# Patient Record
Sex: Female | Born: 1965 | Race: White | Hispanic: No | Marital: Married | Smoking: Former smoker
Health system: Southern US, Community
[De-identification: ages and names within clinical notes are randomized; demographics above are authoritative.]

## PROBLEM LIST (undated history)

## (undated) DIAGNOSIS — C50919 Malignant neoplasm of unspecified site of unspecified female breast: Secondary | ICD-10-CM

## (undated) DIAGNOSIS — F419 Anxiety disorder, unspecified: Secondary | ICD-10-CM

## (undated) DIAGNOSIS — I639 Cerebral infarction, unspecified: Secondary | ICD-10-CM

## (undated) DIAGNOSIS — I10 Essential (primary) hypertension: Secondary | ICD-10-CM

## (undated) DIAGNOSIS — Z923 Personal history of irradiation: Secondary | ICD-10-CM

## (undated) DIAGNOSIS — K5790 Diverticulosis of intestine, part unspecified, without perforation or abscess without bleeding: Secondary | ICD-10-CM

## (undated) DIAGNOSIS — N200 Calculus of kidney: Secondary | ICD-10-CM

## (undated) DIAGNOSIS — K5090 Crohn's disease, unspecified, without complications: Secondary | ICD-10-CM

## (undated) DIAGNOSIS — C801 Malignant (primary) neoplasm, unspecified: Secondary | ICD-10-CM

## (undated) DIAGNOSIS — F32A Depression, unspecified: Secondary | ICD-10-CM

## (undated) DIAGNOSIS — R112 Nausea with vomiting, unspecified: Secondary | ICD-10-CM

## (undated) DIAGNOSIS — T7840XA Allergy, unspecified, initial encounter: Secondary | ICD-10-CM

## (undated) DIAGNOSIS — Z9889 Other specified postprocedural states: Secondary | ICD-10-CM

## (undated) DIAGNOSIS — J4520 Mild intermittent asthma, uncomplicated: Secondary | ICD-10-CM

## (undated) DIAGNOSIS — K529 Noninfective gastroenteritis and colitis, unspecified: Secondary | ICD-10-CM

## (undated) DIAGNOSIS — E785 Hyperlipidemia, unspecified: Secondary | ICD-10-CM

## (undated) DIAGNOSIS — F329 Major depressive disorder, single episode, unspecified: Secondary | ICD-10-CM

## (undated) HISTORY — DX: Crohn's disease, unspecified, without complications: K50.90

## (undated) HISTORY — PX: RENAL ARTERY STENT: SHX2321

## (undated) HISTORY — DX: Calculus of kidney: N20.0

## (undated) HISTORY — PX: LITHOTRIPSY: SUR834

## (undated) HISTORY — DX: Hyperlipidemia, unspecified: E78.5

## (undated) HISTORY — DX: Noninfective gastroenteritis and colitis, unspecified: K52.9

## (undated) HISTORY — DX: Essential (primary) hypertension: I10

## (undated) HISTORY — DX: Diverticulosis of intestine, part unspecified, without perforation or abscess without bleeding: K57.90

## (undated) HISTORY — DX: Depression, unspecified: F32.A

## (undated) HISTORY — DX: Malignant neoplasm of unspecified site of unspecified female breast: C50.919

## (undated) HISTORY — PX: LEEP: SHX91

## (undated) HISTORY — PX: CYSTOSCOPY W/ URETEROSCOPY W/ LITHOTRIPSY: SUR380

## (undated) HISTORY — DX: Cerebral infarction, unspecified: I63.9

## (undated) HISTORY — PX: COMBINED HYSTEROSCOPY DIAGNOSTIC / D&C: SUR297

## (undated) HISTORY — DX: Allergy, unspecified, initial encounter: T78.40XA

## (undated) HISTORY — PX: WISDOM TOOTH EXTRACTION: SHX21

## (undated) HISTORY — DX: Major depressive disorder, single episode, unspecified: F32.9

## (undated) HISTORY — DX: Mild intermittent asthma, uncomplicated: J45.20

## (undated) HISTORY — DX: Anxiety disorder, unspecified: F41.9

---

## 1998-09-14 ENCOUNTER — Other Ambulatory Visit: Admission: RE | Admit: 1998-09-14 | Discharge: 1998-09-14 | Payer: Self-pay | Admitting: Obstetrics and Gynecology

## 2001-05-22 ENCOUNTER — Other Ambulatory Visit: Admission: RE | Admit: 2001-05-22 | Discharge: 2001-05-22 | Payer: Self-pay | Admitting: Obstetrics & Gynecology

## 2001-05-24 ENCOUNTER — Encounter (INDEPENDENT_AMBULATORY_CARE_PROVIDER_SITE_OTHER): Payer: Self-pay

## 2001-05-24 ENCOUNTER — Ambulatory Visit (HOSPITAL_COMMUNITY): Admission: RE | Admit: 2001-05-24 | Discharge: 2001-05-24 | Payer: Self-pay | Admitting: Obstetrics & Gynecology

## 2001-10-30 ENCOUNTER — Other Ambulatory Visit: Admission: RE | Admit: 2001-10-30 | Discharge: 2001-10-30 | Payer: Self-pay | Admitting: Obstetrics & Gynecology

## 2002-02-21 ENCOUNTER — Inpatient Hospital Stay (HOSPITAL_COMMUNITY): Admission: AD | Admit: 2002-02-21 | Discharge: 2002-02-21 | Payer: Self-pay | Admitting: Obstetrics & Gynecology

## 2002-03-21 ENCOUNTER — Inpatient Hospital Stay (HOSPITAL_COMMUNITY): Admission: AD | Admit: 2002-03-21 | Discharge: 2002-03-21 | Payer: Self-pay | Admitting: Obstetrics & Gynecology

## 2002-05-01 ENCOUNTER — Inpatient Hospital Stay (HOSPITAL_COMMUNITY): Admission: AD | Admit: 2002-05-01 | Discharge: 2002-05-04 | Payer: Self-pay | Admitting: Obstetrics & Gynecology

## 2002-06-05 ENCOUNTER — Other Ambulatory Visit: Admission: RE | Admit: 2002-06-05 | Discharge: 2002-06-05 | Payer: Self-pay | Admitting: Obstetrics and Gynecology

## 2003-07-01 ENCOUNTER — Other Ambulatory Visit: Admission: RE | Admit: 2003-07-01 | Discharge: 2003-07-01 | Payer: Self-pay | Admitting: Obstetrics & Gynecology

## 2004-02-11 ENCOUNTER — Encounter: Admission: RE | Admit: 2004-02-11 | Discharge: 2004-05-11 | Payer: Self-pay | Admitting: Obstetrics & Gynecology

## 2004-08-02 ENCOUNTER — Inpatient Hospital Stay (HOSPITAL_COMMUNITY): Admission: AD | Admit: 2004-08-02 | Discharge: 2004-08-04 | Payer: Self-pay | Admitting: Obstetrics & Gynecology

## 2004-09-12 ENCOUNTER — Other Ambulatory Visit: Admission: RE | Admit: 2004-09-12 | Discharge: 2004-09-12 | Payer: Self-pay | Admitting: Obstetrics & Gynecology

## 2005-03-06 ENCOUNTER — Ambulatory Visit: Payer: Self-pay | Admitting: Family Medicine

## 2005-11-20 ENCOUNTER — Other Ambulatory Visit: Admission: RE | Admit: 2005-11-20 | Discharge: 2005-11-20 | Payer: Self-pay | Admitting: Obstetrics & Gynecology

## 2005-12-26 ENCOUNTER — Ambulatory Visit: Payer: Self-pay | Admitting: Family Medicine

## 2006-01-02 ENCOUNTER — Ambulatory Visit: Payer: Self-pay | Admitting: Family Medicine

## 2006-01-22 ENCOUNTER — Ambulatory Visit: Payer: Self-pay | Admitting: Internal Medicine

## 2006-03-26 ENCOUNTER — Ambulatory Visit: Payer: Self-pay | Admitting: Family Medicine

## 2006-04-02 ENCOUNTER — Ambulatory Visit: Payer: Self-pay | Admitting: Family Medicine

## 2006-04-16 ENCOUNTER — Ambulatory Visit: Payer: Self-pay | Admitting: Family Medicine

## 2006-05-07 ENCOUNTER — Ambulatory Visit: Payer: Self-pay | Admitting: Family Medicine

## 2006-05-11 ENCOUNTER — Ambulatory Visit: Payer: Self-pay | Admitting: Family Medicine

## 2006-08-03 ENCOUNTER — Ambulatory Visit: Payer: Self-pay | Admitting: Internal Medicine

## 2006-08-03 ENCOUNTER — Observation Stay (HOSPITAL_COMMUNITY): Admission: AD | Admit: 2006-08-03 | Discharge: 2006-08-04 | Payer: Self-pay | Admitting: Internal Medicine

## 2006-08-06 ENCOUNTER — Ambulatory Visit: Payer: Self-pay | Admitting: Family Medicine

## 2006-08-13 ENCOUNTER — Ambulatory Visit: Payer: Self-pay | Admitting: Family Medicine

## 2006-08-20 ENCOUNTER — Ambulatory Visit: Payer: Self-pay | Admitting: Family Medicine

## 2006-09-03 ENCOUNTER — Ambulatory Visit: Payer: Self-pay | Admitting: Family Medicine

## 2006-09-17 ENCOUNTER — Ambulatory Visit: Payer: Self-pay | Admitting: Family Medicine

## 2006-10-01 ENCOUNTER — Ambulatory Visit: Payer: Self-pay | Admitting: Family Medicine

## 2006-12-31 ENCOUNTER — Ambulatory Visit: Payer: Self-pay | Admitting: Family Medicine

## 2006-12-31 LAB — CONVERTED CEMR LAB
ALT: 14 units/L (ref 0–40)
AST: 20 units/L (ref 0–37)
Albumin: 3.5 g/dL (ref 3.5–5.2)
Alkaline Phosphatase: 70 units/L (ref 39–117)
BUN: 13 mg/dL (ref 6–23)
CO2: 24 meq/L (ref 19–32)
Calcium: 8.6 mg/dL (ref 8.4–10.5)
Chloride: 103 meq/L (ref 96–112)
Creatinine, Ser: 0.8 mg/dL (ref 0.4–1.2)
GFR calc non Af Amer: 84 mL/min
Glomerular Filtration Rate, Af Am: 102 mL/min/{1.73_m2}
Glucose, Bld: 94 mg/dL (ref 70–99)
Hgb A1c MFr Bld: 5.4 % (ref 4.6–6.0)
Potassium: 3.8 meq/L (ref 3.5–5.1)
Sodium: 134 meq/L — ABNORMAL LOW (ref 135–145)
Total Bilirubin: 0.7 mg/dL (ref 0.3–1.2)
Total Protein: 6.9 g/dL (ref 6.0–8.3)

## 2007-02-12 ENCOUNTER — Ambulatory Visit: Payer: Self-pay | Admitting: Family Medicine

## 2007-02-26 ENCOUNTER — Ambulatory Visit: Payer: Self-pay | Admitting: Family Medicine

## 2007-04-12 ENCOUNTER — Ambulatory Visit: Payer: Self-pay | Admitting: Family Medicine

## 2007-05-15 DIAGNOSIS — I10 Essential (primary) hypertension: Secondary | ICD-10-CM | POA: Insufficient documentation

## 2007-06-17 ENCOUNTER — Telehealth (INDEPENDENT_AMBULATORY_CARE_PROVIDER_SITE_OTHER): Payer: Self-pay | Admitting: *Deleted

## 2007-08-15 ENCOUNTER — Ambulatory Visit: Payer: Self-pay | Admitting: Family Medicine

## 2007-08-15 ENCOUNTER — Telehealth: Payer: Self-pay | Admitting: Family Medicine

## 2007-08-15 DIAGNOSIS — R002 Palpitations: Secondary | ICD-10-CM | POA: Insufficient documentation

## 2007-08-15 DIAGNOSIS — R5383 Other fatigue: Secondary | ICD-10-CM

## 2007-08-15 DIAGNOSIS — R5381 Other malaise: Secondary | ICD-10-CM | POA: Insufficient documentation

## 2007-08-15 DIAGNOSIS — J209 Acute bronchitis, unspecified: Secondary | ICD-10-CM | POA: Insufficient documentation

## 2007-08-15 DIAGNOSIS — F411 Generalized anxiety disorder: Secondary | ICD-10-CM | POA: Insufficient documentation

## 2007-08-16 ENCOUNTER — Telehealth (INDEPENDENT_AMBULATORY_CARE_PROVIDER_SITE_OTHER): Payer: Self-pay | Admitting: *Deleted

## 2007-09-27 ENCOUNTER — Telehealth (INDEPENDENT_AMBULATORY_CARE_PROVIDER_SITE_OTHER): Payer: Self-pay | Admitting: *Deleted

## 2007-10-08 ENCOUNTER — Ambulatory Visit: Payer: Self-pay | Admitting: Family Medicine

## 2007-10-08 ENCOUNTER — Encounter: Payer: Self-pay | Admitting: Internal Medicine

## 2007-10-11 LAB — CONVERTED CEMR LAB
ALT: 17 units/L (ref 0–35)
AST: 17 units/L (ref 0–37)
Albumin: 3.4 g/dL — ABNORMAL LOW (ref 3.5–5.2)
Alkaline Phosphatase: 59 units/L (ref 39–117)
Bilirubin, Direct: 0.1 mg/dL (ref 0.0–0.3)
Total Bilirubin: 0.5 mg/dL (ref 0.3–1.2)
Total Protein: 6.8 g/dL (ref 6.0–8.3)

## 2007-10-16 ENCOUNTER — Telehealth (INDEPENDENT_AMBULATORY_CARE_PROVIDER_SITE_OTHER): Payer: Self-pay | Admitting: *Deleted

## 2007-11-05 ENCOUNTER — Ambulatory Visit: Payer: Self-pay | Admitting: Family Medicine

## 2007-11-05 DIAGNOSIS — E785 Hyperlipidemia, unspecified: Secondary | ICD-10-CM | POA: Insufficient documentation

## 2007-11-19 ENCOUNTER — Ambulatory Visit: Payer: Self-pay | Admitting: Family Medicine

## 2007-12-13 ENCOUNTER — Telehealth (INDEPENDENT_AMBULATORY_CARE_PROVIDER_SITE_OTHER): Payer: Self-pay | Admitting: *Deleted

## 2008-02-06 ENCOUNTER — Ambulatory Visit: Payer: Self-pay | Admitting: Family Medicine

## 2008-02-11 ENCOUNTER — Encounter: Payer: Self-pay | Admitting: Family Medicine

## 2008-02-18 ENCOUNTER — Telehealth (INDEPENDENT_AMBULATORY_CARE_PROVIDER_SITE_OTHER): Payer: Self-pay | Admitting: *Deleted

## 2008-02-18 ENCOUNTER — Ambulatory Visit: Payer: Self-pay | Admitting: Family Medicine

## 2008-02-18 DIAGNOSIS — S335XXA Sprain of ligaments of lumbar spine, initial encounter: Secondary | ICD-10-CM | POA: Insufficient documentation

## 2008-02-19 ENCOUNTER — Encounter: Payer: Self-pay | Admitting: Family Medicine

## 2008-02-27 ENCOUNTER — Ambulatory Visit: Payer: Self-pay | Admitting: Family Medicine

## 2008-03-03 ENCOUNTER — Telehealth (INDEPENDENT_AMBULATORY_CARE_PROVIDER_SITE_OTHER): Payer: Self-pay | Admitting: *Deleted

## 2008-03-12 ENCOUNTER — Telehealth (INDEPENDENT_AMBULATORY_CARE_PROVIDER_SITE_OTHER): Payer: Self-pay | Admitting: *Deleted

## 2008-03-22 ENCOUNTER — Encounter: Admission: RE | Admit: 2008-03-22 | Discharge: 2008-03-22 | Payer: Self-pay | Admitting: Family Medicine

## 2008-03-23 ENCOUNTER — Telehealth (INDEPENDENT_AMBULATORY_CARE_PROVIDER_SITE_OTHER): Payer: Self-pay | Admitting: *Deleted

## 2008-04-07 ENCOUNTER — Encounter (INDEPENDENT_AMBULATORY_CARE_PROVIDER_SITE_OTHER): Payer: Self-pay | Admitting: *Deleted

## 2008-04-16 ENCOUNTER — Telehealth (INDEPENDENT_AMBULATORY_CARE_PROVIDER_SITE_OTHER): Payer: Self-pay | Admitting: *Deleted

## 2008-04-20 ENCOUNTER — Ambulatory Visit: Payer: Self-pay | Admitting: Family Medicine

## 2008-04-22 LAB — CONVERTED CEMR LAB: TSH: 4.16 microintl units/mL (ref 0.35–5.50)

## 2008-04-23 ENCOUNTER — Encounter (INDEPENDENT_AMBULATORY_CARE_PROVIDER_SITE_OTHER): Payer: Self-pay | Admitting: *Deleted

## 2008-05-27 ENCOUNTER — Telehealth (INDEPENDENT_AMBULATORY_CARE_PROVIDER_SITE_OTHER): Payer: Self-pay | Admitting: *Deleted

## 2008-06-15 ENCOUNTER — Encounter: Admission: RE | Admit: 2008-06-15 | Discharge: 2008-06-15 | Payer: Self-pay | Admitting: Psychiatry

## 2008-07-06 ENCOUNTER — Telehealth (INDEPENDENT_AMBULATORY_CARE_PROVIDER_SITE_OTHER): Payer: Self-pay | Admitting: *Deleted

## 2008-07-29 ENCOUNTER — Ambulatory Visit: Payer: Self-pay | Admitting: Family Medicine

## 2008-07-29 DIAGNOSIS — Z87442 Personal history of urinary calculi: Secondary | ICD-10-CM | POA: Insufficient documentation

## 2008-08-14 ENCOUNTER — Telehealth (INDEPENDENT_AMBULATORY_CARE_PROVIDER_SITE_OTHER): Payer: Self-pay | Admitting: *Deleted

## 2008-08-19 ENCOUNTER — Ambulatory Visit: Payer: Self-pay | Admitting: Family Medicine

## 2008-08-26 LAB — CONVERTED CEMR LAB
ALT: 26 units/L (ref 0–35)
AST: 28 units/L (ref 0–37)
Albumin: 3.4 g/dL — ABNORMAL LOW (ref 3.5–5.2)
Alkaline Phosphatase: 64 units/L (ref 39–117)
BUN: 17 mg/dL (ref 6–23)
Bilirubin, Direct: 0.1 mg/dL (ref 0.0–0.3)
CO2: 27 meq/L (ref 19–32)
Calcium: 8.8 mg/dL (ref 8.4–10.5)
Chloride: 110 meq/L (ref 96–112)
Cholesterol: 149 mg/dL (ref 0–200)
Creatinine, Ser: 0.7 mg/dL (ref 0.4–1.2)
GFR calc Af Amer: 119 mL/min
GFR calc non Af Amer: 98 mL/min
Glucose, Bld: 94 mg/dL (ref 70–99)
HDL: 48.7 mg/dL (ref >39.0)
LDL Cholesterol: 64 mg/dL (ref 0–99)
Potassium: 4.2 meq/L (ref 3.5–5.1)
Sodium: 140 meq/L (ref 135–145)
Total Bilirubin: 0.6 mg/dL (ref 0.3–1.2)
Total CHOL/HDL Ratio: 3.1
Total Protein: 6.8 g/dL (ref 6.0–8.3)
Triglycerides: 183 mg/dL — ABNORMAL HIGH (ref 0–149)
VLDL: 37 mg/dL (ref 0–40)
Vitamin B-12: >1500 pg/mL — ABNORMAL HIGH (ref 211–911)

## 2008-08-27 ENCOUNTER — Encounter (INDEPENDENT_AMBULATORY_CARE_PROVIDER_SITE_OTHER): Payer: Self-pay | Admitting: *Deleted

## 2009-02-10 ENCOUNTER — Encounter: Admission: RE | Admit: 2009-02-10 | Discharge: 2009-02-10 | Payer: Self-pay | Admitting: Obstetrics & Gynecology

## 2009-03-09 ENCOUNTER — Ambulatory Visit: Payer: Self-pay | Admitting: Family Medicine

## 2009-03-09 DIAGNOSIS — E538 Deficiency of other specified B group vitamins: Secondary | ICD-10-CM | POA: Insufficient documentation

## 2009-03-09 DIAGNOSIS — J019 Acute sinusitis, unspecified: Secondary | ICD-10-CM | POA: Insufficient documentation

## 2009-03-10 ENCOUNTER — Ambulatory Visit: Payer: Self-pay | Admitting: Family Medicine

## 2009-03-31 LAB — CONVERTED CEMR LAB
ALT: 18 units/L (ref 0–35)
AST: 21 units/L (ref 0–37)
Albumin: 3.5 g/dL (ref 3.5–5.2)
Alkaline Phosphatase: 70 units/L (ref 39–117)
BUN: 15 mg/dL (ref 6–23)
Basophils Absolute: 0.1 10*3/uL (ref 0.0–0.1)
Basophils Relative: 0.7 % (ref 0.0–3.0)
Bilirubin, Direct: 0 mg/dL (ref 0.0–0.3)
CO2: 27 meq/L (ref 19–32)
Calcium: 9 mg/dL (ref 8.4–10.5)
Chloride: 106 meq/L (ref 96–112)
Cholesterol: 126 mg/dL (ref 0–200)
Creatinine, Ser: 0.8 mg/dL (ref 0.4–1.2)
Eosinophils Absolute: 0.3 10*3/uL (ref 0.0–0.7)
Eosinophils Relative: 3.2 % (ref 0.0–5.0)
Folate: 12.6 ng/mL
GFR calc non Af Amer: 83.42 mL/min (ref >60)
Glucose, Bld: 100 mg/dL — ABNORMAL HIGH (ref 70–99)
HCT: 39.9 % (ref 36.0–46.0)
HDL: 46.9 mg/dL (ref >39.00)
Hemoglobin: 13.6 g/dL (ref 12.0–15.0)
LDL Cholesterol: 43 mg/dL (ref 0–99)
Lymphocytes Relative: 17.5 % (ref 12.0–46.0)
Lymphs Abs: 1.6 10*3/uL (ref 0.7–4.0)
MCHC: 34 g/dL (ref 30.0–36.0)
MCV: 84.3 fL (ref 78.0–100.0)
Monocytes Absolute: 0.6 10*3/uL (ref 0.1–1.0)
Monocytes Relative: 6.6 % (ref 3.0–12.0)
Neutro Abs: 6.3 10*3/uL (ref 1.4–7.7)
Neutrophils Relative %: 72 % (ref 43.0–77.0)
Platelets: 234 10*3/uL (ref 150.0–400.0)
Potassium: 4.2 meq/L (ref 3.5–5.1)
RBC: 4.73 M/uL (ref 3.87–5.11)
RDW: 12.5 % (ref 11.5–14.6)
Sodium: 140 meq/L (ref 135–145)
Total Bilirubin: 0.6 mg/dL (ref 0.3–1.2)
Total CHOL/HDL Ratio: 3
Total Protein: 6.9 g/dL (ref 6.0–8.3)
Triglycerides: 181 mg/dL — ABNORMAL HIGH (ref 0.0–149.0)
VLDL: 36.2 mg/dL (ref 0.0–40.0)
Vitamin B-12: 967 pg/mL — ABNORMAL HIGH (ref 211–911)
WBC: 8.9 10*3/uL (ref 4.5–10.5)

## 2009-04-01 ENCOUNTER — Encounter (INDEPENDENT_AMBULATORY_CARE_PROVIDER_SITE_OTHER): Payer: Self-pay | Admitting: *Deleted

## 2009-09-29 ENCOUNTER — Encounter (INDEPENDENT_AMBULATORY_CARE_PROVIDER_SITE_OTHER): Payer: Self-pay | Admitting: *Deleted

## 2009-10-19 ENCOUNTER — Ambulatory Visit: Payer: Self-pay | Admitting: Family Medicine

## 2009-10-21 ENCOUNTER — Telehealth (INDEPENDENT_AMBULATORY_CARE_PROVIDER_SITE_OTHER): Payer: Self-pay | Admitting: *Deleted

## 2009-10-25 ENCOUNTER — Ambulatory Visit: Payer: Self-pay | Admitting: Family Medicine

## 2009-10-25 LAB — CONVERTED CEMR LAB
ALT: 18 units/L (ref 0–35)
AST: 23 units/L (ref 0–37)
Albumin: 3.4 g/dL — ABNORMAL LOW (ref 3.5–5.2)
Alkaline Phosphatase: 59 units/L (ref 39–117)
Bilirubin, Direct: 0 mg/dL (ref 0.0–0.3)
Cholesterol: 137 mg/dL (ref 0–200)
HDL: 48.7 mg/dL (ref >39.00)
LDL Cholesterol: 65 mg/dL (ref 0–99)
Total Bilirubin: 0.6 mg/dL (ref 0.3–1.2)
Total CHOL/HDL Ratio: 3
Total Protein: 6.9 g/dL (ref 6.0–8.3)
Triglycerides: 117 mg/dL (ref 0.0–149.0)
VLDL: 23.4 mg/dL (ref 0.0–40.0)

## 2009-12-08 LAB — HM PAP SMEAR

## 2009-12-13 ENCOUNTER — Ambulatory Visit (HOSPITAL_BASED_OUTPATIENT_CLINIC_OR_DEPARTMENT_OTHER): Admission: RE | Admit: 2009-12-13 | Discharge: 2009-12-13 | Payer: Self-pay | Admitting: Family Medicine

## 2009-12-13 ENCOUNTER — Ambulatory Visit: Payer: Self-pay | Admitting: Interventional Radiology

## 2009-12-13 ENCOUNTER — Ambulatory Visit: Payer: Self-pay | Admitting: Family Medicine

## 2009-12-13 DIAGNOSIS — M546 Pain in thoracic spine: Secondary | ICD-10-CM | POA: Insufficient documentation

## 2009-12-13 LAB — CONVERTED CEMR LAB
Bilirubin Urine: NEGATIVE
Glucose, Urine, Semiquant: NEGATIVE
Ketones, urine, test strip: NEGATIVE
Nitrite: NEGATIVE
Specific Gravity, Urine: 1.01
Urobilinogen, UA: 0.2
WBC Urine, dipstick: NEGATIVE
pH: 7

## 2009-12-14 ENCOUNTER — Telehealth (INDEPENDENT_AMBULATORY_CARE_PROVIDER_SITE_OTHER): Payer: Self-pay | Admitting: *Deleted

## 2010-04-01 ENCOUNTER — Ambulatory Visit: Payer: Self-pay | Admitting: Family Medicine

## 2010-04-01 DIAGNOSIS — M549 Dorsalgia, unspecified: Secondary | ICD-10-CM | POA: Insufficient documentation

## 2010-04-28 ENCOUNTER — Telehealth (INDEPENDENT_AMBULATORY_CARE_PROVIDER_SITE_OTHER): Payer: Self-pay | Admitting: *Deleted

## 2010-05-03 ENCOUNTER — Ambulatory Visit: Payer: Self-pay | Admitting: Family Medicine

## 2010-05-05 ENCOUNTER — Ambulatory Visit: Payer: Self-pay | Admitting: Family Medicine

## 2010-05-05 LAB — CONVERTED CEMR LAB
ALT: 17 units/L (ref 0–35)
AST: 22 units/L (ref 0–37)
Albumin: 3.5 g/dL (ref 3.5–5.2)
Alkaline Phosphatase: 67 units/L (ref 39–117)
BUN: 17 mg/dL (ref 6–23)
Bilirubin, Direct: 0.1 mg/dL (ref 0.0–0.3)
CO2: 27 meq/L (ref 19–32)
Calcium: 9.2 mg/dL (ref 8.4–10.5)
Chloride: 104 meq/L (ref 96–112)
Cholesterol: 228 mg/dL — ABNORMAL HIGH (ref 0–200)
Creatinine, Ser: 0.7 mg/dL (ref 0.4–1.2)
Direct LDL: 156 mg/dL
GFR calc non Af Amer: 98.42 mL/min (ref >60)
Glucose, Bld: 102 mg/dL — ABNORMAL HIGH (ref 70–99)
HDL: 56.4 mg/dL (ref >39.00)
Potassium: 4.6 meq/L (ref 3.5–5.1)
Sodium: 142 meq/L (ref 135–145)
Total Bilirubin: 0.6 mg/dL (ref 0.3–1.2)
Total CHOL/HDL Ratio: 4
Total Protein: 6.9 g/dL (ref 6.0–8.3)
Triglycerides: 144 mg/dL (ref 0.0–149.0)
VLDL: 28.8 mg/dL (ref 0.0–40.0)

## 2010-08-08 ENCOUNTER — Telehealth (INDEPENDENT_AMBULATORY_CARE_PROVIDER_SITE_OTHER): Payer: Self-pay | Admitting: *Deleted

## 2010-08-10 LAB — HM MAMMOGRAPHY

## 2010-08-23 ENCOUNTER — Ambulatory Visit: Payer: Self-pay | Admitting: Family Medicine

## 2010-08-24 LAB — CONVERTED CEMR LAB
ALT: 13 units/L (ref 0–35)
AST: 18 units/L (ref 0–37)
Albumin: 3.5 g/dL (ref 3.5–5.2)
Alkaline Phosphatase: 69 units/L (ref 39–117)
BUN: 21 mg/dL (ref 6–23)
Bilirubin, Direct: 0.1 mg/dL (ref 0.0–0.3)
CO2: 26 meq/L (ref 19–32)
Calcium: 9.2 mg/dL (ref 8.4–10.5)
Chloride: 103 meq/L (ref 96–112)
Cholesterol: 163 mg/dL (ref 0–200)
Creatinine, Ser: 0.7 mg/dL (ref 0.4–1.2)
GFR calc non Af Amer: 92.09 mL/min (ref >60)
Glucose, Bld: 102 mg/dL — ABNORMAL HIGH (ref 70–99)
HDL: 51.8 mg/dL (ref >39.00)
LDL Cholesterol: 84 mg/dL (ref 0–99)
Potassium: 5 meq/L (ref 3.5–5.1)
Sodium: 138 meq/L (ref 135–145)
Total Bilirubin: 0.7 mg/dL (ref 0.3–1.2)
Total CHOL/HDL Ratio: 3
Total Protein: 6.7 g/dL (ref 6.0–8.3)
Triglycerides: 137 mg/dL (ref 0.0–149.0)
VLDL: 27.4 mg/dL (ref 0.0–40.0)

## 2010-09-06 ENCOUNTER — Ambulatory Visit: Payer: Self-pay | Admitting: Family Medicine

## 2010-09-07 ENCOUNTER — Encounter: Payer: Self-pay | Admitting: Family Medicine

## 2010-09-19 ENCOUNTER — Ambulatory Visit: Payer: Self-pay | Admitting: Family Medicine

## 2010-09-19 DIAGNOSIS — Z889 Allergy status to unspecified drugs, medicaments and biological substances status: Secondary | ICD-10-CM | POA: Insufficient documentation

## 2010-09-19 DIAGNOSIS — R300 Dysuria: Secondary | ICD-10-CM | POA: Insufficient documentation

## 2010-09-19 LAB — CONVERTED CEMR LAB
Bilirubin Urine: NEGATIVE
Glucose, Urine, Semiquant: NEGATIVE
Ketones, urine, test strip: NEGATIVE
Nitrite: NEGATIVE
Protein, U semiquant: >=300
Specific Gravity, Urine: 1.025
Urobilinogen, UA: 0.2
pH: 5

## 2010-09-23 ENCOUNTER — Telehealth (INDEPENDENT_AMBULATORY_CARE_PROVIDER_SITE_OTHER): Payer: Self-pay | Admitting: *Deleted

## 2010-10-13 ENCOUNTER — Telehealth (INDEPENDENT_AMBULATORY_CARE_PROVIDER_SITE_OTHER): Payer: Self-pay | Admitting: *Deleted

## 2010-11-24 ENCOUNTER — Ambulatory Visit: Payer: Self-pay | Admitting: Family Medicine

## 2010-11-24 DIAGNOSIS — B353 Tinea pedis: Secondary | ICD-10-CM | POA: Insufficient documentation

## 2011-01-13 ENCOUNTER — Telehealth (INDEPENDENT_AMBULATORY_CARE_PROVIDER_SITE_OTHER): Payer: Self-pay | Admitting: *Deleted

## 2011-01-22 LAB — CONVERTED CEMR LAB
ALT: 18 units/L (ref 0–35)
AST: 21 units/L (ref 0–37)
Albumin: 3.4 g/dL — ABNORMAL LOW (ref 3.5–5.2)
Alkaline Phosphatase: 65 units/L (ref 39–117)
BUN: 13 mg/dL (ref 6–23)
BUN: 14 mg/dL (ref 6–23)
Basophils Absolute: 0 10*3/uL (ref 0.0–0.1)
Basophils Absolute: 0.1 10*3/uL (ref 0.0–0.1)
Basophils Relative: 0.5 % (ref 0.0–3.0)
Basophils Relative: 1.5 % — ABNORMAL HIGH (ref 0.0–1.0)
Bilirubin Urine: NEGATIVE
Bilirubin, Direct: 0.1 mg/dL (ref 0.0–0.3)
CO2: 24 meq/L (ref 19–32)
CO2: 26 meq/L (ref 19–32)
Calcium: 8.9 mg/dL (ref 8.4–10.5)
Calcium: 9.4 mg/dL (ref 8.4–10.5)
Chloride: 104 meq/L (ref 96–112)
Chloride: 107 meq/L (ref 96–112)
Creatinine, Ser: 0.7 mg/dL (ref 0.4–1.2)
Creatinine, Ser: 0.8 mg/dL (ref 0.4–1.2)
Eosinophils Absolute: 0.1 10*3/uL (ref 0.0–0.6)
Eosinophils Absolute: 0.2 10*3/uL (ref 0.0–0.7)
Eosinophils Relative: 1.4 % (ref 0.0–5.0)
Eosinophils Relative: 1.8 % (ref 0.0–5.0)
Folate: 14.2 ng/mL
Folate: 9.9 ng/mL
GFR calc Af Amer: 102 mL/min
GFR calc Af Amer: 119 mL/min
GFR calc non Af Amer: 84 mL/min
GFR calc non Af Amer: 99 mL/min
Glucose, Bld: 94 mg/dL (ref 70–99)
Glucose, Bld: 95 mg/dL (ref 70–99)
Glucose, Urine, Semiquant: NEGATIVE
HCT: 40.5 % (ref 36.0–46.0)
HCT: 41.1 % (ref 36.0–46.0)
Hemoglobin: 13.7 g/dL (ref 12.0–15.0)
Hemoglobin: 13.9 g/dL (ref 12.0–15.0)
Iron: 101 ug/dL (ref 42–145)
Ketones, urine, test strip: NEGATIVE
Lymphocytes Relative: 18.3 % (ref 12.0–46.0)
Lymphocytes Relative: 27.3 % (ref 12.0–46.0)
Lymphs Abs: 2.3 10*3/uL (ref 0.7–4.0)
MCHC: 33.7 g/dL (ref 30.0–36.0)
MCHC: 33.9 g/dL (ref 30.0–36.0)
MCV: 85.6 fL (ref 78.0–100.0)
MCV: 89.6 fL (ref 78.0–100.0)
Monocytes Absolute: 0.2 10*3/uL (ref 0.2–0.7)
Monocytes Absolute: 0.7 10*3/uL (ref 0.1–1.0)
Monocytes Relative: 2.5 % — ABNORMAL LOW (ref 3.0–11.0)
Monocytes Relative: 7.8 % (ref 3.0–12.0)
Neutro Abs: 5.3 10*3/uL (ref 1.4–7.7)
Neutro Abs: 6.6 10*3/uL (ref 1.4–7.7)
Neutrophils Relative %: 62.6 % (ref 43.0–77.0)
Neutrophils Relative %: 76.3 % (ref 43.0–77.0)
Nitrite: NEGATIVE
Pap Smear: NORMAL
Platelets: 255 10*3/uL (ref 150.0–400.0)
Platelets: 311 10*3/uL (ref 150–400)
Potassium: 4 meq/L (ref 3.5–5.1)
Potassium: 4 meq/L (ref 3.5–5.1)
Protein, U semiquant: 100
RBC: 4.53 M/uL (ref 3.87–5.11)
RBC: 4.8 M/uL (ref 3.87–5.11)
RDW: 12.4 % (ref 11.5–14.6)
RDW: 13.9 % (ref 11.5–14.6)
Saturation Ratios: 18.3 % — ABNORMAL LOW (ref 20.0–50.0)
Sodium: 138 meq/L (ref 135–145)
Sodium: 138 meq/L (ref 135–145)
Specific Gravity, Urine: 1.025
TSH: 2.84 microintl units/mL (ref 0.35–5.50)
TSH: 3.15 microintl units/mL (ref 0.35–5.50)
Total Bilirubin: 0.6 mg/dL (ref 0.3–1.2)
Total Protein: 6.8 g/dL (ref 6.0–8.3)
Transferrin: 394.2 mg/dL — ABNORMAL HIGH (ref 212.0–360.0)
Urobilinogen, UA: NEGATIVE
Vitamin B-12: 254 pg/mL (ref 211–911)
Vitamin B-12: 762 pg/mL (ref 211–911)
WBC Urine, dipstick: NEGATIVE
WBC: 8.5 10*3/uL (ref 4.5–10.5)
WBC: 8.6 10*3/uL (ref 4.5–10.5)
pH: 5

## 2011-01-26 NOTE — Medication Information (Signed)
Summary: [employer]   Imported By: Velora Heckler 02/21/2008 14:49:29  _____________________________________________________________________  External Attachment:    Type:   Image     Comment:   External Document

## 2011-01-26 NOTE — Progress Notes (Signed)
Summary: lowne-90 days scripts needed before insurance ends  Phone Note Refill Request Call back at Home Phone [phone]   Refills Requested: Medication #1:  zyrtec 37m   Supply Requested: 3 months   Notes: 1 daily  Medication #2:  SPIRONOLACTONE 25 MG  TABS 1 by mouth once daily   Supply Requested: 3 months  Medication #3:  WELLBUTRIN XL 300 MG TB24 1 by mouth once daily   Supply Requested: 3 months  Medication #4:  DIOVAN 320 MG TABS   Supply Requested: 3 months   Notes: 1 daily pt losing insurance needs 90 day scripts asap to get filled before the end of the month. please call pt to come in for pickup  Initial call taken by: AShaune Leeks  September 27, 2007 1:02 PM  Follow-up for Phone Call        [place] NAME IS ON THE RX B/C DR.LOWNE IS OUT OF THE OFFICE. WILL GET DR.PAZ TO SIGN RXS'S AND SEND TO PATIENT. Follow-up by: CGeorgette Dover  October 01, 2007 12:30 PM    New/Updated Medications: ZYRTEC ALLERGY 10 MG  TABS (CETIRIZINE HCL) 1 by mouth once daily   Prescriptions: DIOVAN 320 MG TABS (VALSARTAN)   #90 x 0   Entered by:   CGeorgette Dover  Authorized by:   JAlda Berthold Paz MD   Signed by:   CMalachi Bondson 10/01/2007   Method used:   Print then Give to Patient   RxID:   [id number] 25 MG  TABS (SPIRONOLACTONE) 1 by mouth once daily  #90 x 0   Entered by:   CGeorgette Dover  Authorized by:   JAlda Berthold Paz MD   Signed by:   CMalachi Bondson 10/01/2007   Method used:   Print then Give to Patient   RxID:   [id number] XL 300 MG TB24 (BUPROPION HCL) 1 by mouth once daily  #90 x 0   Entered by:   CGeorgette Dover  Authorized by:   JAlda Berthold Paz MD   Signed by:   CMalachi Bondson 10/01/2007   Method used:   Print then Give to Patient   RxID:   [id number]

## 2011-01-26 NOTE — Letter (Signed)
Summary: Primary Care Consult Scheduled Letter  [facility]   [name], [name] [address]   Phone: [phone]  Fax: [phone]      04/07/2008 MRN: [id number]  [name] [name], [name]  [address]    Dear [name],      We have scheduled an appointment for you.  At the recommendation of Dr.LOWNE, we have scheduled you a consult with DR BEANE Volga [facility] on 04.21.09 @ 10:00.  Their phone number is [phone].  If this appointment day and time is not convenient for you, please feel free to call the office of the doctor you are being referred to at the number listed above and reschedule the appointment.     It is important for you to keep your scheduled appointments. We are here to make sure you are given good patient care. If yu have questions or you have made changes to your appointment, please notify us at  [phone], ask for [facility].    Thank you,  Patient Care Coordinator [place] at [facility]

## 2011-01-26 NOTE — Progress Notes (Signed)
Summary: PT NEEDS HER CHOLESTEROL RESULTS  Phone Note Call from Patient Call back at [phone]   Caller: Patient Reason for Call: Talk to Nurse, Lab or Test Results Summary of Call: DR LOWNE// PT RECEIVED LABS OVER THE MAIL BUT DID NOT RECEIVE HER CHOLESTEROL RESULT. PT WANTS TO SPEAK WITH NURSE. Initial call taken by: Charlcie Cradle,  October 16, 2007 12:41 PM  Follow-up for Phone Call        d/w patient, she had a NMR which is not back yet. we will contact her when we receive report. patient ok'd instruction. Follow-up by: Georgette Dover,  October 16, 2007 12:50 PM

## 2011-01-26 NOTE — Progress Notes (Signed)
Summary: Refill Request  Phone Note Refill Request Call back at [phone] Message from:  Pharmacy on January 13, 2011 2:05 PM  Refills Requested: Medication #1:  ZOCOR 20 MG TABS 1 by mouth at bedtime.   Dosage confirmed as above?Dosage Confirmed   Brand Name Necessary? No   Supply Requested: 30   Last Refilled: 12/13/2010 [facility]  Next Appointment Scheduled: none Initial call taken by: Elna Breslow,  January 13, 2011 2:06 PM    Prescriptions: ZOCOR 20 MG TABS (SIMVASTATIN) 1 by mouth at bedtime  #30 x 4   Entered by:   Malachi Bonds CMA   Authorized by:   Garnet Koyanagi DO   Signed by:   Malachi Bonds CMA on 01/13/2011   Method used:   Electronically to        [facility] (retail)       [address]. [place], [place]  [address]       Ph: [phone]       Fax: [phone]   RxID:   [id number]

## 2011-01-26 NOTE — Progress Notes (Signed)
Summary: Imaging Results   Phone Note Outgoing Call   Summary of Call: LMTCB, Imaging Results:  normal spine she does have b/l kidney stones Initial call taken by: Allyn Kenner CMA,  December 14, 2009 8:40 AM  Follow-up for Phone Call        pt aware-- she will call if symptoms do not get better.  Follow-up by: Allyn Kenner CMA,  December 14, 2009 2:40 PM

## 2011-01-26 NOTE — Progress Notes (Signed)
Summary: LOWNE,Y-HAS QUESTION ABOUT LIPITOR  dr Etter Sjogren  Phone Note Call from Patient Call back at [facility] Phone [phone]   Caller: Patient Reason for Call: Talk to Nurse Summary of Call: [place].  WANTS TO KNOW IF SHE CAN SWITCH TO A GENERIC Initial call taken by: Shaune Leeks,  June 17, 2007 2:30 PM  Follow-up for Phone Call        There is no generic for Lipitor--- we would have to change meds to zocor--- which is differnt but we can do it.   zocor 40 mg 1 a day #30 2 refills. Follow-up by: Garnet Koyanagi DO,  June 17, 2007 5:18 PM  Additional Follow-up for Phone Call Additional follow up Details #1::        spoke with pt ins will pay for zocor (generic) wants rx called [facility] rx called in Additional Follow-up by: Verdie Mosher,  June 18, 2007 11:54 AM

## 2011-01-26 NOTE — Progress Notes (Signed)
  Phone Note Outgoing Call Call back at [facility] Phone [phone]   Call placed by: Carley Hammed,  March 23, 2008 5:13 PM Call placed to: Patient Summary of Call: Patient aware and would  like ortho referral ...................................................................Carley Hammed  March 23, 2008 5:14 PM Patient aware and ortho referral sent per Dr. Etter Sjogren.  ...................................................................Carley Hammed  March 23, 2008 5:15 PM

## 2011-01-26 NOTE — Assessment & Plan Note (Signed)
Summary: for back pain//ph   Vital Signs:  Patient profile:   45 year old female Height:      64 inches Weight:      204.50 pounds BMI:     35.23 Temp:     98.0 degrees F oral Pulse rate:   82 / minute Pulse rhythm:   regular BP sitting:   124 / 86  (left arm) Cuff size:   large  Vitals Entered By: Allyn Kenner CMA (December 13, 2009 10:44 AM) CC: Pt c/o of middle upper back pain, no problems urinating. Mainly bothers her at night x 2-3 months. , Back Pain   History of Present Illness:       This is a 45 year old woman who presents with Back Pain.  The symptoms began 2 months ago.  The patient denies fever, chills, weakness, loss of sensation, fecal incontinence, urinary incontinence, urinary retention, dysuria, rest pain, inability to work, and inability to care for self.  The pain is located in the mid thoracic back.  The pain began suddenly.  The pain is made worse by lying down.  The pain is made better by activity, NSAID medications, and heat.  Risk factors for serious underlying conditions include duration of pain > 1 month.  Pt has been seeing chiropractor--[facility].  Allergies: No Known Drug Allergies  Physical Exam  General:  Well-developed,well-nourished,in no acute distress; alert,appropriate and cooperative throughout examination Msk:  normal ROM, no joint tenderness, no joint swelling, no joint warmth, no redness over joints, no joint deformities, no joint instability, and no crepitation.  + muscle spasm  trap b/l R>L  Extremities:  No clubbing, cyanosis, edema, or deformity noted with normal full range of motion of all joints.   Neurologic:  strength normal in all extremities, gait normal, and DTRs symmetrical and normal.   Psych:  Oriented X3 and normally interactive.     Impression & Recommendations:  Problem # 1:  PAIN IN THORACIC SPINE (ICD-724.1)  The following medications were removed from the medication list:    Flexeril 5 Mg Tabs (Cyclobenzaprine  hcl) .Marland Kitchen... 1 by mouth three times a day Her updated medication list for this problem includes:    Flexeril 10 Mg Tabs (Cyclobenzaprine hcl) .Marland Kitchen... 1 by mouth three times a day  Orders: T-Thoracic Spine 2 Views [phone])  Discussed use of moist heat or ice, modified activities, medications, and stretching/strengthening exercises. Back care instructions given. To be seen in 2 weeks if no improvement; sooner if worsening of symptoms.   Complete Medication List: 1)  Spironolactone 25 Mg Tabs (Spironolactone) .Marland Kitchen.. 1 by mouth once daily 2)  Diovan 320 Mg Tabs (Valsartan) .Marland Kitchen.. 1 by mouth once daily 3)  Crestor 20 Mg Tabs (Rosuvastatin calcium) .... Take one-half tablet daily. 4)  Zyrtec Allergy 10 Mg Tabs (Cetirizine hcl) .Marland Kitchen.. 1 by mouth once daily 5)  Lovaza 1 Gm Caps (Omega-3-acid ethyl esters) .... Take 2 capsules in the morning and 2 capsules in the evening. 6)  Bcp  7)  Norvasc 5 Mg Tabs (Amlodipine besylate) .Marland Kitchen.. 1 by mouth at night. 8)  Vitamin B-12 1000 U  9)  Flexeril 10 Mg Tabs (Cyclobenzaprine hcl) .Marland Kitchen.. 1 by mouth three times a day Prescriptions: FLEXERIL 10 MG TABS (CYCLOBENZAPRINE HCL) 1 by mouth three times a day  #30 x 0   Entered and Authorized by:   Garnet Koyanagi DO   Signed by:   Garnet Koyanagi DO on 12/13/2009   Method used:  Electronically to        [facility] (retail)       [address]. [place], [place]  [address]       Ph: [phone]       Fax: [phone]   RxID:   [id number]   Laboratory Results   Urine Tests    Routine Urinalysis   Color: yellow Appearance: Clear Glucose: negative   (Normal Range: Negative) Bilirubin: negative   (Normal Range: Negative) Ketone: negative   (Normal Range: Negative) Spec. Gravity: 1.010   (Normal Range: 1.003-1.035) Blood: trace-lysed   (Normal Range: Negative) pH: 7.0   (Normal Range: 5.0-8.0) Protein: trace   (Normal Range: Negative) Urobilinogen: 0.2   (Normal Range: 0-1)  Nitrite: negative   (Normal Range: Negative) Leukocyte Esterace: negative   (Normal Range: Negative)    Comments: Allyn Kenner CMA  December 13, 2009 10:52 AM

## 2011-01-26 NOTE — Letter (Signed)
Summary: Primary Care Appointment Letter  [facility]   [place], [place] [address]   Phone: [phone]  Fax: [phone]    09/29/2009 MRN: [id number]  [name] [place], [place]  [address]  Dear Ms. Nazar,   Your Primary Care Physician Garnet Koyanagi DO has indicated that:    ____X___it is time to schedule an appointment for office visit and fasting labs before further refills can be given.    _______you missed your appointment on______ and need to call and          reschedule.    _______you need to have lab work done.    _______you need to schedule an appointment discuss lab or test results.    _______you need to call to reschedule your appointment that is                       scheduled on _________.     Please call our office as soon as possible. Our phone number is [phone]          _________. Please press option 1. Our office is open 8a-12noon and 1p-5p, Monday through Friday.     Thank you,    [place]

## 2011-01-26 NOTE — Progress Notes (Signed)
Summary: question about bp med DR Etter Sjogren SEE PLEASE  Phone Note Call from Patient Call back at Home Phone [phone]   Caller: Patient Summary of Call: pt was seen today and she said Dr.Lowne gave her a rx for a bp med and she want to know if she needs to take that in addition to the one she already take.  acll back # [id number] Initial call taken by: Vanessa Martinique,  August 15, 2007 4:13 PM  Follow-up for Phone Call        yes --take in addition to med--- I called pt and informed her of this Follow-up by: Garnet Koyanagi DO,  August 15, 2007 6:14 PM

## 2011-01-26 NOTE — Progress Notes (Signed)
Summary: rx for crestor higher dose- dr Etter Sjogren  Phone Note Call from Patient Call back at Home Phone [phone]   Caller: Patient Summary of Call: patient wants rx for crestor 49m -- she takes crestor 10 mg - [facility] is suggesting she cut pill to save cost - they are even sending her a pill cutter Initial call taken by: Carol Spring,  May 27, 2008 10:32 AM  Follow-up for Phone Call        Dr. LEtter Sjogren Is this okay with you? MCarley Hammed May 27, 2008 10:41 AM  Follow-up by: MCarley Hammed  May 27, 2008 10:41 AM  Additional Follow-up for Phone Call Additional follow up Details #1::        OK- 1/2 tablet 1x a day Additional Follow-up by: YGarnet KoyanagiDO,  May 27, 2008 11:46 AM    Additional Follow-up for Phone Call Additional follow up Details #2::    lEFT DETAILED MESSAGE FOR PATIENT RX SENT IN. MCarley Hammed May 27, 2008 12:00 PM  Follow-up by: MCarley Hammed  May 27, 2008 12:00 PM  New/Updated Medications: CRESTOR 20 MG  TABS (ROSUVASTATIN CALCIUM) TAKE ONE-HALF TABLET DAILY.   Prescriptions: CRESTOR 20 MG  TABS (ROSUVASTATIN CALCIUM) TAKE ONE-HALF TABLET DAILY.  #30 x 1   Entered by:   MCarley Hammed  Authorized by:   YGarnet KoyanagiDO   Signed by:   MCarley Hammedon 05/27/2008   Method used:   Electronically sent to ...       [facility]       [address] [place]  [address]      Ph: [phone]      Fax: [phone]  RxID:   [id number]

## 2011-01-26 NOTE — Progress Notes (Signed)
Summary: questions in ref to MRI/ Dr Etter Sjogren see this please  Phone Note Call from Patient Call back at Home Phone [phone]   Caller: Patient Summary of Call: patient wanted to think about having mri( back) back isnt any better she has a few questions re mri before scheduling Initial call taken by: Arbie Cookey Spring,  March 12, 2008 3:23 PM  Follow-up for Phone Call        left msg to call back iref to questions about mri please give questions ...................................................................Marland KitchenVerdie Mosher  March 12, 2008 4:21 PM Pt called in ref to MRI she said she is claustrophobic (I did tell her we can prescribe some med to help her relax)Pt wanted to ask is there any way she can have wiothout going through the tube?Dr Etter Sjogren I told pt not that I know of but will ask Dr Etter Sjogren, pt aware may not get back to until tomorrow call her on cell number [phone] Please advise...................................................................Marland KitchenVerdie Mosher  March 12, 2008 4:44 PM   Follow-up by: Verdie Mosher,  March 12, 2008 4:21 PM  Additional Follow-up for Phone Call Additional follow up Details #1::         We can schedule a open MRI Additional Follow-up by: Garnet Koyanagi DO,  March 12, 2008 5:45 PM    Additional Follow-up for Phone Call Additional follow up Details #2::    Left message for patient to call the office, that we will schedule an open MRI for her.  ...................................................................Carley Hammed  March 13, 2008 9:08 AM  Follow-up by: Carley Hammed,  March 13, 2008 9:08 AM

## 2011-01-26 NOTE — Progress Notes (Signed)
Summary: lab orders  Phone Note Call from Patient   Caller: Patient Summary of Call: Dr.Lowne   Call Back   [phone]  Pt wants a lab appt----need orders to get complete labs and her b-12 shot.                                  when you get orders to can send this back to me I will call the pt. Initial call taken by: Vanessa Martinique,  August 14, 2008 11:52 AM  Follow-up for Phone Call        272.4, 401.9   lipid, hep, bmp   266.9  B12/folate Follow-up by: Garnet Koyanagi DO,  August 14, 2008 3:54 PM  Additional Follow-up for Phone Call Additional follow up Details #1::        left pt a message to call back so I schedule her lab appt Additional Follow-up by: Vanessa Martinique,  August 17, 2008 10:57 AM    Additional Follow-up for Phone Call Additional follow up Details #2::    pt is aware and lab appt has been made..Vanessa Martinique  August 17, 2008 11:47 AM

## 2011-01-26 NOTE — Progress Notes (Signed)
Summary: PT NEEDS TO SPEAK WITH NURSE  Phone Note Call from Patient Call back at Home Phone [phone]   Caller: Patient Reason for Call: Talk to Nurse Summary of Call: DR LOWNE// PT [place] BP PROBLEMS.PT SAYS SHE JUST REALIZE THAT SHE HAS BEEN TAKING VITALITY MULTI VITAMINS.PT WANTS TO KNOW IF THE VITAMINS CAUSED THE PROBLEM OR IF SHE SHOULD KEEP TAKING IT Initial call taken by: Charlcie Cradle,  August 16, 2007 1:17 PM  Follow-up for Phone Call        Sp w/ pt says she has been taking a Multi-vitamin w/ [employer]--DID NOT TAKE IT TODAY.  Says she is feeling alot better no problems w/ her rapid heart rate-, and her b/p was 126/73 and pulse 79 ..................................................................Marland KitchenAllen Norris  August 16, 2007 2:30 PM  Follow-up by: Allen Norris,  August 16, 2007 2:30 PM  Additional Follow-up for Phone Call Additional follow up Details #1::        it's possible --  I'd have to see what was in it besides multivitamins--- Niacin could do that.  Additional Follow-up by: Garnet Koyanagi DO,  August 16, 2007 3:07 PM    Additional Follow-up for Phone Call Additional follow up Details #2::    lmom per dr Etter Sjogren it it possible, but she would have to see what is in the multivitamin NIacin could do that Follow-up by: Verdie Mosher,  August 19, 2007 12:05 PM

## 2011-01-26 NOTE — Progress Notes (Signed)
Summary: LOWNE--60 DAYS REFILL  Phone Note Refill Request Call back at Home Phone [phone]   Refills Requested: Medication #1:  WELLBUTRIN XL 300 MG TB24 1 by mouth once daily  Medication #2:  CRESTOR 10 MG  TABS 1 by mouth at bedtime  Medication #3:  TOPROL XL 25 MG  TB24 1 by mouth q day.  Medication #4:  SPIRONOLACTONE 25 MG  TABS 1 by mouth once daily DIVOAN 320 MG  FOR 60 DAY ALL HER MED /PICK UP ON MON  Initial call taken by: Velora Heckler,  December 13, 2007 2:59 PM  Follow-up for Phone Call        left message on machine ready for pick up Follow-up by: Malachi Bonds,  December 16, 2007 9:09 AM      Prescriptions: TOPROL XL 25 MG  TB24 (METOPROLOL SUCCINATE) 1 by mouth q day  #90 x 0   Entered by:   Malachi Bonds   Authorized by:   Alda Berthold. Paz MD   Signed by:   Malachi Bonds on 12/16/2007   Method used:   Print then Give to Patient   RxID:   [id number] CRESTOR 10 MG  TABS (ROSUVASTATIN CALCIUM) 1 by mouth at bedtime  #90 x 0   Entered by:   Malachi Bonds   Authorized by:   Alda Berthold. Paz MD   Signed by:   Malachi Bonds on 12/16/2007   Method used:   Print then Give to Patient   RxID:   [id number] DIOVAN 320 MG TABS (VALSARTAN) 1 by mouth once daily  #90 x 0   Entered by:   Malachi Bonds   Authorized by:   Alda Berthold. Paz MD   Signed by:   Malachi Bonds on 12/16/2007   Method used:   Print then Give to Patient   RxID:   [id number] SPIRONOLACTONE 25 MG  TABS (SPIRONOLACTONE) 1 by mouth once daily  #90 x 0   Entered by:   Malachi Bonds   Authorized by:   Alda Berthold. Paz MD   Signed by:   Malachi Bonds on 12/16/2007   Method used:   Print then Give to Patient   RxID:   [id number] WELLBUTRIN XL 300 MG TB24 (BUPROPION HCL) 1 by mouth once daily  #90 x 0   Entered by:   Malachi Bonds   Authorized by:   Alda Berthold. Paz MD   Signed by:   Malachi Bonds on 12/16/2007   Method used:   Print then Give to Patient   RxID:   [id number]

## 2011-01-26 NOTE — Letter (Signed)
Summary: Results Follow-up Letter  [facility]   [place], [place] [address]   Phone: [phone]  Fax: [phone]    04/01/2009        [name] [address] [place], [place]  [address]  Dear Ms. Yau,   The following are the results of your recent test(s):  Test     Result     Pap Smear    Normal_______  Not Normal_____       Comments: _________________________________________________________ Cholesterol LDL(Bad cholesterol):          Your goal is less than:         HDL (Good cholesterol):        Your goal is more than: _________________________________________________________ Other Tests:   _________________________________________________________  Please call for an appointment Or _Please see attached labwork and new medication.________________________________________________________ _________________________________________________________ _________________________________________________________  Sincerely,  Carley Hammed [facility]

## 2011-01-26 NOTE — Letter (Signed)
Summary: Results Follow-up Letter  [facility]   [place], [place] [address]   Phone: [phone]  Fax: [phone]    04/23/2008        [address] [place], [place]  [address]  Dear Ms. Dapper,   The following are the results of your recent test(s):  Test     Result     Pap Smear    Normal_______  Not Normal_____       Comments: _________________________________________________________ Cholesterol LDL(Bad cholesterol):          Your goal is less than:         HDL (Good cholesterol):        Your goal is more than: _________________________________________________________ Other Tests:   _________________________________________________________  Please call for an appointment Or _Please see attached.________________________________________________________ _________________________________________________________ _________________________________________________________  Sincerely,  Carley Hammed [place] at [facility]

## 2011-01-26 NOTE — Assessment & Plan Note (Signed)
Summary: roa review labs,cbs   Vital Signs:  Patient Profile:   45 Years Old Female Weight:      185.6 pounds Temp:     98.1 degrees F oral Pulse rate:   88 / minute BP sitting:   138 / 94  (left arm) Cuff size:   large  Vitals Entered By: Georgette Dover (November 05, 2007 9:43 AM)                 PCP:  Etter Sjogren  Chief Complaint:  DISCUSS LABS AND B/P FOLLOW-UP.  History of Present Illness: Pt here to go over NMR and bp.  Pt with no complaints.  Pt joined [employer] again.    Hypertension Follow-Up      This is a 45 year old woman who presents for Hypertension follow-up.  The patient denies lightheadedness, urinary frequency, headaches, edema, impotence, rash, and fatigue.  The patient denies the following associated symptoms: chest pain, chest pressure, exercise intolerance, dyspnea, palpitations, syncope, leg edema, and pedal edema.  Compliance with medications (by patient report) has been near 100%.  The patient reports that dietary compliance has been good.  The patient reports exercising 3-4X per week.  Adjunctive measures currently used by the patient include salt restriction.    Hyperlipidemia Follow-Up      The patient also presents for Hyperlipidemia follow-up.  The patient denies muscle aches, GI upset, abdominal pain, flushing, itching, constipation, diarrhea, and fatigue.  The patient denies the following symptoms: chest pain/pressure, exercise intolerance, dypsnea, palpitations, syncope, and pedal edema.  Compliance with medications (by patient report) has been near 100%.  Dietary compliance has been good.  The patient reports exercising 3-4X per week.  Adjunctive measures currently used by the patient include ASA, fish oil supplements, and weight reduction.    Current Allergies: No known allergies   Past Medical History:    Hypertension    Hyperlipidemia     Review of Systems      See HPI   Physical Exam  General:     Well-developed,well-nourished,in  no acute distress; alert,appropriate and cooperative throughout examination Neck:     No deformities, masses, or tenderness noted. Lungs:     Normal respiratory effort, chest expands symmetrically. Lungs are clear to auscultation, no crackles or wheezes. Heart:     Normal rate and regular rhythm. S1 and S2 normal without gallop, murmur, click, rub or other extra sounds. Extremities:     No clubbing, cyanosis, edema, or deformity noted with normal full range of motion of all joints.      Impression & Recommendations:  Problem # 1:  HYPERLIPIDEMIA (QIO-962.4)  The following medications were removed from the medication list:    Lipitor 20 Mg Tabs (Atorvastatin calcium) .Marland Kitchen... Take 1 tablet by mouth every night    Zetia 10 Mg Tabs (Ezetimibe) .Marland Kitchen... 1 once daily  Her updated medication list for this problem includes:    Crestor 10 Mg Tabs (Rosuvastatin calcium) .Marland Kitchen... 1 by mouth at bedtime  Labs Reviewed: SGOT: 17 (10/08/2007)   SGPT: 17 (10/08/2007)   Problem # 2:  HYPERTENSION (ICD-401.9) re check 2-3- weeks The following medications were removed from the medication list:    Toprol Xl 25 Mg Tb24 (Metoprolol succinate) .Marland Kitchen... 1 by mouth once daily  Her updated medication list for this problem includes:    Spironolactone 25 Mg Tabs (Spironolactone) .Marland Kitchen... 1 by mouth once daily    Diovan 320 Mg Tabs (Valsartan) .Marland Kitchen... 1 by mouth once daily  Toprol Xl 25 Mg Tb24 (Metoprolol succinate) .Marland Kitchen... 1 by mouth q day  BP today: 138/94 Prior BP: 156/98 (08/15/2007)  Labs Reviewed: Creat: 0.7 (08/15/2007)   Complete Medication List: 1)  Wellbutrin Xl 300 Mg Tb24 (Bupropion hcl) .Marland Kitchen.. 1 by mouth once daily 2)  Spironolactone 25 Mg Tabs (Spironolactone) .Marland Kitchen.. 1 by mouth once daily 3)  Yasmin 28 Tabs (Drospirenone-ethinyl estradiol tabs) .Marland Kitchen.. 1 by mouth once daily as directed 4)  Diovan 320 Mg Tabs (Valsartan) .Marland Kitchen.. 1 by mouth once daily 5)  Crestor 10 Mg Tabs (Rosuvastatin calcium) .Marland Kitchen.. 1 by  mouth at bedtime 6)  Zyrtec Allergy 10 Mg Tabs (Cetirizine hcl) .Marland Kitchen.. 1 by mouth once daily 7)  Toprol Xl 25 Mg Tb24 (Metoprolol succinate) .Marland Kitchen.. 1 by mouth q day   Patient Instructions: 1)  fasting labs in 3 months  272.4 401.9   , hep,  NMR, bmp    Prescriptions: TOPROL XL 25 MG  TB24 (METOPROLOL SUCCINATE) 1 by mouth q day  #30 x 2   Entered and Authorized by:   Garnet Koyanagi DO   Signed by:   Garnet Koyanagi DO on 11/05/2007   Method used:   Historical   RxID:   [id number] CRESTOR 10 MG  TABS (ROSUVASTATIN CALCIUM) 1 by mouth at bedtime  #30 x 2   Entered and Authorized by:   Garnet Koyanagi DO   Signed by:   Garnet Koyanagi DO on 11/05/2007   Method used:   Print then Give to Patient   RxID:   [id number]  ]

## 2011-01-26 NOTE — Progress Notes (Signed)
Summary: PRIOR AUTH APPROVED FOR [place] XL-[facility]  Phone Note Refill Request   PRIOR AUTH IN PROCESS FOR BUPROPION XL 150 MG -[place]  Initial call taken by: Verdie Mosher,  February 18, 2008 5:01 PM  Follow-up for Phone Call        Bruno 02/18/2009 [place] ...................................................................Marland KitchenVerdie Mosher  February 19, 2008 12:07 PM  Follow-up by: Verdie Mosher,  February 19, 2008 12:07 PM

## 2011-01-26 NOTE — Assessment & Plan Note (Signed)
Summary: CPX///SPH   Vital Signs:  Patient profile:   45 year old female Height:      64.75 inches Weight:      201.2 pounds Temp:     98.1 degrees F oral Pulse rate:   80 / minute Pulse rhythm:   regular BP sitting:   138 / 82  (left arm) Cuff size:   large  Vitals Entered By: Aron Baba CMA Deborra Medina) (September 06, 2010 8:44 AM) CC: cpx/not fasting   History of Present Illness: Pt here for cpe  Preventive Screening-Counseling & Management  Alcohol-Tobacco     Alcohol drinks/day: 0     Smoking Status: never  Caffeine-Diet-Exercise     Caffeine use/day: 1     Does Patient Exercise: yes     Type of exercise: cardio, trainer     Exercise (avg: min/session): 30-60     Times/week: 4  Hep-HIV-STD-Contraception     Dental Visit-last 6 months yes     Dental Care Counseling: not indicated; dental care within six months     SBE monthly: yes     SBE Education/Counseling: not indicated; SBE done regularly  Safety-Violence-Falls     Seat Belt Use: yes      Sexual History:  currently monogamous.        Drug Use:  no.    Current Medications (verified): 1)  Spironolactone 25 Mg  Tabs (Spironolactone) .Marland Kitchen.. 1 By Mouth Once Daily 2)  Diovan 320 Mg Tabs (Valsartan) .Marland Kitchen.. 1 By Mouth Once Daily 3)  Zyrtec Allergy 10 Mg  Tabs (Cetirizine Hcl) .Marland Kitchen.. 1 By Mouth Once Daily 4)  Lovaza 1 Gm Caps (Omega-3-Acid Ethyl Esters) .... Take 2 Capsules in The Morning and 2 Capsules in The Evening. 5)  Bcp 6)  Norvasc 5 Mg Tabs (Amlodipine Besylate) .Marland Kitchen.. 1 By Mouth At Night. 7)  Vitamin B-12 1000 U 8)  Loestrin Fe 1/20 1-20 Mg-Mcg Tabs (Norethin Ace-Eth Estrad-Fe) 9)  Zocor 20 Mg Tabs (Simvastatin) .Marland Kitchen.. 1 By Mouth At Bedtime  Allergies (verified): No Known Drug Allergies  Past History:  Family History: Last updated: 09/06/2010 father--alz dementia  Family History High cholesterol Family History Hypertension Family History Diabetes 1st degree relative  Social History: Last updated:  09/06/2010 Occupation: babysit Married Never Smoked Alcohol use-no Drug use-no Regular exercise-yes  Risk Factors: Alcohol Use: 0 (09/06/2010) Caffeine Use: 1 (09/06/2010) Exercise: yes (09/06/2010)  Risk Factors: Smoking Status: never (09/06/2010)  Past medical, surgical, family and social histories (including risk factors) reviewed for relevance to current acute and chronic problems.  Past Medical History: Reviewed history from 11/05/2007 and no changes required. Hypertension Hyperlipidemia  Past Surgical History: Reviewed history from 05/15/2007 and no changes required. LEEP LITHOTRIPSY-KIDNEY STONE  Family History: Reviewed history and no changes required. father--alz dementia  Family History High cholesterol Family History Hypertension Family History Diabetes 1st degree relative  Social History: Reviewed history and no changes required. Occupation: babysit Married Never Smoked Alcohol use-no Drug use-no Regular exercise-yes Does Patient Exercise:  yes Smoking Status:  never Caffeine use/day:  1 Dental Care w/in 6 mos.:  yes Seat Belt Use:  yes Sexual History:  currently monogamous Occupation:  employed Drug Use:  no  Review of Systems      See HPI General:  Denies chills, fatigue, fever, loss of appetite, malaise, sleep disorder, sweats, weakness, and weight loss. Eyes:  Denies blurring, discharge, double vision, eye irritation, eye pain, halos, itching, light sensitivity, red eye, vision loss-1 eye, and vision loss-both eyes;  optho q1y--  contacts. ENT:  Denies decreased hearing, difficulty swallowing, ear discharge, earache, hoarseness, nasal congestion, nosebleeds, postnasal drainage, ringing in ears, sinus pressure, and sore throat. CV:  Denies bluish discoloration of lips or nails, chest pain or discomfort, difficulty breathing at night, difficulty breathing while lying down, fainting, fatigue, leg cramps with exertion, lightheadness, near fainting,  palpitations, shortness of breath with exertion, swelling of feet, swelling of hands, and weight gain. Resp:  Denies chest discomfort, chest pain with inspiration, cough, coughing up blood, excessive snoring, hypersomnolence, morning headaches, pleuritic, shortness of breath, sputum productive, and wheezing. GI:  Denies abdominal pain, bloody stools, change in bowel habits, constipation, dark tarry stools, diarrhea, excessive appetite, gas, hemorrhoids, indigestion, loss of appetite, nausea, vomiting, vomiting blood, and yellowish skin color. GU:  Denies abnormal vaginal bleeding, decreased libido, discharge, dysuria, genital sores, hematuria, incontinence, nocturia, urinary frequency, and urinary hesitancy. MS:  Denies joint pain, joint redness, joint swelling, loss of strength, low back pain, mid back pain, muscle aches, muscle , cramps, muscle weakness, stiffness, and thoracic pain. Derm:  Denies changes in color of skin, changes in nail beds, dryness, excessive perspiration, flushing, hair loss, insect bite(s), itching, lesion(s), poor wound healing, and rash. Neuro:  Denies brief paralysis, difficulty with concentration, disturbances in coordination, falling down, headaches, inability to speak, memory loss, numbness, poor balance, seizures, sensation of room spinning, tingling, tremors, visual disturbances, and weakness. Psych:  Denies alternate hallucination ( auditory/visual), anxiety, depression, easily angered, easily tearful, irritability, mental problems, panic attacks, sense of great danger, suicidal thoughts/plans, thoughts of violence, unusual visions or sounds, and thoughts /plans of harming others. Endo:  Denies cold intolerance, excessive hunger, excessive thirst, excessive urination, heat intolerance, polyuria, and weight change. Heme:  Denies abnormal bruising, bleeding, enlarge lymph nodes, fevers, pallor, and skin discoloration. Allergy:  Denies hives or rash, itching eyes, persistent  infections, seasonal allergies, and sneezing.  Physical Exam  General:  Well-developed,well-nourished,in no acute distress; alert,appropriate and cooperative throughout examination Head:  Normocephalic and atraumatic without obvious abnormalities. No apparent alopecia or balding. Eyes:  vision grossly intact, pupils equal, pupils round, pupils reactive to light, and no injection.   Ears:  External ear exam shows no significant lesions or deformities.  Otoscopic examination reveals clear canals, tympanic membranes are intact bilaterally without bulging, retraction, inflammation or discharge. Hearing is grossly normal bilaterally. Nose:  External nasal examination shows no deformity or inflammation. Nasal mucosa are pink and moist without lesions or exudates. Mouth:  Oral mucosa and oropharynx without lesions or exudates.  Teeth in good repair. Neck:  No deformities, masses, or tenderness noted. Chest Wall:  No deformities, masses, or tenderness noted. Breasts:  gyn Lungs:  Normal respiratory effort, chest expands symmetrically. Lungs are clear to auscultation, no crackles or wheezes. Heart:  Normal rate and regular rhythm. S1 and S2 normal without gallop, murmur, click, rub or other extra sounds. Abdomen:  Bowel sounds positive,abdomen soft and non-tender without masses, organomegaly or hernias noted. Rectal:  gyn Genitalia:  gyn Msk:  normal ROM, no joint tenderness, no joint swelling, no joint warmth, no redness over joints, no joint deformities, no joint instability, and no crepitation.  normal ROM, no joint tenderness, no joint swelling, no joint warmth, no redness over joints, no joint deformities, no joint instability, and no crepitation.   Pulses:  R and L carotid,radial,femoral,dorsalis pedis and posterior tibial pulses are full and equal bilaterally Extremities:  No clubbing, cyanosis, edema, or deformity noted with normal full range of motion  of all joints.   Neurologic:  No cranial  nerve deficits noted. Station and gait are normal. Plantar reflexes are down-going bilaterally. DTRs are symmetrical throughout. Sensory, motor and coordinative functions appear intact. Skin:  Intact without suspicious lesions or rashes Cervical Nodes:  No lymphadenopathy noted Axillary Nodes:  No palpable lymphadenopathy Psych:  Cognition and judgment appear intact. Alert and cooperative with normal attention span and concentration. No apparent delusions, illusions, hallucinations   Impression & Recommendations:  Problem # 1:  Cherry (ICD-V70.0) other labs reviewed Orders: Venipuncture (76195) TLB-CBC Platelet - w/Differential (85025-CBCD) TLB-TSH (Thyroid Stimulating Hormone) (84443-TSH) EKG w/ Interpretation (93000)  Problem # 2:  VITAMIN B12 DEFICIENCY (ICD-266.2)  Orders: TLB-B12 + Folate Pnl (09326_71245-Y09/XIP) TLB-IBC Pnl (Iron/FE;Transferrin) (83550-IBC) Specimen Handling (99000)  Problem # 3:  HYPERLIPIDEMIA (ICD-272.4)  Her updated medication list for this problem includes:    Lovaza 1 Gm Caps (Omega-3-acid ethyl esters) .Marland Kitchen... Take 2 capsules in the morning and 2 capsules in the evening.    Zocor 20 Mg Tabs (Simvastatin) .Marland Kitchen... 1 by mouth at bedtime  Labs Reviewed: SGOT: 18 (08/23/2010)   SGPT: 13 (08/23/2010)   HDL:51.80 (08/23/2010), 56.40 (05/03/2010)  LDL:84 (08/23/2010), 65 (10/19/2009)  Chol:163 (08/23/2010), 228 (05/03/2010)  Trig:137.0 (08/23/2010), 144.0 (05/03/2010)  Orders: EKG w/ Interpretation (93000)  Problem # 4:  HYPERTENSION (ICD-401.9)  Her updated medication list for this problem includes:    Spironolactone 25 Mg Tabs (Spironolactone) .Marland Kitchen... 1 by mouth once daily    Diovan 320 Mg Tabs (Valsartan) .Marland Kitchen... 1 by mouth once daily    Norvasc 5 Mg Tabs (Amlodipine besylate) .Marland Kitchen... 1 by mouth at night.  BP today: 138/82 Prior BP: 124/80 (05/05/2010)  Labs Reviewed: K+: 5.0 (08/23/2010) Creat: : 0.7 (08/23/2010)   Chol: 163  (08/23/2010)   HDL: 51.80 (08/23/2010)   LDL: 84 (08/23/2010)   TG: 137.0 (08/23/2010)  Orders: EKG w/ Interpretation (93000)  Complete Medication List: 1)  Spironolactone 25 Mg Tabs (Spironolactone) .Marland Kitchen.. 1 by mouth once daily 2)  Diovan 320 Mg Tabs (Valsartan) .Marland Kitchen.. 1 by mouth once daily 3)  Zyrtec Allergy 10 Mg Tabs (Cetirizine hcl) .Marland Kitchen.. 1 by mouth once daily 4)  Lovaza 1 Gm Caps (Omega-3-acid ethyl esters) .... Take 2 capsules in the morning and 2 capsules in the evening. 5)  Bcp  6)  Norvasc 5 Mg Tabs (Amlodipine besylate) .Marland Kitchen.. 1 by mouth at night. 7)  Vitamin B-12 1000 U  8)  Loestrin Fe 1/20 1-20 Mg-mcg Tabs (Norethin ace-eth estrad-fe) 9)  Zocor 20 Mg Tabs (Simvastatin) .Marland Kitchen.. 1 by mouth at bedtime  Other Orders: T-Culture, Urine ([id number]) UA Dipstick w/o Micro (manual) (73419)  Patient Instructions: 1)  Please schedule a follow-up appointment in 6 months .    EKG  Procedure date:  09/06/2010  Findings:      Normal sinus rhythm with rate of:  78   PAP Result Date:  12/08/2009 PAP Result:  normal PAP Next Due:  1 yr Mammogram Result Date:  08/10/2010 Mammogram Result:  normal Mammogram Next Due:  1 yr    Laboratory Results   Urine Tests   Date/Time Reported: September 06, 2010 11:16 AM   Routine Urinalysis   Color: yellow Appearance: Clear Glucose: negative   (Normal Range: Negative) Bilirubin: negative   (Normal Range: Negative) Ketone: negative   (Normal Range: Negative) Spec. Gravity: 1.025   (Normal Range: 1.003-1.035) Blood: large   (Normal Range: Negative) pH: 5.0   (Normal Range: 5.0-8.0) Protein: 100   (  Normal Range: Negative) Urobilinogen: negative   (Normal Range: 0-1) Nitrite: negative   (Normal Range: Negative) Leukocyte Esterace: negative   (Normal Range: Negative)    Comments: Heath Lark  September 06, 2010 11:16 AM

## 2011-01-26 NOTE — Assessment & Plan Note (Signed)
Summary: FOLLOW UP ON LABS//PH   Vital Signs:  Patient profile:   45 year old female Weight:      202.50 pounds Temp:     98.1 degrees F oral Pulse rate:   80 / minute Pulse rhythm:   regular BP sitting:   126 / 80  (left arm) Cuff size:   large  Vitals Entered By: Allyn Kenner CMA (October 25, 2009 11:17 AM) CC: follow up on labwork.    History of Present Illness: Pt here to go over labs only.  No complaints.  Current Medications (verified): 1)  Wellbutrin Xl 150 Mg  Tb24 (Bupropion Hcl) .Marland Kitchen.. 1 By Mouth Once Daily 2)  Spironolactone 25 Mg  Tabs (Spironolactone) .Marland Kitchen.. 1 By Mouth Once Daily 3)  Yasmin 28  Tabs (Drospirenone-Ethinyl Estradiol Tabs) .Marland Kitchen.. 1 By Mouth Once Daily As Directed 4)  Diovan 320 Mg Tabs (Valsartan) .Marland Kitchen.. 1 By Mouth Once Daily 5)  Crestor 20 Mg  Tabs (Rosuvastatin Calcium) .... Take One-Half Tablet Daily. 6)  Zyrtec Allergy 10 Mg  Tabs (Cetirizine Hcl) .Marland Kitchen.. 1 By Mouth Once Daily 7)  Norvasc 5 Mg  Tabs (Amlodipine Besylate) .Marland Kitchen.. 1 By Mouth Once Daily 8)  Flexeril 5 Mg  Tabs (Cyclobenzaprine Hcl) .Marland Kitchen.. 1 By Mouth Three Times A Day 9)  Nasacort Aq 55 Mcg/act Aers (Triamcinolone Acetonide(Nasal)) .... 2 Sprays Each Nostril Once Daily 10)  Lovaza 1 Gm Caps (Omega-3-Acid Ethyl Esters) .... Take 2 Capsules in The Morning and 2 Capsules in The Evening.  Allergies (verified): No Known Drug Allergies  Review of Systems      See HPI  Physical Exam  General:  Well-developed,well-nourished,in no acute distress; alert,appropriate and cooperative throughout examination Psych:  Cognition and judgment appear intact. Alert and cooperative with normal attention span and concentration. No apparent delusions, illusions, hallucinations   Impression & Recommendations:  Problem # 1:  HYPERLIPIDEMIA (ICD-272.4)  Her updated medication list for this problem includes:    Crestor 20 Mg Tabs (Rosuvastatin calcium) .Marland Kitchen... Take one-half tablet daily.    Lovaza 1 Gm Caps  (Omega-3-acid ethyl esters) .Marland Kitchen... Take 2 capsules in the morning and 2 capsules in the evening.  Labs Reviewed: SGOT: 21 (03/09/2009)   SGPT: 18 (03/09/2009)   HDL:46.90 (03/09/2009), 48.7 (08/19/2008)  LDL:43 (03/09/2009), 64 (08/19/2008)  Chol:126 (03/09/2009), 149 (08/19/2008)  Trig:181.0 (03/09/2009), 183 (08/19/2008)  Complete Medication List: 1)  Wellbutrin Xl 150 Mg Tb24 (Bupropion hcl) .Marland Kitchen.. 1 by mouth once daily 2)  Spironolactone 25 Mg Tabs (Spironolactone) .Marland Kitchen.. 1 by mouth once daily 3)  Yasmin 28 Tabs (Drospirenone-ethinyl estradiol tabs) .Marland Kitchen.. 1 by mouth once daily as directed 4)  Diovan 320 Mg Tabs (Valsartan) .Marland Kitchen.. 1 by mouth once daily 5)  Crestor 20 Mg Tabs (Rosuvastatin calcium) .... Take one-half tablet daily. 6)  Zyrtec Allergy 10 Mg Tabs (Cetirizine hcl) .Marland Kitchen.. 1 by mouth once daily 7)  Norvasc 5 Mg Tabs (Amlodipine besylate) .Marland Kitchen.. 1 by mouth once daily 8)  Flexeril 5 Mg Tabs (Cyclobenzaprine hcl) .Marland Kitchen.. 1 by mouth three times a day 9)  Nasacort Aq 55 Mcg/act Aers (Triamcinolone acetonide(nasal)) .... 2 sprays each nostril once daily 10)  Lovaza 1 Gm Caps (Omega-3-acid ethyl esters) .... Take 2 capsules in the morning and 2 capsules in the evening.  Other Orders: Admin 1st Vaccine [id number] 0008) Flu Vaccine 69yr + ((44315 Flu Vaccine Consent Questions     Do you have a history of severe allergic reactions to this vaccine? no    Any  prior history of allergic reactions to egg and/or gelatin? no    Do you have a sensitivity to the preservative Thimersol? no    Do you have a past history of Guillan-Barre Syndrome? no    Do you currently have an acute febrile illness? no    Have you ever had a severe reaction to latex? no    Vaccine information given and explained to patient? yes    Are you currently pregnant? no    Lot Number:[id number]   Exp Date:06/23/2010   Site Given  Left Deltoid IM Prescriptions: DIOVAN 320 MG TABS (VALSARTAN) 1 by mouth once daily  #90 x 3   Entered and  Authorized by:   Garnet Koyanagi DO   Signed by:   Garnet Koyanagi DO on 10/25/2009   Method used:   Print then Give to Patient   RxID:   [id number] [place] 5 MG  TABS (AMLODIPINE BESYLATE) 1 by mouth once daily  #90 x 3   Entered and Authorized by:   Garnet Koyanagi DO   Signed by:   Garnet Koyanagi DO on 10/25/2009   Method used:   Electronically to        [facility] (mail-order)             ,          Ph: [phone]       Fax: [phone]   RxID:   [id number] SPIRONOLACTONE 25 MG  TABS (SPIRONOLACTONE) 1 by mouth once daily  #90 x 3   Entered and Authorized by:   Garnet Koyanagi DO   Signed by:   Garnet Koyanagi DO on 10/25/2009   Method used:   Electronically to        [facility] (mail-order)             ,          Ph: [phone]       Fax: [phone]   RxID:   [id number]     .lbflu

## 2011-01-26 NOTE — Assessment & Plan Note (Signed)
Summary: ACUTE ONLY FOR STREP THROAT   Vital Signs:  Patient profile:   45 year old female Height:      64 inches Weight:      200 pounds BMI:     34.45 BSA:     1.96 Temp:     98.8 degrees F oral Pulse rate:   76 / minute Resp:     18 per minute BP sitting:   130 / 80  (left arm)  Vitals Entered By: Carley Hammed (March 09, 2009 11:35 AM) CC: URI symptoms Is Patient Diabetic? No Pain Assessment Patient in pain? no       Have you ever been in a relationship where you felt threatened, hurt or afraid?No   Does patient need assistance? Functional Status Self care   History of Present Illness:       This is a 45 year old woman who presents with URI symptoms.  The symptoms began 1 day ago.  Pt here c/o st and feels bad for 1 day.  The patient complains of sore throat, but denies nasal congestion, clear nasal discharge, purulent nasal discharge, dry cough, productive cough, earache, and sick contacts.  The patient denies fever, low-grade fever (<100.5 degrees), fever of 100.5-103 degrees, fever of 103.1-104 degrees, fever to >104 degrees, stiff neck, dyspnea, wheezing, rash, vomiting, diarrhea, use of an antipyretic, and response to antipyretic.  The patient also reports muscle aches and severe fatigue.  The patient denies itchy watery eyes, itchy throat, sneezing, seasonal symptoms, response to antihistamine, and headache.  Risk factors for Strep sinusitis include Strep exposure.  The patient denies the following risk factors for Strep sinusitis: unilateral facial pain, unilateral nasal discharge, poor response to decongestant, double sickening, tooth pain, tender adenopathy, and absence of cough.    Problems Prior to Update: 1)  Nephrolithiasis, Hx of  (ICD-V13.01) 2)  Back Strain, Lumbar  (ICD-847.2) 3)  Hyperlipidemia  (ICD-272.4) 4)  Anxiety State Nos  (ICD-300.00) 5)  Palpitations, Occasional  (ICD-785.1) 6)  Fatigue  (ICD-780.79) 7)  Bronchitis, Acute  (ICD-466.0) 8)   Hypertension  (ICD-401.9)  Medications Prior to Update: 1)  Wellbutrin Xl 150 Mg  Tb24 (Bupropion Hcl) .Marland Kitchen.. 1 By Mouth Once Daily 2)  Spironolactone 25 Mg  Tabs (Spironolactone) .Marland Kitchen.. 1 By Mouth Once Daily 3)  Yasmin 28  Tabs (Drospirenone-Ethinyl Estradiol Tabs) .Marland Kitchen.. 1 By Mouth Once Daily As Directed 4)  Diovan 320 Mg Tabs (Valsartan) .Marland Kitchen.. 1 By Mouth Once Daily 5)  Crestor 20 Mg  Tabs (Rosuvastatin Calcium) .... Take One-Half Tablet Daily. 6)  Zyrtec Allergy 10 Mg  Tabs (Cetirizine Hcl) .Marland Kitchen.. 1 By Mouth Once Daily 7)  Norvasc 5 Mg  Tabs (Amlodipine Besylate) .Marland Kitchen.. 1 By Mouth Once Daily 8)  Flexeril 5 Mg  Tabs (Cyclobenzaprine Hcl) .Marland Kitchen.. 1 By Mouth Three Times A Day  Allergies (verified): No Known Drug Allergies  Past History:  Risk Factors:    Alcohol Use: N/A    >5 drinks/d w/in last 3 months: N/A    Caffeine Use: N/A    Diet: N/A    Exercise: N/A  Risk Factors:    Smoking Status: N/A    Packs/Day: N/A    Cigars/wk: N/A    Pipe Use/wk: N/A    Cans of tobacco/wk: N/A    Passive Smoke Exposure: N/A  Past medical, surgical, family and social histories (including risk factors) reviewed, and no changes noted (except as noted below).  Past Medical History:    Reviewed  history from 11/05/2007 and no changes required:    Hypertension    Hyperlipidemia  Past Surgical History:    Reviewed history from 05/15/2007 and no changes required:    LEEP    LITHOTRIPSY-KIDNEY STONE  Family History:    Reviewed history and no changes required:  Social History:    Reviewed history and no changes required:  Review of Systems      See HPI  Physical Exam  General:  Well-developed,well-nourished,in no acute distress; alert,appropriate and cooperative throughout examination Ears:  External ear exam shows no significant lesions or deformities.  Otoscopic examination reveals clear canals, tympanic membranes are intact bilaterally without bulging, retraction, inflammation or discharge.  Hearing is grossly normal bilaterally. Nose:  L frontal sinus tenderness, L maxillary sinus tenderness, R frontal sinus tenderness, and R maxillary sinus tenderness.   Mouth:  Oral mucosa and oropharynx without lesions or exudates.  Teeth in good repair. Neck:  cervical lymphadenopathy.   Lungs:  Normal respiratory effort, chest expands symmetrically. Lungs are clear to auscultation, no crackles or wheezes. Heart:  normal rate, regular rhythm, and no murmur.   Extremities:  No clubbing, cyanosis, edema, or deformity noted with normal full range of motion of all joints.   Skin:  Intact without suspicious lesions or rashes Psych:  Cognition and judgment appear intact. Alert and cooperative with normal attention span and concentration. No apparent delusions, illusions, hallucinations   Impression & Recommendations:  Problem # 1:  SINUSITIS - ACUTE-NOS (ICD-461.9)  Her updated medication list for this problem includes:    Augmentin 875-125 Mg Tabs (Amoxicillin-pot clavulanate) .Marland Kitchen... 1 by mouth two times a day    Nasacort Aq 55 Mcg/act Aers (Triamcinolone acetonide(nasal)) .Marland Kitchen... 2 sprays each nostril once daily  Instructed on treatment. Call if symptoms persist or worsen.   Complete Medication List: 1)  Wellbutrin Xl 150 Mg Tb24 (Bupropion hcl) .Marland Kitchen.. 1 by mouth once daily 2)  Spironolactone 25 Mg Tabs (Spironolactone) .Marland Kitchen.. 1 by mouth once daily 3)  Yasmin 28 Tabs (Drospirenone-ethinyl estradiol tabs) .Marland Kitchen.. 1 by mouth once daily as directed 4)  Diovan 320 Mg Tabs (Valsartan) .Marland Kitchen.. 1 by mouth once daily 5)  Crestor 20 Mg Tabs (Rosuvastatin calcium) .... Take one-half tablet daily. 6)  Zyrtec Allergy 10 Mg Tabs (Cetirizine hcl) .Marland Kitchen.. 1 by mouth once daily 7)  Norvasc 5 Mg Tabs (Amlodipine besylate) .Marland Kitchen.. 1 by mouth once daily 8)  Flexeril 5 Mg Tabs (Cyclobenzaprine hcl) .Marland Kitchen.. 1 by mouth three times a day 9)  Augmentin 875-125 Mg Tabs (Amoxicillin-pot clavulanate) .Marland Kitchen.. 1 by mouth two times a day 10)   Nasacort Aq 55 Mcg/act Aers (Triamcinolone acetonide(nasal)) .... 2 sprays each nostril once daily  Other Orders: Venipuncture (16109) TLB-B12 + Folate Pnl (60454_09811-B14/NWG) TLB-Lipid Panel (80061-LIPID) TLB-BMP (Basic Metabolic Panel-BMET) (95621-HYQMVHQ) TLB-CBC Platelet - w/Differential (85025-CBCD) TLB-Hepatic/Liver Function Pnl (80076-HEPATIC) Prescriptions: NASACORT AQ 55 MCG/ACT AERS (TRIAMCINOLONE ACETONIDE(NASAL)) 2 sprays each nostril once daily  #1 x 0   Entered and Authorized by:   Garnet Koyanagi DO   Signed by:   Garnet Koyanagi DO on 03/09/2009   Method used:   Historical   RxID:   [id number] AUGMENTIN 875-125 MG TABS (AMOXICILLIN-POT CLAVULANATE) 1 by mouth two times a day  #20 x 0   Entered and Authorized by:   Garnet Koyanagi DO   Signed by:   Garnet Koyanagi DO on 03/09/2009   Method used:   Electronically to        [facility] (retail)  [address], [place]  [address]       Ph: [phone]       Fax: [phone]   RxID:   [id number]

## 2011-01-26 NOTE — Progress Notes (Signed)
Summary: L-SPINE REPORT  Phone Note Outgoing Call   Details for Reason: L-SPINE REPORT Summary of Call: SPOKE WITH PATIENT ABOUT L-SPINE REPORT. PATIENT OK'D INFORMATION (SEE APPEND) PATIENT SAID SHE IS STILL WITH THE PAIN, ITS EXACTLY IN THE SAME AREA AND ITS WORSE IN THE MORNINGS. PER DR.LOWNE WE CAN ORDER A MRI. PATIENT SAID SHE WILL WAIT AND SHE HOW PAIN IS OVER THE NEXT FEW DAYS AND CALL BACK IF SHE FEELS MRI IS NEEDED ..................................................................Marland KitchenChrae Malloy  March 03, 2008 2:03 PM

## 2011-01-26 NOTE — Progress Notes (Signed)
Summary: URINE CULTURE  Phone Note Outgoing Call   Call placed by: Rolla Flatten CMA,  September 23, 2010 8:42 AM Details for Reason: contaminated----recheck if symptomatic Summary of Call: Left message to call office....................Marland KitchenFelecia Deloach CMA  September 23, 2010 8:42 AM  Discuss with patient ...................Marland KitchenFelecia Deloach CMA  September 23, 2010 10:22 AM

## 2011-01-26 NOTE — Assessment & Plan Note (Signed)
Summary: CONGESTED/CBS   Vital Signs:  Patient profile:   45 year old female Weight:      202 pounds Temp:     97.9 degrees F oral Pulse rate:   70 / minute Pulse rhythm:   regular BP sitting:   124 / 80  (left arm) Cuff size:   large  Vitals Entered By: Allyn Kenner CMA (May 05, 2010 45:41 AM) CC: Pt here for possible sinus infection, congested, pressure in teeth., URI symptoms   History of Present Illness:       This is a 45 year old woman who presents with URI symptoms.  The symptoms began 2 weeks ago.  The patient complains of nasal congestion, purulent nasal discharge, and sick contacts, but denies clear nasal discharge, sore throat, dry cough, productive cough, and earache.  The patient denies fever, low-grade fever (<100.5 degrees), fever of 100.5-103 degrees, fever of 103.1-104 degrees, fever to >104 degrees, stiff neck, dyspnea, wheezing, rash, vomiting, diarrhea, use of an antipyretic, and response to antipyretic.  The patient also reports headache.  The patient denies itchy watery eyes, itchy throat, sneezing, seasonal symptoms, response to antihistamine, muscle aches, and severe fatigue.  The patient denies the following risk factors for Strep sinusitis: unilateral facial pain, unilateral nasal discharge, poor response to decongestant, double sickening, tooth pain, Strep exposure, tender adenopathy, and absence of cough.  Pt c/o b/l sinus pressure and teeth pain.   Pt taking zyrtec daily and benadryl at night.     Current Medications (verified): 1)  Spironolactone 25 Mg  Tabs (Spironolactone) .Marland Kitchen.. 1 By Mouth Once Daily 2)  Diovan 320 Mg Tabs (Valsartan) .Marland Kitchen.. 1 By Mouth Once Daily 3)  Zyrtec Allergy 10 Mg  Tabs (Cetirizine Hcl) .Marland Kitchen.. 1 By Mouth Once Daily 4)  Lovaza 1 Gm Caps (Omega-3-Acid Ethyl Esters) .... Take 2 Capsules in The Morning and 2 Capsules in The Evening. 5)  Bcp 6)  Norvasc 5 Mg Tabs (Amlodipine Besylate) .Marland Kitchen.. 1 By Mouth At Night. 7)  Vitamin B-12 1000 U 8)   Nortrel .... Bcp Not Sure of Dose 9)  Loestrin Fe 1/20 1-20 Mg-Mcg Tabs (Norethin Ace-Eth Estrad-Fe) 10)  Augmentin 875-125 Mg Tabs (Amoxicillin-Pot Clavulanate) .Marland Kitchen.. 1 By Mouth Two Times A Day 11)  Zocor 20 Mg Tabs (Simvastatin) .Marland Kitchen.. 1 By Mouth At Bedtime  Allergies (verified): No Known Drug Allergies  Past History:  Past medical, surgical, family and social histories (including risk factors) reviewed for relevance to current acute and chronic problems.  Past Medical History: Reviewed history from 11/05/2007 and no changes required. Hypertension Hyperlipidemia  Past Surgical History: Reviewed history from 05/15/2007 and no changes required. LEEP LITHOTRIPSY-KIDNEY STONE  Family History: Reviewed history and no changes required.  Social History: Reviewed history and no changes required.  Review of Systems      See HPI  Physical Exam  General:  Well-developed,well-nourished,in no acute distress; alert,appropriate and cooperative throughout examination Ears:  External ear exam shows no significant lesions or deformities.  Otoscopic examination reveals clear canals, tympanic membranes are intact bilaterally without bulging, retraction, inflammation or discharge. Hearing is grossly normal bilaterally. Nose:  L frontal sinus tenderness, L maxillary sinus tenderness, R frontal sinus tenderness, and R maxillary sinus tenderness.   Mouth:  Oral mucosa and oropharynx without lesions or exudates.  Teeth in good repair. Neck:  cervical lymphadenopathy.     Impression & Recommendations:  Problem # 1:  SINUSITIS - ACUTE-NOS (OXB-353.9)  Her updated medication list for this problem  includes:    Augmentin 875-125 Mg Tabs (Amoxicillin-pot clavulanate) .Marland Kitchen... 1 by mouth two times a day    con't zyrtec  Instructed on treatment. Call if symptoms persist or worsen.   Problem # 2:  HYPERLIPIDEMIA (VTX-521.4)  Her updated medication list for this problem includes:    Lovaza 1 Gm Caps  (Omega-3-acid ethyl esters) .Marland Kitchen... Take 2 capsules in the morning and 2 capsules in the evening.    Zocor 20 Mg Tabs (Simvastatin) .Marland Kitchen... 1 by mouth at bedtime  Labs Reviewed: SGOT: 23 (10/19/2009)   SGPT: 18 (10/19/2009)   HDL:48.70 (10/19/2009), 46.90 (03/09/2009)  LDL:65 (10/19/2009), 43 (03/09/2009)  Chol:137 (10/19/2009), 126 (03/09/2009)  Trig:117.0 (10/19/2009), 181.0 (03/09/2009)  Complete Medication List: 1)  Spironolactone 25 Mg Tabs (Spironolactone) .Marland Kitchen.. 1 by mouth once daily 2)  Diovan 320 Mg Tabs (Valsartan) .Marland Kitchen.. 1 by mouth once daily 3)  Zyrtec Allergy 10 Mg Tabs (Cetirizine hcl) .Marland Kitchen.. 1 by mouth once daily 4)  Lovaza 1 Gm Caps (Omega-3-acid ethyl esters) .... Take 2 capsules in the morning and 2 capsules in the evening. 5)  Bcp  6)  Norvasc 5 Mg Tabs (Amlodipine besylate) .Marland Kitchen.. 1 by mouth at night. 7)  Vitamin B-12 1000 U  8)  Nortrel  .... Bcp not sure of dose 9)  Loestrin Fe 1/20 1-20 Mg-mcg Tabs (Norethin ace-eth estrad-fe) 10)  Augmentin 875-125 Mg Tabs (Amoxicillin-pot clavulanate) .Marland Kitchen.. 1 by mouth two times a day 11)  Zocor 20 Mg Tabs (Simvastatin) .Marland Kitchen.. 1 by mouth at bedtime  Patient Instructions: 1)  Please return for a FASTING Lipid Profile in 3 months   NMR and hep  272.4  bmp 401.9 Prescriptions: ZOCOR 20 MG TABS (SIMVASTATIN) 1 by mouth at bedtime  #30 x 2   Entered and Authorized by:   Garnet Koyanagi DO   Signed by:   Garnet Koyanagi DO on 05/05/2010   Method used:   Electronically to        [facility] (retail)       [address] [place], [place]  [address]       Ph: [phone]       Fax: [phone]   RxID:   [id number] AUGMENTIN 875-125 MG TABS (AMOXICILLIN-POT CLAVULANATE) 1 by mouth two times a day  #20 x 0   Entered and Authorized by:   Garnet Koyanagi DO   Signed by:   Garnet Koyanagi DO on 05/05/2010   Method used:   Electronically to        [facility] (retail)       [address] [place], [place]  [address]       Ph: [phone]       Fax: [phone]   RxID:   [id number]

## 2011-01-26 NOTE — Assessment & Plan Note (Signed)
Summary: bp up,cbs ok per chrae   Vital Signs:  Patient Profile:   45 Years Old Female Weight:      190 pounds Temp:     98.6 degrees F oral Pulse rate:   120 / minute BP sitting:   156 / 98  (left arm) Cuff size:   large  Vitals Entered By: Georgette Dover (August 15, 2007 2:48 PM)               Chief Complaint:  ELEVATED B/P, PATIENT FELT LIKE SHE WAS GOING TO PASS OUT, and URI symptoms.  History of Present Illness:  Hypertension Follow-Up      This is a 45 year old woman who presents for Hypertension follow-up.  Pt here because b/p has been going up.   Pt has been sick with congestion since Sunday.  Pt had a chest pain yesterday but she had worked out in the morning--  lasted over night.  Now gone.  The patient reports lightheadedness, but denies urinary frequency, headaches, edema, impotence, rash, and fatigue.  The patient denies the following associated symptoms: chest pain, chest pressure, exercise intolerance, dyspnea, palpitations, syncope, leg edema, and pedal edema.  Compliance with medications (by patient report) has been near 100%.  The patient reports that dietary compliance has been excellent.  The patient reports exercising daily.  Adjunctive measures currently used by the patient include salt restriction.    URI Symptoms      The patient also presents with URI symptoms.  The symptoms began duration > 3 days ago.  Pt here c/o 45  day hx of congestion.  The patient reports nasal congestion, purulent nasal discharge, and productive cough, but denies clear nasal discharge, sore throat, dry cough, earache, and sick contacts.  The patient denies fever, low-grade fever (<100.5 degrees), fever of 100.5-103 degrees, fever of 103.1-104 degrees, fever to >104 degrees, stiff neck, dyspnea, wheezing, rash, vomiting, diarrhea, use of an antipyretic, and response to antipyretic.  The patient denies itchy watery eyes, itchy throat, sneezing, seasonal symptoms, response to antihistamine,  headache, muscle aches, and severe fatigue.  Risk factors for Strep sinusitis include unilateral facial pain.  The patient denies the following risk factors for Strep sinusitis: unilateral nasal discharge, poor response to decongestant, double sickening, tooth pain, Strep exposure, tender adenopathy, and absence of cough.   Pt has taken delsym with relief.     Current Allergies: No known allergies      Review of Systems      See HPI   Physical Exam  General:     Well-developed,well-nourished,in no acute distress; alert,appropriate and cooperative throughout examination Head:     Normocephalic and atraumatic without obvious abnormalities. No apparent alopecia or balding. Ears:     External ear exam shows no significant lesions or deformities.  Otoscopic examination reveals clear canals, tympanic membranes are intact bilaterally without bulging, retraction, inflammation or discharge. Hearing is grossly normal bilaterally. Nose:     TURB ERRYTHEMA Mouth:     postnasal drip.   Neck:     No deformities, masses, or tenderness noted. Lungs:     Normal respiratory effort, chest expands symmetrically. Lungs are clear to auscultation, no crackles or wheezes. Heart:     Normal rate and regular rhythm. S1 and S2 normal without gallop, murmur, click, rub or other extra sounds. Skin:     Intact without suspicious lesions or rashes Cervical Nodes:     No lymphadenopathy noted    Impression & Recommendations:  Problem # 1:  BRONCHITIS, ACUTE (ICD-466.0)  Her updated medication list for this problem includes:    Augmentin 875-125 Mg Tabs (Amoxicillin-pot clavulanate) .Marland Kitchen... 1 by mouth two times a day con't with Mucinex  Take antibiotics and other medications as directed. Encouraged to push clear liquids, get enough rest, and take acetaminophen as needed. To be seen in 5-7 days if no improvement, sooner if worse.  Her updated medication list for this problem includes:    Augmentin  875-125 Mg Tabs (Amoxicillin-pot clavulanate) .Marland Kitchen... 1 by mouth bid   Problem # 2:  HYPERTENSION (ICD-401.9)  The following medications were removed from the medication list:    Diovan 160 Mg Tabs (Valsartan) .Marland Kitchen... Take 1 tablet by mouth once a day  Her updated medication list for this problem includes:    Spironolactone 25 Mg Tabs (Spironolactone) .Marland Kitchen... 1 by mouth once daily    Diovan 320 Mg Tabs (Valsartan)    Toprol Xl 25 Mg Tb24 (Metoprolol succinate) .Marland Kitchen... 1 by mouth once daily  Orders: Venipuncture (69629) TLB-BMP (Basic Metabolic Panel-BMET) ([id number]) TLB-CBC Platelet - w/Differential (85025-CBCD) TLB-TSH (Thyroid Stimulating Hormone) (84443-TSH)  BP today: 156/98  Labs Reviewed: Creat: 0.8 (12/31/2006)   Problem # 3:  PALPITATIONS, OCCASIONAL (ICD-785.1)  Her updated medication list for this problem includes:    Toprol Xl 25 Mg Tb24 (Metoprolol succinate) .Marland Kitchen... 1 by mouth once daily  Orders: Cardiology Referral (Cardiology) EKG w/ Interpretation (93000) Avoid caffeine, chocolate, and stimulants. Stress reduction as well as medication options discussed.   Problem # 4:  ANXIETY STATE NOS (ICD-300.00)  Her updated medication list for this problem includes:    Wellbutrin Xl 300 Mg Tb24 (Bupropion hcl) Discussed medication use and relaxation techniques.  Offered to give rx for ativan for temporary relief panic feelings she is having.  Complete Medication List: 1)  Lipitor 20 Mg Tabs (Atorvastatin calcium) .... Take 1 tablet by mouth every night 2)  Wellbutrin Xl 300 Mg Tb24 (Bupropion hcl) 3)  Spironolactone 25 Mg Tabs (Spironolactone) .Marland Kitchen.. 1 by mouth once daily 4)  Yasmin 28 Tabs (Drospirenone-ethinyl estradiol tabs) 5)  Diovan 320 Mg Tabs (Valsartan) 6)  Toprol Xl 25 Mg Tb24 (Metoprolol succinate) .Marland Kitchen.. 1 by mouth once daily 7)  Augmentin 875-125 Mg Tabs (Amoxicillin-pot clavulanate) .Marland Kitchen.. 1 by mouth bid     Prescriptions: TOPROL XL 25 MG  TB24  (METOPROLOL SUCCINATE) 1 by mouth once daily  #30 x 2   Entered and Authorized by:   Garnet Koyanagi DO   Signed by:   Garnet Koyanagi DO on 08/15/2007   Method used:   Print then Give to Patient   RxID:   [id number] AUGMENTIN 875-125 MG  TABS (AMOXICILLIN-POT CLAVULANATE) 1 by mouth bid  #20 x 0   Entered and Authorized by:   Garnet Koyanagi DO   Signed by:   Garnet Koyanagi DO on 08/15/2007   Method used:   Print then Give to Patient   RxID:   [id number] NORVASC 5 MG  TABS (AMLODIPINE BESYLATE) 1 by mouth once daily  #30 x 2   Entered and Authorized by:   Garnet Koyanagi DO   Signed by:   Garnet Koyanagi DO on 08/15/2007   Method used:   Print then Give to Patient   RxID:   [id number] SPIRONOLACTONE 25 MG  TABS (SPIRONOLACTONE) 1 by mouth once daily  #30 x 2   Entered and Authorized by:   Garnet Koyanagi DO   Signed by:   Garnet Koyanagi  DO on 08/15/2007   Method used:   Historical   RxID:   [id number]    EKG  Procedure date:  08/15/2007  Findings:      Sinus tachycardia with rate of:  100   EKG  Procedure date:  08/15/2007  Findings:      Sinus tachycardia with rate of:  100

## 2011-01-26 NOTE — Progress Notes (Signed)
Summary: SIMVASTATIN REFILL  Phone Note Refill Request Message from:  Fax from Pharmacy on October 13, 2010 4:47 PM  Refills Requested: Medication #1:  ZOCOR 20 MG TABS 1 by mouth at bedtime.   Last Refilled: 09/09/2010 [facility], [address], [place], [place]     FAX [phone]  Initial call taken by: Berneta Sages,  October 13, 2010 4:57 PM    Prescriptions: ZOCOR 20 MG TABS (SIMVASTATIN) 1 by mouth at bedtime  #30 x 0   Entered by:   Aron Baba CMA ([place])   Authorized by:   Garnet Koyanagi DO   Signed by:   Aron Baba CMA ([place]) on 10/13/2010   Method used:   Electronically to        [facility] (retail)       [address] [place], [place]  [address]       Ph: [phone]       Fax: [phone]   RxID:   [id number]

## 2011-01-26 NOTE — Assessment & Plan Note (Signed)
Summary: for a pt check--PH   Vital Signs:  Patient Profile:   45 Years Old Female Weight:      201.8 pounds Temp:     98.2 degrees F oral Pulse rate:   78 / minute Resp:     18 per minute BP sitting:   130 / 94  (right arm)  Pt. in pain?   no  Vitals Entered By: Carley Hammed (July 29, 2008 11:30 AM)                  PCP:  Etter Sjogren  Chief Complaint:  follow-up bp .  History of Present Illness:  Hypertension Follow-Up      This is a 45 year old woman who presents for Hypertension follow-up.  pt here to f.u bp -- she had a pork dinner last night.  The patient denies lightheadedness, urinary frequency, headaches, edema, impotence, rash, and fatigue.  The patient denies the following associated symptoms: chest pain, chest pressure, exercise intolerance, dyspnea, palpitations, syncope, leg edema, and pedal edema.  Compliance with medications (by patient report) has been near 100%.  The patient reports that dietary compliance has been fair.  Adjunctive measures currently used by the patient include salt restriction.      Current Allergies: No known allergies   Past Medical History:    Reviewed history from 11/05/2007 and no changes required:       Hypertension       Hyperlipidemia  Past Surgical History:    Reviewed history from 05/15/2007 and no changes required:       LEEP       LITHOTRIPSY-KIDNEY STONE     Review of Systems      See HPI   Physical Exam  General:     Well-developed,well-nourished,in no acute distress; alert,appropriate and cooperative throughout examination Lungs:     Normal respiratory effort, chest expands symmetrically. Lungs are clear to auscultation, no crackles or wheezes. Heart:     normal rate, regular rhythm, and no murmur.   Abdomen:     Bowel sounds positive,abdomen soft and non-tender without masses, organomegaly or hernias noted. Extremities:     No clubbing, cyanosis, edema, or deformity noted with normal full range of  motion of all joints.   Psych:     Oriented X3, memory intact for recent and remote, and normally interactive.      Impression & Recommendations:  Problem # 1:  HYPERTENSION (ICD-401.9)  Her updated medication list for this problem includes:    Spironolactone 25 Mg Tabs (Spironolactone) .Marland Kitchen... 1 by mouth once daily    Diovan 320 Mg Tabs (Valsartan) .Marland Kitchen... 1 by mouth once daily    Norvasc 5 Mg Tabs (Amlodipine besylate) .Marland Kitchen... 1 by mouth once daily  BP today: 130/94 Prior BP: 118/80 (02/18/2008)  Labs Reviewed: Creat: 0.8 (02/06/2008)   Problem # 2:  NEPHROLITHIASIS, HX OF (ICD-V13.01) Strain urine Orders Urology Referral (Urology) Mri back --  no reason for back pain seen---   Pt with hx renal calculi  Orders: Urology Referral (Urology)   Complete Medication List: 1)  Wellbutrin Xl 150 Mg Tb24 (Bupropion hcl) .Marland Kitchen.. 1 by mouth once daily 2)  Spironolactone 25 Mg Tabs (Spironolactone) .Marland Kitchen.. 1 by mouth once daily 3)  Yasmin 28 Tabs (Drospirenone-ethinyl estradiol tabs) .Marland Kitchen.. 1 by mouth once daily as directed 4)  Diovan 320 Mg Tabs (Valsartan) .Marland Kitchen.. 1 by mouth once daily 5)  Crestor 20 Mg Tabs (Rosuvastatin calcium) .... Take one-half tablet daily.  6)  Zyrtec Allergy 10 Mg Tabs (Cetirizine hcl) .Marland Kitchen.. 1 by mouth once daily 7)  Norvasc 5 Mg Tabs (Amlodipine besylate) .Marland Kitchen.. 1 by mouth once daily 8)  Flexeril 5 Mg Tabs (Cyclobenzaprine hcl) .Marland Kitchen.. 1 by mouth three times a day    ]

## 2011-01-26 NOTE — Progress Notes (Signed)
Summary: question about Metoprolol  Phone Note Call from Patient   Caller: Patient Summary of Call: [place]  Call back [phone]  pt said she has a question abot her Metoprolol---I asked the pt waht her question was and she didn't want to tell me. Initial call taken by: Vanessa Martinique,  July 06, 2008 2:09 PM  Follow-up for Phone Call        Spoke with patient and since patient has been on metoprolol she feels like she is gaining weight, just finished a boot camp and has gained 2 pounds and would like to know if there is another medication she can take?  Carley Hammed  July 06, 2008 2:30 PM  Follow-up by: Carley Hammed,  July 06, 2008 2:30 PM  Additional Follow-up for Phone Call Additional follow up Details #1::        stop toprol--- start norvasc 5 mg 1 by mouth once daily ------  ov in 2 weeks Additional Follow-up by: Garnet Koyanagi DO,  July 06, 2008 4:52 PM    Additional Follow-up for Phone Call Additional follow up Details #2::    Spoke with pt informed per Dr Etter Sjogren d/c Toprol and start [employer] ov in 1 weeks pt said she is going on vacation for 2 weeks will schedule whens he gets back.Verdie Mosher  July 06, 2008 4:56 PM  Follow-up by: Verdie Mosher,  July 06, 2008 4:56 PM  New/Updated Medications: NORVASC 5 MG  TABS (AMLODIPINE BESYLATE) 1 by mouth once daily   Prescriptions: NORVASC 5 MG  TABS (AMLODIPINE BESYLATE) 1 by mouth once daily  #30 x 0   Entered by:   Verdie Mosher   Authorized by:   Garnet Koyanagi DO   Signed by:   Verdie Mosher on 07/06/2008   Method used:   Faxed to ...       [facility]*       [address], [place]  [address]       Ph: [phone]       Fax: [phone]   RxID:   [id number]

## 2011-01-26 NOTE — Progress Notes (Signed)
Summary: Refill Request  Phone Note Refill Request Call back at [phone] Message from:  Pharmacy on August 08, 2010 9:35 AM  Refills Requested: Medication #1:  ZOCOR 20 MG TABS 1 by mouth at bedtime.   Dosage confirmed as above?Dosage Confirmed   Supply Requested: 1 month   Last Refilled: 07/04/2010 [facility]  Next Appointment Scheduled: 08/23/10 Initial call taken by: Osborn Coho,  August 08, 2010 9:35 AM    Prescriptions: ZOCOR 20 MG TABS (SIMVASTATIN) 1 by mouth at bedtime  #30 x 0   Entered by:   Rolla Flatten CMA   Authorized by:   Garnet Koyanagi DO   Signed by:   Rolla Flatten CMA on 08/08/2010   Method used:   Faxed to ...       [facility]* (retail)       [address] [place], [place]  [address]       Ph: [phone]       Fax: [phone]   RxID:   [id number]

## 2011-01-26 NOTE — Progress Notes (Signed)
Summary: pt will call to sch cpx  Phone Note Call from Patient   Caller: Patient Summary of Call: patient scheduled lab appt [id number] @ 8:40 - ov [id number] - need lab orderl Initial call taken by: Arbie Cookey [place],  Apr 28, 2010 9:34 AM  Follow-up for Phone Call        is she scheduled for cpe soon or is it just for repeat labs?  Doesn't look like she has had cpe in a while.  If just f/u 272.4 401.9  bmp, hep, lipid Follow-up by: Garnet Koyanagi DO,  Apr 28, 2010 11:27 AM  Additional Follow-up for Phone Call Additional follow up Details #1::        lm am for patient to call chg appt to cpx  patient called to schedule appt (acute) scheduled [id number] - patient had labs - she will stop by after acute appt & schedule cpx   Additional Follow-up by: Carol Spring,  May 04, 2010 9:36 AM    Additional Follow-up for Phone Call Additional follow up Details #2::    patient doesnt want to have cpx at this time - she is changing ins  Follow-up by: Carol Spring,  May 06, 2010 3:29 PM

## 2011-01-26 NOTE — Letter (Signed)
Summary: Results Follow-up Letter  [facility]   [place], [place] [address]   Phone: [phone]  Fax: [phone]    08/27/2008        [address] [place], [place]  [address]  Dear Ms. Mckeone,   The following are the results of your recent test(s):  Test     Result     Pap Smear    Normal_______  Not Normal_____       Comments: _________________________________________________________ Cholesterol LDL(Bad cholesterol):          Your goal is less than:         HDL (Good cholesterol):        Your goal is more than: _________________________________________________________ Other Tests:   _________________________________________________________  Please call for an appointment Or _Please see attached labwork.________________________________________________________ _________________________________________________________ _________________________________________________________  Sincerely,  Carley Hammed [place] [facility]

## 2011-01-26 NOTE — Assessment & Plan Note (Signed)
Summary: frequent urination/cbs   Vital Signs:  Patient profile:   45 year old female Height:      64.75 inches Weight:      200.4 pounds BMI:     33.73 Temp:     98.1 degrees F oral Pulse rate:   80 / minute Pulse rhythm:   regular BP sitting:   130 / 82  (left arm)  Vitals Entered By: Aron Baba CMA Deborra Medina) (September 19, 2010 12:57 PM) CC: c/o frequent urination and pelvic pain, Dysuria   History of Present Illness:  Dysuria      This is a 45 year old woman who presents with Dysuria.  The symptoms began 1 week ago.  Pt here c/o urinary frequency .  The patient complains of burning with urination and urinary frequency, but denies urgency, hematuria, vaginal discharge, vaginal itching, vaginal sores, and penile discharge.  Associated symptoms include abdominal pain.  The patient denies the following associated symptoms: nausea, vomiting, fever, shaking chills, flank pain, back pain, pelvic pain, and arthralgias.  The patient denies the following risk factors: diabetes, prior antibiotics, immunosuppression, history of GU anomaly, history of pyelonephritis, pregnancy, history of STD, and analgesic abuse.  History is significant for kidney stones.    Current Medications (verified): 1)  Spironolactone 25 Mg  Tabs (Spironolactone) .Marland Kitchen.. 1 By Mouth Once Daily 2)  Diovan 320 Mg Tabs (Valsartan) .Marland Kitchen.. 1 By Mouth Once Daily 3)  Zyrtec Allergy 10 Mg  Tabs (Cetirizine Hcl) .Marland Kitchen.. 1 By Mouth Once Daily 4)  Lovaza 1 Gm Caps (Omega-3-Acid Ethyl Esters) .... Take 2 Capsules in The Morning and 2 Capsules in The Evening. 5)  Bcp 6)  Norvasc 5 Mg Tabs (Amlodipine Besylate) .Marland Kitchen.. 1 By Mouth At Night. 7)  Vitamin B-12 1000 U 8)  Loestrin Fe 1/20 1-20 Mg-Mcg Tabs (Norethin Ace-Eth Estrad-Fe) 9)  Zocor 20 Mg Tabs (Simvastatin) .Marland Kitchen.. 1 By Mouth At Bedtime 10)  Cipro 500 Mg Tabs (Ciprofloxacin Hcl) .Marland Kitchen.. 1 By Mouth Two Times A Day  Allergies (verified): No Known Drug Allergies  Past History:  Past  medical, surgical, family and social histories (including risk factors) reviewed for relevance to current acute and chronic problems.  Past Medical History: Reviewed history from 11/05/2007 and no changes required. Hypertension Hyperlipidemia  Past Surgical History: Reviewed history from 05/15/2007 and no changes required. LEEP LITHOTRIPSY-KIDNEY STONE  Family History: Reviewed history from 09/06/2010 and no changes required. father--alz dementia  Family History High cholesterol Family History Hypertension Family History Diabetes 1st degree relative  Social History: Reviewed history from 09/06/2010 and no changes required. Occupation: babysit Married Never Smoked Alcohol use-no Drug use-no Regular exercise-yes  Review of Systems      See HPI  Physical Exam  General:  Well-developed,well-nourished,in no acute distress; alert,appropriate and cooperative throughout examination Psych:  Cognition and judgment appear intact. Alert and cooperative with normal attention span and concentration. No apparent delusions, illusions, hallucinations   Impression & Recommendations:  Problem # 1:  DYSURIA (ICD-788.1)  Her updated medication list for this problem includes:    Cipro 500 Mg Tabs (Ciprofloxacin hcl) .Marland Kitchen... 1 by mouth two times a day  Orders: T-Culture, Urine (73710-62694) UA Dipstick w/o Micro (manual) (81002)  Encouraged to push clear liquids, get enough rest, and take acetaminophen as needed. To be seen in 10 days if no improvement, sooner if worse.  Problem # 2:  HEMATURIA, HX OF (ICD-V13.09)  hx kidney stones--pt with no pain now strain urine if pain develops rto  Orders: UA Dipstick w/o Micro (manual) (81002)  Complete Medication List: 1)  Spironolactone 25 Mg Tabs (Spironolactone) .Marland Kitchen.. 1 by mouth once daily 2)  Diovan 320 Mg Tabs (Valsartan) .Marland Kitchen.. 1 by mouth once daily 3)  Zyrtec Allergy 10 Mg Tabs (Cetirizine hcl) .Marland Kitchen.. 1 by mouth once daily 4)  Lovaza 1  Gm Caps (Omega-3-acid ethyl esters) .... Take 2 capsules in the morning and 2 capsules in the evening. 5)  Bcp  6)  Norvasc 5 Mg Tabs (Amlodipine besylate) .Marland Kitchen.. 1 by mouth at night. 7)  Vitamin B-12 1000 U  8)  Loestrin Fe 1/20 1-20 Mg-mcg Tabs (Norethin ace-eth estrad-fe) 9)  Zocor 20 Mg Tabs (Simvastatin) .Marland Kitchen.. 1 by mouth at bedtime 10)  Cipro 500 Mg Tabs (Ciprofloxacin hcl) .Marland Kitchen.. 1 by mouth two times a day Prescriptions: CIPRO 500 MG TABS (CIPROFLOXACIN HCL) 1 by mouth two times a day  #10 x 0   Entered and Authorized by:   Garnet Koyanagi DO   Signed by:   Garnet Koyanagi DO on 09/19/2010   Method used:   Electronically to        [facility] (retail)       [address] [place], [place]  [address]       Ph: [phone]       Fax: [phone]   RxID:   [id number]   Laboratory Results   Urine Tests    Routine Urinalysis   Color: yellow Appearance: Hazy Glucose: negative   (Normal Range: Negative) Bilirubin: negative   (Normal Range: Negative) Ketone: negative   (Normal Range: Negative) Spec. Gravity: 1.025   (Normal Range: 1.003-1.035) Blood: large   (Normal Range: Negative) pH: 5.0   (Normal Range: 5.0-8.0) Protein: >=300   (Normal Range: Negative) Urobilinogen: 0.2   (Normal Range: 0-1) Nitrite: negative   (Normal Range: Negative) Leukocyte Esterace: trace   (Normal Range: Negative)

## 2011-01-26 NOTE — Progress Notes (Signed)
Summary: [facility]--thyroid check  Phone Note Call from Patient Call back at Home Phone [phone]   Caller: Patient Summary of Call: Pt calling wanting to have her thyroid check. Need and Order Initial call taken by: Velora Heckler,  April 16, 2008 2:53 PM  Follow-up for Phone Call        Dr. Etter Sjogren Is this okay?  ...................................................................Carley Hammed  April 16, 2008 3:01 PM  Follow-up by: Carley Hammed,  April 16, 2008 3:01 PM  Additional Follow-up for Phone Call Additional follow up Details #1::        yes Additional Follow-up by: Garnet Koyanagi DO,  April 16, 2008 3:02 PM    Additional Follow-up for Phone Call Additional follow up Details #2::    TSH 244.9 ...................................................................Carley Hammed  April 16, 2008 3:16 PM  Follow-up by: Carley Hammed,  April 16, 2008 3:17 PM  Pt made appt for Mon. ..................................................................Marland KitchenVelora Heckler  April 17, 2008 8:45 AM

## 2011-01-26 NOTE — Assessment & Plan Note (Signed)
Summary: to review labs--tl   Vital Signs:  Patient Profile:   45 Years Old Female Weight:      198 pounds Pulse rate:   78 / minute BP sitting:   118 / 80  (left arm)  Vitals Entered By: Malachi Bonds (February 18, 2008 10:37 AM)                 PCP:  Etter Sjogren  Chief Complaint:  discuss labs.  History of Present Illness:  Hypertension Follow-Up      This is a 45 year old woman who presents for Hypertension follow-up.  The patient denies lightheadedness, urinary frequency, headaches, edema, impotence, rash, and fatigue.  The patient denies the following associated symptoms: chest pain, chest pressure, exercise intolerance, dyspnea, palpitations, syncope, leg edema, and pedal edema.  Compliance with medications (by patient report) has been near 100%.  The patient reports that dietary compliance has been good.  The patient reports exercising 3-4X per week.  Adjunctive measures currently used by the patient include salt restriction.    Hyperlipidemia Follow-Up      The patient also presents for Hyperlipidemia follow-up.  The patient denies muscle aches, GI upset, abdominal pain, flushing, itching, constipation, diarrhea, and fatigue.  The patient denies the following symptoms: chest pain/pressure, exercise intolerance, dypsnea, palpitations, syncope, and pedal edema.  Compliance with medications (by patient report) has been near 100%.  Dietary compliance has been good.  The patient reports exercising 3-4X per week.    Pt also c/o low back pain-- Pt seeing chiropracter and is not any better.      Current Allergies: No known allergies   Past Medical History:    Reviewed history from 11/05/2007 and no changes required:       Hypertension       Hyperlipidemia     Review of Systems      See HPI   Physical Exam  General:     Well-developed,well-nourished,in no acute distress; alert,appropriate and cooperative throughout examination Lungs:     Normal respiratory effort, chest  expands symmetrically. Lungs are clear to auscultation, no crackles or wheezes. Heart:     Normal rate and regular rhythm. S1 and S2 normal without gallop, murmur, click, rub or other extra sounds. Extremities:     No clubbing, cyanosis, edema, or deformity noted with normal full range of motion of all joints.   Skin:     Intact without suspicious lesions or rashes    Impression & Recommendations:  Problem # 1:  HYPERLIPIDEMIA (KDX-833.4) reviewed NMR with pt  Her updated medication list for this problem includes:    Crestor 10 Mg Tabs (Rosuvastatin calcium) .Marland Kitchen... 1 by mouth at bedtime  Labs Reviewed: SGOT: 17 (10/08/2007)   SGPT: 17 (10/08/2007)   Problem # 2:  HYPERTENSION (ICD-401.9)  Her updated medication list for this problem includes:    Spironolactone 25 Mg Tabs (Spironolactone) .Marland Kitchen... 1 by mouth once daily    Diovan 320 Mg Tabs (Valsartan) .Marland Kitchen... 1 by mouth once daily    Toprol Xl 25 Mg Tb24 (Metoprolol succinate) .Marland Kitchen... 1 by mouth q day  BP today: 118/80 Prior BP: 120/82 (11/19/2007)  Labs Reviewed: Creat: 0.7 (08/15/2007)   Problem # 3:  BACK STRAIN, LUMBAR (ICD-847.2) con't with stretching Flexeril 5 mg three times a day as needed check xray if no better Orders: T-Lumbar Spine Complete, 5 Views ([id number])   Problem # 4:  ANXIETY STATE NOS (ICD-300.00) decreased dose at pt request Her updated medication  list for this problem includes:    Wellbutrin Xl 150 Mg Tb24 (Bupropion hcl) .Marland Kitchen... 1 by mouth once daily   Complete Medication List: 1)  Wellbutrin Xl 150 Mg Tb24 (Bupropion hcl) .Marland Kitchen.. 1 by mouth once daily 2)  Spironolactone 25 Mg Tabs (Spironolactone) .Marland Kitchen.. 1 by mouth once daily 3)  Yasmin 28 Tabs (Drospirenone-ethinyl estradiol tabs) .Marland Kitchen.. 1 by mouth once daily as directed 4)  Diovan 320 Mg Tabs (Valsartan) .Marland Kitchen.. 1 by mouth once daily 5)  Crestor 10 Mg Tabs (Rosuvastatin calcium) .Marland Kitchen.. 1 by mouth at bedtime 6)  Zyrtec Allergy 10 Mg Tabs (Cetirizine hcl) .Marland Kitchen..  1 by mouth once daily 7)  Toprol Xl 25 Mg Tb24 (Metoprolol succinate) .Marland Kitchen.. 1 by mouth q day 8)  Flexeril 5 Mg Tabs (Cyclobenzaprine hcl) .Marland Kitchen.. 1 by mouth three times a day   Patient Instructions: 1)  schedule fasting lipid, hep  v20.2    Prescriptions: FLEXERIL 5 MG  TABS (CYCLOBENZAPRINE HCL) 1 by mouth three times a day  #30 x 0   Entered and Authorized by:   Garnet Koyanagi DO   Signed by:   Garnet Koyanagi DO on 02/18/2008   Method used:   Print then Give to Patient   RxID:   [id number]  ]

## 2011-01-26 NOTE — Assessment & Plan Note (Signed)
Summary: back problems//lch   Vital Signs:  Patient profile:   45 year old female Weight:      200 pounds Pulse rate:   62 / minute BP sitting:   132 / 90  (left arm)  Vitals Entered By: Malachi Bonds (April 01, 2010 11:36 AM) CC: Back Problems started x1 wk ago at gym now worse    History of Present Illness: 45 yo woman here today for back pain.  pain started after using a new machine at the gym 9 days ago.  initially controlled w/ ibuprofen, yesterday pain worsened.  pt had been continuing to workout despite the pain.  pain is R sided lumbar.  no radiation of pain.  will have some spasm intermittantly.  no numbness or tingling, weakness.  has been using heat w/ some relief.  no incontinence.  Current Medications (verified): 1)  Spironolactone 25 Mg  Tabs (Spironolactone) .Marland Kitchen.. 1 By Mouth Once Daily 2)  Diovan 320 Mg Tabs (Valsartan) .Marland Kitchen.. 1 By Mouth Once Daily 3)  Zyrtec Allergy 10 Mg  Tabs (Cetirizine Hcl) .Marland Kitchen.. 1 By Mouth Once Daily 4)  Lovaza 1 Gm Caps (Omega-3-Acid Ethyl Esters) .... Take 2 Capsules in The Morning and 2 Capsules in The Evening. 5)  Bcp 6)  Norvasc 5 Mg Tabs (Amlodipine Besylate) .Marland Kitchen.. 1 By Mouth At Night. 7)  Vitamin B-12 1000 U 8)  Flexeril 10 Mg Tabs (Cyclobenzaprine Hcl) .Marland Kitchen.. 1 By Mouth Three Times A Day 9)  Nortrel .... Bcp Not Sure of Dose 10)  Ibuprofen 800 Mg Tabs (Ibuprofen) .Marland Kitchen.. 1 Tab By Mouth Q8 For 5-7 Days and Then As Needed.  Take W/ Food. 11)  Cyclobenzaprine Hcl 10 Mg  Tabs (Cyclobenzaprine Hcl) .Marland Kitchen.. 1 By Mouth Tid As Needed For Back Pain 12)  Vicodin 5-500 Mg Tabs (Hydrocodone-Acetaminophen) .Marland Kitchen.. 1 Tab By Mouth Q4-6 As Needed For Severe Pain  Allergies (verified): No Known Drug Allergies  Review of Systems      See HPI  Physical Exam  General:  Well-developed,well-nourished,in no acute distress; alert,appropriate and cooperative throughout examination Msk:  + TTP over L lumbar area.  good flexion, extension.  (-) SLR. Pulses:  +2 DP/PT  pulses Extremities:  no C/C/E Neurologic:  strength normal in all extremities, sensation intact to light touch, gait normal, and DTRs symmetrical and normal.     Impression & Recommendations:  Problem # 1:  BACK PAIN (ICD-724.5) Assessment New pt's pain likely a muscle strain.  no red flags on hx or PE.  regular NSAIDs, narcotics and muscle relaxers as needed.  reviewed supportive care and red flags that should prompt return.  Pt expresses understanding and is in agreement w/ this plan. Her updated medication list for this problem includes:    Flexeril 10 Mg Tabs (Cyclobenzaprine hcl) .Marland Kitchen... 1 by mouth three times a day    Ibuprofen 800 Mg Tabs (Ibuprofen) .Marland Kitchen... 1 tab by mouth q8 for 5-7 days and then as needed.  take w/ food.    Cyclobenzaprine Hcl 10 Mg Tabs (Cyclobenzaprine hcl) .Marland Kitchen... 1 by mouth tid as needed for back pain    Vicodin 5-500 Mg Tabs (Hydrocodone-acetaminophen) .Marland Kitchen... 1 tab by mouth q4-6 as needed for severe pain  Complete Medication List: 1)  Spironolactone 25 Mg Tabs (Spironolactone) .Marland Kitchen.. 1 by mouth once daily 2)  Diovan 320 Mg Tabs (Valsartan) .Marland Kitchen.. 1 by mouth once daily 3)  Zyrtec Allergy 10 Mg Tabs (Cetirizine hcl) .Marland Kitchen.. 1 by mouth once daily 4)  Lovaza  1 Gm Caps (Omega-3-acid ethyl esters) .... Take 2 capsules in the morning and 2 capsules in the evening. 5)  Bcp  6)  Norvasc 5 Mg Tabs (Amlodipine besylate) .Marland Kitchen.. 1 by mouth at night. 7)  Vitamin B-12 1000 U  8)  Flexeril 10 Mg Tabs (Cyclobenzaprine hcl) .Marland Kitchen.. 1 by mouth three times a day 9)  Nortrel  .... Bcp not sure of dose 10)  Ibuprofen 800 Mg Tabs (Ibuprofen) .Marland Kitchen.. 1 tab by mouth q8 for 5-7 days and then as needed.  take w/ food. 11)  Cyclobenzaprine Hcl 10 Mg Tabs (Cyclobenzaprine hcl) .Marland Kitchen.. 1 by mouth tid as needed for back pain 12)  Vicodin 5-500 Mg Tabs (Hydrocodone-acetaminophen) .Marland Kitchen.. 1 tab by mouth q4-6 as needed for severe pain  Patient Instructions: 1)  This is likely a muscle strain and should improve w/  time 2)  Take the ibuprofen as directed- avoid Motrin, Aleve, aspirin, etc (these are all cousins of the same thing) 3)  Take the Vicodin as needed for severe pain.  This has tylenol in it so avoid taking them together 4)  Use the Flexeril as needed- this will cause drowsiness so best at night 5)  Listen to your body- if it feels good do it, if it feels bad don't 6)  Hang in there!!! Prescriptions: VICODIN 5-500 MG TABS (HYDROCODONE-ACETAMINOPHEN) 1 tab by mouth Q4-6 as needed for severe pain  #20 x 0   Entered and Authorized by:   Annye Asa MD   Signed by:   Annye Asa MD on 04/01/2010   Method used:   Print then Give to Patient   RxID:   [id number] CYCLOBENZAPRINE HCL 10 MG  TABS (CYCLOBENZAPRINE HCL) 1 by mouth tid as needed for back pain  #30 x 0   Entered and Authorized by:   Annye Asa MD   Signed by:   Annye Asa MD on 04/01/2010   Method used:   Print then Give to Patient   RxID:   [id number] IBUPROFEN 800 MG TABS (IBUPROFEN) 1 tab by mouth Q8 for 5-7 days and then as needed.  take w/ food.  #60 x 0   Entered and Authorized by:   Annye Asa MD   Signed by:   Annye Asa MD on 04/01/2010   Method used:   Print then Give to Patient   RxID:   [id number]

## 2011-01-26 NOTE — Assessment & Plan Note (Signed)
Summary: RASH ON BOTH FEET/RH.......Marland Kitchen   Vital Signs:  Patient profile:   45 year old female Weight:      200.0 pounds Temp:     98.8 degrees F oral BP sitting:   132 / 80  (left arm) Cuff size:   large  Vitals Entered By: Aron Baba CMA Deborra Medina) (November 24, 2010 11:27 AM) CC: c/o rash on the bottom of the feet x2days   History of Present Illness: Pt here c/o rash that is on the bottom of her feet and is very itchy. Pt has used cortisone cream with relief from itching.     Current Medications (verified): 1)  Spironolactone 25 Mg  Tabs (Spironolactone) .Marland Kitchen.. 1 By Mouth Once Daily 2)  Diovan 320 Mg Tabs (Valsartan) .Marland Kitchen.. 1 By Mouth Once Daily 3)  Zyrtec Allergy 10 Mg  Tabs (Cetirizine Hcl) .Marland Kitchen.. 1 By Mouth Once Daily 4)  Lovaza 1 Gm Caps (Omega-3-Acid Ethyl Esters) .... Take 2 Capsules in The Morning and 2 Capsules in The Evening. 5)  Bcp 6)  Norvasc 5 Mg Tabs (Amlodipine Besylate) .Marland Kitchen.. 1 By Mouth At Night. 7)  Vitamin B-12 1000 U 8)  Loestrin Fe 1/20 1-20 Mg-Mcg Tabs (Norethin Ace-Eth Estrad-Fe) 9)  Zocor 20 Mg Tabs (Simvastatin) .Marland Kitchen.. 1 By Mouth At Bedtime 10)  Lotrisone 1-0.05 % Crea (Clotrimazole-Betamethasone) .... Apply Two Times A Day  Allergies (verified): No Known Drug Allergies  Past History:  Past Medical History: Last updated: 11/05/2007 Hypertension Hyperlipidemia  Past Surgical History: Last updated: 05/15/2007 LEEP LITHOTRIPSY-KIDNEY STONE  Family History: Last updated: 09/06/2010 father--alz dementia  Family History High cholesterol Family History Hypertension Family History Diabetes 1st degree relative  Social History: Last updated: 09/06/2010 Occupation: babysit Married Never Smoked Alcohol use-no Drug use-no Regular exercise-yes  Risk Factors: Alcohol Use: 0 (09/06/2010) Caffeine Use: 1 (09/06/2010) Exercise: yes (09/06/2010)  Risk Factors: Smoking Status: never (09/06/2010)  Family History: Reviewed history from 09/06/2010 and no  changes required. father--alz dementia  Family History High cholesterol Family History Hypertension Family History Diabetes 1st degree relative  Social History: Reviewed history from 09/06/2010 and no changes required. Occupation: babysit Married Never Smoked Alcohol use-no Drug use-no Regular exercise-yes  Review of Systems      See HPI  Physical Exam  General:  Well-developed,well-nourished,in no acute distress; alert,appropriate and cooperative throughout examination Skin:  papular rash on bottom of foot and errythema  --B/L  with some swelling Psych:  Oriented X3 and normally interactive.     Impression & Recommendations:  Problem # 1:  TINEA PEDIS (ICD-110.4)  Her updated medication list for this problem includes:    Lotrisone 1-0.05 % Crea (Clotrimazole-betamethasone) .Marland Kitchen... Apply two times a day  Take medication as directed for full duration.   Complete Medication List: 1)  Spironolactone 25 Mg Tabs (Spironolactone) .Marland Kitchen.. 1 by mouth once daily 2)  Diovan 320 Mg Tabs (Valsartan) .Marland Kitchen.. 1 by mouth once daily 3)  Zyrtec Allergy 10 Mg Tabs (Cetirizine hcl) .Marland Kitchen.. 1 by mouth once daily 4)  Lovaza 1 Gm Caps (Omega-3-acid ethyl esters) .... Take 2 capsules in the morning and 2 capsules in the evening. 5)  Bcp  6)  Norvasc 5 Mg Tabs (Amlodipine besylate) .Marland Kitchen.. 1 by mouth at night. 7)  Vitamin B-12 1000 U  8)  Loestrin Fe 1/20 1-20 Mg-mcg Tabs (Norethin ace-eth estrad-fe) 9)  Zocor 20 Mg Tabs (Simvastatin) .Marland Kitchen.. 1 by mouth at bedtime 10)  Lotrisone 1-0.05 % Crea (Clotrimazole-betamethasone) .... Apply two times a  day Prescriptions: LOTRISONE 1-0.05 % CREA (CLOTRIMAZOLE-BETAMETHASONE) apply two times a day  #30 g x 1   Entered and Authorized by:   Garnet Koyanagi DO   Signed by:   Garnet Koyanagi DO on 11/24/2010   Method used:   Electronically to        [facility] (retail)       [address] [place], [place]  [address]       Ph:  [phone]       Fax: [phone]   RxID:   [id number]    Orders Added: 1)  Est. Patient Level III [75102]

## 2011-02-01 ENCOUNTER — Telehealth (INDEPENDENT_AMBULATORY_CARE_PROVIDER_SITE_OTHER): Payer: Self-pay | Admitting: *Deleted

## 2011-02-09 NOTE — Progress Notes (Addendum)
Summary: refill   Phone Note Refill Request Message from:  Fax from Pharmacy on February 01, 2011 1:31 PM  Refills Requested: Medication #1:  NORVASC 5 MG TABS 1 by mouth at night.  Medication #2:  SPIRONOLACTONE 25 MG  TABS 1 by mouth once daily [facility] - fax [phone] ---- note 120 day = $15 generic durg program  Initial call taken by: Arbie [place],  February 01, 2011 1:32 PM    Prescriptions: NORVASC 5 MG TABS (AMLODIPINE BESYLATE) 1 by mouth at night.  #90 Tablet x 0   Entered by:   Malachi Bonds CMA   Authorized by:   Garnet Koyanagi DO   Signed by:   Malachi Bonds CMA on 02/01/2011   Method used:   Electronically to        [facility] (retail)       [address] [place], [place]  [address]       Ph: [phone]       Fax: [phone]   RxID:   [id number] SPIRONOLACTONE 25 MG  TABS (SPIRONOLACTONE) 1 by mouth once daily  #90 Tablet x 0   Entered by:   Malachi Bonds CMA   Authorized by:   Garnet Koyanagi DO   Signed by:   Malachi Bonds CMA on 02/01/2011   Method used:   Electronically to        [facility] (retail)       [address], [place]  [address]       Ph: [phone]       Fax: [phone]   RxID:   [id number]   Appended Document: refill     Prescriptions: NORVASC 5 MG TABS (AMLODIPINE BESYLATE) 1 by mouth at night.  #120 x 0   Entered by:   Rolla Flatten CMA   Authorized by:   Garnet Koyanagi DO   Signed by:   Rolla Flatten CMA on 02/10/2011   Method used:   Re-Faxed to ...       [facility] (retail)       [address], [place]  [address]       Ph: [phone]       Fax: [phone]   RxID:   [id number] SPIRONOLACTONE 25 MG  TABS (SPIRONOLACTONE) 1 by mouth once daily  #120 x 0   Entered by:   Rolla Flatten CMA   Authorized by:   Garnet Koyanagi DO   Signed by:   Rolla Flatten CMA on 02/10/2011  Method used:   Re-Faxed to ...       [facility] (retail)       [address] [place],   [address]       Ph: [phone]       Fax: [phone]   RxID:   [id number]

## 2011-02-10 ENCOUNTER — Telehealth (INDEPENDENT_AMBULATORY_CARE_PROVIDER_SITE_OTHER): Payer: Self-pay | Admitting: *Deleted

## 2011-02-15 NOTE — Progress Notes (Signed)
Summary: refill 120 day supply  Phone Note Refill Request   Refills Requested: Medication #1:  SPIRONOLACTONE 25 MG  TABS 1 by mouth once daily  Medication #2:  ZOCOR 20 MG TABS 1 by mouth at bedtime.  Medication #3:  NORVASC 5 MG TABS 1 by mouth at night. [facility] - fax [phone] - note customer would like to get an rx for a 120 day supply  Initial call taken by: Arbie [place],  February 10, 2011 11:26 AM    Prescriptions: NORVASC 5 MG TABS (AMLODIPINE BESYLATE) 1 by mouth at night.  #120 x 0   Entered by:   Aron Baba CMA ([place])   Authorized by:   Garnet Koyanagi DO   Signed by:   Aron Baba CMA ([place]) on 02/10/2011   Method used:   Faxed to ...       [facility] (retail)       [address], [place]  [address]       Ph: [phone]       Fax: [phone]   RxID:   [id number] ZOCOR 20 MG TABS (SIMVASTATIN) 1 by mouth at bedtime  #30 x 2   Entered by:   Aron Baba CMA ([place])   Authorized by:   Garnet Koyanagi DO   Signed by:   Aron Baba CMA ([place]) on 02/10/2011   Method used:   Faxed to ...       [facility] (retail)       [address], [place]  [address]       Ph: [phone]       Fax: [phone]   RxID:   [id number] SPIRONOLACTONE 25 MG  TABS (SPIRONOLACTONE) 1 by mouth once daily  #120 x 0   Entered by:   Aron Baba CMA ([place])   Authorized by:   Garnet Koyanagi DO   Signed by:   Aron Baba CMA ([place]) on 02/10/2011   Method used:   Faxed to ...       [facility] (retail)       [address], [place]  [address]       Ph: [phone]       Fax: [phone]   RxID:   [id number]

## 2011-05-12 NOTE — Op Note (Signed)
[facility]  Patient:    Lindsey [name], Lindsey [name]                    MRN: [id number] Proc. Date: 05/24/01 Attending:  Maisie Fus, M.D.                           Operative Report  PREOPERATIVE DIAGNOSIS:       Missed abortion.  POSTOPERATIVE DIAGNOSIS:      Missed abortion.  OPERATIVE PROCEDURE:          Dilatation and evacuation.  SURGEON:                      Maisie Fus, M.D.  ANESTHESIA:                   Managed intravenous sedation.  INTRAOPERATIVE COMPLICATIONS:                None.  BLOOD TYPE:                   Known to be B+.  ESTIMATED INTRAOPERATIVE BLOOD LOSS:                   Less than or equal to 50 cc.  INDICATIONS:                  The patient is a 45 year old, who was seen at approximately 6-8 weeks of gestation with a viable pregnancy.  By dates, she should have been 12-1/2 weeks on her recent visit 48 hours prior to this procedure.  No fetal heart was heard and ultrasound confirmed a nonviable intrauterine pregnancy.  She is admitted now for D&E.  DESCRIPTION OF PROCEDURE:     She was admitted on the morning of surgery.  She received IV sedation after arriving in the operating room.  A Betadine prep was carried.  A bivalve speculum was introduced.  The cervix was grasped with a single-tooth tenaculum.  The cervix was dilated to 27 [place].  A 9 mm curved suction cannula was introduced and aspiration produced obvious products of conception.  This was continued until it was felt that the cavity was evacuated.  Gentle curettage with a sharp curet and exploration with Randall Stone forceps and repeat vacuum aspiration confirmed complete evacuation of products of conception.  The procedure was terminated.  The patient was taken to recovery in good condition.  She will be given routine outpatient surgical instructions to use ibuprofen or Tylenol as needed for cramping pain and to return to the office in approximately 10  days. DD:  05/24/01 TD:  05/24/01 Job: [id number]

## 2011-05-12 NOTE — H&P (Signed)
Lindsey [name], [name]           ACCOUNT NO.:  [id number]   MEDICAL RECORD NO.:  [id number]          PATIENT TYPE:  INP   LOCATION:  2017                         FACILITY:  [facility]   PHYSICIAN:  Kathlene November, MD           DATE OF BIRTH:  1966-01-24   DATE OF ADMISSION:  08/03/2006  DATE OF DISCHARGE:                                HISTORY & PHYSICAL   CHIEF COMPLAINT:  Chest pain.   HISTORY OF THE PRESENT ILLNESS:  Lindsey [name] is a 45 year old white female  who came to the office complaining of pain at the chest.  The pain is  located under the left shoulder blade starting this morning.  Is sharp,  intense, it radiates to the anterior chest.  There is no associated  shortness of breath or cough.  The pain increased with deep breaths and  changed with certain positions.  The patient started birth control pills  recently, about 3 weeks ago.   PAST MEDICAL HISTORY:  1. Hypertension.  2. Depression.  3. High cholesterol.   FAMILY HISTORY:  No history of coronary artery disease.  She has  grandparents with a number of cancers including colon cancer, throat cancer  and ovarian cancer.   SOCIAL HISTORY:  The patient is married, has 3 kids.  She does not smoke and  drinks socially.   MEDICATION LIST:  1. New birth control pills 3 weeks ago.  2. Wellbutrin XL 300 mg one p.o. daily.  3. Zyrtec.  4. Advicor, dose unknown.  5. Toprol-XL 50 one p.o. daily.   REVIEW OF SYSTEMS:  She denies any fever, cough, anterior chest pain or  shortness of breath.  Denies any leg pain, leg swelling or recent airplane  trip.   ALLERGIES:  NO KNOWN DRUG ALLERGIES.   PHYSICAL EXAM:  The patient is alert, oriented, in no apparent distress.  LUNGS:  Clear to auscultation bilaterally.  CARDIOVASCULAR:  Regular rate and rhythm without a murmur.  CHEST:  Back palpation shows no tenderness.  ABDOMEN:  Is not distended, soft, good bowel sounds.  EXTREMITIES:  No edema.  NEUROLOGICAL EXAM:  Speech, gait  and motor are intact.  Motion of the torso  and chest caused no apparent distress.   LABORATORY AND XRAYS:  Pregnancy test is negative.  Chest x-ray is negative,  report is pending.  EKG shows sinus rhythm.   ASSESSMENT AND PLAN:  Lindsey [name] presents with a new onset of pleuritic  chest pain which seems to be intense.  I am concerned about this symptom  because of the recent initiation of birth control pills.  I do not have  obvious explanation to her symptoms and high on my list of possibilities is  pulmonary emboli.  Consequently, I will admit the patient to the hospital  until this can be securely ruled out.  Will get D-dimer and also a CT of the  chest.  I explained this to the patient and she seems to understand.      Kathlene November, MD  Electronically Signed     JP/MEDQ  D:  08/03/2006  T:  08/03/2006  Job:  [id number]

## 2011-05-12 NOTE — Discharge Summary (Signed)
[name], Lindsey [name]           ACCOUNT NO.:  [id number]   MEDICAL RECORD NO.:  [id number]          PATIENT TYPE:  INP   LOCATION:  2017                         FACILITY:  [facility]   PHYSICIAN:  Darrick Penna. Linna Darner, M.D. [facility] OF BIRTH:  04/19/1966   DATE OF ADMISSION:  08/03/2006  DATE OF DISCHARGE:  08/04/2006                                 DISCHARGE SUMMARY   ADMITTING DIAGNOSIS:  Chest pain.   DISCHARGE DIAGNOSIS:  Chest pain.   BRIEF HISTORY:  Lindsey [name] is a 45 year old female who presented August 03, 2006 with pleuritic pain on the left.  There is no past history of deep  venous thrombosis or pulmonary thromboemboli in her or her family.  She is a  former smoker.  She has been on Yaz, a birth control pill, from Dr. Dory Horn  for the past three weeks.   Chest x-ray was negative.  EKG was also normal.  Dr. Larose Kells placed her in the  hospital because of his concern of possible pulmonary thromboemboli.   The CT scan showed no central pulmonary thromboemboli.  No acute process was  noted.  Her D-dimer was normal at 0.41.  Glucose was elevated at 139 and  potassium was 3.2.   She is on no diuretics.  Her blood pressure was 119/75.  She denies any  intake of any diuretics in the form of supplements.  In addition to the Yaz,  medications include Wellbutrin XL, Zyrtec, Advicor and Toprol.   The morning of discharge, she stated I feel great, she had minimal left  pleuritic pain.  Her chest was clear with no rubs or rales.  She had regular  rhythm.  She was afebrile and had O2 sats of 95% on room air.   It was recommended that she take Aleve one to two every eight to 12 hours as  needed for the pleuritic pain.  It was recommended that she increase her  intake of Citrus products and also use the product No Salt which is  predominantly a potassium-containing salt substitute.  It is recommended  that she see Dr. Larose Kells within the next five to seven days and recheck the  potassium.   She is to return to the emergency room for admission should she have  progression of the chest pain or shortness of breath.  The hormonal therapy  is a low estrogen  dose and she is a nonsmoker.  In view of the negative  workup, continuation of the hormonal therapy will be deferred to Dr. Nori Riis.  A copy of this will be provided to him for continuity of care.   DISCHARGE STATUS:  Improved.   PROGNOSIS:  Good.   The dictation was done in her presence to facilitate continuity of care and  to verify that she understands the need to return to the hospital  immediately should she have progression or persistence of symptoms or the  presentation of new symptoms.      Darrick Penna. Linna Darner, M.D. [facility]  Electronically Signed     [facility]/MEDQ  D:  08/04/2006  T:  08/04/2006  Job:  [id number]  cc:   Colon Branch, MD [facility]  Maisie Fus, M.D.

## 2011-05-12 NOTE — H&P (Signed)
NAME:  Lindsey [name], Lindsey [name]                     ACCOUNT NO.:  [id number]   MEDICAL RECORD NO.:  [id number]                   PATIENT TYPE:  MAT   LOCATION:  [facility]                                 FACILITY:  [facility]   PHYSICIAN:  Maisie Fus, M.D.                DATE OF BIRTH:  1966/05/05   DATE OF PROCEDURE:  DATE OF DISCHARGE:                      STAT - MUST CHANGE TO CORRECT WORK TYPE   ADMISSION DIAGNOSES:  1. Intrauterine pregnancy 37-4/[redacted] weeks gestation.  2. Chronic hypertension.  3. Advanced cervical dilatation.  4. Short labor with last delivery.   HISTORY OF PRESENT ILLNESS:  The patient is a 45 year old, white, married  female, gravida 5, para 2 with chronic hypertension pregnancy induced  hypertension superimposed with her last pregnancy.  Was induced at [redacted] weeks  gestation for that reason.  This pregnancy, has been complicated by chronic  hypertension, but today was noted superimposed pregnancy-induced  hypertension.  At the present time her hypertension was managed with  labetalol 100 mg b.i.d. and Aldomet 500 mg b.i.d.  On the day prior to this  admission, which is at the time of dictation, the cervix was found to be 5  cm dilated, 75% effaced.  Vertex was presenting and -2 station.  Blood  pressure in the office was 132/86.   CURRENT REVIEW OF SYSTEMS:  Negative.   LABORATORY DATA:  Positive beta strep culture.  She also had [place] labs in  early July which were normal.   PAST MEDICAL HISTORY:  Recorded in the prenatal summary and will not be  repeated at this point.   PHYSICAL EXAMINATION:  HEENT:  Grossly within normal limits.  NECK:  Thyroid gland is not palpably enlarged.  VITAL SIGNS:  Blood pressure as noted above 132/86.  CHEST:  Clear to auscultation.,  HEART:  Normal sinus rhythm without murmurs, rubs or gallops.  ABDOMEN:  Gravid.  Estimated fetal size 8 pounds.  CERVIX:  As described above.  EXTREMITIES: Positive edema without cyanosis or clubbing.   ASSESSMENT:  Intrauterine pregnancy, 37-4/[redacted] weeks gestation, advanced  cervical change with concern over potential for out birth, chronic  hypertension adequately managed during pregnancy.   PLAN:  Artificial rupture of membranes for induction of labor.                                               Maisie Fus, M.D.    WRN/MEDQ  D:  08/01/2004  T:  08/01/2004  Job:  [id number]

## 2011-06-15 ENCOUNTER — Other Ambulatory Visit: Payer: Self-pay

## 2011-06-15 MED ORDER — AMLODIPINE BESYLATE 5 MG PO TABS: 5.0000 mg | ORAL_TABLET | Freq: Every evening | ORAL | Status: DC

## 2011-06-26 ENCOUNTER — Other Ambulatory Visit: Payer: Self-pay

## 2011-06-26 MED ORDER — SPIRONOLACTONE 25 MG PO TABS: 25.0000 mg | ORAL_TABLET | Freq: Every day | ORAL | Status: DC

## 2011-06-26 MED ORDER — SIMVASTATIN 20 MG PO TABS: 20.0000 mg | ORAL_TABLET | Freq: Every evening | ORAL | Status: DC

## 2011-06-26 NOTE — Telephone Encounter (Signed)
Patient is over due for labs---Letter mailed     KP

## 2011-07-03 ENCOUNTER — Other Ambulatory Visit: Payer: Self-pay | Admitting: Family Medicine

## 2011-07-03 DIAGNOSIS — R7989 Other specified abnormal findings of blood chemistry: Secondary | ICD-10-CM

## 2011-07-03 DIAGNOSIS — E785 Hyperlipidemia, unspecified: Secondary | ICD-10-CM

## 2011-07-04 ENCOUNTER — Other Ambulatory Visit: Payer: Self-pay

## 2011-07-19 ENCOUNTER — Other Ambulatory Visit: Payer: Self-pay | Admitting: Family Medicine

## 2011-07-19 DIAGNOSIS — E782 Mixed hyperlipidemia: Secondary | ICD-10-CM

## 2011-07-19 DIAGNOSIS — R7989 Other specified abnormal findings of blood chemistry: Secondary | ICD-10-CM

## 2011-07-20 ENCOUNTER — Other Ambulatory Visit (INDEPENDENT_AMBULATORY_CARE_PROVIDER_SITE_OTHER): Payer: BC Managed Care – PPO

## 2011-07-20 ENCOUNTER — Ambulatory Visit: Payer: Self-pay | Admitting: Family Medicine

## 2011-07-20 DIAGNOSIS — R7989 Other specified abnormal findings of blood chemistry: Secondary | ICD-10-CM

## 2011-07-20 DIAGNOSIS — E785 Hyperlipidemia, unspecified: Secondary | ICD-10-CM

## 2011-07-20 LAB — BASIC METABOLIC PANEL
BUN: 19 mg/dL (ref 6–23)
CO2: 25 mEq/L (ref 19–32)
Calcium: 8.7 mg/dL (ref 8.4–10.5)
Chloride: 102 mEq/L (ref 96–112)
Creatinine, Ser: 0.7 mg/dL (ref 0.4–1.2)
GFR: 94.7 mL/min (ref >60.00)
Glucose, Bld: 100 mg/dL — ABNORMAL HIGH (ref 70–99)
Potassium: 4.3 mEq/L (ref 3.5–5.1)
Sodium: 137 mEq/L (ref 135–145)

## 2011-07-20 LAB — LIPID PANEL
Cholesterol: 178 mg/dL (ref 0–200)
HDL: 61.9 mg/dL (ref >39.00)
LDL Cholesterol: 84 mg/dL (ref 0–99)
Total CHOL/HDL Ratio: 3
Triglycerides: 161 mg/dL — ABNORMAL HIGH (ref 0.0–149.0)
VLDL: 32.2 mg/dL (ref 0.0–40.0)

## 2011-07-20 LAB — HEPATIC FUNCTION PANEL
ALT: 15 U/L (ref 0–35)
AST: 20 U/L (ref 0–37)
Albumin: 3.8 g/dL (ref 3.5–5.2)
Alkaline Phosphatase: 66 U/L (ref 39–117)
Bilirubin, Direct: 0 mg/dL (ref 0.0–0.3)
Total Bilirubin: 0.4 mg/dL (ref 0.3–1.2)
Total Protein: 7.7 g/dL (ref 6.0–8.3)

## 2011-07-20 LAB — HEMOGLOBIN A1C: Hgb A1c MFr Bld: 6.2 % (ref 4.6–6.5)

## 2011-07-20 NOTE — Progress Notes (Signed)
Labs only

## 2011-08-07 ENCOUNTER — Encounter: Payer: Self-pay | Admitting: Family Medicine

## 2011-08-07 ENCOUNTER — Ambulatory Visit (INDEPENDENT_AMBULATORY_CARE_PROVIDER_SITE_OTHER): Payer: BC Managed Care – PPO | Admitting: Family Medicine

## 2011-08-07 VITALS — BP 130/90 | HR 72 | Wt 205.2 lb

## 2011-08-07 DIAGNOSIS — R300 Dysuria: Secondary | ICD-10-CM

## 2011-08-07 DIAGNOSIS — M549 Dorsalgia, unspecified: Secondary | ICD-10-CM

## 2011-08-07 DIAGNOSIS — I10 Essential (primary) hypertension: Secondary | ICD-10-CM

## 2011-08-07 DIAGNOSIS — E785 Hyperlipidemia, unspecified: Secondary | ICD-10-CM

## 2011-08-07 LAB — POCT URINALYSIS DIPSTICK
Bilirubin, UA: NEGATIVE
Glucose, UA: NEGATIVE
Ketones, UA: NEGATIVE
Nitrite, UA: NEGATIVE
Protein, UA: 300
Spec Grav, UA: 1.015
Urobilinogen, UA: 0.2
pH, UA: 5

## 2011-08-07 MED ORDER — CYCLOBENZAPRINE HCL 10 MG PO TABS: 10.0000 mg | ORAL_TABLET | Freq: Three times a day (TID) | ORAL | Status: AC | PRN

## 2011-08-07 NOTE — Patient Instructions (Signed)
Back Pain & Injury Your back pain is most likely caused by a strain of the muscles or ligaments supporting the spine. Back strains cause pain and trouble moving because of muscle spasms. They may take several weeks to heal. Usually they are better in days.  Treatment for back pain includes:  Rest - Get bed rest as needed over the next day or two. Use a firm mattress and lie on your side with your knees slightly bent. If you lie on your back, put a pillow under your knees.   Early movement - Back pain improves most rapidly if you remain active. It is much more stressful on the back to sit or stand in one place. Do not sit, drive or stand in one place for more than 30 minutes at a time. Take short walks on level surfaces as soon as pain allows.   Limit bending and lifting - Do not bend over or lift anything over 20 pounds until instructed otherwise. Lift by bending your knees. Use your leg muscles to help. Keep the load close to your body and avoid twisting. Do not reach or do overhead work.   Medicines - Medicine to reduce pain and inflammation are helpful. Muscle-relaxing drugs may be prescribed.   Therapy - Put ice packs on your back every few hours for the first 2-3 days after your injury or as instructed. After that ice or heat may be alternated to reduce pain and spasm. Back exercises and gentle massage may be of some benefit. You should be examined again if your back pain is not better in one week.  SEEK IMMEDIATE MEDICAL CARE IF:  You have pain that radiates from your back into your legs.   You develop new bowel or bladder control problems.   You have unusual weakness or numbness in your arms or legs.   You develop nausea or vomiting.   You develop abdominal pain.   You feel faint.  Document Released: 12/11/2005 Document Re-Released: 09/19/2008 [facility] Patient Information 2011 [place].

## 2011-08-07 NOTE — Progress Notes (Signed)
  Subjective:    Lindsey [name] is a 45 y.o. female who presents for evaluation of low back pain. The patient has had no prior back problems. Symptoms have been present for 3 months and are stable.  Onset was related to / precipitated by no known injury. The pain is located in the interscapular area and does not radiate. The pain is described as sharp and stabbing and occurs during sleep. She rates her pain as severe. Symptoms are exacerbated by nothing in particular. Symptoms are improved by heat and NSAIDs. She has also tried nothing which provided no symptom relief. She has urinary frequency, no burning associated with the back pain. The patient has no "red flag" history indicative of complicated back pain.  The following portions of the patient's history were reviewed and updated as appropriate: allergies, current medications, past family history, past medical history, past social history, past surgical history and problem list. Pt is also here to review her labs.  Review of Systems Pertinent items are noted in HPI.    Objective:   Inspection and palpation: paraspinal tenderness noted T4-7. Muscle tone and ROM exam: muscle spasm noted T4-7 on Right>Left. Straight leg raise: negative at 90 degrees bilaterally.    Assessment:    thoracic back pain  Urinary frequency----check culture HTN--- con't meds-- pt in pain today hyperlipidemi---labs reviewed with pt---con't meds   Plan:    Natural history and expected course discussed. Questions answered. Neurosurgeon distributed. Regular aerobic and trunk strengthening exercises discussed. Short (2-4 day) period of relative rest recommended until acute symptoms improve. Heat to affected area as needed for local pain relief. Muscle relaxants per medication orders. Follow-up in 2 weeks. f/u chiropracter

## 2011-08-09 LAB — URINE CULTURE
Colony Count: NO GROWTH
Organism ID, Bacteria: NO GROWTH

## 2011-09-13 ENCOUNTER — Other Ambulatory Visit: Payer: Self-pay | Admitting: Family Medicine

## 2011-10-06 ENCOUNTER — Other Ambulatory Visit: Payer: Self-pay | Admitting: Family Medicine

## 2011-10-25 ENCOUNTER — Other Ambulatory Visit: Payer: Self-pay | Admitting: Family Medicine

## 2011-10-25 MED ORDER — AMLODIPINE BESYLATE 5 MG PO TABS: 5.0000 mg | ORAL_TABLET | Freq: Every evening | ORAL | Status: DC

## 2011-10-25 MED ORDER — SPIRONOLACTONE 25 MG PO TABS: 25.0000 mg | ORAL_TABLET | Freq: Every day | ORAL | Status: DC

## 2011-10-25 NOTE — Telephone Encounter (Signed)
Rx faxed.    KP 

## 2011-11-09 ENCOUNTER — Telehealth: Payer: Self-pay | Admitting: Family Medicine

## 2011-11-09 NOTE — Telephone Encounter (Signed)
The pt called and stated she wants her cholesterol checked.  Can the dr order these labs?  Thanks!

## 2011-11-09 NOTE — Telephone Encounter (Signed)
I have left a msg for the patient to call the office--- these are the labs needed     790.6 Bmp, hgba1c, 272.4 Lipid, hep    KP

## 2011-11-13 NOTE — Telephone Encounter (Signed)
Patient returned call lab scheduled [phone]

## 2011-11-14 ENCOUNTER — Other Ambulatory Visit: Payer: Self-pay | Admitting: Family Medicine

## 2011-11-14 DIAGNOSIS — E785 Hyperlipidemia, unspecified: Secondary | ICD-10-CM

## 2011-11-14 DIAGNOSIS — R7989 Other specified abnormal findings of blood chemistry: Secondary | ICD-10-CM

## 2011-11-15 ENCOUNTER — Other Ambulatory Visit: Payer: BC Managed Care – PPO

## 2011-11-29 ENCOUNTER — Encounter: Payer: Self-pay | Admitting: Family Medicine

## 2011-11-29 ENCOUNTER — Ambulatory Visit (INDEPENDENT_AMBULATORY_CARE_PROVIDER_SITE_OTHER): Payer: BC Managed Care – PPO | Admitting: Family Medicine

## 2011-11-29 VITALS — BP 125/75 | HR 86 | Temp 98.7°F | Ht 64.5 in | Wt 192.0 lb

## 2011-11-29 DIAGNOSIS — J019 Acute sinusitis, unspecified: Secondary | ICD-10-CM

## 2011-11-29 NOTE — Assessment & Plan Note (Signed)
Pt has not given abx enough time to take effect.  Will not switch meds to avoid causing abx resistance.  Reviewed supportive care and red flags that should prompt return.  Pt expressed understanding and is in agreement w/ plan.

## 2011-11-29 NOTE — Patient Instructions (Signed)
Finish the Doxy as directed- take w/ food. Finish the steroids When visiting the hospital wear a mask Mucinex will thin your congestion Delsym as needed for cough REST! Hang in there!!!

## 2011-11-29 NOTE — Progress Notes (Signed)
  Subjective:    Patient ID: Lindsey [name], female    DOB: 03-Jan-1966, 45 y.o.   MRN: [id number]  HPI Sinus infxn- went to [place] on Monday and was started on Doxy and steroids for unknown dx.  Cough has loosened.  Has had 4 doses of abx and 2 doses of steroids.  'i'm not better'.   Review of Systems For ROS see HPI     Objective:   Physical Exam  Vitals reviewed. Constitutional: She appears well-developed and well-nourished. No distress.  HENT:  Head: Normocephalic and atraumatic.  Right Ear: Tympanic membrane normal.  Left Ear: Tympanic membrane normal.  Nose: Mucosal edema and rhinorrhea present. Right sinus exhibits maxillary sinus tenderness and frontal sinus tenderness. Left sinus exhibits maxillary sinus tenderness and frontal sinus tenderness.  Mouth/Throat: Uvula is midline and mucous membranes are normal. Posterior oropharyngeal erythema present. No oropharyngeal exudate.  Eyes: Conjunctivae and EOM are normal. Pupils are equal, round, and reactive to light.  Neck: Normal range of motion. Neck supple.  Cardiovascular: Normal rate, regular rhythm and normal heart sounds.   Pulmonary/Chest: Effort normal and breath sounds normal. No respiratory distress. She has no wheezes.  Lymphadenopathy:    She has no cervical adenopathy.          Assessment & Plan:

## 2011-12-20 ENCOUNTER — Other Ambulatory Visit (INDEPENDENT_AMBULATORY_CARE_PROVIDER_SITE_OTHER): Payer: BC Managed Care – PPO

## 2011-12-20 DIAGNOSIS — E785 Hyperlipidemia, unspecified: Secondary | ICD-10-CM

## 2011-12-20 DIAGNOSIS — R7989 Other specified abnormal findings of blood chemistry: Secondary | ICD-10-CM

## 2011-12-20 LAB — HEMOGLOBIN A1C: Hgb A1c MFr Bld: 5.9 % (ref 4.6–6.5)

## 2011-12-20 LAB — LDL CHOLESTEROL, DIRECT: Direct LDL: 123.2 mg/dL

## 2011-12-20 LAB — HEPATIC FUNCTION PANEL
ALT: 13 U/L (ref 0–35)
AST: 18 U/L (ref 0–37)
Albumin: 3.4 g/dL — ABNORMAL LOW (ref 3.5–5.2)
Alkaline Phosphatase: 66 U/L (ref 39–117)
Bilirubin, Direct: 0 mg/dL (ref 0.0–0.3)
Total Bilirubin: 0.4 mg/dL (ref 0.3–1.2)
Total Protein: 6.9 g/dL (ref 6.0–8.3)

## 2011-12-20 LAB — BASIC METABOLIC PANEL
BUN: 20 mg/dL (ref 6–23)
CO2: 28 mEq/L (ref 19–32)
Calcium: 9 mg/dL (ref 8.4–10.5)
Chloride: 104 mEq/L (ref 96–112)
Creatinine, Ser: 0.7 mg/dL (ref 0.4–1.2)
GFR: 101.06 mL/min (ref >60.00)
Glucose, Bld: 105 mg/dL — ABNORMAL HIGH (ref 70–99)
Potassium: 4.4 mEq/L (ref 3.5–5.1)
Sodium: 141 mEq/L (ref 135–145)

## 2011-12-20 LAB — LIPID PANEL
Cholesterol: 210 mg/dL — ABNORMAL HIGH (ref 0–200)
HDL: 60 mg/dL (ref >39.00)
Total CHOL/HDL Ratio: 4
Triglycerides: 167 mg/dL — ABNORMAL HIGH (ref 0.0–149.0)
VLDL: 33.4 mg/dL (ref 0.0–40.0)

## 2011-12-20 NOTE — Progress Notes (Signed)
LABS ONLY  

## 2011-12-27 ENCOUNTER — Other Ambulatory Visit: Payer: Self-pay | Admitting: Family Medicine

## 2011-12-29 MED ORDER — EZETIMIBE 10 MG PO TABS: 10.0000 mg | ORAL_TABLET | Freq: Every day | ORAL | Status: DC

## 2012-01-03 ENCOUNTER — Other Ambulatory Visit: Payer: Self-pay | Admitting: Family Medicine

## 2012-01-03 MED ORDER — AMLODIPINE BESYLATE 5 MG PO TABS: 5.0000 mg | ORAL_TABLET | Freq: Every evening | ORAL | Status: DC

## 2012-01-03 MED ORDER — SPIRONOLACTONE 25 MG PO TABS: 25.0000 mg | ORAL_TABLET | Freq: Every day | ORAL | Status: DC

## 2012-01-03 NOTE — Telephone Encounter (Signed)
Rx faxed.    KP 

## 2012-02-12 ENCOUNTER — Other Ambulatory Visit: Payer: Self-pay | Admitting: Family Medicine

## 2012-02-23 ENCOUNTER — Encounter: Payer: Self-pay | Admitting: Family Medicine

## 2012-02-23 ENCOUNTER — Ambulatory Visit (INDEPENDENT_AMBULATORY_CARE_PROVIDER_SITE_OTHER): Payer: BC Managed Care – PPO | Admitting: Family Medicine

## 2012-02-23 VITALS — BP 115/75 | HR 90 | Temp 98.4°F | Ht 64.25 in | Wt 192.6 lb

## 2012-02-23 DIAGNOSIS — L501 Idiopathic urticaria: Secondary | ICD-10-CM | POA: Insufficient documentation

## 2012-02-23 DIAGNOSIS — L509 Urticaria, unspecified: Secondary | ICD-10-CM

## 2012-02-23 LAB — CBC WITH DIFFERENTIAL/PLATELET
Basophils Absolute: 0.1 10*3/uL (ref 0.0–0.1)
Basophils Relative: 0.6 % (ref 0.0–3.0)
Eosinophils Absolute: 0.2 10*3/uL (ref 0.0–0.7)
Eosinophils Relative: 2 % (ref 0.0–5.0)
HCT: 41.5 % (ref 36.0–46.0)
Hemoglobin: 13.6 g/dL (ref 12.0–15.0)
Lymphocytes Relative: 29.2 % (ref 12.0–46.0)
Lymphs Abs: 3 10*3/uL (ref 0.7–4.0)
MCHC: 32.8 g/dL (ref 30.0–36.0)
MCV: 86.6 fl (ref 78.0–100.0)
Monocytes Absolute: 0.6 10*3/uL (ref 0.1–1.0)
Monocytes Relative: 6.3 % (ref 3.0–12.0)
Neutro Abs: 6.3 10*3/uL (ref 1.4–7.7)
Neutrophils Relative %: 61.9 % (ref 43.0–77.0)
Platelets: 300 10*3/uL (ref 150.0–400.0)
RBC: 4.8 Mil/uL (ref 3.87–5.11)
RDW: 13.6 % (ref 11.5–14.6)
WBC: 10.2 10*3/uL (ref 4.5–10.5)

## 2012-02-23 MED ORDER — TRIAMCINOLONE ACETONIDE 0.1 % EX OINT: TOPICAL_OINTMENT | Freq: Two times a day (BID) | CUTANEOUS | Status: AC

## 2012-02-23 MED ORDER — HYDROXYZINE HCL 50 MG PO TABS: 50.0000 mg | ORAL_TABLET | Freq: Three times a day (TID) | ORAL | Status: AC | PRN

## 2012-02-23 NOTE — Progress Notes (Signed)
  Subjective:    Patient ID: Lindsey [name], female    DOB: 06/25/1966, 46 y.o.   MRN: [id number]  HPI Rash- pt reports recurrent sxs.  sxs started ~6 months ago.  No one in household w/ similar itching.  Very itchy.  Now on flexor surface of R wrist.  Also has similar on back of L wrist and tops of feet.  No itching in the groin.  Current area started 2 days ago- reports she will 'always get a large welt'- raised, red, swollen.  This will migrate.  Itching will improve w/ benadryl.   Review of Systems For ROS see HPI     Objective:   Physical Exam  Vitals reviewed. Constitutional: She appears well-developed and well-nourished.  Skin: Skin is warm and dry. No rash (no lesions in interdigital webbing or groin) noted. There is erythema (and small area of induration on flexor surface of R wrist).          Assessment & Plan:

## 2012-02-23 NOTE — Patient Instructions (Signed)
This appears to be hives- cause unknown Start the hydroxyzine as needed for itching Use the steroid ointment as needed Hang in there!!!

## 2012-02-27 NOTE — Assessment & Plan Note (Signed)
New.  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  Start steroid ointment for sxs relief.  Add Zyrtec daily to decrease histamine response.  Reviewed supportive care and red flags that should prompt return.  Pt expressed understanding and is in agreement w/ plan.

## 2012-03-11 ENCOUNTER — Telehealth: Payer: Self-pay | Admitting: Family Medicine

## 2012-03-11 NOTE — Telephone Encounter (Signed)
Friday at 11 or at 2:45.     KP

## 2012-03-11 NOTE — Telephone Encounter (Signed)
Patient called stating she was having a lot of anxiety right now & would like to get something for it. I told her she would need to be seen & she wants a slot this week, unfortunately I do not have anything. I was wondering if you could review the schedule & let me know what if any time would be OK with Dr. Etter Sjogren for me to put her in this week? Patient PH# [phone]

## 2012-03-15 ENCOUNTER — Ambulatory Visit (INDEPENDENT_AMBULATORY_CARE_PROVIDER_SITE_OTHER): Payer: BC Managed Care – PPO | Admitting: Family Medicine

## 2012-03-15 ENCOUNTER — Ambulatory Visit: Payer: BC Managed Care – PPO | Admitting: Internal Medicine

## 2012-03-15 ENCOUNTER — Encounter: Payer: Self-pay | Admitting: Family Medicine

## 2012-03-15 VITALS — BP 142/82 | HR 91 | Temp 98.2°F | Wt 189.0 lb

## 2012-03-15 DIAGNOSIS — F341 Dysthymic disorder: Secondary | ICD-10-CM

## 2012-03-15 DIAGNOSIS — F419 Anxiety disorder, unspecified: Secondary | ICD-10-CM

## 2012-03-15 DIAGNOSIS — F32A Depression, unspecified: Secondary | ICD-10-CM

## 2012-03-15 DIAGNOSIS — Z0000 Encounter for general adult medical examination without abnormal findings: Secondary | ICD-10-CM

## 2012-03-15 LAB — TSH: TSH: 2.52 u[IU]/mL (ref 0.350–4.500)

## 2012-03-15 MED ORDER — FLUOXETINE HCL 10 MG PO CAPS: 10.0000 mg | ORAL_CAPSULE | Freq: Every day | ORAL | Status: DC

## 2012-03-15 MED ORDER — ALPRAZOLAM 0.25 MG PO TABS: 0.2500 mg | ORAL_TABLET | Freq: Two times a day (BID) | ORAL | Status: DC | PRN

## 2012-03-15 NOTE — Patient Instructions (Signed)

## 2012-03-15 NOTE — Progress Notes (Signed)
  Subjective:     Lindsey [name] is a 46 y.o. female who presents for new evaluation and treatment of anxiety disorder, panic attacks and depression. She has the following anxiety symptoms: palpitations, panic attacks and shortness of breath. Onset of symptoms was approximately 2 months ago. Symptoms have been gradually worsening since that time. She denies current suicidal and homicidal ideation. Family history significant for no psychiatric illness. Risk factors: negative life event death of father in law and now her dad is dying.. Previous treatment includes Lexapro and Xanax. She complains of the following medication side effects: none. The following portions of the patient's history were reviewed and updated as appropriate: allergies, current medications, past family history, past medical history, past social history, past surgical history and problem list.  Review of Systems Pertinent items are noted in HPI.    Objective:    BP 142/82  Pulse 91  Temp(Src) 98.2 F (36.8 C) (Oral)  Wt 189 lb (85.73 kg)  SpO2 96% General appearance: alert, cooperative, appears stated age and mild distress Heart: S1, S2 normal Neurologic: Alert and oriented X 3, normal strength and tone. Normal symmetric reflexes. Normal coordination and gait Mental status: Alert, oriented, thought content appropriate    Assessment:    anxiety disorder, panic attacks and depression. Possible organic contributing causes are: none.   Plan:    Medications: Prozac and Xanax. Labs: Comprehensive metabolic profile, CBC and TSH. Handouts describing disease, natural history, and treatment were given to the patient. Follow up: 1 months.

## 2012-03-15 NOTE — Progress Notes (Signed)
Addended by: Modena Morrow D on: 03/15/2012 03:46 PM   Modules accepted: Orders

## 2012-03-16 LAB — BASIC METABOLIC PANEL
BUN: 19 mg/dL (ref 6–23)
CO2: 23 mEq/L (ref 19–32)
Calcium: 8.6 mg/dL (ref 8.4–10.5)
Chloride: 102 mEq/L (ref 96–112)
Creat: 0.72 mg/dL (ref 0.50–1.10)
Glucose, Bld: 157 mg/dL — ABNORMAL HIGH (ref 70–99)
Potassium: 4 mEq/L (ref 3.5–5.3)
Sodium: 137 mEq/L (ref 135–145)

## 2012-03-16 LAB — LIPID PANEL
Cholesterol: 188 mg/dL (ref 0–200)
HDL: 53 mg/dL (ref >39)
LDL Cholesterol: 105 mg/dL — ABNORMAL HIGH (ref 0–99)
Total CHOL/HDL Ratio: 3.5 Ratio
Triglycerides: 151 mg/dL — ABNORMAL HIGH (ref <150)
VLDL: 30 mg/dL (ref 0–40)

## 2012-03-16 LAB — CBC WITH DIFFERENTIAL/PLATELET
Basophils Absolute: 0 10*3/uL (ref 0.0–0.1)
Basophils Relative: 0 % (ref 0–1)
Eosinophils Absolute: 0.3 10*3/uL (ref 0.0–0.7)
Eosinophils Relative: 3 % (ref 0–5)
HCT: 42.1 % (ref 36.0–46.0)
Hemoglobin: 13.4 g/dL (ref 12.0–15.0)
Lymphocytes Relative: 35 % (ref 12–46)
Lymphs Abs: 3.7 10*3/uL (ref 0.7–4.0)
MCH: 27.6 pg (ref 26.0–34.0)
MCHC: 31.8 g/dL (ref 30.0–36.0)
MCV: 86.8 fL (ref 78.0–100.0)
Monocytes Absolute: 0.7 10*3/uL (ref 0.1–1.0)
Monocytes Relative: 6 % (ref 3–12)
Neutro Abs: 5.9 10*3/uL (ref 1.7–7.7)
Neutrophils Relative %: 56 % (ref 43–77)
Platelets: 350 10*3/uL (ref 150–400)
RBC: 4.85 MIL/uL (ref 3.87–5.11)
RDW: 13.7 % (ref 11.5–15.5)
WBC: 10.5 10*3/uL (ref 4.0–10.5)

## 2012-03-16 LAB — URINALYSIS
Bilirubin Urine: NEGATIVE
Glucose, UA: NEGATIVE mg/dL
Ketones, ur: NEGATIVE mg/dL
Nitrite: NEGATIVE
Protein, ur: 100 mg/dL — AB
Specific Gravity, Urine: 1.023 (ref 1.005–1.030)
Urobilinogen, UA: 0.2 mg/dL (ref 0.0–1.0)
pH: 5.5 (ref 5.0–8.0)

## 2012-03-16 LAB — HEPATIC FUNCTION PANEL
ALT: 15 U/L (ref 0–35)
AST: 17 U/L (ref 0–37)
Albumin: 4.1 g/dL (ref 3.5–5.2)
Alkaline Phosphatase: 76 U/L (ref 39–117)
Bilirubin, Direct: <0.1 mg/dL (ref 0.0–0.3)
Total Bilirubin: 0.2 mg/dL — ABNORMAL LOW (ref 0.3–1.2)
Total Protein: 7.1 g/dL (ref 6.0–8.3)

## 2012-04-20 ENCOUNTER — Other Ambulatory Visit: Payer: Self-pay | Admitting: Family Medicine

## 2012-04-24 ENCOUNTER — Ambulatory Visit: Payer: BC Managed Care – PPO | Admitting: Family Medicine

## 2012-04-29 ENCOUNTER — Other Ambulatory Visit: Payer: Self-pay | Admitting: Family Medicine

## 2012-05-08 ENCOUNTER — Other Ambulatory Visit (INDEPENDENT_AMBULATORY_CARE_PROVIDER_SITE_OTHER): Payer: BC Managed Care – PPO

## 2012-05-08 DIAGNOSIS — Z0000 Encounter for general adult medical examination without abnormal findings: Secondary | ICD-10-CM

## 2012-05-08 LAB — URINALYSIS, ROUTINE W REFLEX MICROSCOPIC
Bilirubin Urine: NEGATIVE
Ketones, ur: NEGATIVE
Nitrite: NEGATIVE
Specific Gravity, Urine: >=1.03 (ref 1.000–1.030)
Total Protein, Urine: 100
Urine Glucose: NEGATIVE
Urobilinogen, UA: 0.2 (ref 0.0–1.0)
pH: 6 (ref 5.0–8.0)

## 2012-05-08 LAB — CBC WITH DIFFERENTIAL/PLATELET
Basophils Absolute: 0 10*3/uL (ref 0.0–0.1)
Basophils Relative: 0.5 % (ref 0.0–3.0)
Eosinophils Absolute: 0.2 10*3/uL (ref 0.0–0.7)
Eosinophils Relative: 2.2 % (ref 0.0–5.0)
HCT: 41.7 % (ref 36.0–46.0)
Hemoglobin: 13.6 g/dL (ref 12.0–15.0)
Lymphocytes Relative: 25.8 % (ref 12.0–46.0)
Lymphs Abs: 2.8 10*3/uL (ref 0.7–4.0)
MCHC: 32.7 g/dL (ref 30.0–36.0)
MCV: 85.7 fl (ref 78.0–100.0)
Monocytes Absolute: 0.8 10*3/uL (ref 0.1–1.0)
Monocytes Relative: 7.1 % (ref 3.0–12.0)
Neutro Abs: 7 10*3/uL (ref 1.4–7.7)
Neutrophils Relative %: 64.4 % (ref 43.0–77.0)
Platelets: 281 10*3/uL (ref 150.0–400.0)
RBC: 4.86 Mil/uL (ref 3.87–5.11)
RDW: 14.6 % (ref 11.5–14.6)
WBC: 10.8 10*3/uL — ABNORMAL HIGH (ref 4.5–10.5)

## 2012-05-08 LAB — LIPID PANEL
Cholesterol: 173 mg/dL (ref 0–200)
HDL: 60 mg/dL (ref >39.00)
LDL Cholesterol: 83 mg/dL (ref 0–99)
Total CHOL/HDL Ratio: 3
Triglycerides: 151 mg/dL — ABNORMAL HIGH (ref 0.0–149.0)
VLDL: 30.2 mg/dL (ref 0.0–40.0)

## 2012-05-08 LAB — BASIC METABOLIC PANEL
BUN: 20 mg/dL (ref 6–23)
CO2: 27 mEq/L (ref 19–32)
Calcium: 9 mg/dL (ref 8.4–10.5)
Chloride: 104 mEq/L (ref 96–112)
Creatinine, Ser: 0.7 mg/dL (ref 0.4–1.2)
GFR: 89.96 mL/min (ref >60.00)
Glucose, Bld: 91 mg/dL (ref 70–99)
Potassium: 3.7 mEq/L (ref 3.5–5.1)
Sodium: 139 mEq/L (ref 135–145)

## 2012-05-08 LAB — HEPATIC FUNCTION PANEL
ALT: 15 U/L (ref 0–35)
AST: 19 U/L (ref 0–37)
Albumin: 3.5 g/dL (ref 3.5–5.2)
Alkaline Phosphatase: 62 U/L (ref 39–117)
Bilirubin, Direct: 0 mg/dL (ref 0.0–0.3)
Total Bilirubin: 0.3 mg/dL (ref 0.3–1.2)
Total Protein: 6.9 g/dL (ref 6.0–8.3)

## 2012-05-08 NOTE — Progress Notes (Signed)
Pt took urine cup home, she could not use the bathroom.

## 2012-05-08 NOTE — Progress Notes (Signed)
Labs only

## 2012-05-09 ENCOUNTER — Encounter: Payer: Self-pay | Admitting: Family Medicine

## 2012-05-09 ENCOUNTER — Other Ambulatory Visit: Payer: Self-pay | Admitting: Family Medicine

## 2012-05-09 DIAGNOSIS — R300 Dysuria: Secondary | ICD-10-CM

## 2012-05-10 ENCOUNTER — Other Ambulatory Visit: Payer: Self-pay | Admitting: Family Medicine

## 2012-05-10 ENCOUNTER — Other Ambulatory Visit: Payer: BC Managed Care – PPO

## 2012-05-10 DIAGNOSIS — R300 Dysuria: Secondary | ICD-10-CM

## 2012-05-11 LAB — URINALYSIS, ROUTINE W REFLEX MICROSCOPIC
Bilirubin Urine: NEGATIVE
Glucose, UA: NEGATIVE mg/dL
Ketones, ur: NEGATIVE mg/dL
Leukocytes, UA: NEGATIVE
Nitrite: NEGATIVE
Protein, ur: 30 mg/dL — AB
Specific Gravity, Urine: 1.024 (ref 1.005–1.030)
Urobilinogen, UA: 0.2 mg/dL (ref 0.0–1.0)
pH: 7 (ref 5.0–8.0)

## 2012-05-11 LAB — URINALYSIS, MICROSCOPIC ONLY
Bacteria, UA: NONE SEEN
Casts: NONE SEEN

## 2012-05-13 LAB — URINE CULTURE: Colony Count: 50000

## 2012-05-14 ENCOUNTER — Encounter: Payer: Self-pay | Admitting: Family Medicine

## 2012-05-14 ENCOUNTER — Ambulatory Visit (INDEPENDENT_AMBULATORY_CARE_PROVIDER_SITE_OTHER): Payer: BC Managed Care – PPO | Admitting: Family Medicine

## 2012-05-14 VITALS — BP 126/76 | HR 79 | Temp 98.5°F | Ht 64.5 in | Wt 181.8 lb

## 2012-05-14 DIAGNOSIS — E785 Hyperlipidemia, unspecified: Secondary | ICD-10-CM

## 2012-05-14 DIAGNOSIS — F32A Depression, unspecified: Secondary | ICD-10-CM

## 2012-05-14 DIAGNOSIS — F3289 Other specified depressive episodes: Secondary | ICD-10-CM

## 2012-05-14 DIAGNOSIS — F329 Major depressive disorder, single episode, unspecified: Secondary | ICD-10-CM

## 2012-05-14 DIAGNOSIS — F411 Generalized anxiety disorder: Secondary | ICD-10-CM

## 2012-05-14 DIAGNOSIS — Z0000 Encounter for general adult medical examination without abnormal findings: Secondary | ICD-10-CM

## 2012-05-14 DIAGNOSIS — L509 Urticaria, unspecified: Secondary | ICD-10-CM

## 2012-05-14 DIAGNOSIS — R319 Hematuria, unspecified: Secondary | ICD-10-CM

## 2012-05-14 DIAGNOSIS — Z23 Encounter for immunization: Secondary | ICD-10-CM

## 2012-05-14 DIAGNOSIS — I10 Essential (primary) hypertension: Secondary | ICD-10-CM

## 2012-05-14 MED ORDER — FLUOXETINE HCL 20 MG PO TABS: 20.0000 mg | ORAL_TABLET | Freq: Every day | ORAL | Status: DC

## 2012-05-14 NOTE — Progress Notes (Signed)
  Subjective:     Lindsey [name] is a 46 y.o. female and is here for a comprehensive physical exam. The patient reports problems - several---hives no better and blood in urine--see labs and ov with Dr Birdie Riddle.  History   Social History  . Marital Status: Married    Spouse Name: N/A    Number of Children: N/A  . Years of Education: N/A   Occupational History  . Not on file.   Social History Main Topics  . Smoking status: Never Smoker   . Smokeless tobacco: Not on file  . Alcohol Use: No  . Drug Use: No  . Sexually Active: Not on file   Other Topics Concern  . Not on file   Social History Narrative   Occupation: BabysitMarriedRegular exercise- yes   Health Maintenance  Topic Date Due  . Tetanus/tdap  09/26/1985  . Influenza Vaccine  09/24/2012  . Pap Smear  12/08/2012    The following portions of the patient's history were reviewed and updated as appropriate: allergies, current medications, past family history, past medical history, past social history, past surgical history and problem list.  Review of Systems Review of Systems  Constitutional: Negative for activity change, appetite change and fatigue.  HENT: Negative for hearing loss, congestion, tinnitus and ear discharge.  dentist q44mEyes: Negative for visual disturbance (see optho q1y -- vision corrected to 20/20 with glasses).  Respiratory: Negative for cough, chest tightness and shortness of breath.   Cardiovascular: Negative for chest pain, palpitations and leg swelling.  Gastrointestinal: Negative for abdominal pain, diarrhea, constipation and abdominal distention.  Genitourinary: Negative for urgency, frequency, decreased urine volume and difficulty urinating. + blood in urine Musculoskeletal: Negative for back pain, arthralgias and gait problem.  Skin: Negative for color change, pallor-----+ urticaria Neurological: Negative for dizziness, light-headedness, numbness and headaches.  Hematological: Negative  for adenopathy. Does not bruise/bleed easily.  Psychiatric/Behavioral: Negative for suicidal ideas, confusion, sleep disturbance, self-injury, dysphoric mood, decreased concentration and agitation.       Objective:    BP 126/76  Pulse 79  Temp(Src) 98.5 F (36.9 C) (Oral)  Ht 5' 4.5" (1.638 m)  Wt 181 lb 12.8 oz (82.464 kg)  BMI 30.72 kg/m2  SpO2 97% General appearance: alert, cooperative, appears stated age and no distress Head: Normocephalic, without obvious abnormality, atraumatic Eyes: conjunctivae/corneas clear. PERRL, EOM's intact. Fundi benign. Ears: normal TM's and external ear canals both ears Nose: Nares normal. Septum midline. Mucosa normal. No drainage or sinus tenderness. Throat: lips, mucosa, and tongue normal; teeth and gums normal Neck: no adenopathy, no carotid bruit, no JVD, supple, symmetrical, trachea midline and thyroid not enlarged, symmetric, no tenderness/mass/nodules Back: symmetric, no curvature. ROM normal. No CVA tenderness. Lungs: clear to auscultation bilaterally Breasts: gyn Heart: regular rate and rhythm, S1, S2 normal, no murmur, click, rub or gallop Abdomen: soft, non-tender; bowel sounds normal; no masses,  no organomegaly Pelvic: gyn Extremities: extremities normal, atraumatic, no cyanosis or edema Pulses: 2+ and symmetric Skin: Skin color, texture, turgor normal. No rashes or lesions Lymph nodes: Cervical, supraclavicular, and axillary nodes normal. Neurologic: Alert and oriented X 3, normal strength and tone. Normal symmetric reflexes. Normal coordination and gait depression---pt increased prozac to 20 mg on her own and she is doing well    Assessment:    Healthy female exam.      Plan:  ghm utd Labs reviewed   See After Visit Summary for Counseling Recommendations

## 2012-05-14 NOTE — Assessment & Plan Note (Signed)
con't meds See labs --reviewed

## 2012-05-14 NOTE — Assessment & Plan Note (Signed)
con't with weight loss  And exercise Stable con't meds

## 2012-05-14 NOTE — Assessment & Plan Note (Signed)
con't prozac 20 mg  rto prn

## 2012-05-14 NOTE — Assessment & Plan Note (Signed)
con't to come and go con't antihistamine and refer to allergist

## 2012-05-14 NOTE — Patient Instructions (Signed)
Preventive Care for Adults, Female A healthy lifestyle and preventive care can promote health and wellness. Preventive health guidelines for women include the following key practices.  A routine yearly physical is a good way to check with your caregiver about your health and preventive screening. It is a chance to share any concerns and updates on your health, and to receive a thorough exam.   Visit your dentist for a routine exam and preventive care every 6 months. Brush your teeth twice a day and floss once a day. Good oral hygiene prevents tooth decay and gum disease.   The frequency of eye exams is based on your age, health, family medical history, use of contact lenses, and other factors. Follow your caregiver's recommendations for frequency of eye exams.   Eat a healthy diet. Foods like vegetables, fruits, whole grains, low-fat dairy products, and lean protein foods contain the nutrients you need without too many calories. Decrease your intake of foods high in solid fats, added sugars, and salt. Eat the right amount of calories for you.Get information about a proper diet from your caregiver, if necessary.   Regular physical exercise is one of the most important things you can do for your health. Most adults should get at least 150 minutes of moderate-intensity exercise (any activity that increases your heart rate and causes you to sweat) each week. In addition, most adults need muscle-strengthening exercises on 2 or more days a week.   Maintain a healthy weight. The body mass index (BMI) is a screening tool to identify possible weight problems. It provides an estimate of body fat based on height and weight. Your caregiver can help determine your BMI, and can help you achieve or maintain a healthy weight.For adults 20 years and older:   A BMI below 18.5 is considered underweight.   A BMI of 18.5 to 24.9 is normal.   A BMI of 25 to 29.9 is considered overweight.   A BMI of 30 and above is  considered obese.   Maintain normal blood lipids and cholesterol levels by exercising and minimizing your intake of saturated fat. Eat a balanced diet with plenty of fruit and vegetables. Blood tests for lipids and cholesterol should begin at age 32 and be repeated every 5 years. If your lipid or cholesterol levels are high, you are over 50, or you are at high risk for heart disease, you may need your cholesterol levels checked more frequently.Ongoing high lipid and cholesterol levels should be treated with medicines if diet and exercise are not effective.   If you smoke, find out from your caregiver how to quit. If you do not use tobacco, do not start.   If you are pregnant, do not drink alcohol. If you are breastfeeding, be very cautious about drinking alcohol. If you are not pregnant and choose to drink alcohol, do not exceed 1 drink per day. One drink is considered to be 12 ounces (355 mL) of beer, 5 ounces (148 mL) of wine, or 1.5 ounces (44 mL) of liquor.   Avoid use of street drugs. Do not share needles with anyone. Ask for help if you need support or instructions about stopping the use of drugs.   High blood pressure causes heart disease and increases the risk of stroke. Your blood pressure should be checked at least every 1 to 2 years. Ongoing high blood pressure should be treated with medicines if weight loss and exercise are not effective.   If you are 55 to 46  years old, ask your caregiver if you should take aspirin to prevent strokes.   Diabetes screening involves taking a blood sample to check your fasting blood sugar level. This should be done once every 3 years, after age 91, if you are within normal weight and without risk factors for diabetes. Testing should be considered at a younger age or be carried out more frequently if you are overweight and have at least 1 risk factor for diabetes.   Breast cancer screening is essential preventive care for women. You should practice "breast  self-awareness." This means understanding the normal appearance and feel of your breasts and may include breast self-examination. Any changes detected, no matter how small, should be reported to a caregiver. Women in their 39s and 30s should have a clinical breast exam (CBE) by a caregiver as part of a regular health exam every 1 to 3 years. After age 73, women should have a CBE every year. Starting at age 65, women should consider having a mammography (breast X-ray test) every year. Women who have a family history of breast cancer should talk to their caregiver about genetic screening. Women at a high risk of breast cancer should talk to their caregivers about having magnetic resonance imaging (MRI) and a mammography every year.   The Pap test is a screening test for cervical cancer. A Pap test can show cell changes on the cervix that might become cervical cancer if left untreated. A Pap test is a procedure in which cells are obtained and examined from the lower end of the uterus (cervix).   Women should have a Pap test starting at age 37.   Between ages 1 and 20, Pap tests should be repeated every 2 years.   Beginning at age 40, you should have a Pap test every 3 years as long as the past 3 Pap tests have been normal.   Some women have medical problems that increase the chance of getting cervical cancer. Talk to your caregiver about these problems. It is especially important to talk to your caregiver if a new problem develops soon after your last Pap test. In these cases, your caregiver may recommend more frequent screening and Pap tests.   The above recommendations are the same for women who have or have not gotten the vaccine for human papillomavirus (HPV).   If you had a hysterectomy for a problem that was not cancer or a condition that could lead to cancer, then you no longer need Pap tests. Even if you no longer need a Pap test, a regular exam is a good idea to make sure no other problems are  starting.   If you are between ages 46 and 56, and you have had normal Pap tests going back 10 years, you no longer need Pap tests. Even if you no longer need a Pap test, a regular exam is a good idea to make sure no other problems are starting.   If you have had past treatment for cervical cancer or a condition that could lead to cancer, you need Pap tests and screening for cancer for at least 20 years after your treatment.   If Pap tests have been discontinued, risk factors (such as a new sexual partner) need to be reassessed to determine if screening should be resumed.   The HPV test is an additional test that may be used for cervical cancer screening. The HPV test looks for the virus that can cause the cell changes on the cervix.  The cells collected during the Pap test can be tested for HPV. The HPV test could be used to screen women aged 64 years and older, and should be used in women of any age who have unclear Pap test results. After the age of 36, women should have HPV testing at the same frequency as a Pap test.   Colorectal cancer can be detected and often prevented. Most routine colorectal cancer screening begins at the age of 51 and continues through age 3. However, your caregiver may recommend screening at an earlier age if you have risk factors for colon cancer. On a yearly basis, your caregiver may provide home test kits to check for hidden blood in the stool. Use of a small camera at the end of a tube, to directly examine the colon (sigmoidoscopy or colonoscopy), can detect the earliest forms of colorectal cancer. Talk to your caregiver about this at age 58, when routine screening begins. Direct examination of the colon should be repeated every 5 to 10 years through age 62, unless early forms of pre-cancerous polyps or small growths are found.   Hepatitis C blood testing is recommended for all people born from [birth year] through 1965 and any individual with known risks for hepatitis C.    Practice safe sex. Use condoms and avoid high-risk sexual practices to reduce the spread of sexually transmitted infections (STIs). STIs include gonorrhea, chlamydia, syphilis, trichomonas, herpes, HPV, and human immunodeficiency virus (HIV). Herpes, HIV, and HPV are viral illnesses that have no cure. They can result in disability, cancer, and death. Sexually active women aged 50 and younger should be checked for chlamydia. Older women with new or multiple partners should also be tested for chlamydia. Testing for other STIs is recommended if you are sexually active and at increased risk.   Osteoporosis is a disease in which the bones lose minerals and strength with aging. This can result in serious bone fractures. The risk of osteoporosis can be identified using a bone density scan. Women ages 80 and over and women at risk for fractures or osteoporosis should discuss screening with their caregivers. Ask your caregiver whether you should take a calcium supplement or vitamin D to reduce the rate of osteoporosis.   Menopause can be associated with physical symptoms and risks. Hormone replacement therapy is available to decrease symptoms and risks. You should talk to your caregiver about whether hormone replacement therapy is right for you.   Use sunscreen with sun protection factor (SPF) of 30 or more. Apply sunscreen liberally and repeatedly throughout the day. You should seek shade when your shadow is shorter than you. Protect yourself by wearing long sleeves, pants, a wide-brimmed hat, and sunglasses year round, whenever you are outdoors.   Once a month, do a whole body skin exam, using a mirror to look at the skin on your back. Notify your caregiver of new moles, moles that have irregular borders, moles that are larger than a pencil eraser, or moles that have changed in shape or color.   Stay current with required immunizations.   Influenza. You need a dose every fall (or winter). The composition of  the flu vaccine changes each year, so being vaccinated once is not enough.   Pneumococcal polysaccharide. You need 1 to 2 doses if you smoke cigarettes or if you have certain chronic medical conditions. You need 1 dose at age 39 (or older) if you have never been vaccinated.   Tetanus, diphtheria, pertussis (Tdap, Td). Get 1 dose of  Tdap vaccine if you are younger than age 73, are over 87 and have contact with an infant, are a Dietitian, are pregnant, or simply want to be protected from whooping cough. After that, you need a Td booster dose every 10 years. Consult your caregiver if you have not had at least 3 tetanus and diphtheria-containing shots sometime in your life or have a deep or dirty wound.   HPV. You need this vaccine if you are a woman age 14 or younger. The vaccine is given in 3 doses over 6 months.   Measles, mumps, rubella (MMR). You need at least 1 dose of MMR if you were born in 1957 or later. You may also need a second dose.   Meningococcal. If you are age 30 to 71 and a first-year college student living in a residence hall, or have one of several medical conditions, you need to get vaccinated against meningococcal disease. You may also need additional booster doses.   Zoster (shingles). If you are age 58 or older, you should get this vaccine.   Varicella (chickenpox). If you have never had chickenpox or you were vaccinated but received only 1 dose, talk to your caregiver to find out if you need this vaccine.   Hepatitis A. You need this vaccine if you have a specific risk factor for hepatitis A virus infection or you simply wish to be protected from this disease. The vaccine is usually given as 2 doses, 6 to 18 months apart.   Hepatitis B. You need this vaccine if you have a specific risk factor for hepatitis B virus infection or you simply wish to be protected from this disease. The vaccine is given in 3 doses, usually over 6 months.  Preventive Services /  Frequency Ages 40 to 70  Blood pressure check.** / Every 1 to 2 years.   Lipid and cholesterol check.** / Every 5 years beginning at age 48.   Clinical breast exam.** / Every 3 years for women in their 60s and 48s.   Pap test.** / Every 2 years from ages 51 through 24. Every 3 years starting at age 39 through age 33 or 8 with a history of 3 consecutive normal Pap tests.   HPV screening.** / Every 3 years from ages 40 through ages 59 to 81 with a history of 3 consecutive normal Pap tests.   Hepatitis C blood test.** / For any individual with known risks for hepatitis C.   Skin self-exam. / Monthly.   Influenza immunization.** / Every year.   Pneumococcal polysaccharide immunization.** / 1 to 2 doses if you smoke cigarettes or if you have certain chronic medical conditions.   Tetanus, diphtheria, pertussis (Tdap, Td) immunization. / A one-time dose of Tdap vaccine. After that, you need a Td booster dose every 10 years.   HPV immunization. / 3 doses over 6 months, if you are 79 and younger.   Measles, mumps, rubella (MMR) immunization. / You need at least 1 dose of MMR if you were born in 1957 or later. You may also need a second dose.   Meningococcal immunization. / 1 dose if you are age 32 to 31 and a first-year college student living in a residence hall, or have one of several medical conditions, you need to get vaccinated against meningococcal disease. You may also need additional booster doses.   Varicella immunization.** / Consult your caregiver.   Hepatitis A immunization.** / Consult your caregiver. 2 doses, 6 to 18 months  apart.   Hepatitis B immunization.** / Consult your caregiver. 3 doses usually over 6 months.  Ages 20 to 65  Blood pressure check.** / Every 1 to 2 years.   Lipid and cholesterol check.** / Every 5 years beginning at age 50.   Clinical breast exam.** / Every year after age 22.   Mammogram.** / Every year beginning at age 56 and continuing for as  long as you are in good health. Consult with your caregiver.   Pap test.** / Every 3 years starting at age 72 through age 27 or 63 with a history of 3 consecutive normal Pap tests.   HPV screening.** / Every 3 years from ages 59 through ages 31 to 64 with a history of 3 consecutive normal Pap tests.   Fecal occult blood test (FOBT) of stool. / Every year beginning at age 39 and continuing until age 68. You may not need to do this test if you get a colonoscopy every 10 years.   Flexible sigmoidoscopy or colonoscopy.** / Every 5 years for a flexible sigmoidoscopy or every 10 years for a colonoscopy beginning at age 41 and continuing until age 87.   Hepatitis C blood test.** / For all people born from [birth year] through 1965 and any individual with known risks for hepatitis C.   Skin self-exam. / Monthly.   Influenza immunization.** / Every year.   Pneumococcal polysaccharide immunization.** / 1 to 2 doses if you smoke cigarettes or if you have certain chronic medical conditions.   Tetanus, diphtheria, pertussis (Tdap, Td) immunization.** / A one-time dose of Tdap vaccine. After that, you need a Td booster dose every 10 years.   Measles, mumps, rubella (MMR) immunization. / You need at least 1 dose of MMR if you were born in 1957 or later. You may also need a second dose.   Varicella immunization.** / Consult your caregiver.   Meningococcal immunization.** / Consult your caregiver.   Hepatitis A immunization.** / Consult your caregiver. 2 doses, 6 to 18 months apart.   Hepatitis B immunization.** / Consult your caregiver. 3 doses, usually over 6 months.  Ages 45 and over  Blood pressure check.** / Every 1 to 2 years.   Lipid and cholesterol check.** / Every 5 years beginning at age 43.   Clinical breast exam.** / Every year after age 20.   Mammogram.** / Every year beginning at age 57 and continuing for as long as you are in good health. Consult with your caregiver.   Pap test.** /  Every 3 years starting at age 66 through age 23 or 39 with a 3 consecutive normal Pap tests. Testing can be stopped between 65 and 70 with 3 consecutive normal Pap tests and no abnormal Pap or HPV tests in the past 10 years.   HPV screening.** / Every 3 years from ages 43 through ages 41 or 69 with a history of 3 consecutive normal Pap tests. Testing can be stopped between 65 and 70 with 3 consecutive normal Pap tests and no abnormal Pap or HPV tests in the past 10 years.   Fecal occult blood test (FOBT) of stool. / Every year beginning at age 54 and continuing until age 18. You may not need to do this test if you get a colonoscopy every 10 years.   Flexible sigmoidoscopy or colonoscopy.** / Every 5 years for a flexible sigmoidoscopy or every 10 years for a colonoscopy beginning at age 58 and continuing until age 31.   Hepatitis  C blood test.** / For all people born from [birth year] through 1965 and any individual with known risks for hepatitis C.   Osteoporosis screening.** / A one-time screening for women ages 50 and over and women at risk for fractures or osteoporosis.   Skin self-exam. / Monthly.   Influenza immunization.** / Every year.   Pneumococcal polysaccharide immunization.** / 1 dose at age 47 (or older) if you have never been vaccinated.   Tetanus, diphtheria, pertussis (Tdap, Td) immunization. / A one-time dose of Tdap vaccine if you are over 65 and have contact with an infant, are a Dietitian, or simply want to be protected from whooping cough. After that, you need a Td booster dose every 10 years.   Varicella immunization.** / Consult your caregiver.   Meningococcal immunization.** / Consult your caregiver.   Hepatitis A immunization.** / Consult your caregiver. 2 doses, 6 to 18 months apart.   Hepatitis B immunization.** / Check with your caregiver. 3 doses, usually over 6 months.  ** Family history and personal history of risk and conditions may change your caregiver's  recommendations. Document Released: 02/06/2002 Document Revised: 11/30/2011 Document Reviewed: 05/08/2011 [facility] Patient Information 2012 [place], [place].

## 2012-05-15 ENCOUNTER — Other Ambulatory Visit: Payer: BC Managed Care – PPO

## 2012-05-24 ENCOUNTER — Ambulatory Visit (INDEPENDENT_AMBULATORY_CARE_PROVIDER_SITE_OTHER)
Admission: RE | Admit: 2012-05-24 | Discharge: 2012-05-24 | Disposition: A | Discharge status: Home / Self Care | Payer: BC Managed Care – PPO | Source: Ambulatory Visit | Attending: Family Medicine | Admitting: Family Medicine

## 2012-05-24 DIAGNOSIS — R3129 Other microscopic hematuria: Secondary | ICD-10-CM

## 2012-05-24 DIAGNOSIS — R300 Dysuria: Secondary | ICD-10-CM

## 2012-05-26 ENCOUNTER — Encounter: Payer: Self-pay | Admitting: Family Medicine

## 2012-05-27 ENCOUNTER — Encounter: Payer: Self-pay | Admitting: Family Medicine

## 2012-05-31 ENCOUNTER — Telehealth: Payer: Self-pay | Admitting: *Deleted

## 2012-05-31 NOTE — Telephone Encounter (Signed)
Received call from [facility] with [facility] advising that the pt called and set up an apt with them and she needs to have all recent labs and CT scan to be faxed over, faxed over all information requested, advised referral coordinator information and apt have been set up per pt

## 2012-06-03 ENCOUNTER — Ambulatory Visit: Payer: BC Managed Care – PPO

## 2012-06-06 ENCOUNTER — Ambulatory Visit: Payer: BC Managed Care – PPO

## 2012-06-07 ENCOUNTER — Other Ambulatory Visit: Payer: Self-pay | Admitting: Family Medicine

## 2012-06-10 ENCOUNTER — Ambulatory Visit (INDEPENDENT_AMBULATORY_CARE_PROVIDER_SITE_OTHER): Payer: BC Managed Care – PPO

## 2012-06-10 DIAGNOSIS — Z111 Encounter for screening for respiratory tuberculosis: Secondary | ICD-10-CM

## 2012-06-12 LAB — TB SKIN TEST
Induration: 0 mm
TB Skin Test: NEGATIVE

## 2012-06-19 ENCOUNTER — Other Ambulatory Visit: Payer: Self-pay | Admitting: Family Medicine

## 2012-08-07 ENCOUNTER — Encounter: Payer: Self-pay | Admitting: Family Medicine

## 2012-08-07 ENCOUNTER — Telehealth: Payer: Self-pay | Admitting: *Deleted

## 2012-08-07 NOTE — Telephone Encounter (Signed)
Noted incoming My Chart message as follows:  [name], I need to get something from your office showing that I had a physical this spring. How do I go about getting this? [name]   Letter has been printed, sent pt email via my chart to ask if pt wants to come to office to pick up letter, email the letter for printing, fax or mail to home address, will await pt response and take next steps directed.

## 2012-08-07 NOTE — Telephone Encounter (Signed)
Letter has been printed, sent pt email via my chart to ask if pt wants to come to office to pick up letter, email the letter for printing, fax or mail to home address, will await pt response and take next steps directed.

## 2012-08-08 NOTE — Telephone Encounter (Signed)
Noted pt came into office today and picked up signed letter

## 2012-08-13 DIAGNOSIS — Z0279 Encounter for issue of other medical certificate: Secondary | ICD-10-CM

## 2012-08-21 ENCOUNTER — Other Ambulatory Visit: Payer: Self-pay | Admitting: Family Medicine

## 2012-08-21 MED ORDER — EZETIMIBE 10 MG PO TABS: 10.0000 mg | ORAL_TABLET | Freq: Every day | ORAL | Status: DC

## 2012-08-21 NOTE — Telephone Encounter (Signed)
Refill ZETIA 10 MG TAKE ONE (1) TABLET EACH DAY # 30, last fill 7.30.13, last ov V70 5.21.13

## 2012-11-07 ENCOUNTER — Telehealth: Payer: Self-pay | Admitting: Family Medicine

## 2012-11-07 DIAGNOSIS — E785 Hyperlipidemia, unspecified: Secondary | ICD-10-CM

## 2012-11-07 NOTE — Telephone Encounter (Signed)
Discussed with patient orders for labs are in the system and the patient will bring in the thyroid order when she comes in next week.    KP

## 2012-11-07 NOTE — Telephone Encounter (Signed)
pt called stated she is past due for labs. I do not see that, also pt wants to know if you can add a thyroid to it for her allergy dr. advised she would need tobring order from that dr. she said forget it but she does need her labs for here. [phone] [phone]

## 2012-11-14 ENCOUNTER — Other Ambulatory Visit (INDEPENDENT_AMBULATORY_CARE_PROVIDER_SITE_OTHER): Payer: BC Managed Care – PPO

## 2012-11-14 DIAGNOSIS — E785 Hyperlipidemia, unspecified: Secondary | ICD-10-CM

## 2012-11-14 LAB — LIPID PANEL
Cholesterol: 184 mg/dL (ref 0–200)
HDL: 56.5 mg/dL (ref >39.00)
LDL Cholesterol: 103 mg/dL — ABNORMAL HIGH (ref 0–99)
Total CHOL/HDL Ratio: 3
Triglycerides: 125 mg/dL (ref 0.0–149.0)
VLDL: 25 mg/dL (ref 0.0–40.0)

## 2012-11-14 LAB — HEPATIC FUNCTION PANEL
ALT: 11 U/L (ref 0–35)
AST: 16 U/L (ref 0–37)
Albumin: 3.4 g/dL — ABNORMAL LOW (ref 3.5–5.2)
Alkaline Phosphatase: 79 U/L (ref 39–117)
Bilirubin, Direct: 0 mg/dL (ref 0.0–0.3)
Total Bilirubin: 0.3 mg/dL (ref 0.3–1.2)
Total Protein: 7.4 g/dL (ref 6.0–8.3)

## 2012-11-27 ENCOUNTER — Telehealth: Payer: Self-pay | Admitting: Family Medicine

## 2012-11-27 MED ORDER — EZETIMIBE 10 MG PO TABS: 10.0000 mg | ORAL_TABLET | Freq: Every day | ORAL | Status: DC

## 2012-11-27 NOTE — Telephone Encounter (Signed)
Refill: Zetia 10 mg tab. Take one tablet each day. Qty 30. Last fill 10-21-12

## 2012-12-09 ENCOUNTER — Other Ambulatory Visit: Payer: Self-pay | Admitting: Family Medicine

## 2013-02-08 ENCOUNTER — Other Ambulatory Visit: Payer: Self-pay

## 2013-04-08 ENCOUNTER — Other Ambulatory Visit: Payer: Self-pay | Admitting: Family Medicine

## 2013-04-16 ENCOUNTER — Telehealth: Payer: Self-pay | Admitting: Family Medicine

## 2013-04-16 MED ORDER — OMEGA-3-ACID ETHYL ESTERS 1 G PO CAPS: ORAL_CAPSULE | ORAL | Status: DC

## 2013-04-16 NOTE — Telephone Encounter (Signed)
Refill: Lovaza 1 g capsule. Take 2 capsules by mouth every morning and evening. Qty 120. Last fill 04-12-13

## 2013-05-07 ENCOUNTER — Other Ambulatory Visit: Payer: Self-pay | Admitting: Obstetrics & Gynecology

## 2013-05-07 DIAGNOSIS — R928 Other abnormal and inconclusive findings on diagnostic imaging of breast: Secondary | ICD-10-CM

## 2013-05-21 ENCOUNTER — Other Ambulatory Visit: Payer: Self-pay | Admitting: General Practice

## 2013-05-21 DIAGNOSIS — F32A Depression, unspecified: Secondary | ICD-10-CM

## 2013-05-21 DIAGNOSIS — F329 Major depressive disorder, single episode, unspecified: Secondary | ICD-10-CM

## 2013-05-21 MED ORDER — FLUOXETINE HCL 20 MG PO TABS: 20.0000 mg | ORAL_TABLET | Freq: Every day | ORAL | Status: DC

## 2013-05-22 ENCOUNTER — Ambulatory Visit
Admission: RE | Admit: 2013-05-22 | Discharge: 2013-05-22 | Disposition: A | Discharge status: Home / Self Care | Payer: BC Managed Care – PPO | Source: Ambulatory Visit | Attending: Obstetrics & Gynecology | Admitting: Obstetrics & Gynecology

## 2013-05-22 DIAGNOSIS — R928 Other abnormal and inconclusive findings on diagnostic imaging of breast: Secondary | ICD-10-CM

## 2013-05-25 LAB — HM PAP SMEAR

## 2013-06-05 ENCOUNTER — Other Ambulatory Visit: Payer: Self-pay | Admitting: General Practice

## 2013-06-05 ENCOUNTER — Emergency Department (HOSPITAL_BASED_OUTPATIENT_CLINIC_OR_DEPARTMENT_OTHER)
Admission: EM | Admit: 2013-06-05 | Discharge: 2013-06-05 | Disposition: A | Discharge status: Home / Self Care | Payer: BC Managed Care – PPO | Attending: Emergency Medicine | Admitting: Emergency Medicine

## 2013-06-05 ENCOUNTER — Encounter (HOSPITAL_BASED_OUTPATIENT_CLINIC_OR_DEPARTMENT_OTHER): Payer: Self-pay | Admitting: *Deleted

## 2013-06-05 DIAGNOSIS — Z79899 Other long term (current) drug therapy: Secondary | ICD-10-CM | POA: Insufficient documentation

## 2013-06-05 DIAGNOSIS — I10 Essential (primary) hypertension: Secondary | ICD-10-CM | POA: Insufficient documentation

## 2013-06-05 DIAGNOSIS — R112 Nausea with vomiting, unspecified: Secondary | ICD-10-CM | POA: Insufficient documentation

## 2013-06-05 DIAGNOSIS — I951 Orthostatic hypotension: Secondary | ICD-10-CM | POA: Insufficient documentation

## 2013-06-05 DIAGNOSIS — E785 Hyperlipidemia, unspecified: Secondary | ICD-10-CM | POA: Insufficient documentation

## 2013-06-05 LAB — CBC WITH DIFFERENTIAL/PLATELET
Basophils Absolute: 0 10*3/uL (ref 0.0–0.1)
Basophils Relative: 0 % (ref 0–1)
Eosinophils Absolute: 0 10*3/uL (ref 0.0–0.7)
Eosinophils Relative: 0 % (ref 0–5)
HCT: 38.6 % (ref 36.0–46.0)
Hemoglobin: 12.8 g/dL (ref 12.0–15.0)
Lymphocytes Relative: 9 % — ABNORMAL LOW (ref 12–46)
Lymphs Abs: 1.2 10*3/uL (ref 0.7–4.0)
MCH: 27.5 pg (ref 26.0–34.0)
MCHC: 33.2 g/dL (ref 30.0–36.0)
MCV: 83 fL (ref 78.0–100.0)
Monocytes Absolute: 0.4 10*3/uL (ref 0.1–1.0)
Monocytes Relative: 3 % (ref 3–12)
Neutro Abs: 11.4 10*3/uL — ABNORMAL HIGH (ref 1.7–7.7)
Neutrophils Relative %: 87 % — ABNORMAL HIGH (ref 43–77)
Platelets: 267 10*3/uL (ref 150–400)
RBC: 4.65 MIL/uL (ref 3.87–5.11)
RDW: 13.9 % (ref 11.5–15.5)
WBC: 13 10*3/uL — ABNORMAL HIGH (ref 4.0–10.5)

## 2013-06-05 LAB — BASIC METABOLIC PANEL
BUN: 18 mg/dL (ref 6–23)
CO2: 24 mEq/L (ref 19–32)
Calcium: 8.9 mg/dL (ref 8.4–10.5)
Chloride: 102 mEq/L (ref 96–112)
Creatinine, Ser: 0.6 mg/dL (ref 0.50–1.10)
GFR calc Af Amer: >90 mL/min (ref >90)
GFR calc non Af Amer: >90 mL/min (ref >90)
Glucose, Bld: 139 mg/dL — ABNORMAL HIGH (ref 70–99)
Potassium: 4.1 mEq/L (ref 3.5–5.1)
Sodium: 138 mEq/L (ref 135–145)

## 2013-06-05 MED ORDER — SODIUM CHLORIDE 0.9 % IV BOLUS (SEPSIS)
1000.0000 mL | Freq: Once | INTRAVENOUS | Status: AC
  Administered 2013-06-05: 1000 mL via INTRAVENOUS

## 2013-06-05 MED ORDER — KETOROLAC TROMETHAMINE 30 MG/ML IJ SOLN: 30.0000 mg | Freq: Once | INTRAMUSCULAR | Status: AC

## 2013-06-05 MED ORDER — ONDANSETRON HCL 4 MG/2ML IJ SOLN
4.0000 mg | Freq: Once | INTRAMUSCULAR | Status: AC
  Administered 2013-06-05: 4 mg via INTRAVENOUS
  Filled 2013-06-05: qty 2

## 2013-06-05 MED ORDER — ONDANSETRON 8 MG PO TBDP: 8.0000 mg | ORAL_TABLET | Freq: Three times a day (TID) | ORAL | Status: DC | PRN

## 2013-06-05 MED ORDER — VALSARTAN 320 MG PO TABS: ORAL_TABLET | ORAL | Status: DC

## 2013-06-05 MED ORDER — EZETIMIBE 10 MG PO TABS: 10.0000 mg | ORAL_TABLET | Freq: Every day | ORAL | Status: DC

## 2013-06-05 MED ORDER — KETOROLAC TROMETHAMINE 30 MG/ML IJ SOLN
INTRAMUSCULAR | Status: AC
  Administered 2013-06-05: 30 mg via INTRAVENOUS
  Filled 2013-06-05: qty 1

## 2013-06-05 NOTE — ED Provider Notes (Signed)
History     CSN: [id number]  Arrival date & time 06/05/13  9242   First MD Initiated Contact with Patient 06/05/13 1653      Chief Complaint  Patient presents with  . Abdominal Pain    (Consider location/radiation/quality/duration/timing/severity/associated sxs/prior treatment) HPI  47 year old female who had ablation of her wrist today percent complaining of nausea, vomiting, and lightheadedness.  The GYN was Dr. Nori Riis. I spoke with Dr. and Milta Deiters via telephone. He feels that she is likely volume depleted. The patient is on multiple antihypertensives. She was given pain medicine at the time of the procedure an attempt was taken oxycodone which caused her to vomit. She states that normally she can take some oxycodone but is usually given Zofran with it. She denies any head pain, chest pain, dyspnea, or therapy. She has had some mild vaginal bleeding. She states she has some cramping in the lower abdomen which is consistent with what she was told to expect after the procedure.  Past Medical History  Diagnosis Date  . Hyperlipidemia   . Hypertension     Past Surgical History  Procedure Laterality Date  . Leep    . Lithotripsy      Family History  Problem Relation Age of Onset  . Dementia Father   . Alzheimer's disease Father   . Hyperlipidemia    . Hypertension    . Diabetes      History  Substance Use Topics  . Smoking status: Never Smoker   . Smokeless tobacco: Not on file  . Alcohol Use: No    OB History   Grav Para Term Preterm Abortions TAB SAB Ect Mult Living                  Review of Systems  All other systems reviewed and are negative.    Allergies  Macrobid  Home Medications   Current Outpatient Rx  Name  Route  Sig  Dispense  Refill  . ALPRAZolam (XANAX) 0.25 MG tablet   Oral   Take 1 tablet (0.25 mg total) by mouth 2 (two) times daily as needed for sleep.   30 tablet   0   . amLODipine (NORVASC) 5 MG tablet      TAKE 1 TABLET EVERY  EVENING   120 tablet   1   . cetirizine (ZYRTEC) 10 MG tablet   Oral   Take 10 mg by mouth daily.           . Cyanocobalamin (VITAMIN B 12 PO)   Oral   Take by mouth.           . doxycycline (ADOXA) 100 MG tablet   Oral   Take 100 mg by mouth 2 (two) times daily.           Marland Kitchen ezetimibe (ZETIA) 10 MG tablet   Oral   Take 1 tablet (10 mg total) by mouth daily.   30 tablet   2   . FLUoxetine (PROZAC) 20 MG tablet   Oral   Take 1 tablet (20 mg total) by mouth daily.   30 tablet   11   . methylPREDNISolone (MEDROL DOSEPAK) 4 MG tablet   Oral   Take by mouth. follow package directions          . norethindrone-ethinyl estradiol (JUNEL FE,GILDESS FE,LOESTRIN FE) 1-20 MG-MCG tablet   Oral   Take 1 tablet by mouth daily.           Marland Kitchen omega-3  acid ethyl esters (LOVAZA) 1 G capsule      TAKE 2 CASPULES BY MOUTH EVERY MORNING  AND EVENING   120 capsule   0   . EXPIRED: simvastatin (ZOCOR) 20 MG tablet   Oral   Take 1 tablet (20 mg total) by mouth every evening.   30 tablet   0     Repeat Labs are over due unable to do 120 day supp ...   . spironolactone (ALDACTONE) 25 MG tablet      TAKE ONE (1) TABLET EACH DAY   120 tablet   1   . valsartan (DIOVAN) 320 MG tablet      1 tab by mouth daily--office visit due now   30 tablet   0     BP 95/46  Pulse 56  Temp(Src) 97.7 F (36.5 C) (Oral)  Resp 20  Ht 5' 4"  (1.626 m)  Wt 190 lb (86.183 kg)  BMI 32.6 kg/m2  SpO2 100%  Physical Exam  Nursing note and vitals reviewed. Constitutional: She is oriented to person, place, and time. She appears well-developed and well-nourished.  HENT:  Head: Normocephalic and atraumatic.  Right Ear: External ear normal.  Left Ear: External ear normal.  Nose: Nose normal.  Mouth/Throat: Oropharynx is clear and moist.  Eyes: Conjunctivae and EOM are normal. Pupils are equal, round, and reactive to light.  Neck: Normal range of motion. Neck supple.  Cardiovascular:  Regular rhythm, normal heart sounds and intact distal pulses.  Bradycardia present.   Pulmonary/Chest: Effort normal and breath sounds normal.  Abdominal: Soft. Bowel sounds are normal.  Musculoskeletal: Normal range of motion.  Neurological: She is alert and oriented to person, place, and time. She has normal reflexes.  Skin: Skin is warm and dry.  Psychiatric: She has a normal mood and affect. Her behavior is normal. Thought content normal.    ED Course  Procedures (including critical care time)  Labs Reviewed  CBC WITH DIFFERENTIAL - Abnormal; Notable for the following:    WBC 13.0 (*)    Neutrophils Relative % 87 (*)    Neutro Abs 11.4 (*)    Lymphocytes Relative 9 (*)    All other components within normal limits  BASIC METABOLIC PANEL - Abnormal; Notable for the following:    Glucose, Bld 139 (*)    All other components within normal limits   No results found.   No diagnosis found.    MDM  47 year old female with vomiting after ablation therapy today. Hemoglobin is 11.4 and patient was initially somewhat bradycardic. She is on multiple antihypertensives. She is given IV fluid here and feels greatly improved. She is also given anti-emetics and is now keeping down fluids. She's given 2 L of normal saline and is discharged home in improved condition. She is to followup with Dr. Nori Riis.        Shaune Pollack, MD 06/05/13 [phone]

## 2013-06-05 NOTE — ED Notes (Signed)
MD at bedside. 

## 2013-06-05 NOTE — ED Notes (Signed)
Patient had an endometrial ablation today and is having continuous N/V and abd pain despite prescription medications. Called the dr office and was told to come to the ER

## 2013-06-05 NOTE — ED Notes (Signed)
MD at bedside giving test results and dispo plan of care.

## 2013-06-05 NOTE — Telephone Encounter (Signed)
Med filled. Pt due for an OV.

## 2013-06-19 ENCOUNTER — Ambulatory Visit (INDEPENDENT_AMBULATORY_CARE_PROVIDER_SITE_OTHER): Payer: BC Managed Care – PPO | Admitting: Family Medicine

## 2013-06-19 ENCOUNTER — Encounter: Payer: Self-pay | Admitting: Family Medicine

## 2013-06-19 VITALS — BP 132/80 | HR 68 | Temp 98.4°F | Wt 194.2 lb

## 2013-06-19 DIAGNOSIS — I10 Essential (primary) hypertension: Secondary | ICD-10-CM

## 2013-06-19 DIAGNOSIS — R82994 Hypercalciuria: Secondary | ICD-10-CM

## 2013-06-19 MED ORDER — INDAPAMIDE 2.5 MG PO TABS: 2.5000 mg | ORAL_TABLET | ORAL | Status: DC

## 2013-06-19 NOTE — Assessment & Plan Note (Signed)
Per urology Change spironolactone to indapamide

## 2013-06-19 NOTE — Patient Instructions (Signed)

## 2013-06-19 NOTE — Progress Notes (Signed)
  Subjective:    Patient here for follow-up of elevated blood pressure.  She is exercising and is adherent to a low-salt diet.  Blood pressure is well controlled at home. Cardiac symptoms: none. Patient denies: chest pain, chest pressure/discomfort, claudication, dyspnea, exertional chest pressure/discomfort, fatigue, irregular heart beat, lower extremity edema, near-syncope, orthopnea, palpitations, paroxysmal nocturnal dyspnea, syncope and tachypnea. Cardiovascular risk factors: dyslipidemia, hypertension and obesity (BMI >= 30 kg/m2). Use of agents associated with hypertension: none. History of target organ damage: none. Pt has been struggling with kidney stones.  She sees DR Nevada Crane at [facility].  She had lithotrypsy.  Calcium in urine was high--- he would like her to be on indapamide.  The following portions of the patient's history were reviewed and updated as appropriate: allergies, current medications, past family history, past medical history, past social history, past surgical history and problem list.  Review of Systems Pertinent items are noted in HPI.     Objective:    BP 132/80  Pulse 68  Temp(Src) 98.4 F (36.9 C) (Oral)  Wt 194 lb 3.2 oz (88.089 kg)  BMI 33.32 kg/m2  SpO2 97% General appearance: alert, cooperative, appears stated age and no distress Lungs: clear to auscultation bilaterally Heart: S1, S2 normal Extremities: extremities normal, atraumatic, no cyanosis or edema    Assessment:    Hypertension, normal blood pressure . Evidence of target organ damage: none.    Plan:    Medication: discontinue spironolactone and begin indapamide. Dietary sodium restriction. Regular aerobic exercise. Check blood pressures 2-3 times weekly and record. Follow up: 6 months and as needed.

## 2013-07-03 ENCOUNTER — Encounter: Payer: Self-pay | Admitting: Family Medicine

## 2013-08-04 ENCOUNTER — Other Ambulatory Visit: Payer: Self-pay | Admitting: Family Medicine

## 2013-10-20 ENCOUNTER — Other Ambulatory Visit: Payer: Self-pay | Admitting: Family Medicine

## 2013-10-28 ENCOUNTER — Other Ambulatory Visit: Payer: Self-pay | Admitting: Family Medicine

## 2013-10-30 ENCOUNTER — Other Ambulatory Visit: Payer: Self-pay

## 2013-11-14 ENCOUNTER — Other Ambulatory Visit: Payer: Self-pay | Admitting: Family Medicine

## 2013-12-17 ENCOUNTER — Other Ambulatory Visit: Payer: Self-pay | Admitting: Family Medicine

## 2013-12-17 NOTE — Telephone Encounter (Signed)
Spironolactone refilled per protocol. JG//CMA

## 2013-12-26 ENCOUNTER — Other Ambulatory Visit: Payer: Self-pay | Admitting: Family Medicine

## 2014-01-28 ENCOUNTER — Other Ambulatory Visit: Payer: Self-pay | Admitting: Family Medicine

## 2014-02-12 ENCOUNTER — Other Ambulatory Visit: Payer: Self-pay | Admitting: Family Medicine

## 2014-02-27 ENCOUNTER — Other Ambulatory Visit: Payer: Self-pay | Admitting: Family Medicine

## 2014-03-11 ENCOUNTER — Other Ambulatory Visit: Payer: Self-pay | Admitting: Family Medicine

## 2014-03-16 ENCOUNTER — Ambulatory Visit: Payer: BC Managed Care – PPO | Admitting: Family Medicine

## 2014-03-18 ENCOUNTER — Encounter: Payer: BC Managed Care – PPO | Admitting: Family Medicine

## 2014-03-30 ENCOUNTER — Ambulatory Visit (INDEPENDENT_AMBULATORY_CARE_PROVIDER_SITE_OTHER): Payer: BC Managed Care – PPO | Admitting: Family Medicine

## 2014-03-30 ENCOUNTER — Other Ambulatory Visit: Payer: Self-pay | Admitting: Family Medicine

## 2014-03-30 ENCOUNTER — Encounter: Payer: Self-pay | Admitting: Family Medicine

## 2014-03-30 VITALS — BP 110/70 | HR 78 | Temp 98.4°F | Wt 204.0 lb

## 2014-03-30 DIAGNOSIS — B3731 Acute candidiasis of vulva and vagina: Secondary | ICD-10-CM

## 2014-03-30 DIAGNOSIS — J209 Acute bronchitis, unspecified: Secondary | ICD-10-CM

## 2014-03-30 DIAGNOSIS — B373 Candidiasis of vulva and vagina: Secondary | ICD-10-CM

## 2014-03-30 DIAGNOSIS — R05 Cough: Secondary | ICD-10-CM

## 2014-03-30 DIAGNOSIS — R0789 Other chest pain: Secondary | ICD-10-CM

## 2014-03-30 DIAGNOSIS — R059 Cough, unspecified: Secondary | ICD-10-CM

## 2014-03-30 MED ORDER — ALBUTEROL SULFATE (2.5 MG/3ML) 0.083% IN NEBU
2.5000 mg | INHALATION_SOLUTION | Freq: Once | RESPIRATORY_TRACT | Status: AC
  Administered 2014-03-30: 2.5 mg via RESPIRATORY_TRACT

## 2014-03-30 MED ORDER — FLUCONAZOLE 150 MG PO TABS: ORAL_TABLET | ORAL | Status: DC

## 2014-03-30 MED ORDER — CLARITHROMYCIN ER 500 MG PO TB24: 1000.0000 mg | ORAL_TABLET | Freq: Every day | ORAL | Status: AC

## 2014-03-30 NOTE — Progress Notes (Signed)
Pre visit review using our clinic review tool, if applicable. No additional management support is needed unless otherwise documented below in the visit note. 

## 2014-03-30 NOTE — Progress Notes (Signed)
  Subjective:     Lindsey [name] is a 48 y.o. female here for evaluation of a cough. Onset of symptoms was 4 weeks ago. Symptoms have been gradually worsening since that time. The cough is barky and dry and is aggravated by pollens and reclining position. Associated symptoms include: shortness of breath and wheezing. Patient does not have a history of asthma. Patient does have a history of environmental allergens. Patient has not traveled recently. Patient does not have a history of smoking. Patient has not had a previous chest x-ray. Patient has not had a PPD done.  The following portions of the patient's history were reviewed and updated as appropriate: allergies, current medications, past family history, past medical history, past social history, past surgical history and problem list.  Review of Systems Pertinent items are noted in HPI.    Objective:    Oxygen saturation 97% on room air BP 110/70  Pulse 78  Temp(Src) 98.4 F (36.9 C) (Oral)  Wt 204 lb (92.534 kg)  SpO2 97% General appearance: alert, cooperative, appears stated age and mild distress Ears: normal TM's and external ear canals both ears Nose: Nares normal. Septum midline. Mucosa normal. No drainage or sinus tenderness. Throat: lips, mucosa, and tongue normal; teeth and gums normal Neck: no adenopathy, supple, symmetrical, trachea midline and thyroid not enlarged, symmetric, no tenderness/mass/nodules Lungs: wheezes bilaterally Heart: S1, S2 normal    Assessment:    Acute Bronchitis    Plan:    Antibiotics per medication orders. Avoid exposure to tobacco smoke and fumes. B-agonist inhaler. Call if shortness of breath worsens, blood in sputum, change in character of cough, development of fever or chills, inability to maintain nutrition and hydration. Avoid exposure to tobacco smoke and fumes. asmanex

## 2014-04-13 ENCOUNTER — Other Ambulatory Visit: Payer: Self-pay | Admitting: Family Medicine

## 2014-04-21 ENCOUNTER — Other Ambulatory Visit: Payer: Self-pay | Admitting: Family Medicine

## 2014-05-04 ENCOUNTER — Other Ambulatory Visit: Payer: Self-pay | Admitting: Family Medicine

## 2014-05-04 ENCOUNTER — Encounter: Payer: BC Managed Care – PPO | Admitting: Family Medicine

## 2014-05-14 ENCOUNTER — Encounter: Payer: Self-pay | Admitting: Family Medicine

## 2014-05-14 ENCOUNTER — Ambulatory Visit (INDEPENDENT_AMBULATORY_CARE_PROVIDER_SITE_OTHER): Payer: BC Managed Care – PPO | Admitting: Family Medicine

## 2014-05-14 VITALS — BP 118/80 | HR 67 | Temp 98.0°F | Ht 65.0 in | Wt 202.0 lb

## 2014-05-14 DIAGNOSIS — R319 Hematuria, unspecified: Secondary | ICD-10-CM

## 2014-05-14 DIAGNOSIS — E785 Hyperlipidemia, unspecified: Secondary | ICD-10-CM

## 2014-05-14 DIAGNOSIS — E538 Deficiency of other specified B group vitamins: Secondary | ICD-10-CM

## 2014-05-14 DIAGNOSIS — E669 Obesity, unspecified: Secondary | ICD-10-CM | POA: Insufficient documentation

## 2014-05-14 DIAGNOSIS — I10 Essential (primary) hypertension: Secondary | ICD-10-CM

## 2014-05-14 DIAGNOSIS — Z0000 Encounter for general adult medical examination without abnormal findings: Secondary | ICD-10-CM

## 2014-05-14 LAB — LIPID PANEL
Cholesterol: 179 mg/dL (ref 0–200)
HDL: 52.6 mg/dL (ref >39.00)
LDL Cholesterol: 114 mg/dL — ABNORMAL HIGH (ref 0–99)
Total CHOL/HDL Ratio: 3
Triglycerides: 62 mg/dL (ref 0.0–149.0)
VLDL: 12.4 mg/dL (ref 0.0–40.0)

## 2014-05-14 LAB — CBC WITH DIFFERENTIAL/PLATELET
Basophils Absolute: 0 10*3/uL (ref 0.0–0.1)
Basophils Relative: 0.6 % (ref 0.0–3.0)
Eosinophils Absolute: 0.2 10*3/uL (ref 0.0–0.7)
Eosinophils Relative: 2 % (ref 0.0–5.0)
HCT: 42.3 % (ref 36.0–46.0)
Hemoglobin: 13.9 g/dL (ref 12.0–15.0)
Lymphocytes Relative: 39.1 % (ref 12.0–46.0)
Lymphs Abs: 3.1 10*3/uL (ref 0.7–4.0)
MCHC: 32.9 g/dL (ref 30.0–36.0)
MCV: 85.7 fl (ref 78.0–100.0)
Monocytes Absolute: 0.6 10*3/uL (ref 0.1–1.0)
Monocytes Relative: 7.9 % (ref 3.0–12.0)
Neutro Abs: 4 10*3/uL (ref 1.4–7.7)
Neutrophils Relative %: 50.4 % (ref 43.0–77.0)
Platelets: 272 10*3/uL (ref 150.0–400.0)
RBC: 4.94 Mil/uL (ref 3.87–5.11)
RDW: 15.1 % (ref 11.5–15.5)
WBC: 7.9 10*3/uL (ref 4.0–10.5)

## 2014-05-14 LAB — BASIC METABOLIC PANEL
BUN: 22 mg/dL (ref 6–23)
CO2: 27 mEq/L (ref 19–32)
Calcium: 9.1 mg/dL (ref 8.4–10.5)
Chloride: 103 mEq/L (ref 96–112)
Creatinine, Ser: 0.6 mg/dL (ref 0.4–1.2)
GFR: 105.43 mL/min (ref >60.00)
Glucose, Bld: 88 mg/dL (ref 70–99)
Potassium: 4 mEq/L (ref 3.5–5.1)
Sodium: 138 mEq/L (ref 135–145)

## 2014-05-14 LAB — HEPATIC FUNCTION PANEL
ALT: 21 U/L (ref 0–35)
AST: 23 U/L (ref 0–37)
Albumin: 4.1 g/dL (ref 3.5–5.2)
Alkaline Phosphatase: 59 U/L (ref 39–117)
Bilirubin, Direct: 0.1 mg/dL (ref 0.0–0.3)
Total Bilirubin: 0.8 mg/dL (ref 0.2–1.2)
Total Protein: 7.5 g/dL (ref 6.0–8.3)

## 2014-05-14 LAB — POCT URINALYSIS DIPSTICK
Bilirubin, UA: NEGATIVE
Glucose, UA: NEGATIVE
Ketones, UA: NEGATIVE
Leukocytes, UA: NEGATIVE
Nitrite, UA: NEGATIVE
Spec Grav, UA: 1.015
Urobilinogen, UA: 0.2
pH, UA: 6

## 2014-05-14 LAB — VITAMIN B12: Vitamin B-12: >1500 pg/mL — ABNORMAL HIGH (ref 211–911)

## 2014-05-14 LAB — TSH: TSH: 1.22 u[IU]/mL (ref 0.35–4.50)

## 2014-05-14 NOTE — Progress Notes (Signed)
Subjective:     Lindsey [name] is a 48 y.o. female and is here for a comprehensive physical exam. The patient reports no problems.  History   Social History  . Marital Status: Married    Spouse Name: N/A    Number of Children: N/A  . Years of Education: N/A   Occupational History  . Not on file.   Social History Main Topics  . Smoking status: Never Smoker   . Smokeless tobacco: Not on file  . Alcohol Use: No  . Drug Use: No  . Sexual Activity: Yes   Other Topics Concern  . Not on file   Social History Narrative   Occupation: Babysit   Married   Regular exercise- yes   Health Maintenance  Topic Date Due  . Mammogram  05/22/2014  . Influenza Vaccine  07/25/2014  . Pap Smear  05/25/2016  . Tetanus/tdap  05/14/2022    The following portions of the patient's history were reviewed and updated as appropriate:  She  has a past medical history of Hyperlipidemia and Hypertension. She  does not have any pertinent problems on file. She  has past surgical history that includes LEEP and Lithotripsy. Her family history includes Alzheimer's disease in her father; Dementia in her father; Diabetes in an other family member; Hyperlipidemia in an other family member; Hypertension in an other family member. She  reports that she has never smoked. She does not have any smokeless tobacco history on file. She reports that she does not drink alcohol or use illicit drugs. She has a current medication list which includes the following prescription(s): amlodipine, cetirizine, cyanocobalamin, fluoxetine, lovaza, montelukast, potassium citrate, spironolactone, valsartan, and zetia. Current Outpatient Prescriptions on File Prior to Visit  Medication Sig Dispense Refill  . amLODipine (NORVASC) 5 MG tablet TAKE 1 TABLET EVERY EVENING  90 tablet  1  . cetirizine (ZYRTEC) 10 MG tablet Take 10 mg by mouth daily.        . Cyanocobalamin (VITAMIN B 12 PO) Take by mouth.        Marland Kitchen LOVAZA 1 G capsule  TAKE 2 CAPSULES BY MOUTH EVERY MORNING AND TAKE 2 CAPSULES BY MOUTH EVERY EVENING  120 capsule  0  . montelukast (SINGULAIR) 10 MG tablet Take 1 tablet by mouth daily.      . potassium citrate (UROCIT-K) 10 MEQ (1080 MG) SR tablet Take 2 tablets by mouth daily.      Marland Kitchen spironolactone (ALDACTONE) 25 MG tablet 1 tab by mouth daily--office visit due now  30 tablet  0  . valsartan (DIOVAN) 320 MG tablet TAKE ONE (1) TABLET EACH DAY  30 tablet  5  . ZETIA 10 MG tablet TAKE ONE (1) TABLET EACH DAY  30 tablet  0   No current facility-administered medications on file prior to visit.   She is allergic to macrobid..  Review of Systems Review of Systems  Constitutional: Negative for activity change, appetite change and fatigue.  HENT: Negative for hearing loss, congestion, tinnitus and ear discharge.  dentist q3mEyes: Negative for visual disturbance (see optho q1y -- vision corrected to 20/20 with glasses).  Respiratory: Negative for cough, chest tightness and shortness of breath.   Cardiovascular: Negative for chest pain, palpitations and leg swelling.  Gastrointestinal: Negative for abdominal pain, diarrhea, constipation and abdominal distention.  Genitourinary: Negative for urgency, frequency, decreased urine volume and difficulty urinating.  Musculoskeletal: Negative for back pain, arthralgias and gait problem.  Skin: Negative for color change, pallor  and rash.  Neurological: Negative for dizziness, light-headedness, numbness and headaches.  Hematological: Negative for adenopathy. Does not bruise/bleed easily.  Psychiatric/Behavioral: Negative for suicidal ideas, confusion, sleep disturbance, self-injury, dysphoric mood, decreased concentration and agitation.       Objective:    BP 118/80  Pulse 67  Temp(Src) 98 F (36.7 C) (Oral)  Ht 5' 5"  (1.651 m)  Wt 202 lb (91.627 kg)  BMI 33.61 kg/m2  SpO2 97% General appearance: alert, cooperative, appears stated age and no distress Head:  Normocephalic, without obvious abnormality, atraumatic Eyes: conjunctivae/corneas clear. PERRL, EOM's intact. Fundi benign. Ears: normal TM's and external ear canals both ears Nose: Nares normal. Septum midline. Mucosa normal. No drainage or sinus tenderness. Throat: lips, mucosa, and tongue normal; teeth and gums normal Neck: no adenopathy, supple, symmetrical, trachea midline and thyroid not enlarged, symmetric, no tenderness/mass/nodules Back: symmetric, no curvature. ROM normal. No CVA tenderness. Lungs: clear to auscultation bilaterally Breasts: normal appearance, no masses or tenderness Heart: regular rate and rhythm, S1, S2 normal, no murmur, click, rub or gallop Abdomen: soft, non-tender; bowel sounds normal; no masses,  no organomegaly Pelvic: deferred Extremities: extremities normal, atraumatic, no cyanosis or edema Pulses: 2+ and symmetric Skin: Skin color, texture, turgor normal. No rashes or lesions Lymph nodes: Cervical, supraclavicular, and axillary nodes normal. Neurologic: Alert and oriented X 3, normal strength and tone. Normal symmetric reflexes. Normal coordination and gait Psych--no depression, no anxiety      Assessment:    Healthy female exam.     Plan:    ghm utd Check labs See After Visit Summary for Counseling Recommendations   1. Preventative health care  - Basic metabolic panel - CBC with Differential - Hepatic function panel - Lipid panel - POCT urinalysis dipstick - TSH  2. HTN (hypertension) stable - Basic metabolic panel - CBC with Differential - Hepatic function panel - Lipid panel - POCT urinalysis dipstick - TSH  3. Other and unspecified hyperlipidemia Check labs - Basic metabolic panel - CBC with Differential - Hepatic function panel - Lipid panel - POCT urinalysis dipstick - TSH  4. B12 deficiency   - Vitamin B12

## 2014-05-14 NOTE — Patient Instructions (Signed)

## 2014-05-14 NOTE — Addendum Note (Signed)
Addended by: Harl Bowie on: 05/14/2014 04:30 PM   Modules accepted: Orders

## 2014-05-14 NOTE — Progress Notes (Signed)
Pre visit review using our clinic review tool, if applicable. No additional management support is needed unless otherwise documented below in the visit note. 

## 2014-05-16 LAB — URINE CULTURE: Colony Count: 30000

## 2014-05-20 ENCOUNTER — Telehealth: Payer: Self-pay | Admitting: Family Medicine

## 2014-05-20 DIAGNOSIS — N390 Urinary tract infection, site not specified: Secondary | ICD-10-CM

## 2014-05-20 NOTE — Telephone Encounter (Signed)
Patient will come in tomorrow to leave a sample.      KP

## 2014-05-20 NOTE — Telephone Encounter (Signed)
Caller name: Yanci  Call back number:[phone]:  Pharmacy: [facility]  Reason for call:  Pt has questions about urine sample that was given.  Pt states that test showed that she might have or be getting an UTI.  Pt thinks she might be getting one.  Can she get something called in.

## 2014-05-21 ENCOUNTER — Other Ambulatory Visit: Payer: Self-pay | Admitting: Family Medicine

## 2014-05-21 ENCOUNTER — Other Ambulatory Visit (INDEPENDENT_AMBULATORY_CARE_PROVIDER_SITE_OTHER): Payer: BC Managed Care – PPO

## 2014-05-21 DIAGNOSIS — N390 Urinary tract infection, site not specified: Secondary | ICD-10-CM

## 2014-05-22 ENCOUNTER — Other Ambulatory Visit: Payer: Self-pay | Admitting: Family Medicine

## 2014-05-23 LAB — URINE CULTURE
Colony Count: NO GROWTH
Organism ID, Bacteria: NO GROWTH

## 2014-05-25 LAB — POCT URINALYSIS DIPSTICK
Bilirubin, UA: NEGATIVE
Blood, UA: NEGATIVE
Glucose, UA: NEGATIVE
Ketones, UA: NEGATIVE
Leukocytes, UA: NEGATIVE
Nitrite, UA: NEGATIVE
Protein, UA: NEGATIVE
Spec Grav, UA: 1.025
Urobilinogen, UA: 0.2
pH, UA: 6

## 2014-06-03 ENCOUNTER — Other Ambulatory Visit: Payer: Self-pay | Admitting: Obstetrics & Gynecology

## 2014-06-03 DIAGNOSIS — N200 Calculus of kidney: Secondary | ICD-10-CM | POA: Insufficient documentation

## 2014-06-03 DIAGNOSIS — R928 Other abnormal and inconclusive findings on diagnostic imaging of breast: Secondary | ICD-10-CM

## 2014-06-17 ENCOUNTER — Ambulatory Visit
Admission: RE | Admit: 2014-06-17 | Discharge: 2014-06-17 | Disposition: A | Discharge status: Home / Self Care | Payer: BC Managed Care – PPO | Source: Ambulatory Visit | Attending: Obstetrics & Gynecology | Admitting: Obstetrics & Gynecology

## 2014-06-17 DIAGNOSIS — R928 Other abnormal and inconclusive findings on diagnostic imaging of breast: Secondary | ICD-10-CM

## 2014-06-22 ENCOUNTER — Other Ambulatory Visit: Payer: Self-pay | Admitting: Family Medicine

## 2014-09-21 ENCOUNTER — Ambulatory Visit (INDEPENDENT_AMBULATORY_CARE_PROVIDER_SITE_OTHER): Payer: BC Managed Care – PPO | Admitting: Family

## 2014-09-21 ENCOUNTER — Encounter: Payer: Self-pay | Admitting: Family

## 2014-09-21 VITALS — BP 123/68 | HR 75 | Temp 98.0°F | Resp 16 | Ht 65.0 in | Wt 203.8 lb

## 2014-09-21 DIAGNOSIS — I10 Essential (primary) hypertension: Secondary | ICD-10-CM

## 2014-09-21 DIAGNOSIS — Z23 Encounter for immunization: Secondary | ICD-10-CM

## 2014-09-21 DIAGNOSIS — L03012 Cellulitis of left finger: Secondary | ICD-10-CM

## 2014-09-21 DIAGNOSIS — IMO0002 Reserved for concepts with insufficient information to code with codable children: Secondary | ICD-10-CM | POA: Insufficient documentation

## 2014-09-21 MED ORDER — CEPHALEXIN 500 MG PO CAPS: 500.0000 mg | ORAL_CAPSULE | Freq: Two times a day (BID) | ORAL | Status: DC

## 2014-09-21 NOTE — Progress Notes (Signed)
Subjective:    Patient ID: Lindsey [name], female    DOB: 11-09-1966, 47 y.o.   MRN: [id number]  HPI  Lindsey [name] is a 48 yr old female who presents today with chief complaint of ingrown fingernail x 10 days.  Reports that area was red and had pus draining.   HTN- She reports that she is on spironolactone for blood pressure but her urologist told her to stop the spironolactone.  She would like med changed to something different.   BP Readings from Last 3 Encounters:  09/21/14 123/68  05/14/14 118/80  03/30/14 110/70   Lesion on back- area is tender has been present for several months.  Initially thought it was a bug bite.      Review of Systems See HPI  Past Medical History  Diagnosis Date  . Hyperlipidemia   . Hypertension     History   Social History  . Marital Status: Married    Spouse Name: N/A    Number of Children: N/A  . Years of Education: N/A   Occupational History  . Not on file.   Social History Main Topics  . Smoking status: Never Smoker   . Smokeless tobacco: Not on file  . Alcohol Use: No  . Drug Use: No  . Sexual Activity: Yes   Other Topics Concern  . Not on file   Social History Narrative   Occupation: Babysit   Married   Regular exercise- yes    Past Surgical History  Procedure Laterality Date  . Leep    . Lithotripsy      Family History  Problem Relation Age of Onset  . Dementia Father   . Alzheimer's disease Father   . Hyperlipidemia    . Hypertension    . Diabetes      Allergies  Allergen Reactions  . Macrobid [Nitrofurantoin Macrocrystal] Other (See Comments)    abd pain    Current Outpatient Prescriptions on File Prior to Visit  Medication Sig Dispense Refill  . amLODipine (NORVASC) 5 MG tablet TAKE 1 TABLET EVERY EVENING  90 tablet  1  . cetirizine (ZYRTEC) 10 MG tablet Take 10 mg by mouth daily.        . Cyanocobalamin (VITAMIN B 12 PO) Take by mouth.        Marland Kitchen FLUoxetine (PROZAC) 20 MG capsule TAKE ONE (1)  CAPSULE EACH DAY  30 capsule  5  . montelukast (SINGULAIR) 10 MG tablet Take 1 tablet by mouth daily.      Marland Kitchen omega-3 acid ethyl esters (LOVAZA) 1 G capsule TAKE 2 CAPSULES BY MOUTH TWICE A DAY  120 capsule  2  . potassium citrate (UROCIT-K) 10 MEQ (1080 MG) SR tablet Take 2 tablets by mouth daily.      Marland Kitchen spironolactone (ALDACTONE) 25 MG tablet Take 1 tablet (25 mg total) by mouth daily.  30 tablet  5  . valsartan (DIOVAN) 320 MG tablet TAKE ONE (1) TABLET EACH DAY  30 tablet  5  . ZETIA 10 MG tablet TAKE ONE (1) TABLET EACH DAY  30 tablet  5   No current facility-administered medications on file prior to visit.    BP 123/68  Pulse 75  Temp(Src) 98 F (36.7 C) (Oral)  Resp 16  Ht 5' 5"  (1.651 m)  Wt 203 lb 12.8 oz (92.443 kg)  BMI 33.91 kg/m2  SpO2 98%       Objective:   Physical Exam  Constitutional: She appears well-developed and  well-nourished. No distress.  Skin: Skin is warm and dry.  Left middle finger, some swelling at base of nailbed with minimal erythema.           Assessment & Plan:

## 2014-09-21 NOTE — Progress Notes (Signed)
Pre visit review using our clinic review tool, if applicable. No additional management support is needed unless otherwise documented below in the visit note. 

## 2014-09-21 NOTE — Patient Instructions (Signed)
Start Keflex twice daily for your fingernail. Soak finger twice daily in warm water and then apply antibiotic ointment. Call if increased pain/swelling/redness of nailbed.

## 2014-09-21 NOTE — Assessment & Plan Note (Signed)
Will contact Dr. Nevada Crane to discuss potential med changes.

## 2014-09-21 NOTE — Assessment & Plan Note (Signed)
Mild, Start Keflex twice daily for fingernail. Soak finger twice daily in warm water and then apply antibiotic ointment. Call if increased pain/swelling/redness of nailbed.

## 2014-09-25 ENCOUNTER — Telehealth: Payer: Self-pay | Admitting: Family

## 2014-09-25 NOTE — Telephone Encounter (Signed)
Left message requesting that pt return my call. Please let pt know that I reviewed Dr. Juel Burrow most recent note. He was made aware that she was off of spironolactone at that time. I would recommend that she stop spironolactone and instead increase amlodipine to 1.5 tabs by mouth once daily. Return to see Dr. Etter Sjogren in 2 weeks for BP recheck.

## 2014-09-25 NOTE — Telephone Encounter (Signed)
Patient scheduled a BP check with the RN for 10/09/2014 (dr. Etter Sjogren will not be here until the end of October). Pt wanted to know if Dr. Nevada Crane was okay with the med change.

## 2014-09-25 NOTE — Telephone Encounter (Signed)
Notified pt and she voices understanding. 

## 2014-09-28 ENCOUNTER — Telehealth: Payer: Self-pay | Admitting: Family Medicine

## 2014-09-28 NOTE — Telephone Encounter (Signed)
Spoke with [facility] and gave ok to send to [facility]. Patient did not want to be seen by any of the open positions that were available at other sites.

## 2014-09-28 NOTE — Telephone Encounter (Signed)
Patient Information:  Caller Name: Lindsey [name]  Phone: [phone]  Patient: Lindsey [name]  Gender: Female  DOB: Feb 22, 1966  Age: 48 Years  PCP: Rosalita Chessman.  Pregnant: No  Office Follow Up:  Does the office need to follow up with this patient?: No  Instructions For The Office: No appt available at [facility]; offered appt at  a different location but pt states that she will go to [facility] today.  Office called and Abner Greenspan made aware that pt is going to [facility]. No appt noted in office   Symptoms  Reason For Call & Symptoms: Pt is calling and states that she has had abdominal pain for the last 2 days; pain comes and goes; no fever; pain located to LLQ;  denies V/D;  Reviewed Health History In EMR: Yes  Reviewed Medications In EMR: Yes  Reviewed Allergies In EMR: Yes  Reviewed Surgeries / Procedures: Yes  Date of Onset of Symptoms: 09/26/2014  Treatments Tried: Advil  Treatments Tried Worked: No OB / GYN:  LMP: Unknown  Guideline(s) Used:  Abdominal Pain - Female  Disposition Per Guideline:   See Today in Office  Reason For Disposition Reached:   Moderate or mild pain that comes and goes (cramps) lasts > 24 hours  Advice Given:  Call Back If:  You become worse.  Patient Will Follow Care Advice:  YES

## 2014-10-13 ENCOUNTER — Ambulatory Visit: Payer: BC Managed Care – PPO

## 2014-10-13 VITALS — BP 138/82

## 2014-10-13 DIAGNOSIS — I10 Essential (primary) hypertension: Secondary | ICD-10-CM

## 2014-10-13 NOTE — Progress Notes (Signed)
Patient ID: Lindsey [name], female   DOB: [birth year], 48 y.o.   MRN: [id number] Pre visit review using our clinic review tool, if applicable. No additional management support is needed unless otherwise documented below in the visit note.  Patient stated that she did not increase the amlodipine dosage as directed.  BP today 138/82

## 2014-11-05 ENCOUNTER — Other Ambulatory Visit: Payer: Self-pay | Admitting: Family Medicine

## 2014-11-10 ENCOUNTER — Telehealth: Payer: Self-pay

## 2014-11-10 DIAGNOSIS — I10 Essential (primary) hypertension: Secondary | ICD-10-CM

## 2014-11-10 DIAGNOSIS — E785 Hyperlipidemia, unspecified: Secondary | ICD-10-CM

## 2014-11-10 NOTE — Telephone Encounter (Signed)
Lindsey [name] called and wanted to schedule her 6 month lab check, so I put he ron schedule for tomorrow. Will you look an dput in for her.

## 2014-11-10 NOTE — Telephone Encounter (Signed)
Orders are in.     KP

## 2014-11-11 ENCOUNTER — Other Ambulatory Visit (INDEPENDENT_AMBULATORY_CARE_PROVIDER_SITE_OTHER): Payer: BC Managed Care – PPO

## 2014-11-11 DIAGNOSIS — E785 Hyperlipidemia, unspecified: Secondary | ICD-10-CM

## 2014-11-11 DIAGNOSIS — I10 Essential (primary) hypertension: Secondary | ICD-10-CM

## 2014-11-11 LAB — BASIC METABOLIC PANEL
BUN: 16 mg/dL (ref 6–23)
CO2: 25 mEq/L (ref 19–32)
Calcium: 9.3 mg/dL (ref 8.4–10.5)
Chloride: 110 mEq/L (ref 96–112)
Creatinine, Ser: 0.7 mg/dL (ref 0.4–1.2)
GFR: 88.98 mL/min (ref >60.00)
Glucose, Bld: 106 mg/dL — ABNORMAL HIGH (ref 70–99)
Potassium: 4.4 mEq/L (ref 3.5–5.1)
Sodium: 145 mEq/L (ref 135–145)

## 2014-11-11 LAB — HEPATIC FUNCTION PANEL
ALT: 17 U/L (ref 0–35)
AST: 22 U/L (ref 0–37)
Albumin: 4.1 g/dL (ref 3.5–5.2)
Alkaline Phosphatase: 74 U/L (ref 39–117)
Bilirubin, Direct: 0.2 mg/dL (ref 0.0–0.3)
Total Bilirubin: 0.7 mg/dL (ref 0.2–1.2)
Total Protein: 7.1 g/dL (ref 6.0–8.3)

## 2014-11-11 LAB — LIPID PANEL
Cholesterol: 165 mg/dL (ref 0–200)
HDL: 48.8 mg/dL (ref >39.00)
LDL Cholesterol: 102 mg/dL — ABNORMAL HIGH (ref 0–99)
NonHDL: 116.2
Total CHOL/HDL Ratio: 3
Triglycerides: 69 mg/dL (ref 0.0–149.0)
VLDL: 13.8 mg/dL (ref 0.0–40.0)

## 2014-11-18 ENCOUNTER — Other Ambulatory Visit: Payer: Self-pay | Admitting: Family Medicine

## 2014-12-07 ENCOUNTER — Other Ambulatory Visit: Payer: Self-pay | Admitting: Family Medicine

## 2014-12-10 ENCOUNTER — Other Ambulatory Visit: Payer: Self-pay | Admitting: Family Medicine

## 2014-12-10 NOTE — Telephone Encounter (Signed)
Med filled.  Appt scheduled for 12/15/14 @ 2:15 pm.

## 2014-12-10 NOTE — Telephone Encounter (Signed)
Refill x 3 months

## 2014-12-15 ENCOUNTER — Ambulatory Visit (INDEPENDENT_AMBULATORY_CARE_PROVIDER_SITE_OTHER): Payer: BC Managed Care – PPO | Admitting: Family Medicine

## 2014-12-15 ENCOUNTER — Encounter: Payer: Self-pay | Admitting: Family Medicine

## 2014-12-15 VITALS — BP 120/76 | HR 76 | Temp 98.5°F | Wt 204.8 lb

## 2014-12-15 DIAGNOSIS — E876 Hypokalemia: Secondary | ICD-10-CM

## 2014-12-15 MED ORDER — VALSARTAN 320 MG PO TABS: ORAL_TABLET | ORAL | Status: DC

## 2014-12-15 NOTE — Progress Notes (Signed)
Pre visit review using our clinic review tool, if applicable. No additional management support is needed unless otherwise documented below in the visit note. 

## 2014-12-15 NOTE — Patient Instructions (Signed)

## 2014-12-15 NOTE — Progress Notes (Signed)
  Subjective:    Patient here for follow-up of elevated blood pressure.  Lindsey [name] is exercising and is adherent to a low-salt diet.  Blood pressure is well controlled at home. Cardiac symptoms: none. Patient denies: chest pain, chest pressure/discomfort, claudication, dyspnea, exertional chest pressure/discomfort, fatigue, irregular heart beat, lower extremity edema, near-syncope, orthopnea, palpitations, paroxysmal nocturnal dyspnea, syncope and tachypnea. Cardiovascular risk factors: dyslipidemia, hypertension and obesity (BMI >= 30 kg/m2). Use of agents associated with hypertension: none. History of target organ damage: none.  The following portions of the patient's history were reviewed and updated as appropriate:  Lindsey [name]  has a past medical history of Hyperlipidemia and Hypertension. Lindsey [name]  does not have any pertinent problems on file. Lindsey [name]  has past surgical history that includes LEEP and Lithotripsy. Her family history includes Alzheimer's disease in her father; Dementia in her father; Diabetes in an other family member; Hyperlipidemia in an other family member; Hypertension in an other family member. Lindsey [name]  reports that Lindsey [name] has never smoked. Lindsey [name] does not have any smokeless tobacco history on file. Lindsey [name] reports that Lindsey [name] does not drink alcohol or use illicit drugs. Lindsey [name] has a current medication list which includes the following prescription(s): amlodipine, cetirizine, cyanocobalamin, fluoxetine, montelukast, omega-3 acid ethyl esters, potassium citrate, valsartan, and zetia. Current Outpatient Prescriptions on File Prior to Visit  Medication Sig Dispense Refill  . amLODipine (NORVASC) 5 MG tablet Take 1.5 tablets (7.5 mg total) by mouth every evening. Office visit due now 135 tablet 0  . cetirizine (ZYRTEC) 10 MG tablet Take 10 mg by mouth daily.      . Cyanocobalamin (VITAMIN B 12 PO) Take by mouth.      Marland Kitchen FLUoxetine (PROZAC) 20 MG capsule TAKE ONE (1) CAPSULE EACH DAY 30 capsule 5  . montelukast  (SINGULAIR) 10 MG tablet Take 1 tablet by mouth daily.    Marland Kitchen omega-3 acid ethyl esters (LOVAZA) 1 G capsule TAKE 2 CAPSULES BY MOUTH TWICE A DAY 120 capsule 2  . potassium citrate (UROCIT-K) 10 MEQ (1080 MG) SR tablet Take 2 tablets by mouth daily.    Marland Kitchen ZETIA 10 MG tablet TAKE ONE (1) TABLET BY MOUTH EVERY DAY 30 tablet 5   No current facility-administered medications on file prior to visit.   Lindsey [name] is allergic to macrobid..  Review of Systems Pertinent items are noted in HPI.     Objective:    BP 120/76 mmHg  Pulse 76  Temp(Src) 98.5 F (36.9 C) (Oral)  Wt 204 lb 12.8 oz (92.897 kg)  SpO2 96% General appearance: alert, cooperative, appears stated age and no distress Throat: lips, mucosa, and tongue normal; teeth and gums normal Neck: no adenopathy, no carotid bruit, no JVD, supple, symmetrical, trachea midline and thyroid not enlarged, symmetric, no tenderness/mass/nodules Lungs: clear to auscultation bilaterally Heart: S1, S2 normal Extremities: extremities normal, atraumatic, no cyanosis or edema    Assessment:    Hypertension, normal blood pressure. Evidence of target organ damage: none.    Plan:    Medication: no change. Dietary sodium restriction. Regular aerobic exercise. Follow up: 6 months and as needed.    1. Hypokalemia-- recheck today - Basic metabolic panel

## 2015-01-05 ENCOUNTER — Emergency Department (HOSPITAL_BASED_OUTPATIENT_CLINIC_OR_DEPARTMENT_OTHER): Payer: BLUE CROSS/BLUE SHIELD

## 2015-01-05 ENCOUNTER — Encounter (HOSPITAL_BASED_OUTPATIENT_CLINIC_OR_DEPARTMENT_OTHER): Payer: Self-pay | Admitting: *Deleted

## 2015-01-05 ENCOUNTER — Emergency Department (HOSPITAL_BASED_OUTPATIENT_CLINIC_OR_DEPARTMENT_OTHER)
Admission: EM | Admit: 2015-01-05 | Discharge: 2015-01-05 | Disposition: A | Discharge status: Home / Self Care | Payer: BLUE CROSS/BLUE SHIELD | Attending: Emergency Medicine | Admitting: Emergency Medicine

## 2015-01-05 DIAGNOSIS — Z8719 Personal history of other diseases of the digestive system: Secondary | ICD-10-CM | POA: Insufficient documentation

## 2015-01-05 DIAGNOSIS — Z8639 Personal history of other endocrine, nutritional and metabolic disease: Secondary | ICD-10-CM | POA: Diagnosis not present

## 2015-01-05 DIAGNOSIS — Z79899 Other long term (current) drug therapy: Secondary | ICD-10-CM | POA: Insufficient documentation

## 2015-01-05 DIAGNOSIS — R110 Nausea: Secondary | ICD-10-CM | POA: Insufficient documentation

## 2015-01-05 DIAGNOSIS — R1032 Left lower quadrant pain: Secondary | ICD-10-CM | POA: Diagnosis present

## 2015-01-05 DIAGNOSIS — Z87442 Personal history of urinary calculi: Secondary | ICD-10-CM | POA: Diagnosis not present

## 2015-01-05 DIAGNOSIS — Z87891 Personal history of nicotine dependence: Secondary | ICD-10-CM | POA: Diagnosis not present

## 2015-01-05 DIAGNOSIS — I10 Essential (primary) hypertension: Secondary | ICD-10-CM | POA: Insufficient documentation

## 2015-01-05 LAB — COMPREHENSIVE METABOLIC PANEL
ALT: 21 U/L (ref 0–35)
AST: 27 U/L (ref 0–37)
Albumin: 4.1 g/dL (ref 3.5–5.2)
Alkaline Phosphatase: 83 U/L (ref 39–117)
Anion gap: 6 (ref 5–15)
BUN: 15 mg/dL (ref 6–23)
CO2: 25 mmol/L (ref 19–32)
Calcium: 8.9 mg/dL (ref 8.4–10.5)
Chloride: 106 mEq/L (ref 96–112)
Creatinine, Ser: 0.54 mg/dL (ref 0.50–1.10)
GFR calc Af Amer: >90 mL/min (ref >90)
GFR calc non Af Amer: >90 mL/min (ref >90)
Glucose, Bld: 100 mg/dL — ABNORMAL HIGH (ref 70–99)
Potassium: 3.8 mmol/L (ref 3.5–5.1)
Sodium: 137 mmol/L (ref 135–145)
Total Bilirubin: 0.6 mg/dL (ref 0.3–1.2)
Total Protein: 7.6 g/dL (ref 6.0–8.3)

## 2015-01-05 LAB — CBC WITH DIFFERENTIAL/PLATELET
Basophils Absolute: 0 10*3/uL (ref 0.0–0.1)
Basophils Relative: 0 % (ref 0–1)
Eosinophils Absolute: 0.1 10*3/uL (ref 0.0–0.7)
Eosinophils Relative: 1 % (ref 0–5)
HCT: 42 % (ref 36.0–46.0)
Hemoglobin: 13.6 g/dL (ref 12.0–15.0)
Lymphocytes Relative: 11 % — ABNORMAL LOW (ref 12–46)
Lymphs Abs: 1.4 10*3/uL (ref 0.7–4.0)
MCH: 27.8 pg (ref 26.0–34.0)
MCHC: 32.4 g/dL (ref 30.0–36.0)
MCV: 85.9 fL (ref 78.0–100.0)
Monocytes Absolute: 0.9 10*3/uL (ref 0.1–1.0)
Monocytes Relative: 7 % (ref 3–12)
Neutro Abs: 10.8 10*3/uL — ABNORMAL HIGH (ref 1.7–7.7)
Neutrophils Relative %: 81 % — ABNORMAL HIGH (ref 43–77)
Platelets: 233 10*3/uL (ref 150–400)
RBC: 4.89 MIL/uL (ref 3.87–5.11)
RDW: 14.1 % (ref 11.5–15.5)
WBC: 13.2 10*3/uL — ABNORMAL HIGH (ref 4.0–10.5)

## 2015-01-05 LAB — URINALYSIS, ROUTINE W REFLEX MICROSCOPIC
Bilirubin Urine: NEGATIVE
Glucose, UA: NEGATIVE mg/dL
Ketones, ur: NEGATIVE mg/dL
Nitrite: NEGATIVE
Protein, ur: NEGATIVE mg/dL
Specific Gravity, Urine: 1.015 (ref 1.005–1.030)
Urobilinogen, UA: 0.2 mg/dL (ref 0.0–1.0)
pH: 7 (ref 5.0–8.0)

## 2015-01-05 LAB — I-STAT CHEM 8, ED
BUN: 15 mg/dL (ref 6–23)
Calcium, Ion: 1.11 mmol/L — ABNORMAL LOW (ref 1.12–1.23)
Chloride: 105 mEq/L (ref 96–112)
Creatinine, Ser: 0.6 mg/dL (ref 0.50–1.10)
Glucose, Bld: 106 mg/dL — ABNORMAL HIGH (ref 70–99)
Potassium: 3.9 mmol/L (ref 3.5–5.1)
Sodium: 140 mmol/L (ref 135–145)
TCO2: 23 mmol/L (ref 0–100)

## 2015-01-05 LAB — URINE MICROSCOPIC-ADD ON

## 2015-01-05 LAB — PREGNANCY, URINE: Preg Test, Ur: NEGATIVE

## 2015-01-05 LAB — LIPASE, BLOOD: Lipase: 33 U/L (ref 11–59)

## 2015-01-05 MED ORDER — ONDANSETRON 4 MG PO TBDP
4.0000 mg | ORAL_TABLET | Freq: Once | ORAL | Status: DC
  Filled 2015-01-05: qty 1

## 2015-01-05 MED ORDER — IOHEXOL 300 MG/ML  SOLN
100.0000 mL | Freq: Once | INTRAMUSCULAR | Status: AC | PRN
  Administered 2015-01-05: 100 mL via INTRAVENOUS

## 2015-01-05 MED ORDER — IOHEXOL 300 MG/ML  SOLN
25.0000 mL | Freq: Once | INTRAMUSCULAR | Status: AC | PRN
  Administered 2015-01-05: 25 mL via ORAL

## 2015-01-05 MED ORDER — SODIUM CHLORIDE 0.9 % IV BOLUS (SEPSIS)
1000.0000 mL | Freq: Once | INTRAVENOUS | Status: AC
  Administered 2015-01-05: 1000 mL via INTRAVENOUS

## 2015-01-05 NOTE — ED Notes (Signed)
Denies blood in stool or hematuria.

## 2015-01-05 NOTE — Discharge Instructions (Signed)
Return to the emergency room with worsening of symptoms, new symptoms or with symptoms that are concerning, especially fevers, unable to hold down liquids, blood in vomit or stool, severe abdominal pain, you feel faint or pass out. Call to make an appointment with your primary care provider as soon as possible for work up and reevaluation for blood in your urine. Read below information and follow all recommendations.   Abdominal Pain Many things can cause abdominal pain. Usually, abdominal pain is not caused by a disease and will improve without treatment. It can often be observed and treated at home. Your health care provider will do a physical exam and possibly order blood tests and X-rays to help determine the seriousness of your pain. However, in many cases, more time must pass before a clear cause of the pain can be found. Before that point, your health care provider may not know if you need more testing or further treatment. HOME CARE INSTRUCTIONS  Monitor your abdominal pain for any changes. The following actions may help to alleviate any discomfort you are experiencing:  Only take over-the-counter or prescription medicines as directed by your health care provider.  Do not take laxatives unless directed to do so by your health care provider.  Try a clear liquid diet (broth, tea, or water) as directed by your health care provider. Slowly move to a bland diet as tolerated. SEEK MEDICAL CARE IF:  You have unexplained abdominal pain.  You have abdominal pain associated with nausea or diarrhea.  You have pain when you urinate or have a bowel movement.  You experience abdominal pain that wakes you in the night.  You have abdominal pain that is worsened or improved by eating food.  You have abdominal pain that is worsened with eating fatty foods.  You have a fever. SEEK IMMEDIATE MEDICAL CARE IF:   Your pain does not go away within 2 hours.  You keep throwing up (vomiting).  Your  pain is felt only in portions of the abdomen, such as the right side or the left lower portion of the abdomen.  You pass bloody or black tarry stools. MAKE SURE YOU:  Understand these instructions.   Will watch your condition.   Will get help right away if you are not doing well or get worse.  Document Released: 09/20/2005 Document Revised: 12/16/2013 Document Reviewed: 08/20/2013 [facility] Patient Information 2015 [place], [place]. This information is not intended to replace advice given to you by your health care provider. Make sure you discuss any questions you have with your health care provider.

## 2015-01-05 NOTE — ED Provider Notes (Signed)
CSN: [id number]     Arrival date & time 01/05/15  1107 History   First MD Initiated Contact with Patient 01/05/15 1158     Chief Complaint  Patient presents with  . Abdominal Pain     (Consider location/radiation/quality/duration/timing/severity/associated sxs/prior Treatment) HPI  Jaki Steptoe is a 49 y.o. female with PMH of hyperlipidemia, hypertension, diverticulosis, nephrolithiasis presenting with 2 day history of left lower quadrant crampy intermittent abdominal pain. At times patient feels nausea during pain but is currently asymptomatic. She denies fevers, chills. No emesis, hematuria, melena, hematochezia, diarrhea. Patient denies history of abdominal surgeries. Patient denies any urinary symptoms or pelvic symptoms. Patient with history of 10 mm kidney stones requiring lithotripsy. She states his pain feels different. Patient has taken Tylenol and ibuprofen with no improvement. Patient eating increasingly normal. Patient states she has had pain like this intermittently for the past 2 months.   Past Medical History  Diagnosis Date  . Hyperlipidemia   . Hypertension    Past Surgical History  Procedure Laterality Date  . Leep    . Lithotripsy     Family History  Problem Relation Age of Onset  . Dementia Father   . Alzheimer's disease Father   . Hyperlipidemia    . Hypertension    . Diabetes     History  Substance Use Topics  . Smoking status: Former Research scientist (life sciences)  . Smokeless tobacco: Not on file  . Alcohol Use: No   OB History    No data available     Review of Systems 10 Systems reviewed and are negative for acute change except as noted in the HPI.    Allergies  Macrobid  Home Medications   Prior to Admission medications   Medication Sig Start Date End Date Taking? Authorizing Provider  amLODipine (NORVASC) 5 MG tablet Take 1.5 tablets (7.5 mg total) by mouth every evening. Office visit due now 12/07/14  Yes Rosalita Chessman, DO  cetirizine (ZYRTEC) 10 MG  tablet Take 10 mg by mouth daily.     Yes Historical Provider, MD  Cyanocobalamin (VITAMIN B 12 PO) Take by mouth.     Yes Historical Provider, MD  FLUoxetine (PROZAC) 20 MG capsule TAKE ONE (1) CAPSULE EACH DAY 11/18/14  Yes Yvonne R Lowne, DO  montelukast (SINGULAIR) 10 MG tablet Take 1 tablet by mouth daily. 05/24/13  Yes Historical Provider, MD  omega-3 acid ethyl esters (LOVAZA) 1 G capsule TAKE 2 CAPSULES BY MOUTH TWICE A DAY 06/22/14  Yes Yvonne R Lowne, DO  potassium citrate (UROCIT-K) 10 MEQ (1080 MG) SR tablet Take 2 tablets by mouth daily. 05/21/13  Yes Historical Provider, MD  valsartan (DIOVAN) 320 MG tablet TAKE ONE (1) TABLET EACH DAY 12/15/14  Yes Yvonne R Lowne, DO  ZETIA 10 MG tablet TAKE ONE (1) TABLET BY MOUTH EVERY DAY 11/18/14  Yes Yvonne R Lowne, DO   BP 133/72 mmHg  Pulse 70  Temp(Src) 98.3 F (36.8 C) (Oral)  Resp 20  Ht 5' 4"  (1.626 m)  Wt 204 lb (92.534 kg)  BMI 35.00 kg/m2  SpO2 98% Physical Exam  Constitutional: She appears well-developed and well-nourished. No distress.  HENT:  Head: Normocephalic and atraumatic.  Mouth/Throat: Oropharynx is clear and moist.  Eyes: Conjunctivae and EOM are normal. Right eye exhibits no discharge. Left eye exhibits no discharge.  Cardiovascular: Normal rate, regular rhythm and normal heart sounds.   Pulmonary/Chest: Effort normal and breath sounds normal. No respiratory distress. She has no wheezes.  Abdominal: Soft. Bowel sounds are normal. She exhibits no distension.  Left lower quadrant tenderness without rebound, rigidity, guarding. No CVA tenderness or back tenderness.  Neurological: She is alert. She exhibits normal muscle tone. Coordination normal.  Skin: Skin is warm and dry. She is not diaphoretic.  Nursing note and vitals reviewed.   ED Course  Procedures (including critical care time) Labs Review Labs Reviewed  URINALYSIS, ROUTINE W REFLEX MICROSCOPIC - Abnormal; Notable for the following:    Hgb urine  dipstick MODERATE (*)    Leukocytes, UA TRACE (*)    All other components within normal limits  URINE MICROSCOPIC-ADD ON - Abnormal; Notable for the following:    Bacteria, UA FEW (*)    All other components within normal limits  CBC WITH DIFFERENTIAL - Abnormal; Notable for the following:    WBC 13.2 (*)    Neutrophils Relative % 81 (*)    Neutro Abs 10.8 (*)    Lymphocytes Relative 11 (*)    All other components within normal limits  I-STAT CHEM 8, ED - Abnormal; Notable for the following:    Glucose, Bld 106 (*)    Calcium, Ion 1.11 (*)    All other components within normal limits  PREGNANCY, URINE  COMPREHENSIVE METABOLIC PANEL  LIPASE, BLOOD    Imaging Review Ct Abdomen Pelvis W Contrast  01/05/2015   CLINICAL DATA:  Left lower quadrant pain  EXAM: CT ABDOMEN AND PELVIS WITH CONTRAST  TECHNIQUE: Multidetector CT imaging of the abdomen and pelvis was performed using the standard protocol following bolus administration of intravenous contrast.  CONTRAST:  77m OMNIPAQUE IOHEXOL 300 MG/ML SOLN, 1054mOMNIPAQUE IOHEXOL 300 MG/ML SOLN  COMPARISON:  05/24/2012  FINDINGS: The lung bases are clear.  The liver demonstrates no focal abnormality. There is no intrahepatic or extrahepatic biliary ductal dilatation. The gallbladder is normal. The spleen demonstrates no focal abnormality. The adrenal glands and pancreas are normal. The bladder is unremarkable.  There are bilateral nephrolithiasis. The right inferior pole renal calculus measures 11 mm. The inferior pole left renal calculus measures 13 mm. There is no ureteral calculus. There is no bladder calculus. There is an extrarenal pelvis bilaterally.  The stomach, duodenum, small intestine, and large intestine demonstrate no contrast extravasation or dilatation. There is a small fat containing umbilical hernia. There is no pneumoperitoneum, pneumatosis, or portal venous gas. There is no abdominal or pelvic free fluid. There is no lymphadenopathy.  There is a small amount of fluid in the endometrial cavity.  The abdominal aorta is normal in caliber.  There are no lytic or sclerotic osseous lesions. There are mild degenerative changes of bilateral SI joints.  IMPRESSION: 1. Bilateral nephrolithiasis without obstruction.   Electronically Signed   By: HeKathreen Devoid On: 01/05/2015 13:26     EKG Interpretation None      MDM   Final diagnoses:  LLQ abdominal pain  Nausea   Patient with medical history of nephrolithiasis as well as diverticulosis with history of intermittent left lower quadrant abdominal pain with associated nausea. Patient currently asymptomatic. She's been given IV fluids with improvement of her symptoms. She has had this before. VSS. Mild left lower quadrant abdominal tenderness without evidence of peritonitis. Laceration. CMP not currently working. Patient with normal lab values on 12/15/2014. I feel patient can follow-up with her primary care provider for reevaluation of labs. CT without acute abdominal process. Patient with bilateral nephrolithiasis without obstruction. Stones are in the kidneys I doubt they're  contributing to her pain. Patient found to have blood in her urine. She is to follow-up with her primary care provider for reevaluation. Patient is afebrile, nontoxic, and in no acute distress. Patient is appropriate for outpatient management and is stable for discharge.  Discussed return precautions with patient. Discussed all results and patient verbalizes understanding and agrees with plan.  Case has been discussed with Dr. Maryan Rued who agrees with the above plan and to discharge.      Pura Spice, PA-C 01/05/15 1418  Blanchie Dessert, MD 01/05/15 [phone]

## 2015-01-05 NOTE — ED Notes (Signed)
Patient states she has a two day history of LLQ abdominal pain.  Describes the pain as intermittent with a gradual onset that worsens.  States when the intensity of the pain is greatest, she feels nausea.

## 2015-01-05 NOTE — ED Notes (Signed)
Patient transported to CT 

## 2015-02-17 ENCOUNTER — Other Ambulatory Visit: Payer: Self-pay | Admitting: Family Medicine

## 2015-04-21 ENCOUNTER — Other Ambulatory Visit: Payer: Self-pay | Admitting: Family Medicine

## 2015-04-28 ENCOUNTER — Other Ambulatory Visit: Payer: Self-pay | Admitting: Obstetrics & Gynecology

## 2015-04-28 DIAGNOSIS — N6320 Unspecified lump in the left breast, unspecified quadrant: Secondary | ICD-10-CM

## 2015-05-05 DIAGNOSIS — N3000 Acute cystitis without hematuria: Secondary | ICD-10-CM | POA: Insufficient documentation

## 2015-05-05 DIAGNOSIS — N200 Calculus of kidney: Secondary | ICD-10-CM | POA: Insufficient documentation

## 2015-05-31 ENCOUNTER — Ambulatory Visit
Admission: RE | Admit: 2015-05-31 | Discharge: 2015-05-31 | Disposition: A | Discharge status: Home / Self Care | Payer: BLUE CROSS/BLUE SHIELD | Source: Ambulatory Visit | Attending: Obstetrics & Gynecology | Admitting: Obstetrics & Gynecology

## 2015-05-31 DIAGNOSIS — N6320 Unspecified lump in the left breast, unspecified quadrant: Secondary | ICD-10-CM

## 2015-06-11 ENCOUNTER — Other Ambulatory Visit: Payer: Self-pay | Admitting: Family Medicine

## 2015-06-25 ENCOUNTER — Other Ambulatory Visit: Payer: Self-pay | Admitting: Family Medicine

## 2015-08-03 ENCOUNTER — Other Ambulatory Visit: Payer: Self-pay | Admitting: Family Medicine

## 2015-08-03 NOTE — Telephone Encounter (Signed)
Zetia refill sent to pharmacy.  Pt was due for 6 month f/u with Dr Etter Sjogren in June and is past due.  Please call pt to arrange fasting appt.  Thanks!

## 2015-08-05 ENCOUNTER — Telehealth: Payer: Self-pay | Admitting: Family Medicine

## 2015-08-05 DIAGNOSIS — E785 Hyperlipidemia, unspecified: Secondary | ICD-10-CM

## 2015-08-05 NOTE — Telephone Encounter (Signed)
Orders are in, please schedule.     KP

## 2015-08-05 NOTE — Telephone Encounter (Signed)
appt scheduled

## 2015-08-05 NOTE — Telephone Encounter (Signed)
°  Relation to XY:OFVW Call back number:[phone] Pharmacy:  Reason for call: pt states that she was due to have blood work done in June, however there was no orders in the system for her,

## 2015-08-06 ENCOUNTER — Other Ambulatory Visit (INDEPENDENT_AMBULATORY_CARE_PROVIDER_SITE_OTHER): Payer: BLUE CROSS/BLUE SHIELD

## 2015-08-06 DIAGNOSIS — E785 Hyperlipidemia, unspecified: Secondary | ICD-10-CM | POA: Diagnosis not present

## 2015-08-06 LAB — LIPID PANEL
Cholesterol: 202 mg/dL — ABNORMAL HIGH (ref 0–200)
HDL: 65.6 mg/dL (ref >39.00)
LDL Cholesterol: 124 mg/dL — ABNORMAL HIGH (ref 0–99)
NonHDL: 136.77
Total CHOL/HDL Ratio: 3
Triglycerides: 63 mg/dL (ref 0.0–149.0)
VLDL: 12.6 mg/dL (ref 0.0–40.0)

## 2015-08-06 LAB — HEPATIC FUNCTION PANEL
ALT: 19 U/L (ref 0–35)
AST: 17 U/L (ref 0–37)
Albumin: 4.2 g/dL (ref 3.5–5.2)
Alkaline Phosphatase: 90 U/L (ref 39–117)
Bilirubin, Direct: 0.2 mg/dL (ref 0.0–0.3)
Total Bilirubin: 0.8 mg/dL (ref 0.2–1.2)
Total Protein: 7.2 g/dL (ref 6.0–8.3)

## 2015-08-06 LAB — BASIC METABOLIC PANEL
BUN: 18 mg/dL (ref 6–23)
CO2: 27 mEq/L (ref 19–32)
Calcium: 9.2 mg/dL (ref 8.4–10.5)
Chloride: 103 mEq/L (ref 96–112)
Creatinine, Ser: 0.66 mg/dL (ref 0.40–1.20)
GFR: 101.23 mL/min (ref >60.00)
Glucose, Bld: 101 mg/dL — ABNORMAL HIGH (ref 70–99)
Potassium: 4 mEq/L (ref 3.5–5.1)
Sodium: 138 mEq/L (ref 135–145)

## 2015-08-06 NOTE — Telephone Encounter (Signed)
Patient scheduled follow up for 08/19/2015

## 2015-08-19 ENCOUNTER — Ambulatory Visit (INDEPENDENT_AMBULATORY_CARE_PROVIDER_SITE_OTHER): Payer: BLUE CROSS/BLUE SHIELD | Admitting: Family Medicine

## 2015-08-19 ENCOUNTER — Encounter: Payer: Self-pay | Admitting: Family Medicine

## 2015-08-19 VITALS — BP 108/76 | HR 62 | Temp 98.2°F | Wt 194.6 lb

## 2015-08-19 DIAGNOSIS — E785 Hyperlipidemia, unspecified: Secondary | ICD-10-CM | POA: Diagnosis not present

## 2015-08-19 DIAGNOSIS — F411 Generalized anxiety disorder: Secondary | ICD-10-CM

## 2015-08-19 DIAGNOSIS — I10 Essential (primary) hypertension: Secondary | ICD-10-CM | POA: Diagnosis not present

## 2015-08-19 MED ORDER — ALPRAZOLAM 0.5 MG PO TABS: 0.5000 mg | ORAL_TABLET | ORAL | Status: DC | PRN

## 2015-08-19 NOTE — Assessment & Plan Note (Signed)
con't prozac and xanax

## 2015-08-19 NOTE — Patient Instructions (Signed)

## 2015-08-19 NOTE — Progress Notes (Signed)
Pre visit review using our clinic review tool, if applicable. No additional management support is needed unless otherwise documented below in the visit note. 

## 2015-08-19 NOTE — Progress Notes (Addendum)
Patient ID: Lindsey [name], female   DOB: [birth year], 49 y.o.   MRN: [id number]   Subjective:    Patient ID: Lindsey [name], female    DOB: 01/21/1966, 49 y.o.   MRN: [id number]  Chief Complaint  Patient presents with  . Hypertension    f/u-- Not Fasting    HPI Patient is in today for f/u bp and cholesterol.  No complaints.  Past Medical History  Diagnosis Date  . Hyperlipidemia   . Hypertension     Past Surgical History  Procedure Laterality Date  . Leep    . Lithotripsy      Family History  Problem Relation Age of Onset  . Dementia Father   . Alzheimer's disease Father   . Hyperlipidemia    . Hypertension    . Diabetes      Social History   Social History  . Marital Status: Married    Spouse Name: N/A  . Number of Children: N/A  . Years of Education: N/A   Occupational History  . Not on file.   Social History Main Topics  . Smoking status: Former Research scientist (life sciences)  . Smokeless tobacco: Not on file  . Alcohol Use: No  . Drug Use: No  . Sexual Activity: Yes   Other Topics Concern  . Not on file   Social History Narrative   Occupation: Babysit   Married   Regular exercise- yes    Outpatient Prescriptions Prior to Visit  Medication Sig Dispense Refill  . cetirizine (ZYRTEC) 10 MG tablet Take 10 mg by mouth daily.      . Cyanocobalamin (VITAMIN B 12 PO) Take by mouth.      Marland Kitchen FLUoxetine (PROZAC) 20 MG capsule TAKE ONE CAPSULE BY MOUTH DAILY 30 capsule 2  . montelukast (SINGULAIR) 10 MG tablet Take 1 tablet by mouth daily.    Marland Kitchen omega-3 acid ethyl esters (LOVAZA) 1 G capsule TAKE 2 CAPSULES BY MOUTH TWICE A DAY 120 capsule 5  . potassium citrate (UROCIT-K) 10 MEQ (1080 MG) SR tablet Take 2 tablets by mouth daily.    . valsartan (DIOVAN) 320 MG tablet TAKE ONE (1) TABLET EACH DAY 30 tablet 2  . ZETIA 10 MG tablet TAKE ONE (1) TABLET BY MOUTH EVERY DAY 30 tablet 1  . amLODipine (NORVASC) 5 MG tablet Take 1.5 tablets by mouth daily 135 tablet 1   No  facility-administered medications prior to visit.    Allergies  Allergen Reactions  . Macrobid [Nitrofurantoin Macrocrystal] Other (See Comments)    abd pain    Review of Systems  Constitutional: Positive for malaise/fatigue. Negative for fever.  HENT: Negative for congestion.   Eyes: Negative for discharge.  Respiratory: Negative for shortness of breath.   Cardiovascular: Negative for chest pain, palpitations and leg swelling.  Gastrointestinal: Negative for nausea and abdominal pain.  Genitourinary: Negative for dysuria.  Musculoskeletal: Negative for falls.  Skin: Negative for rash.  Neurological: Negative for loss of consciousness and headaches.  Endo/Heme/Allergies: Negative for environmental allergies.  Psychiatric/Behavioral: Negative for depression. The patient is not nervous/anxious.        Objective:    Physical Exam  Constitutional: She is oriented to person, place, and time. She appears well-developed and well-nourished.  HENT:  Head: Normocephalic and atraumatic.  Eyes: Conjunctivae and EOM are normal.  Neck: Normal range of motion. Neck supple. No JVD present. Carotid bruit is not present. No thyromegaly present.  Cardiovascular: Normal rate, regular rhythm and normal heart sounds.  No murmur heard. Pulmonary/Chest: Effort normal and breath sounds normal. No respiratory distress. She has no wheezes. She has no rales. She exhibits no tenderness.  Musculoskeletal: She exhibits no edema.  Neurological: She is alert and oriented to person, place, and time.  Psychiatric: She has a normal mood and affect. Her behavior is normal. Judgment and thought content normal.    BP 108/76 mmHg  Pulse 62  Temp(Src) 98.2 F (36.8 C) (Oral)  Wt 194 lb 9.6 oz (88.27 kg)  SpO2 98% Wt Readings from Last 3 Encounters:  09/07/15 196 lb (88.905 kg)  08/19/15 194 lb 9.6 oz (88.27 kg)  01/05/15 204 lb (92.534 kg)     Lab Results  Component Value Date   WBC 13.2* 01/05/2015     HGB 13.6 01/05/2015   HCT 42.0 01/05/2015   PLT 233 01/05/2015   GLUCOSE 101* 08/06/2015   CHOL 202* 08/06/2015   TRIG 63.0 08/06/2015   HDL 65.60 08/06/2015   LDLDIRECT 123.2 12/20/2011   LDLCALC 124* 08/06/2015   ALT 19 08/06/2015   AST 17 08/06/2015   NA 138 08/06/2015   K 4.0 08/06/2015   CL 103 08/06/2015   CREATININE 0.66 08/06/2015   BUN 18 08/06/2015   CO2 27 08/06/2015   TSH 1.22 05/14/2014   HGBA1C 5.9 12/20/2011    Lab Results  Component Value Date   TSH 1.22 05/14/2014   Lab Results  Component Value Date   WBC 13.2* 01/05/2015   HGB 13.6 01/05/2015   HCT 42.0 01/05/2015   MCV 85.9 01/05/2015   PLT 233 01/05/2015   Lab Results  Component Value Date   NA 138 08/06/2015   K 4.0 08/06/2015   CO2 27 08/06/2015   GLUCOSE 101* 08/06/2015   BUN 18 08/06/2015   CREATININE 0.66 08/06/2015   BILITOT 0.8 08/06/2015   ALKPHOS 90 08/06/2015   AST 17 08/06/2015   ALT 19 08/06/2015   PROT 7.2 08/06/2015   ALBUMIN 4.2 08/06/2015   CALCIUM 9.2 08/06/2015   ANIONGAP 6 01/05/2015   GFR 101.23 08/06/2015   Lab Results  Component Value Date   CHOL 202* 08/06/2015   Lab Results  Component Value Date   HDL 65.60 08/06/2015   Lab Results  Component Value Date   LDLCALC 124* 08/06/2015   Lab Results  Component Value Date   TRIG 63.0 08/06/2015   Lab Results  Component Value Date   CHOLHDL 3 08/06/2015   Lab Results  Component Value Date   HGBA1C 5.9 12/20/2011       Assessment & Plan:   Problem List Items Addressed This Visit      Unprioritized   Hyperlipidemia - Primary    con't zetia Check labs Lab Results  Component Value Date   CHOL 202* 08/06/2015   HDL 65.60 08/06/2015   LDLCALC 124* 08/06/2015   LDLDIRECT 123.2 12/20/2011   TRIG 63.0 08/06/2015   CHOLHDL 3 08/06/2015         Essential hypertension    con't diovan stable      Anxiety state    con't prozac and xanax      Relevant Medications   ALPRAZolam (XANAX) 0.5  MG tablet      I have discontinued Lindsey [name]'s amLODipine. I am also having her start on ALPRAZolam. Additionally, I am having her maintain her Cyanocobalamin (VITAMIN B 12 PO), cetirizine, montelukast, potassium citrate, omega-3 acid ethyl esters, FLUoxetine, valsartan, and ZETIA.  Meds ordered this encounter  Medications  .  ALPRAZolam (XANAX) 0.5 MG tablet    Sig: Take 1 tablet (0.5 mg total) by mouth as needed for anxiety.    Dispense:  10 tablet    Refill:  0     Garnet Koyanagi, DO

## 2015-08-19 NOTE — Assessment & Plan Note (Signed)
con't zetia Check labs Lab Results  Component Value Date   CHOL 202* 08/06/2015   HDL 65.60 08/06/2015   LDLCALC 124* 08/06/2015   LDLDIRECT 123.2 12/20/2011   TRIG 63.0 08/06/2015   CHOLHDL 3 08/06/2015

## 2015-09-07 ENCOUNTER — Other Ambulatory Visit: Payer: Self-pay | Admitting: Family Medicine

## 2015-09-07 ENCOUNTER — Ambulatory Visit (INDEPENDENT_AMBULATORY_CARE_PROVIDER_SITE_OTHER): Payer: BLUE CROSS/BLUE SHIELD | Admitting: Family Medicine

## 2015-09-07 ENCOUNTER — Encounter: Payer: Self-pay | Admitting: Family Medicine

## 2015-09-07 VITALS — BP 120/58 | HR 82 | Temp 98.0°F | Ht 64.0 in | Wt 196.0 lb

## 2015-09-07 DIAGNOSIS — Z23 Encounter for immunization: Secondary | ICD-10-CM

## 2015-09-07 DIAGNOSIS — I10 Essential (primary) hypertension: Secondary | ICD-10-CM

## 2015-09-07 NOTE — Assessment & Plan Note (Signed)
bp doing well off norvasc con't diovan-- recheck 3 months or sooner prn

## 2015-09-07 NOTE — Patient Instructions (Signed)

## 2015-09-07 NOTE — Progress Notes (Signed)
Pre visit review using our clinic review tool, if applicable. No additional management support is needed unless otherwise documented below in the visit note. 

## 2015-09-07 NOTE — Progress Notes (Signed)
Patient ID: Lindsey [name], female    DOB: January 14, 1966  Age: 49 y.o. MRN: [id number]    Subjective:  Subjective HPI [name] presents for f/u bp-- no cp, sob, doing well off norvasc.    Review of Systems  Constitutional: Negative for diaphoresis, appetite change, fatigue and unexpected weight change.  Eyes: Negative for pain, redness and visual disturbance.  Respiratory: Negative for cough, chest tightness, shortness of breath and wheezing.   Cardiovascular: Negative for chest pain, palpitations and leg swelling.  Endocrine: Negative for cold intolerance, heat intolerance, polydipsia, polyphagia and polyuria.  Genitourinary: Negative for dysuria, frequency and difficulty urinating.  Neurological: Negative for dizziness, light-headedness, numbness and headaches.    History Past Medical History  Diagnosis Date  . Hyperlipidemia   . Hypertension     She has past surgical history that includes LEEP and Lithotripsy.   Her family history includes Alzheimer's disease in her father; Dementia in her father; Diabetes in an other family member; Hyperlipidemia in an other family member; Hypertension in an other family member.She reports that she has quit smoking. She does not have any smokeless tobacco history on file. She reports that she does not drink alcohol or use illicit drugs.  Current Outpatient Prescriptions on File Prior to Visit  Medication Sig Dispense Refill  . ALPRAZolam (XANAX) 0.5 MG tablet Take 1 tablet (0.5 mg total) by mouth as needed for anxiety. 10 tablet 0  . cetirizine (ZYRTEC) 10 MG tablet Take 10 mg by mouth daily.      . Cyanocobalamin (VITAMIN B 12 PO) Take by mouth.      Marland Kitchen FLUoxetine (PROZAC) 20 MG capsule TAKE ONE CAPSULE BY MOUTH DAILY 30 capsule 2  . montelukast (SINGULAIR) 10 MG tablet Take 1 tablet by mouth daily.    Marland Kitchen omega-3 acid ethyl esters (LOVAZA) 1 G capsule TAKE 2 CAPSULES BY MOUTH TWICE A DAY 120 capsule 5  . potassium citrate (UROCIT-K) 10  MEQ (1080 MG) SR tablet Take 2 tablets by mouth daily.    . valsartan (DIOVAN) 320 MG tablet TAKE ONE (1) TABLET EACH DAY 30 tablet 2  . ZETIA 10 MG tablet TAKE ONE (1) TABLET BY MOUTH EVERY DAY 30 tablet 1   No current facility-administered medications on file prior to visit.     Objective:  Objective Physical Exam  Constitutional: She is oriented to person, place, and time. She appears well-developed and well-nourished.  HENT:  Head: Normocephalic and atraumatic.  Eyes: Conjunctivae and EOM are normal.  Neck: Normal range of motion. Neck supple. No JVD present. Carotid bruit is not present. No thyromegaly present.  Cardiovascular: Normal rate, regular rhythm and normal heart sounds.   No murmur heard. Pulmonary/Chest: Effort normal and breath sounds normal. No respiratory distress. She has no wheezes. She has no rales. She exhibits no tenderness.  Musculoskeletal: She exhibits no edema.  Neurological: She is alert and oriented to person, place, and time.  Psychiatric: She has a normal mood and affect. Her behavior is normal. Judgment and thought content normal.   BP 120/58 mmHg  Pulse 82  Temp(Src) 98 F (36.7 C) (Oral)  Ht 5' 4"  (1.626 m)  Wt 196 lb (88.905 kg)  BMI 33.63 kg/m2  SpO2 98% Wt Readings from Last 3 Encounters:  09/07/15 196 lb (88.905 kg)  08/19/15 194 lb 9.6 oz (88.27 kg)  01/05/15 204 lb (92.534 kg)     Lab Results  Component Value Date   WBC 13.2* 01/05/2015   HGB 13.6  01/05/2015   HCT 42.0 01/05/2015   PLT 233 01/05/2015   GLUCOSE 101* 08/06/2015   CHOL 202* 08/06/2015   TRIG 63.0 08/06/2015   HDL 65.60 08/06/2015   LDLDIRECT 123.2 12/20/2011   LDLCALC 124* 08/06/2015   ALT 19 08/06/2015   AST 17 08/06/2015   NA 138 08/06/2015   K 4.0 08/06/2015   CL 103 08/06/2015   CREATININE 0.66 08/06/2015   BUN 18 08/06/2015   CO2 27 08/06/2015   TSH 1.22 05/14/2014   HGBA1C 5.9 12/20/2011    US Breast [place]  05/31/2015   CLINICAL  DATA:  One year interval followup of likely benign mass in the upper inner left breast, middle to posterior depth. Annual evaluation, right breast.  EXAM: DIGITAL DIAGNOSTIC BILATERAL MAMMOGRAM WITH 3D TOMOSYNTHESIS WITH CAD  ULTRASOUND LEFT BREAST  COMPARISON:  Mammography 05/25/2014 and earlier. Left breast ultrasound 06/17/2014.  ACR Breast Density Category b: There are scattered areas of fibroglandular density.  FINDINGS: CC and MLO views of both breasts with tomosynthesis were obtained. The circumscribed mass with a possible fatty hilus in the upper inner left breast, middle to posterior depth, is unchanged from the mammogram with tomosynthesis 1 year ago, measuring approximately 8 mm. No new or suspicious findings elsewhere in the left breast.  No findings suspicious for malignancy in the right breast.  Mammographic images were processed with CAD.  On physical exam, there is no palpable abnormality in the upper inner left breast. The tissues of the upper inner left breast have a "lumpy-bumpy" texture.  Targeted left breast ultrasound is performed, showing the circumscribed parallel hypoechoic mass with slight acoustic enhancement at the 11 o'clock position approximately 7 cm from the nipple measuring approximately 5 x 4 x 6 mm, unchanged from the prior ultrasound. There is no internal power Doppler flow. No new suspicious solid mass or abnormal acoustic shadowing is identified.  IMPRESSION: 1. Stable likely benign 6 mm complex cyst in the upper inner left breast dating back to June, 2016. 2. No new or suspicious findings elsewhere in the left breast. 3. No mammographic evidence of malignancy, right breast.  RECOMMENDATION: Left breast ultrasound at the time of bilateral diagnostic mammography in 1 year.  I have discussed the findings and recommendations with the patient. Results were also provided in writing at the conclusion of the visit. If applicable, a reminder letter will be sent to the patient regarding  the next appointment.  BI-RADS CATEGORY  3: Probably benign.   Electronically Signed   By: Evangeline Dakin M.D.   On: 05/31/2015 12:32   Mm Diag Breast Tomo Bilateral  05/31/2015   CLINICAL DATA:  One year interval followup of likely benign mass in the upper inner left breast, middle to posterior depth. Annual evaluation, right breast.  EXAM: DIGITAL DIAGNOSTIC BILATERAL MAMMOGRAM WITH 3D TOMOSYNTHESIS WITH CAD  ULTRASOUND LEFT BREAST  COMPARISON:  Mammography 05/25/2014 and earlier. Left breast ultrasound 06/17/2014.  ACR Breast Density Category b: There are scattered areas of fibroglandular density.  FINDINGS: CC and MLO views of both breasts with tomosynthesis were obtained. The circumscribed mass with a possible fatty hilus in the upper inner left breast, middle to posterior depth, is unchanged from the mammogram with tomosynthesis 1 year ago, measuring approximately 8 mm. No new or suspicious findings elsewhere in the left breast.  No findings suspicious for malignancy in the right breast.  Mammographic images were processed with CAD.  On physical exam, there is no palpable  abnormality in the upper inner left breast. The tissues of the upper inner left breast have a "lumpy-bumpy" texture.  Targeted left breast ultrasound is performed, showing the circumscribed parallel hypoechoic mass with slight acoustic enhancement at the 11 o'clock position approximately 7 cm from the nipple measuring approximately 5 x 4 x 6 mm, unchanged from the prior ultrasound. There is no internal power Doppler flow. No new suspicious solid mass or abnormal acoustic shadowing is identified.  IMPRESSION: 1. Stable likely benign 6 mm complex cyst in the upper inner left breast dating back to June, 2016. 2. No new or suspicious findings elsewhere in the left breast. 3. No mammographic evidence of malignancy, right breast.  RECOMMENDATION: Left breast ultrasound at the time of bilateral diagnostic mammography in 1 year.  I have discussed  the findings and recommendations with the patient. Results were also provided in writing at the conclusion of the visit. If applicable, a reminder letter will be sent to the patient regarding the next appointment.  BI-RADS CATEGORY  3: Probably benign.   Electronically Signed   By: Evangeline Dakin M.D.   On: 05/31/2015 12:32     Assessment & Plan:  Plan I am having Ms. Pascucci maintain her Cyanocobalamin (VITAMIN B 12 PO), cetirizine, montelukast, potassium citrate, omega-3 acid ethyl esters, FLUoxetine, valsartan, ZETIA, and ALPRAZolam.  No orders of the defined types were placed in this encounter.    Problem List Items Addressed This Visit      Unprioritized   Essential hypertension - Primary    bp doing well off norvasc con't diovan-- recheck 3 months or sooner prn       Other Visit Diagnoses    Encounter for immunization         flu shot given  Follow-up: Return if symptoms worsen or fail to improve.  Garnet Koyanagi, DO

## 2015-09-07 NOTE — Assessment & Plan Note (Signed)
con't diovan stable

## 2015-10-19 ENCOUNTER — Other Ambulatory Visit: Payer: Self-pay | Admitting: Family Medicine

## 2015-11-12 ENCOUNTER — Ambulatory Visit (INDEPENDENT_AMBULATORY_CARE_PROVIDER_SITE_OTHER): Payer: BLUE CROSS/BLUE SHIELD | Admitting: Family Medicine

## 2015-11-12 ENCOUNTER — Encounter: Payer: Self-pay | Admitting: Family Medicine

## 2015-11-12 VITALS — BP 112/70 | HR 72 | Temp 98.8°F | Wt 197.4 lb

## 2015-11-12 DIAGNOSIS — J4520 Mild intermittent asthma, uncomplicated: Secondary | ICD-10-CM | POA: Diagnosis not present

## 2015-11-12 DIAGNOSIS — E785 Hyperlipidemia, unspecified: Secondary | ICD-10-CM

## 2015-11-12 DIAGNOSIS — J208 Acute bronchitis due to other specified organisms: Secondary | ICD-10-CM

## 2015-11-12 LAB — COMPREHENSIVE METABOLIC PANEL
ALT: 19 U/L (ref 6–29)
AST: 27 U/L (ref 10–35)
Albumin: 4 g/dL (ref 3.6–5.1)
Alkaline Phosphatase: 76 U/L (ref 33–115)
BUN: 19 mg/dL (ref 7–25)
CO2: 21 mmol/L (ref 20–31)
Calcium: 9.2 mg/dL (ref 8.6–10.2)
Chloride: 104 mmol/L (ref 98–110)
Creat: 0.66 mg/dL (ref 0.50–1.10)
Glucose, Bld: 87 mg/dL (ref 65–99)
Potassium: 4.4 mmol/L (ref 3.5–5.3)
Sodium: 137 mmol/L (ref 135–146)
Total Bilirubin: 0.4 mg/dL (ref 0.2–1.2)
Total Protein: 6.8 g/dL (ref 6.1–8.1)

## 2015-11-12 LAB — LIPID PANEL
Cholesterol: 189 mg/dL (ref 125–200)
HDL: 53 mg/dL (ref >=46)
LDL Cholesterol: 105 mg/dL (ref <130)
Total CHOL/HDL Ratio: 3.6 Ratio (ref <=5.0)
Triglycerides: 153 mg/dL — ABNORMAL HIGH (ref <150)
VLDL: 31 mg/dL — ABNORMAL HIGH (ref <30)

## 2015-11-12 MED ORDER — AZITHROMYCIN 250 MG PO TABS: ORAL_TABLET | ORAL | Status: DC

## 2015-11-12 MED ORDER — ALBUTEROL SULFATE HFA 108 (90 BASE) MCG/ACT IN AERS: 2.0000 | INHALATION_SPRAY | Freq: Four times a day (QID) | RESPIRATORY_TRACT | Status: DC | PRN

## 2015-11-12 NOTE — Progress Notes (Signed)
Pre visit review using our clinic review tool, if applicable. No additional management support is needed unless otherwise documented below in the visit note. 

## 2015-11-12 NOTE — Progress Notes (Signed)
Patient ID: Lindsey [name], female    DOB: Nov 22, 1966  Age: 49 y.o. MRN: [id number]    Subjective:  Subjective HPI Lindsey [name] presents for c/o uri.  + runny nose, congestion, sinus pressure has improved.  + cough, productive.  No fever.    Review of Systems  Constitutional: Negative for fever and chills.  HENT: Positive for congestion, postnasal drip, rhinorrhea and sinus pressure.   Respiratory: Positive for cough, chest tightness, shortness of breath and wheezing.   Cardiovascular: Negative for chest pain, palpitations and leg swelling.  Allergic/Immunologic: Negative for environmental allergies.    History Past Medical History  Diagnosis Date  . Hyperlipidemia   . Hypertension     She has past surgical history that includes LEEP and Lithotripsy.   Her family history includes Alzheimer's disease in her father; Dementia in her father; Diabetes in an other family member; Hyperlipidemia in an other family member; Hypertension in an other family member.She reports that she has quit smoking. She does not have any smokeless tobacco history on file. She reports that she does not drink alcohol or use illicit drugs.  Current Outpatient Prescriptions on File Prior to Visit  Medication Sig Dispense Refill  . ALPRAZolam (XANAX) 0.5 MG tablet Take 1 tablet (0.5 mg total) by mouth as needed for anxiety. 10 tablet 0  . cetirizine (ZYRTEC) 10 MG tablet Take 10 mg by mouth daily.      . Cyanocobalamin (VITAMIN B 12 PO) Take by mouth.      Marland Kitchen FLUoxetine (PROZAC) 20 MG capsule TAKE ONE CAPSULE BY MOUTH DAILY 30 capsule 5  . montelukast (SINGULAIR) 10 MG tablet Take 1 tablet by mouth daily.    Marland Kitchen omega-3 acid ethyl esters (LOVAZA) 1 G capsule TAKE 2 CAPSULES BY MOUTH TWICE A DAY 120 capsule 5  . potassium citrate (UROCIT-K) 10 MEQ (1080 MG) SR tablet Take 2 tablets by mouth daily.    . valsartan (DIOVAN) 320 MG tablet TAKE ONE (1) TABLET BY MOUTH EVERY DAY 30 tablet 5  . ZETIA 10 MG tablet  TAKE ONE (1) TABLET BY MOUTH EVERY DAY 30 tablet 5   No current facility-administered medications on file prior to visit.     Objective:  Objective Physical Exam  Constitutional: She is oriented to person, place, and time. She appears well-developed and well-nourished.  HENT:  Right Ear: External ear normal.  Left Ear: External ear normal.  + PND + errythema  Eyes: Conjunctivae are normal. Right eye exhibits no discharge. Left eye exhibits no discharge.  Cardiovascular: Normal rate, regular rhythm and normal heart sounds.   No murmur heard. Pulmonary/Chest: Effort normal and breath sounds normal. No respiratory distress. She has no wheezes. She has no rales. She exhibits no tenderness.  Musculoskeletal: She exhibits no edema.  Lymphadenopathy:    She has cervical adenopathy.  Neurological: She is alert and oriented to person, place, and time.  Psychiatric: She has a normal mood and affect. Her behavior is normal. Judgment and thought content normal.  Nursing note and vitals reviewed.  BP 112/70 mmHg  Pulse 72  Temp(Src) 98.8 F (37.1 C) (Oral)  Wt 197 lb 6.4 oz (89.54 kg)  SpO2 98% Wt Readings from Last 3 Encounters:  11/12/15 197 lb 6.4 oz (89.54 kg)  09/07/15 196 lb (88.905 kg)  08/19/15 194 lb 9.6 oz (88.27 kg)     Lab Results  Component Value Date   WBC 13.2* 01/05/2015   HGB 13.6 01/05/2015   HCT 42.0 01/05/2015  PLT 233 01/05/2015   GLUCOSE 101* 08/06/2015   CHOL 202* 08/06/2015   TRIG 63.0 08/06/2015   HDL 65.60 08/06/2015   LDLDIRECT 123.2 12/20/2011   LDLCALC 124* 08/06/2015   ALT 19 08/06/2015   AST 17 08/06/2015   NA 138 08/06/2015   K 4.0 08/06/2015   CL 103 08/06/2015   CREATININE 0.66 08/06/2015   BUN 18 08/06/2015   CO2 27 08/06/2015   TSH 1.22 05/14/2014   HGBA1C 5.9 12/20/2011    US Breast [place]  05/31/2015  CLINICAL DATA:  One year interval followup of likely benign mass in the upper inner left breast, middle to  posterior depth. Annual evaluation, right breast. EXAM: DIGITAL DIAGNOSTIC BILATERAL MAMMOGRAM WITH 3D TOMOSYNTHESIS WITH CAD ULTRASOUND LEFT BREAST COMPARISON:  Mammography 05/25/2014 and earlier. Left breast ultrasound 06/17/2014. ACR Breast Density Category b: There are scattered areas of fibroglandular density. FINDINGS: CC and MLO views of both breasts with tomosynthesis were obtained. The circumscribed mass with a possible fatty hilus in the upper inner left breast, middle to posterior depth, is unchanged from the mammogram with tomosynthesis 1 year ago, measuring approximately 8 mm. No new or suspicious findings elsewhere in the left breast. No findings suspicious for malignancy in the right breast. Mammographic images were processed with CAD. On physical exam, there is no palpable abnormality in the upper inner left breast. The tissues of the upper inner left breast have a "lumpy-bumpy" texture. Targeted left breast ultrasound is performed, showing the circumscribed parallel hypoechoic mass with slight acoustic enhancement at the 11 o'clock position approximately 7 cm from the nipple measuring approximately 5 x 4 x 6 mm, unchanged from the prior ultrasound. There is no internal power Doppler flow. No new suspicious solid mass or abnormal acoustic shadowing is identified. IMPRESSION: 1. Stable likely benign 6 mm complex cyst in the upper inner left breast dating back to June, 2016. 2. No new or suspicious findings elsewhere in the left breast. 3. No mammographic evidence of malignancy, right breast. RECOMMENDATION: Left breast ultrasound at the time of bilateral diagnostic mammography in 1 year. I have discussed the findings and recommendations with the patient. Results were also provided in writing at the conclusion of the visit. If applicable, a reminder letter will be sent to the patient regarding the next appointment. BI-RADS CATEGORY  3: Probably benign. Electronically Signed   By: Evangeline Dakin M.D.    On: 05/31/2015 12:32   Mm Diag Breast Tomo Bilateral  05/31/2015  CLINICAL DATA:  One year interval followup of likely benign mass in the upper inner left breast, middle to posterior depth. Annual evaluation, right breast. EXAM: DIGITAL DIAGNOSTIC BILATERAL MAMMOGRAM WITH 3D TOMOSYNTHESIS WITH CAD ULTRASOUND LEFT BREAST COMPARISON:  Mammography 05/25/2014 and earlier. Left breast ultrasound 06/17/2014. ACR Breast Density Category b: There are scattered areas of fibroglandular density. FINDINGS: CC and MLO views of both breasts with tomosynthesis were obtained. The circumscribed mass with a possible fatty hilus in the upper inner left breast, middle to posterior depth, is unchanged from the mammogram with tomosynthesis 1 year ago, measuring approximately 8 mm. No new or suspicious findings elsewhere in the left breast. No findings suspicious for malignancy in the right breast. Mammographic images were processed with CAD. On physical exam, there is no palpable abnormality in the upper inner left breast. The tissues of the upper inner left breast have a "lumpy-bumpy" texture. Targeted left breast ultrasound is performed, showing the circumscribed parallel hypoechoic mass with slight  acoustic enhancement at the 11 o'clock position approximately 7 cm from the nipple measuring approximately 5 x 4 x 6 mm, unchanged from the prior ultrasound. There is no internal power Doppler flow. No new suspicious solid mass or abnormal acoustic shadowing is identified. IMPRESSION: 1. Stable likely benign 6 mm complex cyst in the upper inner left breast dating back to June, 2016. 2. No new or suspicious findings elsewhere in the left breast. 3. No mammographic evidence of malignancy, right breast. RECOMMENDATION: Left breast ultrasound at the time of bilateral diagnostic mammography in 1 year. I have discussed the findings and recommendations with the patient. Results were also provided in writing at the conclusion of the visit. If  applicable, a reminder letter will be sent to the patient regarding the next appointment. BI-RADS CATEGORY  3: Probably benign. Electronically Signed   By: Evangeline Dakin M.D.   On: 05/31/2015 12:32     Assessment & Plan:  Plan I am having Lindsey [name] start on albuterol and azithromycin. I am also having her maintain her Cyanocobalamin (VITAMIN B 12 PO), cetirizine, montelukast, potassium citrate, omega-3 acid ethyl esters, ALPRAZolam, FLUoxetine, ZETIA, and valsartan.  Meds ordered this encounter  Medications  . albuterol (PROVENTIL HFA;VENTOLIN HFA) 108 (90 BASE) MCG/ACT inhaler    Sig: Inhale 2 puffs into the lungs every 6 (six) hours as needed for wheezing or shortness of breath.    Dispense:  1 Inhaler    Refill:  2  . azithromycin (ZITHROMAX Z-PAK) 250 MG tablet    Sig: As directed    Dispense:  6 each    Refill:  0    Problem List Items Addressed This Visit    Hyperlipidemia   Relevant Orders   Comp Met (CMET)   Lipid panel    Other Visit Diagnoses    Asthma, mild intermittent, uncomplicated    -  Primary    Relevant Medications    albuterol (PROVENTIL HFA;VENTOLIN HFA) 108 (90 BASE) MCG/ACT inhaler    Acute bronchitis due to other specified organisms        Relevant Medications    azithromycin (ZITHROMAX Z-PAK) 250 MG tablet       Follow-up: Return if symptoms worsen or fail to improve.  Garnet Koyanagi, DO

## 2015-11-12 NOTE — Patient Instructions (Signed)

## 2015-12-28 ENCOUNTER — Other Ambulatory Visit: Payer: Self-pay | Admitting: Allergy

## 2015-12-28 MED ORDER — MONTELUKAST SODIUM 10 MG PO TABS: 10.0000 mg | ORAL_TABLET | Freq: Every day | ORAL | Status: DC

## 2016-03-18 ENCOUNTER — Other Ambulatory Visit: Payer: Self-pay | Admitting: Family Medicine

## 2016-04-20 ENCOUNTER — Other Ambulatory Visit: Payer: Self-pay

## 2016-04-20 MED ORDER — VALSARTAN 320 MG PO TABS: ORAL_TABLET | ORAL | Status: DC

## 2016-04-20 MED ORDER — EZETIMIBE 10 MG PO TABS: ORAL_TABLET | ORAL | Status: DC

## 2016-05-16 ENCOUNTER — Other Ambulatory Visit: Payer: Self-pay

## 2016-05-16 MED ORDER — VALSARTAN 320 MG PO TABS: ORAL_TABLET | ORAL | Status: DC

## 2016-05-17 ENCOUNTER — Other Ambulatory Visit: Payer: Self-pay | Admitting: Emergency Medicine

## 2016-05-17 MED ORDER — EZETIMIBE 10 MG PO TABS: ORAL_TABLET | ORAL | Status: DC

## 2016-05-17 MED ORDER — OMEGA-3-ACID ETHYL ESTERS 1 G PO CAPS: 2.0000 | ORAL_CAPSULE | Freq: Two times a day (BID) | ORAL | Status: DC

## 2016-05-24 ENCOUNTER — Ambulatory Visit (INDEPENDENT_AMBULATORY_CARE_PROVIDER_SITE_OTHER): Payer: BLUE CROSS/BLUE SHIELD | Admitting: Family

## 2016-05-24 ENCOUNTER — Encounter: Payer: Self-pay | Admitting: Family

## 2016-05-24 VITALS — BP 124/60 | HR 91 | Temp 98.4°F | Resp 18 | Ht 64.0 in | Wt 206.4 lb

## 2016-05-24 DIAGNOSIS — R109 Unspecified abdominal pain: Secondary | ICD-10-CM

## 2016-05-24 DIAGNOSIS — R1013 Epigastric pain: Secondary | ICD-10-CM | POA: Diagnosis not present

## 2016-05-24 DIAGNOSIS — R3129 Other microscopic hematuria: Secondary | ICD-10-CM

## 2016-05-24 DIAGNOSIS — R110 Nausea: Secondary | ICD-10-CM

## 2016-05-24 DIAGNOSIS — R10A0 Flank pain, unspecified side: Secondary | ICD-10-CM

## 2016-05-24 LAB — COMPREHENSIVE METABOLIC PANEL
ALT: 17 U/L (ref 0–35)
AST: 15 U/L (ref 0–37)
Albumin: 4.4 g/dL (ref 3.5–5.2)
Alkaline Phosphatase: 61 U/L (ref 39–117)
BUN: 16 mg/dL (ref 6–23)
CO2: 28 mEq/L (ref 19–32)
Calcium: 9.2 mg/dL (ref 8.4–10.5)
Chloride: 104 mEq/L (ref 96–112)
Creatinine, Ser: 0.73 mg/dL (ref 0.40–1.20)
GFR: 89.82 mL/min (ref >60.00)
Glucose, Bld: 103 mg/dL — ABNORMAL HIGH (ref 70–99)
Potassium: 4.1 mEq/L (ref 3.5–5.1)
Sodium: 138 mEq/L (ref 135–145)
Total Bilirubin: 0.6 mg/dL (ref 0.2–1.2)
Total Protein: 7 g/dL (ref 6.0–8.3)

## 2016-05-24 LAB — HEPATIC FUNCTION PANEL
ALT: 17 U/L (ref 0–35)
AST: 15 U/L (ref 0–37)
Albumin: 4.4 g/dL (ref 3.5–5.2)
Alkaline Phosphatase: 61 U/L (ref 39–117)
Bilirubin, Direct: 0.1 mg/dL (ref 0.0–0.3)
Total Bilirubin: 0.6 mg/dL (ref 0.2–1.2)
Total Protein: 7 g/dL (ref 6.0–8.3)

## 2016-05-24 LAB — POCT URINALYSIS DIPSTICK
Bilirubin, UA: NEGATIVE
Glucose, UA: NEGATIVE
Ketones, UA: NEGATIVE
Leukocytes, UA: NEGATIVE
Nitrite, UA: NEGATIVE
Protein, UA: NEGATIVE
Spec Grav, UA: 1.02
Urobilinogen, UA: NEGATIVE
pH, UA: 6.5

## 2016-05-24 LAB — LIPASE: Lipase: 33 U/L (ref 11.0–59.0)

## 2016-05-24 LAB — AMYLASE: Amylase: 28 U/L (ref 27–131)

## 2016-05-24 NOTE — Progress Notes (Signed)
Pre visit review using our clinic review tool, if applicable. No additional management support is needed unless otherwise documented below in the visit note. 

## 2016-05-24 NOTE — Patient Instructions (Signed)
Please complete lab work prior to leaving. Call if symptoms worsen, or if symptoms do not improve. We will contact you with your results.

## 2016-05-24 NOTE — Progress Notes (Signed)
Subjective:    Patient ID: Lindsey [name], female    DOB: 1966-12-15, 50 y.o.   MRN: [id number]  HPI  [name] is a 50 yr old female who presents today with chief complaint of left sided back pain.   Pain has been present x 3 weeks and is associated with nausea. Denies vomiting. Pain is intermittent. Improved by ibuprofen, not worsened by movement. Denies fever.  Denies hematuria.  Had an ablation.  Not menstruating.  Has an appointment with urologist on Monday. Previous stones.  She sees Dr. Nevada Crane. Feels different then previous kidney stones.   Review of Systems  Genitourinary: Negative for dysuria and frequency.   Past Medical History  Diagnosis Date  . Hyperlipidemia   . Hypertension      Social History   Social History  . Marital Status: Married    Spouse Name: N/A  . Number of Children: N/A  . Years of Education: N/A   Occupational History  . Not on file.   Social History Main Topics  . Smoking status: Former Research scientist (life sciences)  . Smokeless tobacco: Not on file  . Alcohol Use: No  . Drug Use: No  . Sexual Activity: Yes   Other Topics Concern  . Not on file   Social History Narrative   Occupation: Babysit   Married   Regular exercise- yes    Past Surgical History  Procedure Laterality Date  . Leep    . Lithotripsy      Family History  Problem Relation Age of Onset  . Dementia Father   . Alzheimer's disease Father   . Hyperlipidemia    . Hypertension    . Diabetes      Allergies  Allergen Reactions  . Macrobid [Nitrofurantoin Macrocrystal] Other (See Comments)    abd pain    Current Outpatient Prescriptions on File Prior to Visit  Medication Sig Dispense Refill  . albuterol (PROVENTIL HFA;VENTOLIN HFA) 108 (90 BASE) MCG/ACT inhaler Inhale 2 puffs into the lungs every 6 (six) hours as needed for wheezing or shortness of breath. 1 Inhaler 2  . ALPRAZolam (XANAX) 0.5 MG tablet Take 1 tablet (0.5 mg total) by mouth as needed for anxiety. 10 tablet 0  .  cetirizine (ZYRTEC) 10 MG tablet Take 10 mg by mouth daily.      . Cyanocobalamin (VITAMIN B 12 PO) Take by mouth.      . ezetimibe (ZETIA) 10 MG tablet TAKE ONE (1) TABLET BY MOUTH EVERY DAY-OFFICE VISIT DUE NOW 30 tablet 0  . FLUoxetine (PROZAC) 20 MG capsule TAKE ONE (1) CAPSULE EACH DAY 30 capsule 3  . montelukast (SINGULAIR) 10 MG tablet Take 1 tablet (10 mg total) by mouth daily. 30 tablet 4  . omega-3 acid ethyl esters (LOVAZA) 1 g capsule Take 2 capsules (2 g total) by mouth 2 (two) times daily. 120 capsule 1  . potassium citrate (UROCIT-K) 10 MEQ (1080 MG) SR tablet Take 2 tablets by mouth daily.    . valsartan (DIOVAN) 320 MG tablet TAKE ONE (1) TABLET BY MOUTH EVERY DAY 30 tablet 0   No current facility-administered medications on file prior to visit.    BP 124/60 mmHg  Pulse 91  Temp(Src) 98.4 F (36.9 C) (Oral)  Resp 18  Ht 5' 4"  (1.626 m)  Wt 206 lb 6.4 oz (93.622 kg)  BMI 35.41 kg/m2  SpO2 98%       Objective:   Physical Exam  Constitutional: She appears well-developed and well-nourished.  Cardiovascular: Normal rate, regular rhythm and normal heart sounds.   No murmur heard. Pulmonary/Chest: Effort normal and breath sounds normal. No respiratory distress. She has no wheezes.  Abdominal: Bowel sounds are normal. There is tenderness in the epigastric area and left upper quadrant. There is no rigidity, no guarding and no CVA tenderness.  Musculoskeletal:       Cervical back: She exhibits no tenderness.       Thoracic back: She exhibits no tenderness.       Lumbar back: She exhibits no tenderness.  Psychiatric: She has a normal mood and affect. Her behavior is normal. Judgment and thought content normal.          Assessment & Plan:  Epigastric tenderness- ? Pancreatitis.  Obtain cmet, lipase/amylase.  Consider further imaging, GI consult if symptoms worsen, or do not improve or if work up is unrevealing. She is advised to keep her upcoming appointment with her  urologist. Advised OK to use ibuprofen short term for pain as this is helping (if pain persists beyond a few days- then switch to tylenol).

## 2016-05-25 ENCOUNTER — Encounter: Payer: Self-pay | Admitting: Family

## 2016-05-25 DIAGNOSIS — R109 Unspecified abdominal pain: Secondary | ICD-10-CM

## 2016-05-25 LAB — URINE CULTURE: Colony Count: 75000

## 2016-05-26 NOTE — Telephone Encounter (Signed)
Yes please. Can you please give me the contact info?

## 2016-05-26 NOTE — Telephone Encounter (Signed)
See mychart message- advised pt on phone that we will call her Monday to set up CT after we gain insurance approval.

## 2016-05-26 NOTE — Telephone Encounter (Signed)
Attempted to reach pt by phone and left message to check mychart message and let us know if she has any questions. Message sent.

## 2016-05-26 NOTE — Telephone Encounter (Signed)
Anderson Malta- please see mychart-need to schedule for Monday please.

## 2016-05-26 NOTE — Telephone Encounter (Signed)
Please contact pt and let her know that since her pain has not improved I would like her to complete a CT scan today.  I do not think that her diarrhea is med related.

## 2016-05-29 ENCOUNTER — Other Ambulatory Visit: Payer: Self-pay | Admitting: Family

## 2016-05-29 ENCOUNTER — Other Ambulatory Visit (INDEPENDENT_AMBULATORY_CARE_PROVIDER_SITE_OTHER): Payer: BLUE CROSS/BLUE SHIELD

## 2016-05-29 ENCOUNTER — Ambulatory Visit (HOSPITAL_BASED_OUTPATIENT_CLINIC_OR_DEPARTMENT_OTHER)
Admission: RE | Admit: 2016-05-29 | Discharge: 2016-05-29 | Disposition: A | Discharge status: Home / Self Care | Payer: BLUE CROSS/BLUE SHIELD | Source: Ambulatory Visit | Attending: Family | Admitting: Family

## 2016-05-29 DIAGNOSIS — R109 Unspecified abdominal pain: Secondary | ICD-10-CM

## 2016-05-29 DIAGNOSIS — N200 Calculus of kidney: Secondary | ICD-10-CM | POA: Diagnosis not present

## 2016-05-29 DIAGNOSIS — R1012 Left upper quadrant pain: Secondary | ICD-10-CM | POA: Diagnosis not present

## 2016-05-29 NOTE — Telephone Encounter (Signed)
Ph # [phone], Insurance ID# [id number]   Contacted [employer], was prompted to leave message for nurse reviewer to call back. Left my cell number.

## 2016-05-29 NOTE — Telephone Encounter (Signed)
Notified pt of CT today at 1pm. Pt states she has a urology appt at 2pm today and requests later time. States she is unable to come in earlier today. Appt scheduled for 5:30pm and pt advised to be in radiology at 4pm. Pt aware not to eat 4 hours prior to CT.  Also advised pt to come upstairs for lab draw prior to CT. Lab order entered and appt scheduled.

## 2016-05-29 NOTE — Telephone Encounter (Signed)
Spoke to [employer], CT authorization was obtained for CT abdomen only (not pelvis) [phone]. I have scheduled pt for CT at 1PM, she needs to arrive at 11:30 to drink oral contrast.  Nothing to eat or drink between now and test.  When she is here,  I would also like to have her come to the lab to complete a CBC with diff.

## 2016-05-30 ENCOUNTER — Telehealth: Payer: Self-pay | Admitting: Family

## 2016-05-30 LAB — CBC WITH DIFFERENTIAL/PLATELET
Basophils Absolute: 0.1 10*3/uL (ref 0.0–0.1)
Basophils Relative: 0.6 % (ref 0.0–3.0)
Eosinophils Absolute: 0.2 10*3/uL (ref 0.0–0.7)
Eosinophils Relative: 1.8 % (ref 0.0–5.0)
HCT: 41 % (ref 36.0–46.0)
Hemoglobin: 13.5 g/dL (ref 12.0–15.0)
Lymphocytes Relative: 26.5 % (ref 12.0–46.0)
Lymphs Abs: 2.6 10*3/uL (ref 0.7–4.0)
MCHC: 33 g/dL (ref 30.0–36.0)
MCV: 88.7 fl (ref 78.0–100.0)
Monocytes Absolute: 0.8 10*3/uL (ref 0.1–1.0)
Monocytes Relative: 8.4 % (ref 3.0–12.0)
Neutro Abs: 6.2 10*3/uL (ref 1.4–7.7)
Neutrophils Relative %: 62.7 % (ref 43.0–77.0)
Platelets: 250 10*3/uL (ref 150.0–400.0)
RBC: 4.63 Mil/uL (ref 3.87–5.11)
RDW: 13.4 % (ref 11.5–15.5)
WBC: 9.9 10*3/uL (ref 4.0–10.5)

## 2016-05-30 NOTE — Telephone Encounter (Signed)
Please let pt know that her blood count is normal. CT scan shows bilateral kidney stones.  Largest on the left. Otherwise no concerning findings.  I believe she already has a urologist, so she should continue to follow with them.  My only other suggestion at this time would be a GI referral if she is still having pain.  Let me know and I will place referral.

## 2016-05-30 NOTE — Telephone Encounter (Signed)
Notified pt. States she saw urologist yesterday before her CT scan and was told that her stones could still be causing her pain even if there was no obstruction. States she is going to follow up with him and will let us know if she decides to proceed with GI referral.

## 2016-06-05 ENCOUNTER — Other Ambulatory Visit: Payer: Self-pay | Admitting: Obstetrics & Gynecology

## 2016-06-05 DIAGNOSIS — N63 Unspecified lump in unspecified breast: Secondary | ICD-10-CM

## 2016-06-07 ENCOUNTER — Encounter: Payer: Self-pay | Admitting: *Deleted

## 2016-06-08 ENCOUNTER — Encounter: Payer: Self-pay | Admitting: Internal Medicine

## 2016-06-08 ENCOUNTER — Ambulatory Visit (INDEPENDENT_AMBULATORY_CARE_PROVIDER_SITE_OTHER): Payer: BLUE CROSS/BLUE SHIELD | Admitting: Internal Medicine

## 2016-06-08 VITALS — BP 126/82 | HR 72 | Ht 64.0 in | Wt 202.6 lb

## 2016-06-08 DIAGNOSIS — R1012 Left upper quadrant pain: Secondary | ICD-10-CM

## 2016-06-08 DIAGNOSIS — Z1211 Encounter for screening for malignant neoplasm of colon: Secondary | ICD-10-CM | POA: Diagnosis not present

## 2016-06-08 MED ORDER — NA SULFATE-K SULFATE-MG SULF 17.5-3.13-1.6 GM/177ML PO SOLN: ORAL | Status: DC

## 2016-06-08 NOTE — Patient Instructions (Signed)
You have been scheduled for an endoscopy and colonoscopy. Please follow the written instructions given to you at your visit today. Please pick up your prep supplies at the pharmacy within the next 1-3 days. If you use inhalers (even only as needed), please bring them with you on the day of your procedure. Your physician has requested that you go to www.startemmi.com and enter the access code given to you at your visit today. This web site gives a general overview about your procedure. However, you should still follow specific instructions given to you by our office regarding your preparation for the procedure.  If you are age 25 or older, your body mass index should be between 23-30. Your Body mass index is 34.76 kg/(m^2). If this is out of the aforementioned range listed, please consider follow up with your Primary Care Provider.  If you are age 49 or younger, your body mass index should be between 19-25. Your Body mass index is 34.76 kg/(m^2). If this is out of the aformentioned range listed, please consider follow up with your Primary Care Provider.

## 2016-06-08 NOTE — Progress Notes (Signed)
Patient ID: Lindsey [name], female   DOB: 01-24-1966, 50 y.o.   MRN: [id number] HPI: Lindsey [name] is a 50 year old female with a past medical history of kidney stones, hypertension and hyperlipidemia who is seen in consultation at the request of Debbrah Alar, NP to evaluate left upper quadrant abdominal pain. She reports for the past 1+ month she has been experiencing pulsing left upper quadrant abdominal pain. This is random. Seems to be worse with standing a lot. Is not affected by eating. When the pain is intense it can be 8 out of 10 and associated with nausea but no vomiting. No change in appetite. No otherwise nausea or vomiting. No heartburn. No trouble swallowing. She uses ibuprofen which makes the pain considerably better. She uses 600 mg ibuprofen at a time. The pain does not seem to radiate. Bowel movements have been regular occurring 2 times per day. She's use Benefiber or years for regularity. Occasionally stools can be loose. She's had no change in bowel habit or how frequency. No blood in her stool or melena. No new medicines. Initially it was felt that her pain was related to known kidney stones but they were stable after a CT scan was performed. She follows with Dr. Nevada Crane with urology in [facility]. CT scan of the abdomen and pelvis was performed on 05/29/2016 -- this revealed bilateral kidney stones with 2 large stones in the lower pole of the left renal pelvis. These were not significant changed in size compared to January 2016. There is no frank hydronephrosis. There are some small clustered lymph nodes in the right lower quadrant and scattered in the central abdominal mesentery. Felt to be benign incidental finding. Remainder of the CT was unremarkable. Labs were also performed in unrevealing.  She reports she had a paternal grandmother with colon cancer late in life. She has a nephew with colitis.  Past Medical History  Diagnosis Date  . Hyperlipidemia   . Hypertension   .  Nephrolithiasis     Past Surgical History  Procedure Laterality Date  . Leep    . Lithotripsy      Outpatient Prescriptions Prior to Visit  Medication Sig Dispense Refill  . albuterol (PROVENTIL HFA;VENTOLIN HFA) 108 (90 BASE) MCG/ACT inhaler Inhale 2 puffs into the lungs every 6 (six) hours as needed for wheezing or shortness of breath. 1 Inhaler 2  . ALPRAZolam (XANAX) 0.5 MG tablet Take 1 tablet (0.5 mg total) by mouth as needed for anxiety. 10 tablet 0  . cetirizine (ZYRTEC) 10 MG tablet Take 10 mg by mouth daily.      . Cyanocobalamin (VITAMIN B 12 PO) Take by mouth.      . ezetimibe (ZETIA) 10 MG tablet TAKE ONE (1) TABLET BY MOUTH EVERY DAY-OFFICE VISIT DUE NOW 30 tablet 0  . FLUoxetine (PROZAC) 20 MG capsule TAKE ONE (1) CAPSULE EACH DAY 30 capsule 3  . montelukast (SINGULAIR) 10 MG tablet Take 1 tablet (10 mg total) by mouth daily. 30 tablet 4  . omega-3 acid ethyl esters (LOVAZA) 1 g capsule Take 2 capsules (2 g total) by mouth 2 (two) times daily. 120 capsule 1  . potassium citrate (UROCIT-K) 10 MEQ (1080 MG) SR tablet Take 2 tablets by mouth daily.    . Probiotic Product (PROBIOTIC ADVANCED PO) Take 1 capsule by mouth daily. Reported on 06/08/2016    . valsartan (DIOVAN) 320 MG tablet TAKE ONE (1) TABLET BY MOUTH EVERY DAY 30 tablet 0   No facility-administered medications  prior to visit.    Allergies  Allergen Reactions  . Macrobid [Nitrofurantoin Macrocrystal] Other (See Comments)    abd pain    Family History  Problem Relation Age of Onset  . Dementia Father   . Alzheimer's disease Father   . Hyperlipidemia    . Hypertension    . Diabetes      Social History  Substance Use Topics  . Smoking status: Former Research scientist (life sciences)  . Smokeless tobacco: None  . Alcohol Use: No    ROS: As per history of present illness, otherwise negative  BP 126/82 mmHg  Pulse 72  Ht 5' 4"  (1.626 m)  Wt 202 lb 9.6 oz (91.899 kg)  BMI 34.76 kg/m2  SpO2 98% Constitutional:  Well-developed and well-nourished. No distress. HEENT: Normocephalic and atraumatic. Oropharynx is clear and moist. No oropharyngeal exudate. Conjunctivae are normal.  No scleral icterus. Neck: Neck supple. Trachea midline. Cardiovascular: Normal rate, regular rhythm and intact distal pulses. No M/R/G Pulmonary/chest: Effort normal and breath sounds normal. No wheezing, rales or rhonchi. Abdominal: Soft, focal tenderness in the left upper quadrant just below the rib cage of about 2 cm area. Carnett sign is negative, nondistended. Bowel sounds active throughout. There are no masses palpable. No hepatosplenomegaly. Extremities: no clubbing, cyanosis, or edema Lymphadenopathy: No cervical adenopathy noted. Neurological: Alert and oriented to person place and time. Skin: Skin is warm and dry. No rashes noted. Psychiatric: Normal mood and affect. Behavior is normal.  RELEVANT LABS AND IMAGING: CBC    Component Value Date/Time   WBC 9.9 05/29/2016 1536   RBC 4.63 05/29/2016 1536   HGB 13.5 05/29/2016 1536   HCT 41.0 05/29/2016 1536   PLT 250.0 05/29/2016 1536   MCV 88.7 05/29/2016 1536   MCH 27.8 01/05/2015 1235   MCHC 33.0 05/29/2016 1536   RDW 13.4 05/29/2016 1536   LYMPHSABS 2.6 05/29/2016 1536   MONOABS 0.8 05/29/2016 1536   EOSABS 0.2 05/29/2016 1536   BASOSABS 0.1 05/29/2016 1536    CMP     Component Value Date/Time   NA 138 05/24/2016 0827   K 4.1 05/24/2016 0827   CL 104 05/24/2016 0827   CO2 28 05/24/2016 0827   GLUCOSE 103* 05/24/2016 0827   GLUCOSE 94 12/31/2006 1033   BUN 16 05/24/2016 0827   CREATININE 0.73 05/24/2016 0827   CREATININE 0.66 11/12/2015 1452   CALCIUM 9.2 05/24/2016 0827   PROT 7.0 05/24/2016 0827   PROT 7.0 05/24/2016 0827   ALBUMIN 4.4 05/24/2016 0827   ALBUMIN 4.4 05/24/2016 0827   AST 15 05/24/2016 0827   AST 15 05/24/2016 0827   ALT 17 05/24/2016 0827   ALT 17 05/24/2016 0827   ALKPHOS 61 05/24/2016 0827   ALKPHOS 61 05/24/2016 0827    BILITOT 0.6 05/24/2016 0827   BILITOT 0.6 05/24/2016 0827   GFRNONAA >90 01/05/2015 1235   GFRAA >90 01/05/2015 1235    ASSESSMENT/PLAN: 50 year old female with a past medical history of kidney stones, hypertension and hyperlipidemia who is seen in consultation at the request of Debbrah Alar, NP to evaluate left upper quadrant abdominal pain.  1. LUQ pain -- Could be musculoskeletal or nerve related pain. Suspicion for a GI source is low given the lack of other GI symptoms. CT scan did not show any abnormality in this area though she does have known kidney stones. Kidney stones also remain in the differential for this pain. To exclude a GI source after discussion we will proceed with upper endoscopy.  We discussed the risks, benefits and alternatives and she is agreeable to proceed.  2. Colon cancer screening -- colonoscopy recommended. We discussed the risk benefits and alternatives and she is agreeable to proceed.      HK:NZUDODQ O'sullivan, [address], [place] [address]

## 2016-06-14 ENCOUNTER — Ambulatory Visit
Admission: RE | Admit: 2016-06-14 | Discharge: 2016-06-14 | Disposition: A | Discharge status: Home / Self Care | Payer: BLUE CROSS/BLUE SHIELD | Source: Ambulatory Visit | Attending: Obstetrics & Gynecology | Admitting: Obstetrics & Gynecology

## 2016-06-14 DIAGNOSIS — N63 Unspecified lump in unspecified breast: Secondary | ICD-10-CM

## 2016-06-20 ENCOUNTER — Other Ambulatory Visit: Payer: Self-pay

## 2016-06-20 MED ORDER — VALSARTAN 320 MG PO TABS: ORAL_TABLET | ORAL | Status: DC

## 2016-06-20 MED ORDER — EZETIMIBE 10 MG PO TABS: ORAL_TABLET | ORAL | Status: DC

## 2016-06-21 ENCOUNTER — Other Ambulatory Visit: Payer: Self-pay | Admitting: Obstetrics & Gynecology

## 2016-06-26 ENCOUNTER — Encounter: Payer: Self-pay | Admitting: Internal Medicine

## 2016-07-03 ENCOUNTER — Encounter: Payer: BLUE CROSS/BLUE SHIELD | Admitting: Internal Medicine

## 2016-07-20 ENCOUNTER — Other Ambulatory Visit: Payer: Self-pay

## 2016-07-20 MED ORDER — FLUOXETINE HCL 20 MG PO CAPS: ORAL_CAPSULE | ORAL | 3 refills | Status: DC

## 2016-07-20 MED ORDER — MONTELUKAST SODIUM 10 MG PO TABS: 10.0000 mg | ORAL_TABLET | Freq: Every day | ORAL | 0 refills | Status: DC

## 2016-08-18 ENCOUNTER — Other Ambulatory Visit: Payer: Self-pay

## 2016-08-23 ENCOUNTER — Ambulatory Visit (INDEPENDENT_AMBULATORY_CARE_PROVIDER_SITE_OTHER): Payer: BLUE CROSS/BLUE SHIELD | Admitting: Allergy and Immunology

## 2016-08-23 ENCOUNTER — Encounter: Payer: Self-pay | Admitting: Allergy and Immunology

## 2016-08-23 VITALS — BP 110/74 | HR 74 | Temp 98.4°F | Resp 16

## 2016-08-23 DIAGNOSIS — J310 Chronic rhinitis: Secondary | ICD-10-CM | POA: Diagnosis not present

## 2016-08-23 DIAGNOSIS — L501 Idiopathic urticaria: Secondary | ICD-10-CM

## 2016-08-23 DIAGNOSIS — J4520 Mild intermittent asthma, uncomplicated: Secondary | ICD-10-CM | POA: Diagnosis not present

## 2016-08-23 MED ORDER — RANITIDINE HCL 150 MG PO TABS: ORAL_TABLET | ORAL | 5 refills | Status: DC

## 2016-08-23 MED ORDER — LEVOCETIRIZINE DIHYDROCHLORIDE 5 MG PO TABS: 5.0000 mg | ORAL_TABLET | Freq: Every evening | ORAL | 5 refills | Status: DC

## 2016-08-23 MED ORDER — FLUTICASONE PROPIONATE 50 MCG/ACT NA SUSP: NASAL | 5 refills | Status: DC

## 2016-08-23 MED ORDER — ALBUTEROL SULFATE HFA 108 (90 BASE) MCG/ACT IN AERS: 2.0000 | INHALATION_SPRAY | RESPIRATORY_TRACT | 2 refills | Status: DC | PRN

## 2016-08-23 MED ORDER — MONTELUKAST SODIUM 10 MG PO TABS: 10.0000 mg | ORAL_TABLET | Freq: Every day | ORAL | 5 refills | Status: DC

## 2016-08-23 NOTE — Assessment & Plan Note (Signed)
   A prescription has been provided for fluticasone nasal spray, 2 sprays per nostril daily as needed. Proper nasal spray technique has been discussed and demonstrated.  I have also recommended nasal saline spray (i.e., Simply Saline) or nasal saline lavage (i.e., NeilMed) as needed prior to medicated nasal sprays.

## 2016-08-23 NOTE — Assessment & Plan Note (Signed)
   Continue albuterol HFA, 1-2 inhalations every 4-6 hours as needed.  Subjective and objective measures of pulmonary function will be followed and the treatment plan will be adjusted accordingly.

## 2016-08-23 NOTE — Patient Instructions (Addendum)
Idiopathic urticaria Suboptimally controlled.  Instructions have been discussed and provided for H1/H2 receptor blockade with titration to find lowest effective dose.  A prescription has been provided for levocetirizine, 19m daily as needed.  A prescription has been provided for ranitidine 150 mg twice a day as needed.  Continue montelukast 10 mg daily.  Mild intermittent asthma  Continue albuterol HFA, 1-2 inhalations every 4-6 hours as needed.  Subjective and objective measures of pulmonary function will be followed and the treatment plan will be adjusted accordingly.  Chronic rhinitis  A prescription has been provided for fluticasone nasal spray, 2 sprays per nostril daily as needed. Proper nasal spray technique has been discussed and demonstrated.  I have also recommended nasal saline spray (i.e., Simply Saline) or nasal saline lavage (i.e., NeilMed) as needed prior to medicated nasal sprays.   Return in about 1 year (around 08/23/2017), or if symptoms worsen or fail to improve.   Urticaria (Hives)  . Levocetirizine (Xyzal) 5 mg twice a day and ranitidine (Zantac) 150 mg twice a day. If no symptoms for 7-14 days then decrease to. . Levocetirizine (Xyzal) 5 mg twice a day and ranitidine (Zantac) 150 mg once a day.  If no symptoms for 7-14 days then decrease to. . Levocetirizine (Xyzal) 5 mg twice a day.  If no symptoms for 7-14 days then decrease to. . Levocetirizine (Xyzal) 5 mg once a day.  Montelukast 184mdaily.  May use Benadryl (diphenhydramine) as needed for breakthrough symptoms       If symptoms return, then step up dosage

## 2016-08-23 NOTE — Progress Notes (Signed)
Follow-up Note  RE: Lindsey [name] MRN: [id number] DOB: [birth year] Date of Office Visit: 08/23/2016  Primary care provider: Ann Held, DO Referring provider: Carollee Herter, Alferd Apa, *  History of present illness: Lindsey [name] is a 50 y.o. female intermittent wheezing and history of chronic idiopathic urticaria presenting today for follow up.  She was last seen in this clinic in September 2016.  She states that until recently, her urticaria had been well-controlled with montelukast 10 mg daily and cetirizine 10 mg daily.  However, she has been experiencing breakthrough symptoms over the past week or 2.  She rarely requires albuterol rescue, typically with upper respiratory tract infections.  She reports that she has recently been experiencing nasal congestion and sinus pressure despite compliance with cetirizine 10 mg daily.  She only does not use a medicated nasal spray.   Assessment and plan: Idiopathic urticaria Suboptimally controlled.  Instructions have been discussed and provided for H1/H2 receptor blockade with titration to find lowest effective dose.  A prescription has been provided for levocetirizine, 63m daily as needed.  A prescription has been provided for ranitidine 150 mg twice a day as needed.  Continue montelukast 10 mg daily.  Mild intermittent asthma  Continue albuterol HFA, 1-2 inhalations every 4-6 hours as needed.  Subjective and objective measures of pulmonary function will be followed and the treatment plan will be adjusted accordingly.  Chronic rhinitis  A prescription has been provided for fluticasone nasal spray, 2 sprays per nostril daily as needed. Proper nasal spray technique has been discussed and demonstrated.  I have also recommended nasal saline spray (i.e., Simply Saline) or nasal saline lavage (i.e., NeilMed) as needed prior to medicated nasal sprays.   Meds ordered this encounter  Medications  . montelukast (SINGULAIR)  10 MG tablet    Sig: Take 1 tablet (10 mg total) by mouth at bedtime.    Dispense:  30 tablet    Refill:  5  . levocetirizine (XYZAL) 5 MG tablet    Sig: Take 1 tablet (5 mg total) by mouth every evening.    Dispense:  30 tablet    Refill:  5  . ranitidine (ZANTAC) 150 MG tablet    Sig: TAKE ONE TABLET ONE TO TWO TIMES A DAY AS DIRECTED.    Dispense:  60 tablet    Refill:  5  . albuterol (PROVENTIL HFA;VENTOLIN HFA) 108 (90 Base) MCG/ACT inhaler    Sig: Inhale 2 puffs into the lungs every 4 (four) hours as needed for wheezing or shortness of breath.    Dispense:  1 Inhaler    Refill:  2  . fluticasone (FLONASE) 50 MCG/ACT nasal spray    Sig: TWO SPRAYS EACH NOSTRIL ONCE A DAY FOR NASAL CONGESTION OR DRAINAGE.    Dispense:  16 g    Refill:  5    Diagnositics: Spirometry:  Normal with an FEV1 of 94% predicted.  Please see scanned spirometry results for details.    Physical examination: Blood pressure 110/74, pulse 74, temperature 98.4 F (36.9 C), temperature source Oral, resp. rate 16, SpO2 97 %.  General: Alert, interactive, in no acute distress. HEENT: TMs pearly gray, turbinates moderately edematous without discharge, post-pharynx moderately erythematous. Neck: Supple without lymphadenopathy. Lungs: Clear to auscultation without wheezing, rhonchi or rales. CV: Normal S1, S2 without murmurs. Skin: Warm and dry, without lesions or rashes.  The following portions of the patient's history were reviewed and updated as appropriate: allergies, current medications, past family  history, past medical history, past social history, past surgical history and problem list.    Medication List       Accurate as of 08/23/16  6:36 PM. Always use your most recent med list.          albuterol 108 (90 Base) MCG/ACT inhaler Commonly known as:  PROVENTIL HFA;VENTOLIN HFA Inhale 2 puffs into the lungs every 4 (four) hours as needed for wheezing or shortness of breath.   ALPRAZolam 0.5 MG  tablet Commonly known as:  XANAX Take 1 tablet (0.5 mg total) by mouth as needed for anxiety.   cetirizine 10 MG tablet Commonly known as:  ZYRTEC Take 10 mg by mouth daily.   ezetimibe 10 MG tablet Commonly known as:  ZETIA TAKE ONE (1) TABLET BY MOUTH EVERY DAY   FLUoxetine 20 MG capsule Commonly known as:  PROZAC TAKE ONE (1) CAPSULE EACH DAY   fluticasone 50 MCG/ACT nasal spray Commonly known as:  FLONASE TWO SPRAYS EACH NOSTRIL ONCE A DAY FOR NASAL CONGESTION OR DRAINAGE.   levocetirizine 5 MG tablet Commonly known as:  XYZAL Take 1 tablet (5 mg total) by mouth every evening.   montelukast 10 MG tablet Commonly known as:  SINGULAIR Take 1 tablet (10 mg total) by mouth at bedtime.   Na Sulfate-K Sulfate-Mg Sulf 17.5-3.13-1.6 GM/180ML Soln Suprep-Use as directed   omega-3 acid ethyl esters 1 g capsule Commonly known as:  LOVAZA Take 2 capsules (2 g total) by mouth 2 (two) times daily.   potassium citrate 10 MEQ (1080 MG) SR tablet Commonly known as:  UROCIT-K Take 2 tablets by mouth daily.   PROBIOTIC ADVANCED PO Take 1 capsule by mouth daily. Reported on 06/08/2016   ranitidine 150 MG tablet Commonly known as:  ZANTAC TAKE ONE TABLET ONE TO TWO TIMES A DAY AS DIRECTED.   valsartan 320 MG tablet Commonly known as:  DIOVAN TAKE ONE (1) TABLET BY MOUTH EVERY DAY   VITAMIN B 12 PO Take by mouth.       Allergies  Allergen Reactions  . Macrobid [Nitrofurantoin Macrocrystal] Other (See Comments)    abd pain   Review of systems: Review of systems negative except as noted in HPI / PMHx or noted below: Constitutional: Negative.  HENT: Negative.   Eyes: Negative.  Respiratory: Negative.   Cardiovascular: Negative.  Gastrointestinal: Negative.  Genitourinary: Negative.  Musculoskeletal: Negative.  Neurological: Negative.  Endo/Heme/Allergies: Negative.  Cutaneous: Negative.  Past Medical History:  Diagnosis Date  . Hyperlipidemia   . Hypertension     . Nephrolithiasis     Family History  Problem Relation Age of Onset  . Dementia Father   . Alzheimer's disease Father   . Hyperlipidemia    . Hypertension    . Diabetes      Social History   Social History  . Marital status: Married    Spouse name: Lindsey [name]  . Number of children: Lindsey [name]  . Years of education: Lindsey [name]   Occupational History  . Not on file.   Social History Main Topics  . Smoking status: Former Research scientist (life sciences)  . Smokeless tobacco: Never Used  . Alcohol use No  . Drug use: No  . Sexual activity: Yes   Other Topics Concern  . Not on file   Social History Narrative   Occupation: [employer]   Married   Regular exercise- yes    I appreciate the opportunity to take part in Harlan's care. Please do not hesitate to contact me with  questions.  Sincerely,   R. Edgar Frisk, MD

## 2016-08-23 NOTE — Assessment & Plan Note (Addendum)
Suboptimally controlled.  Instructions have been discussed and provided for H1/H2 receptor blockade with titration to find lowest effective dose.  A prescription has been provided for levocetirizine, 65m daily as needed.  A prescription has been provided for ranitidine 150 mg twice a day as needed.  Continue montelukast 10 mg daily.

## 2016-09-19 ENCOUNTER — Other Ambulatory Visit: Payer: Self-pay

## 2016-09-19 MED ORDER — VALSARTAN 320 MG PO TABS: ORAL_TABLET | ORAL | 0 refills | Status: DC

## 2016-10-17 ENCOUNTER — Ambulatory Visit (INDEPENDENT_AMBULATORY_CARE_PROVIDER_SITE_OTHER): Payer: BLUE CROSS/BLUE SHIELD | Admitting: Family Medicine

## 2016-10-17 ENCOUNTER — Encounter: Payer: Self-pay | Admitting: Family Medicine

## 2016-10-17 ENCOUNTER — Other Ambulatory Visit: Payer: Self-pay

## 2016-10-17 VITALS — BP 119/80 | HR 77 | Temp 97.9°F | Ht 64.0 in | Wt 211.6 lb

## 2016-10-17 DIAGNOSIS — Z23 Encounter for immunization: Secondary | ICD-10-CM

## 2016-10-17 DIAGNOSIS — Z0000 Encounter for general adult medical examination without abnormal findings: Secondary | ICD-10-CM

## 2016-10-17 DIAGNOSIS — E785 Hyperlipidemia, unspecified: Secondary | ICD-10-CM

## 2016-10-17 DIAGNOSIS — E538 Deficiency of other specified B group vitamins: Secondary | ICD-10-CM

## 2016-10-17 DIAGNOSIS — I10 Essential (primary) hypertension: Secondary | ICD-10-CM | POA: Diagnosis not present

## 2016-10-17 DIAGNOSIS — F411 Generalized anxiety disorder: Secondary | ICD-10-CM

## 2016-10-17 DIAGNOSIS — R739 Hyperglycemia, unspecified: Secondary | ICD-10-CM

## 2016-10-17 MED ORDER — FLUOXETINE HCL 20 MG PO CAPS: ORAL_CAPSULE | ORAL | 0 refills | Status: DC

## 2016-10-17 MED ORDER — VALSARTAN 320 MG PO TABS: ORAL_TABLET | ORAL | 0 refills | Status: DC

## 2016-10-17 MED ORDER — EZETIMIBE 10 MG PO TABS: ORAL_TABLET | ORAL | 0 refills | Status: DC

## 2016-10-17 NOTE — Progress Notes (Signed)
Pre visit review using our clinic review tool, if applicable. No additional management support is needed unless otherwise documented below in the visit note. 

## 2016-10-17 NOTE — Patient Instructions (Signed)
Preventive Care for Adults, Female A healthy lifestyle and preventive care can promote health and wellness. Preventive health guidelines for women include the following key practices.  A routine yearly physical is a good way to check with your health care provider about your health and preventive screening. It is a chance to share any concerns and updates on your health and to receive a thorough exam.  Visit your dentist for a routine exam and preventive care every 6 months. Brush your teeth twice a day and floss once a day. Good oral hygiene prevents tooth decay and gum disease.  The frequency of eye exams is based on your age, health, family medical history, use of contact lenses, and other factors. Follow your health care provider's recommendations for frequency of eye exams.  Eat a healthy diet. Foods like vegetables, fruits, whole grains, low-fat dairy products, and lean protein foods contain the nutrients you need without too many calories. Decrease your intake of foods high in solid fats, added sugars, and salt. Eat the right amount of calories for you.Get information about a proper diet from your health care provider, if necessary.  Regular physical exercise is one of the most important things you can do for your health. Most adults should get at least 150 minutes of moderate-intensity exercise (any activity that increases your heart rate and causes you to sweat) each week. In addition, most adults need muscle-strengthening exercises on 2 or more days a week.  Maintain a healthy weight. The body mass index (BMI) is a screening tool to identify possible weight problems. It provides an estimate of body fat based on height and weight. Your health care provider can find your BMI and can help you achieve or maintain a healthy weight.For adults 20 years and older:  A BMI below 18.5 is considered underweight.  A BMI of 18.5 to 24.9 is normal.  A BMI of 25 to 29.9 is considered overweight.  A  BMI of 30 and above is considered obese.  Maintain normal blood lipids and cholesterol levels by exercising and minimizing your intake of saturated fat. Eat a balanced diet with plenty of fruit and vegetables. Blood tests for lipids and cholesterol should begin at age 45 and be repeated every 5 years. If your lipid or cholesterol levels are high, you are over 50, or you are at high risk for heart disease, you may need your cholesterol levels checked more frequently.Ongoing high lipid and cholesterol levels should be treated with medicines if diet and exercise are not working.  If you smoke, find out from your health care provider how to quit. If you do not use tobacco, do not start.  Lung cancer screening is recommended for adults aged 45-80 years who are at high risk for developing lung cancer because of a history of smoking. A yearly low-dose CT scan of the lungs is recommended for people who have at least a 30-pack-year history of smoking and are a current smoker or have quit within the past 15 years. A pack year of smoking is smoking an average of 1 pack of cigarettes a day for 1 year (for example: 1 pack a day for 30 years or 2 packs a day for 15 years). Yearly screening should continue until the smoker has stopped smoking for at least 15 years. Yearly screening should be stopped for people who develop a health problem that would prevent them from having lung cancer treatment.  If you are pregnant, do not drink alcohol. If you are  breastfeeding, be very cautious about drinking alcohol. If you are not pregnant and choose to drink alcohol, do not have more than 1 drink per day. One drink is considered to be 12 ounces (355 mL) of beer, 5 ounces (148 mL) of wine, or 1.5 ounces (44 mL) of liquor.  Avoid use of street drugs. Do not share needles with anyone. Ask for help if you need support or instructions about stopping the use of drugs.  High blood pressure causes heart disease and increases the risk  of stroke. Your blood pressure should be checked at least every 1 to 2 years. Ongoing high blood pressure should be treated with medicines if weight loss and exercise do not work.  If you are 55-79 years old, ask your health care provider if you should take aspirin to prevent strokes.  Diabetes screening is done by taking a blood sample to check your blood glucose level after you have not eaten for a certain period of time (fasting). If you are not overweight and you do not have risk factors for diabetes, you should be screened once every 3 years starting at age 45. If you are overweight or obese and you are 40-70 years of age, you should be screened for diabetes every year as part of your cardiovascular risk assessment.  Breast cancer screening is essential preventive care for women. You should practice "breast self-awareness." This means understanding the normal appearance and feel of your breasts and may include breast self-examination. Any changes detected, no matter how small, should be reported to a health care provider. Women in their 20s and 30s should have a clinical breast exam (CBE) by a health care provider as part of a regular health exam every 1 to 3 years. After age 40, women should have a CBE every year. Starting at age 40, women should consider having a mammogram (breast X-ray test) every year. Women who have a family history of breast cancer should talk to their health care provider about genetic screening. Women at a high risk of breast cancer should talk to their health care providers about having an MRI and a mammogram every year.  Breast cancer gene (BRCA)-related cancer risk assessment is recommended for women who have family members with BRCA-related cancers. BRCA-related cancers include breast, ovarian, tubal, and peritoneal cancers. Having family members with these cancers may be associated with an increased risk for harmful changes (mutations) in the breast cancer genes BRCA1 and  BRCA2. Results of the assessment will determine the need for genetic counseling and BRCA1 and BRCA2 testing.  Your health care provider may recommend that you be screened regularly for cancer of the pelvic organs (ovaries, uterus, and vagina). This screening involves a pelvic examination, including checking for microscopic changes to the surface of your cervix (Pap test). You may be encouraged to have this screening done every 3 years, beginning at age 21.  For women ages 30-65, health care providers may recommend pelvic exams and Pap testing every 3 years, or they may recommend the Pap and pelvic exam, combined with testing for human papilloma virus (HPV), every 5 years. Some types of HPV increase your risk of cervical cancer. Testing for HPV may also be done on women of any age with unclear Pap test results.  Other health care providers may not recommend any screening for nonpregnant women who are considered low risk for pelvic cancer and who do not have symptoms. Ask your health care provider if a screening pelvic exam is right for   you.  If you have had past treatment for cervical cancer or a condition that could lead to cancer, you need Pap tests and screening for cancer for at least 20 years after your treatment. If Pap tests have been discontinued, your risk factors (such as having a new sexual partner) need to be reassessed to determine if screening should resume. Some women have medical problems that increase the chance of getting cervical cancer. In these cases, your health care provider may recommend more frequent screening and Pap tests.  Colorectal cancer can be detected and often prevented. Most routine colorectal cancer screening begins at the age of 50 years and continues through age 75 years. However, your health care provider may recommend screening at an earlier age if you have risk factors for colon cancer. On a yearly basis, your health care provider may provide home test kits to check  for hidden blood in the stool. Use of a small camera at the end of a tube, to directly examine the colon (sigmoidoscopy or colonoscopy), can detect the earliest forms of colorectal cancer. Talk to your health care provider about this at age 50, when routine screening begins. Direct exam of the colon should be repeated every 5-10 years through age 75 years, unless early forms of precancerous polyps or small growths are found.  People who are at an increased risk for hepatitis B should be screened for this virus. You are considered at high risk for hepatitis B if:  You were born in a country where hepatitis B occurs often. Talk with your health care provider about which countries are considered high risk.  Your parents were born in a high-risk country and you have not received a shot to protect against hepatitis B (hepatitis B vaccine).  You have HIV or AIDS.  You use needles to inject street drugs.  You live with, or have sex with, someone who has hepatitis B.  You get hemodialysis treatment.  You take certain medicines for conditions like cancer, organ transplantation, and autoimmune conditions.  Hepatitis C blood testing is recommended for all people born from 1945 through 1965 and any individual with known risks for hepatitis C.  Practice safe sex. Use condoms and avoid high-risk sexual practices to reduce the spread of sexually transmitted infections (STIs). STIs include gonorrhea, chlamydia, syphilis, trichomonas, herpes, HPV, and human immunodeficiency virus (HIV). Herpes, HIV, and HPV are viral illnesses that have no cure. They can result in disability, cancer, and death.  You should be screened for sexually transmitted illnesses (STIs) including gonorrhea and chlamydia if:  You are sexually active and are younger than 24 years.  You are older than 24 years and your health care provider tells you that you are at risk for this type of infection.  Your sexual activity has changed  since you were last screened and you are at an increased risk for chlamydia or gonorrhea. Ask your health care provider if you are at risk.  If you are at risk of being infected with HIV, it is recommended that you take a prescription medicine daily to prevent HIV infection. This is called preexposure prophylaxis (PrEP). You are considered at risk if:  You are sexually active and do not regularly use condoms or know the HIV status of your partner(s).  You take drugs by injection.  You are sexually active with a partner who has HIV.  Talk with your health care provider about whether you are at high risk of being infected with HIV. If   you choose to begin PrEP, you should first be tested for HIV. You should then be tested every 3 months for as long as you are taking PrEP.  Osteoporosis is a disease in which the bones lose minerals and strength with aging. This can result in serious bone fractures or breaks. The risk of osteoporosis can be identified using a bone density scan. Women ages 67 years and over and women at risk for fractures or osteoporosis should discuss screening with their health care providers. Ask your health care provider whether you should take a calcium supplement or vitamin D to reduce the rate of osteoporosis.  Menopause can be associated with physical symptoms and risks. Hormone replacement therapy is available to decrease symptoms and risks. You should talk to your health care provider about whether hormone replacement therapy is right for you.  Use sunscreen. Apply sunscreen liberally and repeatedly throughout the day. You should seek shade when your shadow is shorter than you. Protect yourself by wearing long sleeves, pants, a wide-brimmed hat, and sunglasses year round, whenever you are outdoors.  Once a month, do a whole body skin exam, using a mirror to look at the skin on your back. Tell your health care provider of new moles, moles that have irregular borders, moles that  are larger than a pencil eraser, or moles that have changed in shape or color.  Stay current with required vaccines (immunizations).  Influenza vaccine. All adults should be immunized every year.  Tetanus, diphtheria, and acellular pertussis (Td, Tdap) vaccine. Pregnant women should receive 1 dose of Tdap vaccine during each pregnancy. The dose should be obtained regardless of the length of time since the last dose. Immunization is preferred during the 27th-36th week of gestation. An adult who has not previously received Tdap or who does not know her vaccine status should receive 1 dose of Tdap. This initial dose should be followed by tetanus and diphtheria toxoids (Td) booster doses every 10 years. Adults with an unknown or incomplete history of completing a 3-dose immunization series with Td-containing vaccines should begin or complete a primary immunization series including a Tdap dose. Adults should receive a Td booster every 10 years.  Varicella vaccine. An adult without evidence of immunity to varicella should receive 2 doses or a second dose if she has previously received 1 dose. Pregnant females who do not have evidence of immunity should receive the first dose after pregnancy. This first dose should be obtained before leaving the health care facility. The second dose should be obtained 4-8 weeks after the first dose.  Human papillomavirus (HPV) vaccine. Females aged 13-26 years who have not received the vaccine previously should obtain the 3-dose series. The vaccine is not recommended for use in pregnant females. However, pregnancy testing is not needed before receiving a dose. If a female is found to be pregnant after receiving a dose, no treatment is needed. In that case, the remaining doses should be delayed until after the pregnancy. Immunization is recommended for any person with an immunocompromised condition through the age of 61 years if she did not get any or all doses earlier. During the  3-dose series, the second dose should be obtained 4-8 weeks after the first dose. The third dose should be obtained 24 weeks after the first dose and 16 weeks after the second dose.  Zoster vaccine. One dose is recommended for adults aged 30 years or older unless certain conditions are present.  Measles, mumps, and rubella (MMR) vaccine. Adults born  before 1957 generally are considered immune to measles and mumps. Adults born in [birth year] or later should have 1 or more doses of MMR vaccine unless there is a contraindication to the vaccine or there is laboratory evidence of immunity to each of the three diseases. A routine second dose of MMR vaccine should be obtained at least 28 days after the first dose for students attending postsecondary schools, health care workers, or international travelers. People who received inactivated measles vaccine or an unknown type of measles vaccine during 1963-1967 should receive 2 doses of MMR vaccine. People who received inactivated mumps vaccine or an unknown type of mumps vaccine before 1979 and are at high risk for mumps infection should consider immunization with 2 doses of MMR vaccine. For females of childbearing age, rubella immunity should be determined. If there is no evidence of immunity, females who are not pregnant should be vaccinated. If there is no evidence of immunity, females who are pregnant should delay immunization until after pregnancy. Unvaccinated health care workers born before [birth year] who lack laboratory evidence of measles, mumps, or rubella immunity or laboratory confirmation of disease should consider measles and mumps immunization with 2 doses of MMR vaccine or rubella immunization with 1 dose of MMR vaccine.  Pneumococcal 13-valent conjugate (PCV13) vaccine. When indicated, a person who is uncertain of his immunization history and has no record of immunization should receive the PCV13 vaccine. All adults 55 years of age and older should receive this  vaccine. An adult aged 9 years or older who has certain medical conditions and has not been previously immunized should receive 1 dose of PCV13 vaccine. This PCV13 should be followed with a dose of pneumococcal polysaccharide (PPSV23) vaccine. Adults who are at high risk for pneumococcal disease should obtain the PPSV23 vaccine at least 8 weeks after the dose of PCV13 vaccine. Adults older than 50 years of age who have normal immune system function should obtain the PPSV23 vaccine dose at least 1 year after the dose of PCV13 vaccine.  Pneumococcal polysaccharide (PPSV23) vaccine. When PCV13 is also indicated, PCV13 should be obtained first. All adults aged 41 years and older should be immunized. An adult younger than age 5 years who has certain medical conditions should be immunized. Any person who resides in a nursing home or long-term care facility should be immunized. An adult smoker should be immunized. People with an immunocompromised condition and certain other conditions should receive both PCV13 and PPSV23 vaccines. People with human immunodeficiency virus (HIV) infection should be immunized as soon as possible after diagnosis. Immunization during chemotherapy or radiation therapy should be avoided. Routine use of PPSV23 vaccine is not recommended for American Indians, [place] Natives, or people younger than 65 years unless there are medical conditions that require PPSV23 vaccine. When indicated, people who have unknown immunization and have no record of immunization should receive PPSV23 vaccine. One-time revaccination 5 years after the first dose of PPSV23 is recommended for people aged 19-64 years who have chronic kidney failure, nephrotic syndrome, asplenia, or immunocompromised conditions. People who received 1-2 doses of PPSV23 before age 43 years should receive another dose of PPSV23 vaccine at age 70 years or later if at least 5 years have passed since the previous dose. Doses of PPSV23 are not  needed for people immunized with PPSV23 at or after age 62 years.  Meningococcal vaccine. Adults with asplenia or persistent complement component deficiencies should receive 2 doses of quadrivalent meningococcal conjugate (MenACWY-D) vaccine. The doses should be obtained  at least 2 months apart. Microbiologists working with certain meningococcal bacteria, [place] recruits, people at risk during an outbreak, and people who travel to or live in countries with a high rate of meningitis should be immunized. A first-year college student up through age 34 years who is living in a residence hall should receive a dose if she did not receive a dose on or after her 16th birthday. Adults who have certain high-risk conditions should receive one or more doses of vaccine.  Hepatitis A vaccine. Adults who wish to be protected from this disease, have certain high-risk conditions, work with hepatitis A-infected animals, work in hepatitis A research labs, or travel to or work in countries with a high rate of hepatitis A should be immunized. Adults who were previously unvaccinated and who anticipate close contact with an international adoptee during the first 60 days after arrival in the [place] from a country with a high rate of hepatitis A should be immunized.  Hepatitis B vaccine. Adults who wish to be protected from this disease, have certain high-risk conditions, may be exposed to blood or other infectious body fluids, are household contacts or sex partners of hepatitis B positive people, are clients or workers in certain care facilities, or travel to or work in countries with a high rate of hepatitis B should be immunized.  Haemophilus influenzae type b (Hib) vaccine. A previously unvaccinated person with asplenia or sickle cell disease or having a scheduled splenectomy should receive 1 dose of Hib vaccine. Regardless of previous immunization, a recipient of a hematopoietic stem cell transplant should receive a  3-dose series 6-12 months after her successful transplant. Hib vaccine is not recommended for adults with HIV infection. Preventive Services / Frequency Ages 35 to 4 years  Blood pressure check.** / Every 3-5 years.  Lipid and cholesterol check.** / Every 5 years beginning at age 60.  Clinical breast exam.** / Every 3 years for women in their 71s and 10s.  BRCA-related cancer risk assessment.** / For women who have family members with a BRCA-related cancer (breast, ovarian, tubal, or peritoneal cancers).  Pap test.** / Every 2 years from ages 76 through 26. Every 3 years starting at age 61 through age 76 or 93 with a history of 3 consecutive normal Pap tests.  HPV screening.** / Every 3 years from ages 37 through ages 60 to 51 with a history of 3 consecutive normal Pap tests.  Hepatitis C blood test.** / For any individual with known risks for hepatitis C.  Skin self-exam. / Monthly.  Influenza vaccine. / Every year.  Tetanus, diphtheria, and acellular pertussis (Tdap, Td) vaccine.** / Consult your health care provider. Pregnant women should receive 1 dose of Tdap vaccine during each pregnancy. 1 dose of Td every 10 years.  Varicella vaccine.** / Consult your health care provider. Pregnant females who do not have evidence of immunity should receive the first dose after pregnancy.  HPV vaccine. / 3 doses over 6 months, if 93 and younger. The vaccine is not recommended for use in pregnant females. However, pregnancy testing is not needed before receiving a dose.  Measles, mumps, rubella (MMR) vaccine.** / You need at least 1 dose of MMR if you were born in 1957 or later. You may also need a 2nd dose. For females of childbearing age, rubella immunity should be determined. If there is no evidence of immunity, females who are not pregnant should be vaccinated. If there is no evidence of immunity, females who are  pregnant should delay immunization until after pregnancy.  Pneumococcal  13-valent conjugate (PCV13) vaccine.** / Consult your health care provider.  Pneumococcal polysaccharide (PPSV23) vaccine.** / 1 to 2 doses if you smoke cigarettes or if you have certain conditions.  Meningococcal vaccine.** / 1 dose if you are age 68 to 8 years and a Market researcher living in a residence hall, or have one of several medical conditions, you need to get vaccinated against meningococcal disease. You may also need additional booster doses.  Hepatitis A vaccine.** / Consult your health care provider.  Hepatitis B vaccine.** / Consult your health care provider.  Haemophilus influenzae type b (Hib) vaccine.** / Consult your health care provider. Ages 7 to 53 years  Blood pressure check.** / Every year.  Lipid and cholesterol check.** / Every 5 years beginning at age 25 years.  Lung cancer screening. / Every year if you are aged 11-80 years and have a 30-pack-year history of smoking and currently smoke or have quit within the past 15 years. Yearly screening is stopped once you have quit smoking for at least 15 years or develop a health problem that would prevent you from having lung cancer treatment.  Clinical breast exam.** / Every year after age 48 years.  BRCA-related cancer risk assessment.** / For women who have family members with a BRCA-related cancer (breast, ovarian, tubal, or peritoneal cancers).  Mammogram.** / Every year beginning at age 41 years and continuing for as long as you are in good health. Consult with your health care provider.  Pap test.** / Every 3 years starting at age 65 years through age 37 or 70 years with a history of 3 consecutive normal Pap tests.  HPV screening.** / Every 3 years from ages 72 years through ages 60 to 40 years with a history of 3 consecutive normal Pap tests.  Fecal occult blood test (FOBT) of stool. / Every year beginning at age 21 years and continuing until age 5 years. You may not need to do this test if you get  a colonoscopy every 10 years.  Flexible sigmoidoscopy or colonoscopy.** / Every 5 years for a flexible sigmoidoscopy or every 10 years for a colonoscopy beginning at age 35 years and continuing until age 48 years.  Hepatitis C blood test.** / For all people born from [birth year] through 1965 and any individual with known risks for hepatitis C.  Skin self-exam. / Monthly.  Influenza vaccine. / Every year.  Tetanus, diphtheria, and acellular pertussis (Tdap/Td) vaccine.** / Consult your health care provider. Pregnant women should receive 1 dose of Tdap vaccine during each pregnancy. 1 dose of Td every 10 years.  Varicella vaccine.** / Consult your health care provider. Pregnant females who do not have evidence of immunity should receive the first dose after pregnancy.  Zoster vaccine.** / 1 dose for adults aged 30 years or older.  Measles, mumps, rubella (MMR) vaccine.** / You need at least 1 dose of MMR if you were born in 1957 or later. You may also need a second dose. For females of childbearing age, rubella immunity should be determined. If there is no evidence of immunity, females who are not pregnant should be vaccinated. If there is no evidence of immunity, females who are pregnant should delay immunization until after pregnancy.  Pneumococcal 13-valent conjugate (PCV13) vaccine.** / Consult your health care provider.  Pneumococcal polysaccharide (PPSV23) vaccine.** / 1 to 2 doses if you smoke cigarettes or if you have certain conditions.  Meningococcal vaccine.** /  Consult your health care provider.  Hepatitis A vaccine.** / Consult your health care provider.  Hepatitis B vaccine.** / Consult your health care provider.  Haemophilus influenzae type b (Hib) vaccine.** / Consult your health care provider. Ages 57 years and over  Blood pressure check.** / Every year.  Lipid and cholesterol check.** / Every 5 years beginning at age 15 years.  Lung cancer screening. / Every year if you  are aged 22-80 years and have a 30-pack-year history of smoking and currently smoke or have quit within the past 15 years. Yearly screening is stopped once you have quit smoking for at least 15 years or develop a health problem that would prevent you from having lung cancer treatment.  Clinical breast exam.** / Every year after age 19 years.  BRCA-related cancer risk assessment.** / For women who have family members with a BRCA-related cancer (breast, ovarian, tubal, or peritoneal cancers).  Mammogram.** / Every year beginning at age 58 years and continuing for as long as you are in good health. Consult with your health care provider.  Pap test.** / Every 3 years starting at age 39 years through age 71 or 38 years with 3 consecutive normal Pap tests. Testing can be stopped between 65 and 70 years with 3 consecutive normal Pap tests and no abnormal Pap or HPV tests in the past 10 years.  HPV screening.** / Every 3 years from ages 33 years through ages 21 or 64 years with a history of 3 consecutive normal Pap tests. Testing can be stopped between 65 and 70 years with 3 consecutive normal Pap tests and no abnormal Pap or HPV tests in the past 10 years.  Fecal occult blood test (FOBT) of stool. / Every year beginning at age 71 years and continuing until age 37 years. You may not need to do this test if you get a colonoscopy every 10 years.  Flexible sigmoidoscopy or colonoscopy.** / Every 5 years for a flexible sigmoidoscopy or every 10 years for a colonoscopy beginning at age 31 years and continuing until age 92 years.  Hepatitis C blood test.** / For all people born from [birth year] through 1965 and any individual with known risks for hepatitis C.  Osteoporosis screening.** / A one-time screening for women ages 64 years and over and women at risk for fractures or osteoporosis.  Skin self-exam. / Monthly.  Influenza vaccine. / Every year.  Tetanus, diphtheria, and acellular pertussis (Tdap/Td)  vaccine.** / 1 dose of Td every 10 years.  Varicella vaccine.** / Consult your health care provider.  Zoster vaccine.** / 1 dose for adults aged 5 years or older.  Pneumococcal 13-valent conjugate (PCV13) vaccine.** / Consult your health care provider.  Pneumococcal polysaccharide (PPSV23) vaccine.** / 1 dose for all adults aged 62 years and older.  Meningococcal vaccine.** / Consult your health care provider.  Hepatitis A vaccine.** / Consult your health care provider.  Hepatitis B vaccine.** / Consult your health care provider.  Haemophilus influenzae type b (Hib) vaccine.** / Consult your health care provider. ** Family history and personal history of risk and conditions may change your health care provider's recommendations.   This information is not intended to replace advice given to you by your health care provider. Make sure you discuss any questions you have with your health care provider.   Document Released: 02/06/2002 Document Revised: 01/01/2015 Document Reviewed: 05/08/2011 Elsevier Interactive Patient Education [employer].

## 2016-10-17 NOTE — Progress Notes (Signed)
Subjective:     Lindsey [name] is a 50 y.o. female and is here for a comprehensive physical exam. The patient reports no problems.  Social History   Social History  . Marital status: Married    Spouse name: N/A  . Number of children: N/A  . Years of education: N/A   Occupational History  . Not on file.   Social History Main Topics  . Smoking status: Former Research scientist (life sciences)  . Smokeless tobacco: Never Used  . Alcohol use No  . Drug use: No  . Sexual activity: Yes   Other Topics Concern  . Not on file   Social History Narrative   Occupation: Babysit   Married   Regular exercise- yes   Health Maintenance  Topic Date Due  . COLONOSCOPY  09/26/2016  . HIV Screening  10/17/2017 (Originally 09/26/1981)  . MAMMOGRAM  06/14/2017  . PAP SMEAR  05/26/2019  . TETANUS/TDAP  05/14/2022  . INFLUENZA VACCINE  Completed    The following portions of the patient's history were reviewed and updated as appropriate:  She  has a past medical history of Hyperlipidemia; Hypertension; and Nephrolithiasis. She  does not have any pertinent problems on file. She  has a past surgical history that includes LEEP and Lithotripsy. Her family history includes Alzheimer's disease in her father; Dementia in her father. She  reports that she has quit smoking. She has never used smokeless tobacco. She reports that she does not drink alcohol or use drugs. She has a current medication list which includes the following prescription(s): albuterol, alprazolam, cetirizine, cyanocobalamin, ezetimibe, fluoxetine, fluticasone, levocetirizine, montelukast, na sulfate-k sulfate-mg sulf, omega-3 acid ethyl esters, potassium citrate, probiotic product, ranitidine, and valsartan. Current Outpatient Prescriptions on File Prior to Visit  Medication Sig Dispense Refill  . albuterol (PROVENTIL HFA;VENTOLIN HFA) 108 (90 Base) MCG/ACT inhaler Inhale 2 puffs into the lungs every 4 (four) hours as needed for wheezing or shortness of  breath. 1 Inhaler 2  . ALPRAZolam (XANAX) 0.5 MG tablet Take 1 tablet (0.5 mg total) by mouth as needed for anxiety. 10 tablet 0  . cetirizine (ZYRTEC) 10 MG tablet Take 10 mg by mouth daily.      . Cyanocobalamin (VITAMIN B 12 PO) Take by mouth.      . fluticasone (FLONASE) 50 MCG/ACT nasal spray TWO SPRAYS EACH NOSTRIL ONCE A DAY FOR NASAL CONGESTION OR DRAINAGE. 16 g 5  . levocetirizine (XYZAL) 5 MG tablet Take 1 tablet (5 mg total) by mouth every evening. 30 tablet 5  . montelukast (SINGULAIR) 10 MG tablet Take 1 tablet (10 mg total) by mouth at bedtime. 30 tablet 5  . Na Sulfate-K Sulfate-Mg Sulf 17.5-3.13-1.6 GM/180ML SOLN Suprep-Use as directed 354 mL 0  . omega-3 acid ethyl esters (LOVAZA) 1 g capsule Take 2 capsules (2 g total) by mouth 2 (two) times daily. 120 capsule 1  . potassium citrate (UROCIT-K) 10 MEQ (1080 MG) SR tablet Take 2 tablets by mouth daily.    . Probiotic Product (PROBIOTIC ADVANCED PO) Take 1 capsule by mouth daily. Reported on 06/08/2016    . ranitidine (ZANTAC) 150 MG tablet TAKE ONE TABLET ONE TO TWO TIMES A DAY AS DIRECTED. 60 tablet 5   No current facility-administered medications on file prior to visit.    She is allergic to macrobid [nitrofurantoin macrocrystal]..  Review of Systems Review of Systems  Constitutional: Negative for activity change, appetite change and fatigue.  HENT: Negative for hearing loss, congestion, tinnitus and ear discharge.  dentist q50mEyes: Negative for visual disturbance (see optho q1y -- vision corrected to 20/20 with glasses).  Respiratory: Negative for cough, chest tightness and shortness of breath.   Cardiovascular: Negative for chest pain, palpitations and leg swelling.  Gastrointestinal: Negative for abdominal pain, diarrhea, constipation and abdominal distention.  Genitourinary: Negative for urgency, frequency, decreased urine volume and difficulty urinating.  Musculoskeletal: Negative for back pain, arthralgias and gait  problem.  Skin: Negative for color change, pallor and rash.  Neurological: Negative for dizziness, light-headedness, numbness and headaches.  Hematological: Negative for adenopathy. Does not bruise/bleed easily.  Psychiatric/Behavioral: Negative for suicidal ideas, confusion, sleep disturbance, self-injury, dysphoric mood, decreased concentration and agitation.       Objective:    BP 119/80 (BP Location: Left Arm, Patient Position: Sitting, Cuff Size: Normal)   Pulse 77   Temp 97.9 F (36.6 C) (Oral)   Ht 5' 4"  (1.626 m)   Wt 211 lb 9.6 oz (96 kg)   LMP 10/10/2016   SpO2 97%   BMI 36.32 kg/m  General appearance: alert, cooperative, appears stated age and no distress Head: Normocephalic, without obvious abnormality, atraumatic Eyes: conjunctivae/corneas clear. PERRL, EOM's intact. Fundi benign. Ears: normal TM's and external ear canals both ears Nose: Nares normal. Septum midline. Mucosa normal. No drainage or sinus tenderness. Throat: lips, mucosa, and tongue normal; teeth and gums normal Neck: no adenopathy, no carotid bruit, no JVD, supple, symmetrical, trachea midline and thyroid not enlarged, symmetric, no tenderness/mass/nodules Back: symmetric, no curvature. ROM normal. No CVA tenderness. Lungs: clear to auscultation bilaterally Breasts: gyn Heart: regular rate and rhythm, S1, S2 normal, no murmur, click, rub or gallop Abdomen: soft, non-tender; bowel sounds normal; no masses,  no organomegaly Pelvic: deferredgyn Extremities: extremities normal, atraumatic, no cyanosis or edema Pulses: 2+ and symmetric Skin: Skin color, texture, turgor normal. No rashes or lesions Lymph nodes: Cervical, supraclavicular, and axillary nodes normal. Neurologic: Alert and oriented X 3, normal strength and tone. Normal symmetric reflexes. Normal coordination and gait    Assessment:    Healthy female exam.      Plan:    ghm utd Check labs See After Visit Summary for Counseling  Recommendations    1. Preventative health care See above - Comprehensive metabolic panel; Future - Lipid panel; Future - CBC with Differential/Platelet; Future - POCT urinalysis dipstick; Future - TSH; Future - Vitamin B12; Future - Ambulatory referral to Gastroenterology  2. Hyperlipidemia, unspecified hyperlipidemia type con't zetia and fenofibrate - Comprehensive metabolic panel; Future - Lipid panel; Future  3. Hypertension, unspecified type stable - Comprehensive metabolic panel; Future - CBC with Differential/Platelet; Future - POCT urinalysis dipstick; Future  4. B12 deficiency   - Vitamin B12; Future  5. Hyperglycemia    - Hemoglobin A1c; Future  6. Need for prophylactic vaccination and inoculation against influenza   - Flu Vaccine QUAD 36+ mos PF IM (Fluarix & Fluzone Quad PF)

## 2016-10-18 ENCOUNTER — Encounter: Payer: Self-pay | Admitting: Family Medicine

## 2016-10-18 ENCOUNTER — Encounter: Payer: Self-pay | Admitting: Internal Medicine

## 2016-10-19 NOTE — Telephone Encounter (Signed)
Please get her an appointment

## 2016-10-22 MED ORDER — FLUOXETINE HCL 20 MG PO CAPS: ORAL_CAPSULE | ORAL | 0 refills | Status: DC

## 2016-10-22 MED ORDER — EZETIMIBE 10 MG PO TABS: ORAL_TABLET | ORAL | 0 refills | Status: DC

## 2016-10-24 ENCOUNTER — Ambulatory Visit (INDEPENDENT_AMBULATORY_CARE_PROVIDER_SITE_OTHER): Payer: BLUE CROSS/BLUE SHIELD | Admitting: Family Medicine

## 2016-10-24 ENCOUNTER — Encounter: Payer: Self-pay | Admitting: Family Medicine

## 2016-10-24 ENCOUNTER — Other Ambulatory Visit (INDEPENDENT_AMBULATORY_CARE_PROVIDER_SITE_OTHER): Payer: BLUE CROSS/BLUE SHIELD

## 2016-10-24 VITALS — BP 115/76 | HR 72 | Temp 98.3°F | Ht 64.0 in | Wt 209.8 lb

## 2016-10-24 DIAGNOSIS — I10 Essential (primary) hypertension: Secondary | ICD-10-CM

## 2016-10-24 DIAGNOSIS — R739 Hyperglycemia, unspecified: Secondary | ICD-10-CM | POA: Diagnosis not present

## 2016-10-24 DIAGNOSIS — E785 Hyperlipidemia, unspecified: Secondary | ICD-10-CM | POA: Diagnosis not present

## 2016-10-24 DIAGNOSIS — E538 Deficiency of other specified B group vitamins: Secondary | ICD-10-CM | POA: Diagnosis not present

## 2016-10-24 DIAGNOSIS — E669 Obesity, unspecified: Secondary | ICD-10-CM

## 2016-10-24 DIAGNOSIS — R319 Hematuria, unspecified: Secondary | ICD-10-CM

## 2016-10-24 DIAGNOSIS — Z0000 Encounter for general adult medical examination without abnormal findings: Secondary | ICD-10-CM | POA: Diagnosis not present

## 2016-10-24 LAB — HEMOGLOBIN A1C: Hgb A1c MFr Bld: 5.8 % (ref 4.6–6.5)

## 2016-10-24 LAB — CBC WITH DIFFERENTIAL/PLATELET
Basophils Absolute: 0.1 10*3/uL (ref 0.0–0.1)
Basophils Relative: 0.6 % (ref 0.0–3.0)
Eosinophils Absolute: 0.3 10*3/uL (ref 0.0–0.7)
Eosinophils Relative: 2.7 % (ref 0.0–5.0)
HCT: 40.9 % (ref 36.0–46.0)
Hemoglobin: 13.6 g/dL (ref 12.0–15.0)
Lymphocytes Relative: 30 % (ref 12.0–46.0)
Lymphs Abs: 2.9 10*3/uL (ref 0.7–4.0)
MCHC: 33.4 g/dL (ref 30.0–36.0)
MCV: 86.3 fl (ref 78.0–100.0)
Monocytes Absolute: 0.7 10*3/uL (ref 0.1–1.0)
Monocytes Relative: 7.5 % (ref 3.0–12.0)
Neutro Abs: 5.7 10*3/uL (ref 1.4–7.7)
Neutrophils Relative %: 59.2 % (ref 43.0–77.0)
Platelets: 267 10*3/uL (ref 150.0–400.0)
RBC: 4.74 Mil/uL (ref 3.87–5.11)
RDW: 13.4 % (ref 11.5–15.5)
WBC: 9.6 10*3/uL (ref 4.0–10.5)

## 2016-10-24 LAB — LIPID PANEL
Cholesterol: 198 mg/dL (ref 0–200)
HDL: 63.4 mg/dL (ref >39.00)
LDL Cholesterol: 115 mg/dL — ABNORMAL HIGH (ref 0–99)
NonHDL: 134.21
Total CHOL/HDL Ratio: 3
Triglycerides: 94 mg/dL (ref 0.0–149.0)
VLDL: 18.8 mg/dL (ref 0.0–40.0)

## 2016-10-24 LAB — POCT URINALYSIS DIPSTICK
Bilirubin, UA: NEGATIVE
Glucose, UA: NEGATIVE
Ketones, UA: NEGATIVE
Leukocytes, UA: NEGATIVE
Nitrite, UA: NEGATIVE
Spec Grav, UA: >=1.03
Urobilinogen, UA: 0.2
pH, UA: 6

## 2016-10-24 LAB — COMPREHENSIVE METABOLIC PANEL
ALT: 26 U/L (ref 0–35)
AST: 29 U/L (ref 0–37)
Albumin: 4.2 g/dL (ref 3.5–5.2)
Alkaline Phosphatase: 68 U/L (ref 39–117)
BUN: 19 mg/dL (ref 6–23)
CO2: 27 mEq/L (ref 19–32)
Calcium: 9.5 mg/dL (ref 8.4–10.5)
Chloride: 102 mEq/L (ref 96–112)
Creatinine, Ser: 0.72 mg/dL (ref 0.40–1.20)
GFR: 91.1 mL/min (ref >60.00)
Glucose, Bld: 106 mg/dL — ABNORMAL HIGH (ref 70–99)
Potassium: 4 mEq/L (ref 3.5–5.1)
Sodium: 139 mEq/L (ref 135–145)
Total Bilirubin: 0.5 mg/dL (ref 0.2–1.2)
Total Protein: 7.3 g/dL (ref 6.0–8.3)

## 2016-10-24 LAB — TSH: TSH: 2 u[IU]/mL (ref 0.35–4.50)

## 2016-10-24 LAB — VITAMIN B12: Vitamin B-12: 629 pg/mL (ref 211–911)

## 2016-10-24 MED ORDER — INSULIN PEN NEEDLE 32G X 6 MM MISC: 11 refills | Status: DC

## 2016-10-24 MED ORDER — LIRAGLUTIDE -WEIGHT MANAGEMENT 18 MG/3ML ~~LOC~~ SOPN: 3.0000 mg | PEN_INJECTOR | Freq: Every day | SUBCUTANEOUS | 5 refills | Status: DC

## 2016-10-24 NOTE — Patient Instructions (Signed)

## 2016-10-24 NOTE — Progress Notes (Signed)
Patient ID: Lindsey [name], female    DOB: [birth year]  Age: 50 y.o. MRN: [id number]    Subjective:  Subjective  HPI Lindsey [name] presents for f/u weight loss.  She has been going to weight watchers for a long time and feels like she needs something more.   Her ins will cover saxenda and she is interested in starting that.     Review of Systems  Constitutional: Negative for appetite change, diaphoresis, fatigue and unexpected weight change.  Eyes: Negative for pain, redness and visual disturbance.  Respiratory: Negative for cough, chest tightness, shortness of breath and wheezing.   Cardiovascular: Negative for chest pain, palpitations and leg swelling.  Endocrine: Negative for cold intolerance, heat intolerance, polydipsia, polyphagia and polyuria.  Genitourinary: Negative for difficulty urinating, dysuria and frequency.  Neurological: Negative for dizziness, light-headedness, numbness and headaches.    History Past Medical History:  Diagnosis Date  . Hyperlipidemia   . Hypertension   . Nephrolithiasis     She has a past surgical history that includes LEEP and Lithotripsy.   Her family history includes Alzheimer's disease in her father; Dementia in her father.She reports that she has quit smoking. She has never used smokeless tobacco. She reports that she does not drink alcohol or use drugs.  Current Outpatient Prescriptions on File Prior to Visit  Medication Sig Dispense Refill  . albuterol (PROVENTIL HFA;VENTOLIN HFA) 108 (90 Base) MCG/ACT inhaler Inhale 2 puffs into the lungs every 4 (four) hours as needed for wheezing or shortness of breath. 1 Inhaler 2  . ALPRAZolam (XANAX) 0.5 MG tablet Take 1 tablet (0.5 mg total) by mouth as needed for anxiety. 10 tablet 0  . cetirizine (ZYRTEC) 10 MG tablet Take 10 mg by mouth daily.      . Cyanocobalamin (VITAMIN B 12 PO) Take by mouth.      . ezetimibe (ZETIA) 10 MG tablet TAKE ONE (1) TABLET BY MOUTH EVERY DAY. FASTING LABS ARE  DUE NOW 30 tablet 0  . FLUoxetine (PROZAC) 20 MG capsule TAKE ONE (1) CAPSULE EACH DAY 30 capsule 0  . fluticasone (FLONASE) 50 MCG/ACT nasal spray TWO SPRAYS EACH NOSTRIL ONCE A DAY FOR NASAL CONGESTION OR DRAINAGE. 16 g 5  . levocetirizine (XYZAL) 5 MG tablet Take 1 tablet (5 mg total) by mouth every evening. 30 tablet 5  . montelukast (SINGULAIR) 10 MG tablet Take 1 tablet (10 mg total) by mouth at bedtime. 30 tablet 5  . Na Sulfate-K Sulfate-Mg Sulf 17.5-3.13-1.6 GM/180ML SOLN Suprep-Use as directed 354 mL 0  . omega-3 acid ethyl esters (LOVAZA) 1 g capsule Take 2 capsules (2 g total) by mouth 2 (two) times daily. 120 capsule 1  . potassium citrate (UROCIT-K) 10 MEQ (1080 MG) SR tablet Take 2 tablets by mouth daily.    . Probiotic Product (PROBIOTIC ADVANCED PO) Take 1 capsule by mouth daily. Reported on 06/08/2016    . ranitidine (ZANTAC) 150 MG tablet TAKE ONE TABLET ONE TO TWO TIMES A DAY AS DIRECTED. 60 tablet 5  . valsartan (DIOVAN) 320 MG tablet TAKE ONE (1) TABLET BY MOUTH EVERY DAY. OFFICE VISIT DUE NOW 30 tablet 0   No current facility-administered medications on file prior to visit.      Objective:  Objective  Physical Exam  Constitutional: She is oriented to person, place, and time. She appears well-developed and well-nourished.  HENT:  Head: Normocephalic and atraumatic.  Eyes: Conjunctivae and EOM are normal.  Neck: Normal range of motion. Neck supple.  No JVD present. Carotid bruit is not present. No thyromegaly present.  Cardiovascular: Normal rate, regular rhythm and normal heart sounds.   No murmur heard. Pulmonary/Chest: Effort normal and breath sounds normal. No respiratory distress. She has no wheezes. She has no rales. She exhibits no tenderness.  Musculoskeletal: She exhibits no edema.  Neurological: She is alert and oriented to person, place, and time.  Psychiatric: She has a normal mood and affect. Her behavior is normal. Judgment and thought content normal.    Nursing note and vitals reviewed.  BP 115/76 (BP Location: Left Arm, Patient Position: Sitting, Cuff Size: Large)   Pulse 72   Temp 98.3 F (36.8 C) (Oral)   Ht 5' 4"  (1.626 m)   Wt 209 lb 12.8 oz (95.2 kg)   LMP 10/10/2016   SpO2 97%   BMI 36.01 kg/m  Wt Readings from Last 3 Encounters:  10/24/16 209 lb 12.8 oz (95.2 kg)  10/17/16 211 lb 9.6 oz (96 kg)  06/08/16 202 lb 9.6 oz (91.9 kg)     Lab Results  Component Value Date   WBC 9.9 05/29/2016   HGB 13.5 05/29/2016   HCT 41.0 05/29/2016   PLT 250.0 05/29/2016   GLUCOSE 103 (H) 05/24/2016   CHOL 189 11/12/2015   TRIG 153 (H) 11/12/2015   HDL 53 11/12/2015   LDLDIRECT 123.2 12/20/2011   LDLCALC 105 11/12/2015   ALT 17 05/24/2016   ALT 17 05/24/2016   AST 15 05/24/2016   AST 15 05/24/2016   NA 138 05/24/2016   K 4.1 05/24/2016   CL 104 05/24/2016   CREATININE 0.73 05/24/2016   BUN 16 05/24/2016   CO2 28 05/24/2016   TSH 1.22 05/14/2014   HGBA1C 5.9 12/20/2011    Mm Diag Breast Tomo Bilateral  Result Date: 06/14/2016 CLINICAL DATA:  Re-evaluation probably benign finding within the left breast and annual re-evaluation right breast. EXAM: 2D DIGITAL DIAGNOSTIC BILATERAL MAMMOGRAM WITH CAD AND ADJUNCT TOMO COMPARISON:  Previous exam(s). ACR Breast Density Category b: There are scattered areas of fibroglandular density. FINDINGS: The small nodular focus previously seen within the left breast has decreased in size consistent with a benign finding. The breast parenchymal pattern is otherwise unchanged and the right breast is stable. Mammographic images were processed with CAD. IMPRESSION: Interval decrease in size of a small benign nodular focus within the central left breast. Recommend return to screening mammography in 1 year. RECOMMENDATION: Bilateral screening mammography in 1 year. I have discussed the findings and recommendations with the patient. Results were also provided in writing at the conclusion of the visit. If  applicable, a reminder letter will be sent to the patient regarding the next appointment. BI-RADS CATEGORY  2: Benign. Electronically Signed   By: Altamese Cabal M.D.   On: 06/14/2016 17:23     Assessment & Plan:  Plan  I am having Ms. Deleon start on Liraglutide -Weight Management and Insulin Pen Needle. I am also having her maintain her Cyanocobalamin (VITAMIN B 12 PO), cetirizine, potassium citrate, ALPRAZolam, omega-3 acid ethyl esters, Probiotic Product (PROBIOTIC ADVANCED PO), Na Sulfate-K Sulfate-Mg Sulf, montelukast, levocetirizine, ranitidine, albuterol, fluticasone, valsartan, FLUoxetine, and ezetimibe.  Meds ordered this encounter  Medications  . Liraglutide -Weight Management (SAXENDA) 18 MG/3ML SOPN    Sig: Inject 3 mg into the skin daily.    Dispense:  3 mL    Refill:  5  . Insulin Pen Needle (NOVOFINE) 32G X 6 MM MISC    Sig: Use daily  Dispense:  100 each    Refill:  11    Problem List Items Addressed This Visit      Unprioritized   Essential hypertension    Stable , con diovan      Obesity (BMI 30-39.9) - Primary    saxenda--- start up at 0.6 mg and work up to 3.0 mg Discussed diet and exercise con't weight watchers      Relevant Medications   Liraglutide -Weight Management (SAXENDA) 18 MG/3ML SOPN   Insulin Pen Needle (NOVOFINE) 32G X 6 MM MISC    Other Visit Diagnoses   None.     Follow-up: Return in about 3 months (around 01/24/2017).  Ann Held, DO

## 2016-10-24 NOTE — Assessment & Plan Note (Signed)
Stable , con diovan

## 2016-10-24 NOTE — Assessment & Plan Note (Signed)
saxenda--- start up at 0.6 mg and work up to 3.0 mg Discussed diet and exercise con't weight watchers

## 2016-10-24 NOTE — Addendum Note (Signed)
Addended by: Caffie Pinto on: 10/24/2016 08:53 AM   Modules accepted: Orders

## 2016-10-24 NOTE — Progress Notes (Signed)
Pre visit review using our clinic review tool, if applicable. No additional management support is needed unless otherwise documented below in the visit note. 

## 2016-10-25 LAB — URINE CULTURE

## 2016-10-27 ENCOUNTER — Encounter: Payer: Self-pay | Admitting: Family Medicine

## 2016-10-31 ENCOUNTER — Encounter: Payer: Self-pay | Admitting: Family Medicine

## 2016-11-01 NOTE — Telephone Encounter (Signed)
°  Caller Name: Georgina Snell  Relation to [place]:XYVO PA department  Call back number: [phone] reference # [id number]   Reason for call:  [employer] has a few questions regarding Saxenda PA:  1. What is patient BMI  2. Office notes supporting dietary and exercise counseling  3. Has patient tried Belviq or Qsymia

## 2016-11-09 NOTE — Telephone Encounter (Signed)
Ok to give sample

## 2016-11-10 ENCOUNTER — Telehealth: Payer: Self-pay | Admitting: Family Medicine

## 2016-11-10 NOTE — Telephone Encounter (Signed)
[place] to confirm request for Rx and pharmacist stated he doesn't know why the request for the Rx came over. Refused because it is too early for refill last fill 10/24/16 #100 each 11 refills. LB

## 2016-11-10 NOTE — Telephone Encounter (Signed)
Caller name: Relationship to patient: Self Can be reached: [phone]  Pharmacy:  [place], [place] - [address] [phone] (Phone) [phone] (Fax)     Reason for call: Needs refill on Insulin Pen Needle (NOVOFINE) 32G X 6 MM MISC [[id number]

## 2016-11-22 ENCOUNTER — Other Ambulatory Visit: Payer: Self-pay

## 2016-11-22 DIAGNOSIS — E785 Hyperlipidemia, unspecified: Secondary | ICD-10-CM

## 2016-11-22 MED ORDER — OMEGA-3-ACID ETHYL ESTERS 1 G PO CAPS: 2.0000 | ORAL_CAPSULE | Freq: Two times a day (BID) | ORAL | 1 refills | Status: DC

## 2016-11-22 MED ORDER — EZETIMIBE 10 MG PO TABS: ORAL_TABLET | ORAL | 0 refills | Status: DC

## 2016-11-22 MED ORDER — VALSARTAN 320 MG PO TABS: ORAL_TABLET | ORAL | 0 refills | Status: DC

## 2016-11-22 NOTE — Telephone Encounter (Signed)
Please advise in regards to the Potassium Citrate 10MEQ. Instructions state to take 2 tabs po qd. I'm unsure of the quantity, refills OR if the Pt is to even still be taking this medication.

## 2016-11-23 ENCOUNTER — Encounter: Payer: Self-pay | Admitting: Family Medicine

## 2016-11-27 MED ORDER — VALSARTAN 320 MG PO TABS: ORAL_TABLET | ORAL | 5 refills | Status: DC

## 2016-11-27 MED ORDER — EZETIMIBE 10 MG PO TABS: ORAL_TABLET | ORAL | 1 refills | Status: DC

## 2016-11-27 MED ORDER — OMEGA-3-ACID ETHYL ESTERS 1 G PO CAPS: 2.0000 | ORAL_CAPSULE | Freq: Two times a day (BID) | ORAL | 1 refills | Status: DC

## 2016-11-27 MED ORDER — POTASSIUM CITRATE ER 10 MEQ (1080 MG) PO TBCR: 20.0000 meq | EXTENDED_RELEASE_TABLET | Freq: Every day | ORAL | 5 refills | Status: DC

## 2016-11-27 NOTE — Addendum Note (Signed)
Addended by: Sharen Counter D on: 11/27/2016 04:57 PM   Modules accepted: Orders

## 2016-11-28 ENCOUNTER — Ambulatory Visit: Payer: Self-pay | Admitting: Family Medicine

## 2016-11-28 ENCOUNTER — Ambulatory Visit: Payer: BLUE CROSS/BLUE SHIELD | Admitting: *Deleted

## 2016-11-28 VITALS — Ht 64.0 in | Wt 207.0 lb

## 2016-11-28 DIAGNOSIS — Z1211 Encounter for screening for malignant neoplasm of colon: Secondary | ICD-10-CM

## 2016-11-28 NOTE — Progress Notes (Signed)
Patient denies any allergies to egg or soy products. Patient denies complications with anesthesia/sedation.  Patient denies oxygen use at home and denies diet medications. Emmi instructions for colonoscopy explained and pamphlet given.  Patient had colonoscopy schedule in 05/2016.   Patient delayed procedure.  Patient has suprep at home.

## 2016-12-01 ENCOUNTER — Encounter: Payer: Self-pay | Admitting: Internal Medicine

## 2016-12-08 ENCOUNTER — Encounter: Payer: Self-pay | Admitting: Family Medicine

## 2016-12-12 ENCOUNTER — Encounter: Payer: Self-pay | Admitting: Internal Medicine

## 2016-12-12 ENCOUNTER — Ambulatory Visit (AMBULATORY_SURGERY_CENTER): Payer: BLUE CROSS/BLUE SHIELD | Admitting: Internal Medicine

## 2016-12-12 VITALS — BP 128/77 | HR 62 | Temp 98.7°F | Resp 17 | Ht 64.0 in | Wt 207.0 lb

## 2016-12-12 DIAGNOSIS — K529 Noninfective gastroenteritis and colitis, unspecified: Secondary | ICD-10-CM | POA: Diagnosis not present

## 2016-12-12 DIAGNOSIS — K5190 Ulcerative colitis, unspecified, without complications: Secondary | ICD-10-CM | POA: Diagnosis not present

## 2016-12-12 DIAGNOSIS — Z1211 Encounter for screening for malignant neoplasm of colon: Secondary | ICD-10-CM | POA: Diagnosis present

## 2016-12-12 DIAGNOSIS — Z1212 Encounter for screening for malignant neoplasm of rectum: Secondary | ICD-10-CM

## 2016-12-12 MED ORDER — SODIUM CHLORIDE 0.9 % IV SOLN: 500.0000 mL | INTRAVENOUS | Status: DC

## 2016-12-12 NOTE — Patient Instructions (Signed)
YOU HAD AN ENDOSCOPIC PROCEDURE TODAY AT [facility]:   Refer to the procedure report that was given to you for any specific questions about what was found during the examination.  If the procedure report does not answer your questions, please call your gastroenterologist to clarify.  If you requested that your care partner not be given the details of your procedure findings, then the procedure report has been included in a sealed envelope for you to review at your convenience later.  YOU SHOULD EXPECT: Some feelings of bloating in the abdomen. Passage of more gas than usual.  Walking can help get rid of the air that was put into your GI tract during the procedure and reduce the bloating. If you had a lower endoscopy (such as a colonoscopy or flexible sigmoidoscopy) you may notice spotting of blood in your stool or on the toilet paper. If you underwent a bowel prep for your procedure, you may not have a normal bowel movement for a few days.  Please Note:  You might notice some irritation and congestion in your nose or some drainage.  This is from the oxygen used during your procedure.  There is no need for concern and it should clear up in a day or so.  SYMPTOMS TO REPORT IMMEDIATELY:   Following lower endoscopy (colonoscopy or flexible sigmoidoscopy):  Excessive amounts of blood in the stool  Significant tenderness or worsening of abdominal pains  Swelling of the abdomen that is new, acute  Fever of 100F or higher  For urgent or emergent issues, a gastroenterologist can be reached at any hour by calling [phone].   DIET:  We do recommend a small meal at first, but then you may proceed to your regular diet.  Drink plenty of fluids but you should avoid alcoholic beverages for 24 hours.  ACTIVITY:  You should plan to take it easy for the rest of today and you should NOT DRIVE or use heavy machinery until tomorrow (because of the sedation medicines used during the test).     FOLLOW UP: Our staff will call the number listed on your records the next business day following your procedure to check on you and address any questions or concerns that you may have regarding the information given to you following your procedure. If we do not reach you, we will leave a message.  However, if you are feeling well and you are not experiencing any problems, there is no need to return our call.  We will assume that you have returned to your regular daily activities without incident.  If any biopsies were taken you will be contacted by phone or by letter within the next 1-3 weeks.  Please call us at [phone] if you have not heard about the biopsies in 3 weeks.    SIGNATURES/CONFIDENTIALITY: You and/or your care partner have signed paperwork which will be entered into your electronic medical record.  These signatures attest to the fact that that the information above on your After Visit Summary has been reviewed and is understood.  Full responsibility of the confidentiality of this discharge information lies with you and/or your care-partner.  No Aspirin, Ibuprofen, Naproxen, or other non-steriodal anti-inflammatory drugs. Await for biopsy results to determined next Colonoscopy

## 2016-12-12 NOTE — Progress Notes (Signed)
Report to PACU, RN, vss, BBS= Clear.  

## 2016-12-12 NOTE — Op Note (Signed)
[facility] Patient Name: Lindsey [name] Procedure Date: 12/12/2016 2:21 PM MRN: [id number] Endoscopist: Jerene Bears , MD Age: 50 Referring MD:  Date of Birth: 1965-12-26 Gender: Female Account #: [id number] Procedure:                Colonoscopy Indications:              Screening for colorectal malignant neoplasm, This                            is the patient's first colonoscopy Medicines:                Monitored Anesthesia Care Procedure:                Pre-Anesthesia Assessment:                           - Prior to the procedure, a History and Physical                            was performed, and patient medications and                            allergies were reviewed. The patient's tolerance of                            previous anesthesia was also reviewed. The risks                            and benefits of the procedure and the sedation                            options and risks were discussed with the patient.                            All questions were answered, and informed consent                            was obtained. Prior Anticoagulants: The patient has                            taken no previous anticoagulant or antiplatelet                            agents. ASA Grade Assessment: II - A patient with                            mild systemic disease. After reviewing the risks                            and benefits, the patient was deemed in                            satisfactory condition to undergo the procedure.  After obtaining informed consent, the colonoscope                            was passed under direct vision. Throughout the                            procedure, the patient's blood pressure, pulse, and                            oxygen saturations were monitored continuously. The                            Model PCF-[id number]) scope was introduced                            through the anus and  advanced to the the terminal                            ileum. The colonoscopy was performed without                            difficulty. The patient tolerated the procedure                            well. The quality of the bowel preparation was                            good. The terminal ileum, ileocecal valve,                            appendiceal orifice, and rectum were photographed. Scope In: 2:34:50 PM Scope Out: 2:49:56 PM Scope Withdrawal Time: 0 hours 11 minutes 59 seconds  Total Procedure Duration: 0 hours 15 minutes 6 seconds  Findings:                 The digital rectal exam was normal.                           The terminal ileum appeared normal.                           Discontinuous areas of patchy, nonbleeding,                            ulcerated mucosa were present in the sigmoid colon,                            in the descending colon, at the splenic flexure, in                            the transverse colon, at the hepatic flexure, in                            the ascending colon and at the ileocecal valve.  This was characterized by erythema and ulceration.                            Multiple biopsies were obtained with cold forceps                            for histology in a targeted manner in 3 jars                            (ICV/ascending, hepatic flexure, splenic                            flexure/descending). Outside of the discrete areas                            of ulceration the remaining colonic mucosa appeared                            normal.                           A few small-mouthed diverticula were found in the                            sigmoid colon.                           Anal papilla(e) were hypertrophied on retroflexed                            views in the rectum. Complications:            No immediate complications. Estimated Blood Loss:     Estimated blood loss was minimal. Impression:                - The examined portion of the ileum was normal.                           - Mucosal ulceration scattered from IC valve to                            mid-sigmoid colon. Biopsied.                           - Diverticulosis in the sigmoid colon.                           - Anal papillae were hypertrophied. Recommendation:           - Patient has a contact number available for                            emergencies. The signs and symptoms of potential                            delayed complications were discussed with the  patient. Return to normal activities tomorrow.                            Written discharge instructions were provided to the                            patient.                           - Resume previous diet.                           - Continue present medications.                           - Avoid aspirin, ibuprofen, naproxen, or other                            non-steroidal anti-inflammatory drugs.                           - Await pathology results.                           - Repeat colonoscopy is recommended. The                            colonoscopy date will be determined after pathology                            results from today's exam become available for                            review. Jerene Bears, MD 12/12/2016 2:58:18 PM This report has been signed electronically.

## 2016-12-12 NOTE — Progress Notes (Signed)
Called to room to assist during endoscopic procedure.  Patient ID and intended procedure confirmed with present staff. Received instructions for my participation in the procedure from the performing physician.  

## 2016-12-13 ENCOUNTER — Telehealth: Payer: Self-pay

## 2016-12-13 NOTE — Telephone Encounter (Signed)
Name identifier. Left message will call back later today.

## 2016-12-19 ENCOUNTER — Other Ambulatory Visit: Payer: Self-pay

## 2016-12-19 MED ORDER — MESALAMINE 1.2 G PO TBEC: 2.4000 g | DELAYED_RELEASE_TABLET | Freq: Two times a day (BID) | ORAL | 3 refills | Status: DC

## 2016-12-29 ENCOUNTER — Other Ambulatory Visit: Payer: Self-pay | Admitting: Family Medicine

## 2016-12-29 DIAGNOSIS — E785 Hyperlipidemia, unspecified: Secondary | ICD-10-CM

## 2016-12-29 DIAGNOSIS — F411 Generalized anxiety disorder: Secondary | ICD-10-CM

## 2016-12-29 MED ORDER — FLUOXETINE HCL 20 MG PO CAPS: ORAL_CAPSULE | ORAL | 0 refills | Status: DC

## 2016-12-29 MED ORDER — EZETIMIBE 10 MG PO TABS: ORAL_TABLET | ORAL | 0 refills | Status: DC

## 2016-12-29 NOTE — Telephone Encounter (Signed)
Caller name: Relationship to patient: Self Can be reached: [phone]  Pharmacy:  [facility], [place] - [address] [phone] (Phone) [phone] (Fax)     Reason for call: Refill on FLUoxetine (PROZAC) 20 MG capsule  ezetimibe (ZETIA) 10 MG tablet [[id number]

## 2016-12-29 NOTE — Telephone Encounter (Signed)
Refills done as requested to [facility] the patient left a detailed message refills are done

## 2017-01-15 ENCOUNTER — Encounter: Payer: Self-pay | Admitting: Family Medicine

## 2017-01-15 ENCOUNTER — Other Ambulatory Visit: Payer: Self-pay | Admitting: Family Medicine

## 2017-01-15 MED ORDER — ALPRAZOLAM 0.5 MG PO TABS
0.5000 mg | ORAL_TABLET | ORAL | 0 refills | Status: DC | PRN
Start: 2017-01-15 — End: 2020-02-01

## 2017-01-15 NOTE — Telephone Encounter (Signed)
Faxed hardcopy for Alprazolam to [facility].

## 2017-01-15 NOTE — Telephone Encounter (Signed)
Last refill  #10 with 0 refills on 08/19/2015 Last office visit  10/24/2016

## 2017-01-16 ENCOUNTER — Encounter: Payer: Self-pay | Admitting: *Deleted

## 2017-01-25 ENCOUNTER — Ambulatory Visit (INDEPENDENT_AMBULATORY_CARE_PROVIDER_SITE_OTHER): Payer: BLUE CROSS/BLUE SHIELD | Admitting: Family Medicine

## 2017-01-25 VITALS — BP 120/70 | HR 66 | Temp 98.3°F | Resp 16 | Ht 64.0 in | Wt 202.0 lb

## 2017-01-25 DIAGNOSIS — F411 Generalized anxiety disorder: Secondary | ICD-10-CM | POA: Diagnosis not present

## 2017-01-25 DIAGNOSIS — I10 Essential (primary) hypertension: Secondary | ICD-10-CM | POA: Diagnosis not present

## 2017-01-25 DIAGNOSIS — E785 Hyperlipidemia, unspecified: Secondary | ICD-10-CM | POA: Diagnosis not present

## 2017-01-25 DIAGNOSIS — K5010 Crohn's disease of large intestine without complications: Secondary | ICD-10-CM

## 2017-01-25 MED ORDER — EZETIMIBE 10 MG PO TABS: ORAL_TABLET | ORAL | 1 refills | Status: DC

## 2017-01-25 MED ORDER — FLUOXETINE HCL 20 MG PO CAPS: ORAL_CAPSULE | ORAL | 3 refills | Status: DC

## 2017-01-25 NOTE — Progress Notes (Signed)
Subjective:  I acted as a Education administrator for Dr. Carollee Herter.  Guerry Bruin, [place]   Patient ID: Lindsey [name], female    DOB: Feb 16, 1966, 51 y.o.   MRN: [id number]  Chief Complaint  Patient presents with  . Follow-up    obesity-med not covered by insurance did not start, blood pressure, cholesterol, and anxiety    HPI Patient is in today for follow up blood pressure, cholesterol, anxiety, and obesity.  Patient did not start [place] because it was not covered by insurance.  Past Medical History:  Diagnosis Date  . Allergy   . Anxiety   . Colitis   . Depression   . Diverticulosis   . Hyperlipidemia   . Hypertension   . Mild intermittent asthma    rarely uses inhaler  . Nephrolithiasis     Past Surgical History:  Procedure Laterality Date  . COMBINED HYSTEROSCOPY DIAGNOSTIC / D&C    . LEEP    . LITHOTRIPSY    . WISDOM TOOTH EXTRACTION      Family History  Problem Relation Age of Onset  . Dementia Father   . Alzheimer's disease Father   . Hyperlipidemia    . Hypertension    . Diabetes    . Crohn's disease Neg Hx   . Esophageal cancer Neg Hx   . Stomach cancer Neg Hx   . Rectal cancer Neg Hx     Social History   Social History  . Marital status: Married    Spouse name: N/A  . Number of children: N/A  . Years of education: N/A   Occupational History  . Not on file.   Social History Main Topics  . Smoking status: Former Smoker    Packs/day: 1.00    Years: 10.00    Types: Cigarettes    Quit date: 12/26/1995  . Smokeless tobacco: Never Used  . Alcohol use Yes     Comment: occasional wine   . Drug use: No  . Sexual activity: Yes   Other Topics Concern  . Not on file   Social History Narrative   Occupation: Babys[employer]   Married   Regular exercise- yes    Outpatient Medications Prior to Visit  Medication Sig Dispense Refill  . albuterol (PROVENTIL HFA;VENTOLIN HFA) 108 (90 Base) MCG/ACT inhaler Inhale 2 puffs into the lungs every 4 (four) hours as needed for  wheezing or shortness of breath. 1 Inhaler 2  . ALPRAZolam (XANAX) 0.5 MG tablet Take 1 tablet (0.5 mg total) by mouth as needed for anxiety. 10 tablet 0  . Cyanocobalamin (VITAMIN B 12 PO) Take by mouth.      . Difluprednate (DUREZOL) 0.05 % EMUL Apply 3 drops to eye daily.    . indapamide (LOZOL) 1.25 MG tablet Take 1.25 mg by mouth.    . levocetirizine (XYZAL) 5 MG tablet Take 1 tablet (5 mg total) by mouth every evening. 30 tablet 5  . mesalamine (LIALDA) 1.2 g EC tablet Take 2 tablets (2.4 g total) by mouth 2 (two) times daily. 120 tablet 3  . montelukast (SINGULAIR) 10 MG tablet Take 1 tablet (10 mg total) by mouth at bedtime. 30 tablet 5  . omega-3 acid ethyl esters (LOVAZA) 1 g capsule Take 2 capsules (2 g total) by mouth 2 (two) times daily. 120 capsule 1  . potassium citrate (UROCIT-K) 10 MEQ (1080 MG) SR tablet Take 2 tablets (20 mEq total) by mouth daily. 60 tablet 5  . Probiotic Product (PROBIOTIC ADVANCED PO) Take 1  capsule by mouth daily. Reported on 06/08/2016    . ranitidine (ZANTAC) 150 MG tablet TAKE ONE TABLET ONE TO TWO TIMES A DAY AS DIRECTED. 60 tablet 5  . valsartan (DIOVAN) 320 MG tablet TAKE ONE (1) TABLET BY MOUTH EVERY DAY. OFFICE VISIT DUE NOW 30 tablet 5  . ezetimibe (ZETIA) 10 MG tablet TAKE ONE (1) TABLET BY MOUTH EVERY DAY. 30 tablet 0  . FLUoxetine (PROZAC) 20 MG capsule TAKE ONE (1) CAPSULE EACH DAY 30 capsule 0   Facility-Administered Medications Prior to Visit  Medication Dose Route Frequency Provider Last Rate Last Dose  . 0.9 %  sodium chloride infusion  500 mL Intravenous Continuous Jerene Bears, MD        Allergies  Allergen Reactions  . Azithromycin Hives    Review of Systems  Constitutional: Negative for fever and malaise/fatigue.  HENT: Negative for congestion.   Eyes: Negative for blurred vision.  Respiratory: Negative for cough and shortness of breath.   Cardiovascular: Negative for chest pain, palpitations and leg swelling.    Gastrointestinal: Negative for vomiting.  Musculoskeletal: Negative for back pain.  Skin: Negative for rash.  Neurological: Negative for loss of consciousness and headaches.       Objective:    Physical Exam  Constitutional: She is oriented to person, place, and time. She appears well-developed and well-nourished. No distress.  HENT:  Head: Normocephalic and atraumatic.  Eyes: Conjunctivae are normal.  Neck: Normal range of motion. No thyromegaly present.  Cardiovascular: Normal rate and regular rhythm.   Pulmonary/Chest: Effort normal and breath sounds normal. She has no wheezes.  Abdominal: Soft. Bowel sounds are normal. There is no tenderness.  Musculoskeletal: Normal range of motion. She exhibits no edema or deformity.  Neurological: She is alert and oriented to person, place, and time.  Skin: Skin is warm and dry. She is not diaphoretic.  Psychiatric: She has a normal mood and affect.    BP 120/70 (BP Location: Left Arm, Cuff Size: Large)   Pulse 66   Temp 98.3 F (36.8 C) (Oral)   Resp 16   Ht 5' 4"  (1.626 m)   Wt 202 lb (91.6 kg)   LMP 01/25/2017   SpO2 97%   BMI 34.67 kg/m  Wt Readings from Last 3 Encounters:  01/25/17 202 lb (91.6 kg)  12/12/16 207 lb (93.9 kg)  11/28/16 207 lb (93.9 kg)     Lab Results  Component Value Date   WBC 9.6 10/24/2016   HGB 13.6 10/24/2016   HCT 40.9 10/24/2016   PLT 267.0 10/24/2016   GLUCOSE 106 (H) 10/24/2016   CHOL 198 10/24/2016   TRIG 94.0 10/24/2016   HDL 63.40 10/24/2016   LDLDIRECT 123.2 12/20/2011   LDLCALC 115 (H) 10/24/2016   ALT 26 10/24/2016   AST 29 10/24/2016   NA 139 10/24/2016   K 4.0 10/24/2016   CL 102 10/24/2016   CREATININE 0.72 10/24/2016   BUN 19 10/24/2016   CO2 27 10/24/2016   TSH 2.00 10/24/2016   HGBA1C 5.8 10/24/2016    Lab Results  Component Value Date   TSH 2.00 10/24/2016   Lab Results  Component Value Date   WBC 9.6 10/24/2016   HGB 13.6 10/24/2016   HCT 40.9 10/24/2016    MCV 86.3 10/24/2016   PLT 267.0 10/24/2016   Lab Results  Component Value Date   NA 139 10/24/2016   K 4.0 10/24/2016   CO2 27 10/24/2016   GLUCOSE 106 (H)  10/24/2016   BUN 19 10/24/2016   CREATININE 0.72 10/24/2016   BILITOT 0.5 10/24/2016   ALKPHOS 68 10/24/2016   AST 29 10/24/2016   ALT 26 10/24/2016   PROT 7.3 10/24/2016   ALBUMIN 4.2 10/24/2016   CALCIUM 9.5 10/24/2016   ANIONGAP 6 01/05/2015   GFR 91.10 10/24/2016   Lab Results  Component Value Date   CHOL 198 10/24/2016   Lab Results  Component Value Date   HDL 63.40 10/24/2016   Lab Results  Component Value Date   LDLCALC 115 (H) 10/24/2016   Lab Results  Component Value Date   TRIG 94.0 10/24/2016   Lab Results  Component Value Date   CHOLHDL 3 10/24/2016   Lab Results  Component Value Date   HGBA1C 5.8 10/24/2016       Assessment & Plan:   Problem List Items Addressed This Visit      Unprioritized   Hyperlipidemia   Relevant Medications   ezetimibe (ZETIA) 10 MG tablet   Other Relevant Orders   Lipid panel   Comprehensive metabolic panel   Essential hypertension - Primary    Stable con't meds      Relevant Medications   ezetimibe (ZETIA) 10 MG tablet   Other Relevant Orders   Lipid panel   Comprehensive metabolic panel    Other Visit Diagnoses    Generalized anxiety disorder       Relevant Medications   FLUoxetine (PROZAC) 20 MG capsule   Morbid obesity ([place])       Crohn's disease of large intestine without complication ([place])          I am having Ms. Consolo maintain her Cyanocobalamin (VITAMIN B 12 PO), Probiotic Product (PROBIOTIC ADVANCED PO), montelukast, levocetirizine, ranitidine, albuterol, potassium citrate, valsartan, omega-3 acid ethyl esters, indapamide, Difluprednate, mesalamine, ALPRAZolam, FLUoxetine, and ezetimibe. We will continue to administer sodium chloride.  Meds ordered this encounter  Medications  . FLUoxetine (PROZAC) 20 MG capsule    Sig: TAKE ONE (1)  CAPSULE EACH DAY    Dispense:  90 capsule    Refill:  3  . ezetimibe (ZETIA) 10 MG tablet    Sig: TAKE ONE (1) TABLET BY MOUTH EVERY DAY.    Dispense:  90 tablet    Refill:  1   CMA served as Education administrator during this visit. History, Physical and Plan performed by medical provider. Documentation and orders reviewed and attested to.   Ann Held, DO

## 2017-01-25 NOTE — Progress Notes (Signed)
Pre visit review using our clinic review tool, if applicable. No additional management support is needed unless otherwise documented below in the visit note. 

## 2017-01-25 NOTE — Patient Instructions (Signed)
Hypertension Hypertension, commonly called high blood pressure, is when the force of blood pumping through your arteries is too strong. Your arteries are the blood vessels that carry blood from your heart throughout your body. A blood pressure reading consists of a higher number over a lower number, such as 110/72. The higher number (systolic) is the pressure inside your arteries when your heart pumps. The lower number (diastolic) is the pressure inside your arteries when your heart relaxes. Ideally you want your blood pressure below 120/80. Hypertension forces your heart to work harder to pump blood. Your arteries may become narrow or stiff. Having untreated or uncontrolled hypertension can cause heart attack, stroke, kidney disease, and other problems. What increases the risk? Some risk factors for high blood pressure are controllable. Others are not. Risk factors you cannot control include:  Race. You may be at higher risk if you are African American.  Age. Risk increases with age.  Gender. Men are at higher risk than women before age 45 years. After age 65, women are at higher risk than men. Risk factors you can control include:  Not getting enough exercise or physical activity.  Being overweight.  Getting too much fat, sugar, calories, or salt in your diet.  Drinking too much alcohol. What are the signs or symptoms? Hypertension does not usually cause signs or symptoms. Extremely high blood pressure (hypertensive crisis) may cause headache, anxiety, shortness of breath, and nosebleed. How is this diagnosed? To check if you have hypertension, your health care provider will measure your blood pressure while you are seated, with your arm held at the level of your heart. It should be measured at least twice using the same arm. Certain conditions can cause a difference in blood pressure between your right and left arms. A blood pressure reading that is higher than normal on one occasion does  not mean that you need treatment. If it is not clear whether you have high blood pressure, you may be asked to return on a different day to have your blood pressure checked again. Or, you may be asked to monitor your blood pressure at home for 1 or more weeks. How is this treated? Treating high blood pressure includes making lifestyle changes and possibly taking medicine. Living a healthy lifestyle can help lower high blood pressure. You may need to change some of your habits. Lifestyle changes may include:  Following the DASH diet. This diet is high in fruits, vegetables, and whole grains. It is low in salt, red meat, and added sugars.  Keep your sodium intake below 2,300 mg per day.  Getting at least 30-45 minutes of aerobic exercise at least 4 times per week.  Losing weight if necessary.  Not smoking.  Limiting alcoholic beverages.  Learning ways to reduce stress. Your health care provider may prescribe medicine if lifestyle changes are not enough to get your blood pressure under control, and if one of the following is true:  You are 18-59 years of age and your systolic blood pressure is above 140.  You are 60 years of age or older, and your systolic blood pressure is above 150.  Your diastolic blood pressure is above 90.  You have diabetes, and your systolic blood pressure is over 140 or your diastolic blood pressure is over 90.  You have kidney disease and your blood pressure is above 140/90.  You have heart disease and your blood pressure is above 140/90. Your personal target blood pressure may vary depending on your medical   conditions, your age, and other factors. Follow these instructions at home:  Have your blood pressure rechecked as directed by your health care provider.  Take medicines only as directed by your health care provider. Follow the directions carefully. Blood pressure medicines must be taken as prescribed. The medicine does not work as well when you skip  doses. Skipping doses also puts you at risk for problems.  Do not smoke.  Monitor your blood pressure at home as directed by your health care provider. Contact a health care provider if:  You think you are having a reaction to medicines taken.  You have recurrent headaches or feel dizzy.  You have swelling in your ankles.  You have trouble with your vision. Get help right away if:  You develop a severe headache or confusion.  You have unusual weakness, numbness, or feel faint.  You have severe chest or abdominal pain.  You vomit repeatedly.  You have trouble breathing. This information is not intended to replace advice given to you by your health care provider. Make sure you discuss any questions you have with your health care provider. Document Released: 12/11/2005 Document Revised: 05/18/2016 Document Reviewed: 10/03/2013 Elsevier Interactive Patient Education  2017 Elsevier Inc.  

## 2017-01-27 ENCOUNTER — Encounter: Payer: Self-pay | Admitting: Family Medicine

## 2017-01-27 NOTE — Assessment & Plan Note (Signed)
Stable con't meds 

## 2017-02-07 ENCOUNTER — Ambulatory Visit (INDEPENDENT_AMBULATORY_CARE_PROVIDER_SITE_OTHER): Payer: BLUE CROSS/BLUE SHIELD | Admitting: Internal Medicine

## 2017-02-07 ENCOUNTER — Encounter: Payer: Self-pay | Admitting: Internal Medicine

## 2017-02-07 VITALS — BP 126/82 | HR 80 | Ht 64.25 in | Wt 202.4 lb

## 2017-02-07 DIAGNOSIS — K5010 Crohn's disease of large intestine without complications: Secondary | ICD-10-CM | POA: Diagnosis not present

## 2017-02-07 DIAGNOSIS — K50918 Crohn's disease, unspecified, with other complication: Secondary | ICD-10-CM

## 2017-02-07 DIAGNOSIS — H22 Disorders of iris and ciliary body in diseases classified elsewhere: Secondary | ICD-10-CM

## 2017-02-07 MED ORDER — MESALAMINE 1.2 G PO TBEC: 2.4000 g | DELAYED_RELEASE_TABLET | Freq: Two times a day (BID) | ORAL | 3 refills | Status: DC

## 2017-02-07 NOTE — Patient Instructions (Signed)
We have sent the following medications to your pharmacy for you to pick up at your convenience: Lialda 4.8 grams daily.  Please follow up with Dr Hilarie Fredrickson in 9-12 months.  If you are age 51 or older, your body mass index should be between 23-30. Your Body mass index is 34.47 kg/m. If this is out of the aforementioned range listed, please consider follow up with your Primary Care Provider.  If you are age 25 or younger, your body mass index should be between 19-25. Your Body mass index is 34.47 kg/m. If this is out of the aformentioned range listed, please consider follow up with your Primary Care Provider.

## 2017-02-07 NOTE — Progress Notes (Signed)
Subjective:    Patient ID: Lindsey [name], female    DOB: 06-Mar-1966, 51 y.o.   MRN: [id number]  HPI Lindsey [name] is a 51 year old female with a relatively new diagnosis of Crohn's colitis, history of iritis, history of hypertension and hyperlipidemia, kidney stones who is here for follow-up.  She was initially seen in the office in June 2017 to discuss left upper quadrant abdominal pain. At that point my suspicion for a GI source for pain was low and CT scan of the abdomen and pelvis was unremarkable. The pain was felt possibly related to a musculoskeletal cause or even kidney stones.  She then came for a screening colonoscopy in December. This showed normal terminal ileum. There was patchy ulcerated mucosa present from the ileocecal valve to the rectosigmoid colon. There was ulceration and erythema with the appearance of Crohn's disease. There was sigmoid diverticulosis and hypertrophied anal papillae. Pathology showed markedly active chronic colitis with ulceration without dysplasia. Findings were consistent with IBD.  After this diagnosis she was started on Lialda 4.8 g daily and she returns for follow-up.  Overall she feels that she is feeling better. She's had less abdominal pain though she does still have some occasional left and right-sided abdominal pain but this is milder. She's been trying to eat a "cleaner" diet and avoiding processed foods. She feels like her energy level is better. She's had no further blood in her stool which had been present intermittently. She still having 3-4 loose stools per day which is chronic for her. She uses essential oils which she can rub on her left-sided abdominal pain when it is present which she has found helpful. No fevers or chills. Since Lialda her iritis has resolved   Review of Systems As per history of present illness, otherwise negative  Current Medications, Allergies, Past Medical History, Past Surgical History, Family History and  Social History were reviewed in [employer] record.     Objective:   Physical Exam BP 126/82 (BP Location: Left Arm, Patient Position: Sitting, Cuff Size: Large)   Pulse 80   Ht 5' 4.25" (1.632 m) Comment: height measured without shoes  Wt 202 lb 6 oz (91.8 kg)   LMP 01/25/2017   BMI 34.47 kg/m  Constitutional: Well-developed and well-nourished. No distress. HEENT: Normocephalic and atraumatic.  Conjunctivae are normal.  No scleral icterus. Neck: Neck supple. Trachea midline. Cardiovascular: Normal rate, regular rhythm and intact distal pulses. No M/R/G Pulmonary/chest: Effort normal and breath sounds normal. No wheezing, rales or rhonchi. Abdominal: Soft, nontender, nondistended. Bowel sounds active throughout. There are no masses palpable. No hepatosplenomegaly. Extremities: no clubbing, cyanosis, or edema Neurological: Alert and oriented to person place and time. Psychiatric: Normal mood and affect. Behavior is normal.     Assessment & Plan:  51 year old female with a relatively new diagnosis of Crohn's colitis, history of iritis, history of hypertension and hyperlipidemia, kidney stones who is here for follow-up.  1. Crohn's colitis -- new diagnosis a colonoscopy in December 2017. Symptoms are mild and inflammation in the colon was patchy that there was considerable ulceration and areas that were involved. We discussed maintenance medication as well as other available options for treatment such as immunomodulators and Biologics. Given her overall improvement we will continue Lialda 4.8 g daily for now. We discussed possibly repeat colonoscopy in 1-2 years to ensure mucosal healing and improvement on 5 ASA therapy. If endoscopic remission not achieved we would consider escalating therapy. She is comfortable with this plan.  I would like to see her back in 9-12 months for follow-up.  2. 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  25 minutes spent with the patient today. Greater than 50% was spent in counseling and coordination of care with the patient

## 2017-02-27 ENCOUNTER — Other Ambulatory Visit: Payer: Self-pay | Admitting: Allergy

## 2017-02-27 DIAGNOSIS — J4520 Mild intermittent asthma, uncomplicated: Secondary | ICD-10-CM

## 2017-02-27 MED ORDER — MONTELUKAST SODIUM 10 MG PO TABS: 10.0000 mg | ORAL_TABLET | Freq: Every day | ORAL | 5 refills | Status: DC

## 2017-03-20 ENCOUNTER — Other Ambulatory Visit: Payer: Self-pay

## 2017-03-20 DIAGNOSIS — L501 Idiopathic urticaria: Secondary | ICD-10-CM

## 2017-03-20 MED ORDER — LEVOCETIRIZINE DIHYDROCHLORIDE 5 MG PO TABS: 5.0000 mg | ORAL_TABLET | Freq: Every evening | ORAL | 5 refills | Status: DC

## 2017-04-16 ENCOUNTER — Ambulatory Visit (INDEPENDENT_AMBULATORY_CARE_PROVIDER_SITE_OTHER): Payer: BLUE CROSS/BLUE SHIELD | Admitting: Family Medicine

## 2017-04-16 ENCOUNTER — Ambulatory Visit: Payer: BLUE CROSS/BLUE SHIELD | Admitting: Family Medicine

## 2017-04-16 ENCOUNTER — Encounter: Payer: Self-pay | Admitting: Family Medicine

## 2017-04-16 VITALS — BP 104/70 | HR 72 | Temp 98.0°F | Resp 16 | Ht 64.0 in | Wt 199.0 lb

## 2017-04-16 DIAGNOSIS — I10 Essential (primary) hypertension: Secondary | ICD-10-CM

## 2017-04-16 DIAGNOSIS — E782 Mixed hyperlipidemia: Secondary | ICD-10-CM

## 2017-04-16 LAB — CBC
HCT: 41.8 % (ref 36.0–46.0)
Hemoglobin: 13.7 g/dL (ref 12.0–15.0)
MCHC: 32.7 g/dL (ref 30.0–36.0)
MCV: 87.5 fl (ref 78.0–100.0)
Platelets: 259 10*3/uL (ref 150.0–400.0)
RBC: 4.78 Mil/uL (ref 3.87–5.11)
RDW: 13.8 % (ref 11.5–15.5)
WBC: 7.6 10*3/uL (ref 4.0–10.5)

## 2017-04-16 NOTE — Progress Notes (Signed)
Subjective:    Patient ID: Lindsey [name], female    DOB: Jan 29, 1966, 51 y.o.   MRN: [id number]  Chief Complaint  Patient presents with  . Hypertension    follow up    Hypertension  Pertinent negatives include no blurred vision, chest pain, headaches or palpitations.  Hyperlipidemia  This is a chronic problem. The current episode started more than 1 year ago. Pertinent negatives include no chest pain.    Patient is in today for labs only--- pt only needed labs no ov Patient Care Team: Ann Held, DO as PCP - General W Evette Cristal, MD as Consulting Physician (Obstetrics and Gynecology) Pamala Hurry, MD as Referring Physician (Urology) Adelina Mings, MD as Consulting Physician (Allergy and Immunology)   Past Medical History:  Diagnosis Date  . Allergy   . Anxiety   . Colitis   . Depression   . Diverticulosis   . Hyperlipidemia   . Hypertension   . Mild intermittent asthma    rarely uses inhaler  . Nephrolithiasis     Past Surgical History:  Procedure Laterality Date  . COMBINED HYSTEROSCOPY DIAGNOSTIC / D&C    . LEEP    . LITHOTRIPSY    . WISDOM TOOTH EXTRACTION      Family History  Problem Relation Age of Onset  . Dementia Father   . Alzheimer's disease Father   . Hyperlipidemia    . Hypertension    . Diabetes    . Crohn's disease Neg Hx   . Esophageal cancer Neg Hx   . Stomach cancer Neg Hx   . Rectal cancer Neg Hx     Social History   Social History  . Marital status: Married    Spouse name: N/A  . Number of children: N/A  . Years of education: N/A   Occupational History  . Not on file.   Social History Main Topics  . Smoking status: Former Smoker    Packs/day: 1.00    Years: 10.00    Types: Cigarettes    Quit date: 12/26/1995  . Smokeless tobacco: Never Used  . Alcohol use Yes     Comment: occasional wine   . Drug use: No  . Sexual activity: Yes   Other Topics Concern  . Not on file   Social History Narrative     Occupation: Babysit   Married   Regular exercise- yes    Outpatient Medications Prior to Visit  Medication Sig Dispense Refill  . albuterol (PROVENTIL HFA;VENTOLIN HFA) 108 (90 Base) MCG/ACT inhaler Inhale 2 puffs into the lungs every 4 (four) hours as needed for wheezing or shortness of breath. 1 Inhaler 2  . ALPRAZolam (XANAX) 0.5 MG tablet Take 1 tablet (0.5 mg total) by mouth as needed for anxiety. 10 tablet 0  . Cyanocobalamin (VITAMIN B 12 PO) Take 1 tablet by mouth daily.     Marland Kitchen ezetimibe (ZETIA) 10 MG tablet TAKE ONE (1) TABLET BY MOUTH EVERY DAY. 90 tablet 1  . FLUoxetine (PROZAC) 20 MG capsule TAKE ONE (1) CAPSULE EACH DAY 90 capsule 3  . indapamide (LOZOL) 1.25 MG tablet Take 1.25 mg by mouth daily.     Marland Kitchen levocetirizine (XYZAL) 5 MG tablet Take 1 tablet (5 mg total) by mouth every evening. 30 tablet 5  . mesalamine (LIALDA) 1.2 g EC tablet Take 2 tablets (2.4 g total) by mouth 2 (two) times daily. 120 tablet 3  . montelukast (SINGULAIR) 10 MG tablet Take 1  tablet (10 mg total) by mouth at bedtime. 30 tablet 5  . omega-3 acid ethyl esters (LOVAZA) 1 g capsule Take 2 capsules (2 g total) by mouth 2 (two) times daily. 120 capsule 1  . potassium citrate (UROCIT-K) 10 MEQ (1080 MG) SR tablet Take 2 tablets (20 mEq total) by mouth daily. 60 tablet 5  . Probiotic Product (PROBIOTIC ADVANCED PO) Take 1 capsule by mouth daily. Reported on 06/08/2016    . ranitidine (ZANTAC) 150 MG tablet TAKE ONE TABLET ONE TO TWO TIMES A DAY AS DIRECTED. 60 tablet 5  . valsartan (DIOVAN) 320 MG tablet TAKE ONE (1) TABLET BY MOUTH EVERY DAY. OFFICE VISIT DUE NOW 30 tablet 5   Facility-Administered Medications Prior to Visit  Medication Dose Route Frequency Provider Last Rate Last Dose  . 0.9 %  sodium chloride infusion  500 mL Intravenous Continuous Jerene Bears, MD        Allergies  Allergen Reactions  . Azithromycin Hives    Review of Systems  Constitutional: Negative for fever.  HENT: Negative  for congestion.   Eyes: Negative for blurred vision.  Respiratory: Negative for cough.   Cardiovascular: Negative for chest pain and palpitations.  Gastrointestinal: Negative for vomiting.  Musculoskeletal: Negative for back pain.  Skin: Negative for rash.  Neurological: Negative for loss of consciousness and headaches.       Objective:    Physical Exam    There were no vitals taken for this visit. Wt Readings from Last 3 Encounters:  02/07/17 202 lb 6 oz (91.8 kg)  01/25/17 202 lb (91.6 kg)  12/12/16 207 lb (93.9 kg)   BP Readings from Last 3 Encounters:  02/07/17 126/82  01/25/17 120/70  12/12/16 128/77     Immunization History  Administered Date(s) Administered  . Influenza Whole 11/19/2007, 10/25/2009  . Influenza,inj,Quad PF,36+ Mos 09/21/2014, 09/07/2015, 10/17/2016  . PPD Test 06/10/2012  . Tdap 05/14/2012    Health Maintenance  Topic Date Due  . HIV Screening  10/17/2017 (Originally 09/26/1981)  . MAMMOGRAM  06/14/2017  . INFLUENZA VACCINE  07/25/2017  . PAP SMEAR  05/26/2019  . TETANUS/TDAP  05/14/2022  . COLONOSCOPY  12/12/2026    Lab Results  Component Value Date   WBC 9.6 10/24/2016   HGB 13.6 10/24/2016   HCT 40.9 10/24/2016   PLT 267.0 10/24/2016   GLUCOSE 106 (H) 10/24/2016   CHOL 198 10/24/2016   TRIG 94.0 10/24/2016   HDL 63.40 10/24/2016   LDLDIRECT 123.2 12/20/2011   LDLCALC 115 (H) 10/24/2016   ALT 26 10/24/2016   AST 29 10/24/2016   NA 139 10/24/2016   K 4.0 10/24/2016   CL 102 10/24/2016   CREATININE 0.72 10/24/2016   BUN 19 10/24/2016   CO2 27 10/24/2016   TSH 2.00 10/24/2016   HGBA1C 5.8 10/24/2016    Lab Results  Component Value Date   TSH 2.00 10/24/2016   Lab Results  Component Value Date   WBC 9.6 10/24/2016   HGB 13.6 10/24/2016   HCT 40.9 10/24/2016   MCV 86.3 10/24/2016   PLT 267.0 10/24/2016   Lab Results  Component Value Date   NA 139 10/24/2016   K 4.0 10/24/2016   CO2 27 10/24/2016   GLUCOSE 106  (H) 10/24/2016   BUN 19 10/24/2016   CREATININE 0.72 10/24/2016   BILITOT 0.5 10/24/2016   ALKPHOS 68 10/24/2016   AST 29 10/24/2016   ALT 26 10/24/2016   PROT 7.3 10/24/2016  ALBUMIN 4.2 10/24/2016   CALCIUM 9.5 10/24/2016   ANIONGAP 6 01/05/2015   GFR 91.10 10/24/2016   Lab Results  Component Value Date   CHOL 198 10/24/2016   Lab Results  Component Value Date   HDL 63.40 10/24/2016   Lab Results  Component Value Date   LDLCALC 115 (H) 10/24/2016   Lab Results  Component Value Date   TRIG 94.0 10/24/2016   Lab Results  Component Value Date   CHOLHDL 3 10/24/2016   Lab Results  Component Value Date   HGBA1C 5.8 10/24/2016         Assessment & Plan:   Problem List Items Addressed This Visit    None      I am having Ms. Ivens maintain her Cyanocobalamin (VITAMIN B 12 PO), Probiotic Product (PROBIOTIC ADVANCED PO), ranitidine, albuterol, potassium citrate, valsartan, omega-3 acid ethyl esters, indapamide, ALPRAZolam, FLUoxetine, ezetimibe, mesalamine, montelukast, and levocetirizine. We will continue to administer sodium chloride.   Rip Harbour, LPN  Patient ID: Lindsey [name], female   DOB: [birth year], 51 y.o.   MRN: [id number]

## 2017-04-16 NOTE — Progress Notes (Signed)
Pre visit review using our clinic review tool, if applicable. No additional management support is needed unless otherwise documented below in the visit note. 

## 2017-06-25 ENCOUNTER — Other Ambulatory Visit: Payer: Self-pay | Admitting: *Deleted

## 2017-06-25 MED ORDER — VALSARTAN 320 MG PO TABS: ORAL_TABLET | ORAL | 2 refills | Status: DC

## 2017-08-07 ENCOUNTER — Other Ambulatory Visit: Payer: Self-pay

## 2017-08-07 DIAGNOSIS — E785 Hyperlipidemia, unspecified: Secondary | ICD-10-CM

## 2017-08-07 MED ORDER — EZETIMIBE 10 MG PO TABS: ORAL_TABLET | ORAL | 1 refills | Status: DC

## 2017-08-07 NOTE — Telephone Encounter (Signed)
Rx sent to pharmacy. LB

## 2017-08-07 NOTE — Telephone Encounter (Signed)
[facility] refill request via fax. Rx sent to pharmacy. LB

## 2017-08-24 ENCOUNTER — Telehealth: Payer: Self-pay | Admitting: Family Medicine

## 2017-08-24 MED ORDER — OLMESARTAN MEDOXOMIL 40 MG PO TABS: 40.0000 mg | ORAL_TABLET | Freq: Every day | ORAL | 2 refills | Status: DC

## 2017-08-24 MED ORDER — OMEGA-3-ACID ETHYL ESTERS 1 G PO CAPS: 2.0000 | ORAL_CAPSULE | Freq: Two times a day (BID) | ORAL | 1 refills | Status: DC

## 2017-08-24 NOTE — Telephone Encounter (Signed)
Refills of the Lovaza was sent in.   Dr. Etter Sjogren please advise as to pt's Valsartan as it is on recall.

## 2017-08-24 NOTE — Telephone Encounter (Signed)
Self.    Refill for LOVAZA and ALSO, pt says that she was taking Valsartan due to recall pt would like to be advised      Pharmacy: [place], [place] - [id number]

## 2017-08-24 NOTE — Telephone Encounter (Signed)
benicar 40 mg #30  1 po qd, 2 refills  bp check in 2-3 weeks

## 2017-08-24 NOTE — Telephone Encounter (Signed)
Spoke with pt and she is aware of the change in her medication. New rx was sent in. She will contact us to figure out when she can come for her BP re-check. She had no additional questions at this time. Nothing further is needed

## 2017-08-24 NOTE — Telephone Encounter (Signed)
Left message on machine to call back. Will need to inform her of the change in her medication

## 2017-08-24 NOTE — Telephone Encounter (Signed)
Patient returned call

## 2017-09-03 ENCOUNTER — Other Ambulatory Visit: Payer: Self-pay | Admitting: Internal Medicine

## 2017-09-03 NOTE — Telephone Encounter (Signed)
Okay to refill? 

## 2017-09-13 ENCOUNTER — Encounter: Payer: Self-pay | Admitting: *Deleted

## 2017-09-17 ENCOUNTER — Other Ambulatory Visit: Payer: Self-pay | Admitting: Obstetrics & Gynecology

## 2017-09-17 DIAGNOSIS — Z1231 Encounter for screening mammogram for malignant neoplasm of breast: Secondary | ICD-10-CM

## 2017-09-20 ENCOUNTER — Telehealth: Payer: Self-pay | Admitting: Family Medicine

## 2017-09-20 NOTE — Telephone Encounter (Signed)
Relation to pt: self  Call back number:[phone]   Reason for call:  Patient sent mychart message requesting BP orders, patient last seen 04/16/17, please advise  Appointment Request From: Lindsey [name]    With Provider: Ann Held, [place] at [facility] [place]]    Preferred Date Range: From 09/24/2017 To 09/27/2017    Preferred Times: Any    Reason: To address the following health maintenance concerns.  Influenza Vaccine    Comments:  MOnday after 11 a.m. Just need flu shot and blood pressure check

## 2017-09-21 ENCOUNTER — Other Ambulatory Visit: Payer: Self-pay

## 2017-09-21 DIAGNOSIS — L501 Idiopathic urticaria: Secondary | ICD-10-CM

## 2017-09-21 DIAGNOSIS — J4520 Mild intermittent asthma, uncomplicated: Secondary | ICD-10-CM

## 2017-09-24 NOTE — Telephone Encounter (Signed)
TB-Does not look like there is any nurse visits available till 10.12.18?

## 2017-09-26 ENCOUNTER — Ambulatory Visit: Payer: BLUE CROSS/BLUE SHIELD

## 2017-09-27 ENCOUNTER — Other Ambulatory Visit: Payer: Self-pay

## 2017-09-27 DIAGNOSIS — J4520 Mild intermittent asthma, uncomplicated: Secondary | ICD-10-CM

## 2017-09-27 DIAGNOSIS — L501 Idiopathic urticaria: Secondary | ICD-10-CM

## 2017-09-27 NOTE — Telephone Encounter (Signed)
Declined montelukast 46m refill. Pt. Hasn't been seen since august of 2017.

## 2017-09-27 NOTE — Telephone Encounter (Signed)
RF for Xyzal denied, pt needs an OV

## 2017-09-28 ENCOUNTER — Telehealth: Payer: Self-pay | Admitting: Pediatrics

## 2017-09-28 DIAGNOSIS — J4520 Mild intermittent asthma, uncomplicated: Secondary | ICD-10-CM

## 2017-09-28 DIAGNOSIS — L501 Idiopathic urticaria: Secondary | ICD-10-CM

## 2017-09-28 MED ORDER — LEVOCETIRIZINE DIHYDROCHLORIDE 5 MG PO TABS: 5.0000 mg | ORAL_TABLET | Freq: Every evening | ORAL | 0 refills | Status: DC

## 2017-09-28 MED ORDER — MONTELUKAST SODIUM 10 MG PO TABS: 10.0000 mg | ORAL_TABLET | Freq: Every day | ORAL | 0 refills | Status: DC

## 2017-09-28 NOTE — Telephone Encounter (Signed)
Patient called to see what was going on with her refills. I informed her she hasn't been seen since 07/2016 and would need in office visit. She made an appt for 10/15/2017 and would like a refill on her montelukast and xyzal, refills were sent in.

## 2017-10-02 ENCOUNTER — Encounter: Payer: Self-pay | Admitting: Family Medicine

## 2017-10-02 NOTE — Progress Notes (Unsigned)
Called pt lmg for her to call and schedule nurse visit for bp check per previous note.

## 2017-10-03 NOTE — Telephone Encounter (Signed)
Patient requesting BP orders,please advise

## 2017-10-04 NOTE — Telephone Encounter (Signed)
lvm advising patient of message below

## 2017-10-04 NOTE — Telephone Encounter (Signed)
Okay to schedule nurse visit  ?

## 2017-10-05 NOTE — Telephone Encounter (Signed)
Pt was to f/u in 6 months w/ PCP.

## 2017-10-05 NOTE — Telephone Encounter (Signed)
Patient last seen 04/16/17 please advise when patient should return to follow up with PCP

## 2017-10-08 ENCOUNTER — Ambulatory Visit (INDEPENDENT_AMBULATORY_CARE_PROVIDER_SITE_OTHER): Payer: BLUE CROSS/BLUE SHIELD | Admitting: Internal Medicine

## 2017-10-08 ENCOUNTER — Ambulatory Visit
Admission: RE | Admit: 2017-10-08 | Discharge: 2017-10-08 | Disposition: A | Discharge status: Home / Self Care | Payer: BLUE CROSS/BLUE SHIELD | Source: Ambulatory Visit | Attending: Obstetrics & Gynecology | Admitting: Obstetrics & Gynecology

## 2017-10-08 ENCOUNTER — Encounter: Payer: Self-pay | Admitting: Internal Medicine

## 2017-10-08 ENCOUNTER — Other Ambulatory Visit (INDEPENDENT_AMBULATORY_CARE_PROVIDER_SITE_OTHER): Payer: BLUE CROSS/BLUE SHIELD

## 2017-10-08 VITALS — BP 106/70 | HR 62 | Ht 64.25 in | Wt 206.2 lb

## 2017-10-08 DIAGNOSIS — K5010 Crohn's disease of large intestine without complications: Secondary | ICD-10-CM

## 2017-10-08 DIAGNOSIS — Z1231 Encounter for screening mammogram for malignant neoplasm of breast: Secondary | ICD-10-CM

## 2017-10-08 DIAGNOSIS — K50111 Crohn's disease of large intestine with rectal bleeding: Secondary | ICD-10-CM

## 2017-10-08 DIAGNOSIS — K219 Gastro-esophageal reflux disease without esophagitis: Secondary | ICD-10-CM

## 2017-10-08 LAB — COMPREHENSIVE METABOLIC PANEL
ALT: 20 U/L (ref 0–35)
AST: 16 U/L (ref 0–37)
Albumin: 4.1 g/dL (ref 3.5–5.2)
Alkaline Phosphatase: 56 U/L (ref 39–117)
BUN: 17 mg/dL (ref 6–23)
CO2: 31 mEq/L (ref 19–32)
Calcium: 9.2 mg/dL (ref 8.4–10.5)
Chloride: 99 mEq/L (ref 96–112)
Creatinine, Ser: 0.68 mg/dL (ref 0.40–1.20)
GFR: 96.94 mL/min (ref >60.00)
Glucose, Bld: 112 mg/dL — ABNORMAL HIGH (ref 70–99)
Potassium: 3.7 mEq/L (ref 3.5–5.1)
Sodium: 137 mEq/L (ref 135–145)
Total Bilirubin: 0.3 mg/dL (ref 0.2–1.2)
Total Protein: 7.1 g/dL (ref 6.0–8.3)

## 2017-10-08 LAB — HIGH SENSITIVITY CRP: CRP, High Sensitivity: 9.4 mg/L — ABNORMAL HIGH (ref 0.000–5.000)

## 2017-10-08 LAB — CBC WITH DIFFERENTIAL/PLATELET
Basophils Absolute: 0.1 10*3/uL (ref 0.0–0.1)
Basophils Relative: 0.6 % (ref 0.0–3.0)
Eosinophils Absolute: 0.2 10*3/uL (ref 0.0–0.7)
Eosinophils Relative: 2.5 % (ref 0.0–5.0)
HCT: 39.4 % (ref 36.0–46.0)
Hemoglobin: 13.4 g/dL (ref 12.0–15.0)
Lymphocytes Relative: 32.1 % (ref 12.0–46.0)
Lymphs Abs: 2.9 10*3/uL (ref 0.7–4.0)
MCHC: 34.1 g/dL (ref 30.0–36.0)
MCV: 88.7 fl (ref 78.0–100.0)
Monocytes Absolute: 0.8 10*3/uL (ref 0.1–1.0)
Monocytes Relative: 8.9 % (ref 3.0–12.0)
Neutro Abs: 5.1 10*3/uL (ref 1.4–7.7)
Neutrophils Relative %: 55.9 % (ref 43.0–77.0)
Platelets: 250 10*3/uL (ref 150.0–400.0)
RBC: 4.44 Mil/uL (ref 3.87–5.11)
RDW: 13.7 % (ref 11.5–15.5)
WBC: 9.2 10*3/uL (ref 4.0–10.5)

## 2017-10-08 MED ORDER — MESALAMINE 1.2 G PO TBEC: 2.4000 g | DELAYED_RELEASE_TABLET | Freq: Two times a day (BID) | ORAL | 3 refills | Status: DC

## 2017-10-08 MED ORDER — RANITIDINE HCL 150 MG PO TABS: ORAL_TABLET | ORAL | 0 refills | Status: DC

## 2017-10-08 NOTE — Progress Notes (Signed)
Subjective:    Patient ID: Lindsey [name], female    DOB: Mar 06, 1966, 51 y.o.   MRN: [id number]  HPI Sharetha Newson is a 51 year old female with a history of Crohn's colitis, history of iritis, hypertension, hyperlipidemia and history of kidney stones who is here for follow-up. She was last seen in February 2018. Been maintained on Lialda 4.8 g daily. She is here alone today.  At the time of her last visit she felt like her colitis symptoms were improving. She also made some dietary modifications and was using essential oils. Since that visit she feels that her symptoms have recurred and returned to what she was doing with prior to diagnosis. She has been using Lialda 2.4 g twice a day uninterrupted. She admits to not being as strict with her diet. She's having predominantly left-sided abdominal discomfort, loose stools usually 3-4 per day but can be as many as 8 stools per day. She seeing occasional blood in her stool. In general she can fill on well but also reports getting used to this symptom. Recently she's had more increase in heartburn. No dysphagia or odynophagia. Mild nausea no vomiting. No early satiety. She was given Zantac to use for allergy/hives but is not using this regularly.  Her last colonoscopy was in December 2017 which showed patchy ulcerated mucosa from ileocecal valve to rectosigmoid colon. Pathology showed markedly active chronic colitis with ulceration and no dysplasia. Findings consistent with IBD  Review of Systems As per history of present illness, otherwise negative  Current Medications, Allergies, Past Medical History, Past Surgical History, Family History and Social History were reviewed in [employer] [employer] record.     Objective:   Physical Exam BP 106/70   Pulse 62   Ht 5' 4.25" (1.632 m)   Wt 206 lb 4 oz (93.6 kg)   LMP 09/25/2017   BMI 35.13 kg/m  Constitutional: Well-developed and well-nourished. No distress. HEENT: Normocephalic  and atraumatic. Conjunctivae are normal.  No scleral icterus. Neck: Neck supple. Trachea midline. Cardiovascular: Normal rate, regular rhythm and intact distal pulses. No M/R/G Pulmonary/chest: Effort normal and breath sounds normal. No wheezing, rales or rhonchi. Abdominal: Soft, Mild abdominal tenderness more on the left abdomen, nondistended. Bowel sounds active throughout. There are no masses palpable. No hepatosplenomegaly. Extremities: no clubbing, cyanosis, or edema Neurological: Alert and oriented to person place and time. Skin: Skin is warm and dry. Psychiatric: Normal mood and affect. Behavior is normal.  CBC    Component Value Date/Time   WBC 9.2 10/08/2017 1547   RBC 4.44 10/08/2017 1547   HGB 13.4 10/08/2017 1547   HCT 39.4 10/08/2017 1547   PLT 250.0 10/08/2017 1547   MCV 88.7 10/08/2017 1547   MCH 27.8 01/05/2015 1235   MCHC 34.1 10/08/2017 1547   RDW 13.7 10/08/2017 1547   LYMPHSABS 2.9 10/08/2017 1547   MONOABS 0.8 10/08/2017 1547   EOSABS 0.2 10/08/2017 1547   BASOSABS 0.1 10/08/2017 1547   CMP     Component Value Date/Time   NA 137 10/08/2017 1547   K 3.7 10/08/2017 1547   CL 99 10/08/2017 1547   CO2 31 10/08/2017 1547   GLUCOSE 112 (H) 10/08/2017 1547   GLUCOSE 94 12/31/2006 1033   BUN 17 10/08/2017 1547   CREATININE 0.68 10/08/2017 1547   CREATININE 0.66 11/12/2015 1452   CALCIUM 9.2 10/08/2017 1547   PROT 7.1 10/08/2017 1547   ALBUMIN 4.1 10/08/2017 1547   AST 16 10/08/2017 1547   ALT 20  10/08/2017 1547   ALKPHOS 56 10/08/2017 1547   BILITOT 0.3 10/08/2017 1547   GFRNONAA >90 01/05/2015 1235   GFRAA >90 01/05/2015 1235       Assessment & Plan:  51 year old female with a history of Crohn's colitis, history of iritis, hypertension, hyperlipidemia and history of kidney stones who is here for follow-up.  1. Crohn's colitis, diagnosed December 2017 -- she has not had significant response to 5-ASA therapy. We discussed that this is not terribly  surprising given that she has Crohn's rather than ulcerative colitis. I feel that she would have a much better chance at improved symptoms and remission from Crohn's if we escalate therapy. We spent a great of time today discussing therapy including immunomodulators and Biologics. We discussed azathioprine, Humira and Entyvio. We also discussed the risks in great detail including the risk of infection (including reactivation of latent TB and underlying viral hepatitis), hepatotoxicity, leukopenia, pancreatitis, nausea, malignancy (specifically lymphoma), rash, demyelinating disease, and even heart failure.  She would like to discuss this with her husband and make a decision. We also discussed repeating colonoscopy prior to biologic therapy if this would make her feel more sure about the need to escalate therapy. In my opinion this is not an absolutely necessary step before proceeding with additional therapy.  Check CBC, CMP, CRP, quantiferon gold and TPMT today. She is asked to notify me soon with decision regarding change in therapy. She is happy with this plan  2. GERD -- will start with ranitidine 150 twice a day. Reassessment of symptoms at follow-up if not better PPI and consideration of endoscopy  25 minutes spent with the patient today. Greater than 50% was spent in counseling and coordination of care with the patient

## 2017-10-08 NOTE — Patient Instructions (Signed)
Your physician has requested that you go to the basement for the following lab work before leaving today: CBC, CMP, CRP, TPMT, Quant gold  We have sent the following medications to your pharmacy for you to pick up at your convenience: Lialda 4.8 grams daily Ranitidine 150 mg twice daily x 1 month  Azathioprine vs. Humira vs.Entyvio  If you are age 51 or older, your body mass index should be between 23-30. Your Body mass index is 35.13 kg/m. If this is out of the aforementioned range listed, please consider follow up with your Primary Care Provider.  If you are age 21 or younger, your body mass index should be between 19-25. Your Body mass index is 35.13 kg/m. If this is out of the aformentioned range listed, please consider follow up with your Primary Care Provider.

## 2017-10-08 NOTE — Telephone Encounter (Signed)
Patient scheduled with PCP for 10/23/2017

## 2017-10-09 ENCOUNTER — Other Ambulatory Visit: Payer: Self-pay | Admitting: Obstetrics & Gynecology

## 2017-10-09 DIAGNOSIS — R928 Other abnormal and inconclusive findings on diagnostic imaging of breast: Secondary | ICD-10-CM

## 2017-10-11 ENCOUNTER — Encounter: Payer: Self-pay | Admitting: Internal Medicine

## 2017-10-15 ENCOUNTER — Telehealth: Payer: Self-pay | Admitting: Internal Medicine

## 2017-10-15 ENCOUNTER — Encounter: Payer: Self-pay | Admitting: Pediatrics

## 2017-10-15 ENCOUNTER — Ambulatory Visit (INDEPENDENT_AMBULATORY_CARE_PROVIDER_SITE_OTHER): Payer: BLUE CROSS/BLUE SHIELD | Admitting: Pediatrics

## 2017-10-15 VITALS — BP 116/68 | HR 68 | Temp 98.1°F | Resp 16 | Ht 65.4 in | Wt 209.2 lb

## 2017-10-15 DIAGNOSIS — J4530 Mild persistent asthma, uncomplicated: Secondary | ICD-10-CM | POA: Diagnosis not present

## 2017-10-15 DIAGNOSIS — L501 Idiopathic urticaria: Secondary | ICD-10-CM

## 2017-10-15 DIAGNOSIS — J310 Chronic rhinitis: Secondary | ICD-10-CM

## 2017-10-15 MED ORDER — MONTELUKAST SODIUM 10 MG PO TABS: 10.0000 mg | ORAL_TABLET | Freq: Every day | ORAL | 5 refills | Status: DC

## 2017-10-15 MED ORDER — LEVOCETIRIZINE DIHYDROCHLORIDE 5 MG PO TABS: 5.0000 mg | ORAL_TABLET | Freq: Every evening | ORAL | 5 refills | Status: DC

## 2017-10-15 NOTE — Progress Notes (Addendum)
[place] [id number] Dept: [phone]  FOLLOW UP NOTE  Patient ID: Lindsey [name], female    DOB: June 27, 1966  Age: 51 y.o. MRN: [id number] Date of Office Visit: 10/15/2017  Assessment  Chief Complaint: Asthma (doing well)  HPI Lindsey [name] presents for follow up of mild intermittent asthma, idiopathic urticaria, and chronic rhinitis. She was last seen in this office by Dr. Verlin Fester on 08/23/2016. At that time she reported her urticaria as mostly controlled with a few breakthrough hives. She reported her asthma and rhinitis as well controlled and she had not been using a medicated nasal spray at that time.  Since that visit, Lindsey [name]'s asthma has been well controlled. She has required rescue medication about three times a year which is usually happened with cold. She has not experienced nocturnal awakenings due to lower respiratory symptoms, nor have activities of daily living been limited. She has required no Emergency Department or Urgent Care visits for her asthma. She has required zero courses of systemic steroids for asthma exacerbations since the last visit. She reports an occasional breakthrough hives that seem to be related to pressure which clear without additional medications. Lindsey [name] reports her rhinitis is well controlled without the use of nasal sprays.   Current medications include: montelukast 10 mg daily, levocetirizine 5 mg daily, ranitidine 150 mg twice a day, Proventil- inhale 2 puffs every 4 hours as needed for wheezing or shortness of breath, fluticasone nasal spray- 2 sprays in each nostril daily as needed. Other medications as outlined in the chart.    Drug Allergies:  Allergies  Allergen Reactions  . Azithromycin Hives  . Nitrofurantoin Other (See Comments)    abd pain    Physical Exam: BP 116/68 (BP Location: Left Arm, Patient Position: Sitting, Cuff Size: Large)   Pulse 68   Temp 98.1 F (36.7 C) (Oral)   Resp 16   Ht 5' 5.4"  (1.661 m)   Wt 209 lb 3.2 oz (94.9 kg)   LMP 09/25/2017   SpO2 97%   BMI 34.39 kg/m    Physical Exam  Constitutional: She is oriented to person, place, and time. She appears well-developed and well-nourished.  HENT:  Right Ear: External ear normal.  Left Ear: External ear normal.  Nares normal. Pharynx normal  Eyes:  Eyes normal.  Neck: Normal range of motion. Neck supple.  Cardiovascular:  S1-S2 normal. Regular heart rate and rhythm  Pulmonary/Chest:  Lungs clear to auscultation  Musculoskeletal: Normal range of motion.  Neurological: She is alert and oriented to person, place, and time.  Skin: Skin is warm and dry.  Psychiatric: She has a normal mood and affect. Her behavior is normal.    Diagnostics: FEV1: 2.29, FVC: 2.62. Predicted FEV1: 2.87, predicted FVC: 3.64. At the last visit on 08/23/2016 FVC was 3.13 and today FVC is 2.62. This patient reports she has gained a significant amount of weight this year possibly reducing FVC. Today's spirometry is in the normal range.    Assessment and Plan: 1. Mild persistent asthma without complication   2. Idiopathic urticaria   3. Chronic rhinitis     Meds ordered this encounter  Medications  . montelukast (SINGULAIR) 10 MG tablet    Sig: Take 1 tablet (10 mg total) by mouth at bedtime.    Dispense:  30 tablet    Refill:  5  . levocetirizine (XYZAL) 5 MG tablet    Sig: Take 1 tablet (5 mg total) by mouth every evening.  Dispense:  30 tablet    Refill:  5    Patient Instructions:  Continue taking montelukast 10 mg a day for coughing or wheezing Continue levocetirizine 5 mg by mouth every evening to control hives Continue ranitidine 150 mg 2 times a day to control hives Continue Proventil HFA/inhale 2 puffs into the lungs every 4 hours as needed for coughing or shortness of breath Continue fluticasone nasal spray-2 sprays in each nostril once a day for nasal congestion Nasal saline spray as needed prior to medicated  nasal sprays Continue taking other medications as outlined in your cart You should have a flu shot Call me if you are not doing well on this treatment plan Follow-up in one year  Return in about 1 year (around 10/15/2018), or if symptoms worsen or fail to improve.   Lindsey [name] was seen by Dr. Shaune Leeks in clinic today.  Thank you for the opportunity to care for this patient.  Please do not hesitate to contact me with questions.  Penne Lash, M.D.  [facility] [address] [place], [place] [address] [phone]

## 2017-10-15 NOTE — Telephone Encounter (Signed)
Patient would like to try Humira

## 2017-10-15 NOTE — Patient Instructions (Addendum)
Continue taking montelukast 10 mg a day. Continue levocetirizine 5 mg by mouth every evening Continue ranitidine 150 mg 2 times a day Continue Proventil HFA/inhale 2 puffs into the lungs every 4 hours as needed for coughing or shortness of breath Continue fluticasone nasal spray-2 sprays in each nostril once a day for nasal congestion Nasal saline spray as needed prior to medicated nasal sprays Continue taking other medications as outlined in your cart You should have a flu shot Call me if you are not doing well on this treatment plan Follow-up in one year

## 2017-10-15 NOTE — Telephone Encounter (Signed)
Okay to start citrate free Humira with standard induction followed by 40 mg every 14 days She will need hepatitis B surface antigen, surface antibody and core total, hepatitis C antibody test prior to starting Humira She should have annual flu vaccine She should have Pneumovax if she has never had

## 2017-10-16 DIAGNOSIS — J4530 Mild persistent asthma, uncomplicated: Secondary | ICD-10-CM | POA: Insufficient documentation

## 2017-10-16 LAB — QUANTIFERON TB GOLD ASSAY (BLOOD)
Mitogen-Nil: >10 IU/mL
QUANTIFERON(R)-TB GOLD: NEGATIVE
Quantiferon Nil Value: 0.02 IU/mL
Quantiferon Tb Ag Minus Nil Value: <0 IU/mL

## 2017-10-16 LAB — THIOPURINE METHYLTRANSFERASE (TPMT), RBC: Thiopurine Methyltransferase, RBC: 8 nmol/hr/mL RBC — ABNORMAL LOW

## 2017-10-17 ENCOUNTER — Other Ambulatory Visit: Payer: BLUE CROSS/BLUE SHIELD

## 2017-10-17 ENCOUNTER — Other Ambulatory Visit: Payer: Self-pay

## 2017-10-17 ENCOUNTER — Other Ambulatory Visit: Payer: Self-pay | Admitting: Obstetrics & Gynecology

## 2017-10-17 ENCOUNTER — Ambulatory Visit
Admission: RE | Admit: 2017-10-17 | Discharge: 2017-10-17 | Disposition: A | Discharge status: Home / Self Care | Payer: BLUE CROSS/BLUE SHIELD | Source: Ambulatory Visit | Attending: Obstetrics & Gynecology | Admitting: Obstetrics & Gynecology

## 2017-10-17 DIAGNOSIS — R928 Other abnormal and inconclusive findings on diagnostic imaging of breast: Secondary | ICD-10-CM

## 2017-10-17 DIAGNOSIS — K5010 Crohn's disease of large intestine without complications: Secondary | ICD-10-CM

## 2017-10-17 NOTE — Telephone Encounter (Signed)
Pt aware, lab orders in epic. Pt states she will come today for the labs. She reports she has had the flu vaccine and will get pneumovax next week when she sees her PCP. Humira paperwork started with [facility].

## 2017-10-18 LAB — HEPATITIS B CORE ANTIBODY, TOTAL: Hep B Core Total Ab: NONREACTIVE

## 2017-10-18 LAB — HEPATITIS B SURFACE ANTIBODY,QUALITATIVE: Hep B S Ab: NONREACTIVE

## 2017-10-18 LAB — HEPATITIS C ANTIBODY
Hepatitis C Ab: NONREACTIVE
SIGNAL TO CUT-OFF: 0.01 (ref <1.00)

## 2017-10-18 LAB — HEPATITIS B SURFACE ANTIGEN: Hepatitis B Surface Ag: NONREACTIVE

## 2017-10-18 NOTE — Telephone Encounter (Signed)
Humira paperwork faxed to [facility] pharmacy.

## 2017-10-18 NOTE — Telephone Encounter (Signed)
Attempted to call pt and received message that voicemail box is full and cannot accept messages. Will try again.

## 2017-10-22 ENCOUNTER — Other Ambulatory Visit: Payer: Self-pay | Admitting: Obstetrics & Gynecology

## 2017-10-22 ENCOUNTER — Ambulatory Visit
Admission: RE | Admit: 2017-10-22 | Discharge: 2017-10-22 | Disposition: A | Discharge status: Home / Self Care | Payer: BLUE CROSS/BLUE SHIELD | Source: Ambulatory Visit | Attending: Obstetrics & Gynecology | Admitting: Obstetrics & Gynecology

## 2017-10-22 DIAGNOSIS — N6320 Unspecified lump in the left breast, unspecified quadrant: Secondary | ICD-10-CM

## 2017-10-22 DIAGNOSIS — R928 Other abnormal and inconclusive findings on diagnostic imaging of breast: Secondary | ICD-10-CM

## 2017-10-23 ENCOUNTER — Encounter: Payer: Self-pay | Admitting: Family Medicine

## 2017-10-23 ENCOUNTER — Telehealth: Payer: Self-pay | Admitting: Internal Medicine

## 2017-10-23 ENCOUNTER — Ambulatory Visit (INDEPENDENT_AMBULATORY_CARE_PROVIDER_SITE_OTHER): Payer: BLUE CROSS/BLUE SHIELD | Admitting: Family Medicine

## 2017-10-23 VITALS — BP 95/65 | HR 74 | Temp 98.0°F | Ht 64.0 in | Wt 206.0 lb

## 2017-10-23 DIAGNOSIS — Z23 Encounter for immunization: Secondary | ICD-10-CM

## 2017-10-23 DIAGNOSIS — K50119 Crohn's disease of large intestine with unspecified complications: Secondary | ICD-10-CM

## 2017-10-23 DIAGNOSIS — I10 Essential (primary) hypertension: Secondary | ICD-10-CM | POA: Diagnosis not present

## 2017-10-23 DIAGNOSIS — E785 Hyperlipidemia, unspecified: Secondary | ICD-10-CM | POA: Diagnosis not present

## 2017-10-23 MED ORDER — OLMESARTAN MEDOXOMIL 20 MG PO TABS: 20.0000 mg | ORAL_TABLET | Freq: Every day | ORAL | 3 refills | Status: DC

## 2017-10-23 NOTE — Telephone Encounter (Signed)
Noted  

## 2017-10-23 NOTE — Patient Instructions (Signed)

## 2017-10-23 NOTE — Assessment & Plan Note (Signed)
Per GI Pt to start humira twinrix #1 and pneum 23 given today rto 1 month for twinrix #2 and at 6 months for #3

## 2017-10-23 NOTE — Progress Notes (Signed)
Patient ID: Lindsey [name], female    DOB: [birth year]  Age: 51 y.o. MRN: [id number]    Subjective:  Subjective  HPI Lindsey [name] presents for f/u bp and cholesterol   Her bp has been running low.  No other complaints.  She will be starting humira and needs twinrix and pneumovac.       Review of Systems  Constitutional: Positive for fatigue. Negative for activity change, appetite change and unexpected weight change.  Respiratory: Negative for cough and shortness of breath.   Cardiovascular: Negative for chest pain and palpitations.  Psychiatric/Behavioral: Negative for behavioral problems and dysphoric mood. The patient is not nervous/anxious.     History Past Medical History:  Diagnosis Date  . Allergy   . Anxiety   . Colitis   . Crohn's disease ([place])   . Depression   . Diverticulosis   . Hyperlipidemia   . Hypertension   . Mild intermittent asthma    rarely uses inhaler  . Nephrolithiasis     She has a past surgical history that includes LEEP; Lithotripsy; Wisdom tooth extraction; and Combined hysteroscopy diagnostic / D&C.   Her family history includes Allergic rhinitis in her mother; Alzheimer's disease in her father; Asthma in her brother; Dementia in her father; Diabetes in her unknown relative; Food Allergy in her mother; Hyperlipidemia in her unknown relative; Hypertension in her unknown relative.She reports that she quit smoking about 21 years ago. Her smoking use included Cigarettes. She has a 10.00 pack-year smoking history. She has never used smokeless tobacco. She reports that she drinks alcohol. She reports that she does not use drugs.  Current Outpatient Prescriptions on File Prior to Visit  Medication Sig Dispense Refill  . ezetimibe (ZETIA) 10 MG tablet TAKE ONE (1) TABLET BY MOUTH EVERY DAY. 90 tablet 1  . FLUoxetine (PROZAC) 20 MG capsule TAKE ONE (1) CAPSULE EACH DAY 90 capsule 3  . indapamide (LOZOL) 1.25 MG tablet Take 1.25 mg by mouth daily.     Marland Kitchen  levocetirizine (XYZAL) 5 MG tablet Take 1 tablet (5 mg total) by mouth every evening. 30 tablet 5  . mesalamine (LIALDA) 1.2 g EC tablet Take 2 tablets (2.4 g total) by mouth 2 (two) times daily. 120 tablet 3  . montelukast (SINGULAIR) 10 MG tablet Take 1 tablet (10 mg total) by mouth at bedtime. 30 tablet 5  . omega-3 acid ethyl esters (LOVAZA) 1 g capsule Take 2 capsules (2 g total) by mouth 2 (two) times daily. 120 capsule 1  . potassium citrate (UROCIT-K) 10 MEQ (1080 MG) SR tablet Take 2 tablets (20 mEq total) by mouth daily. 60 tablet 5  . ranitidine (ZANTAC) 150 MG tablet TAKE ONE TABLET ONE TO TWO TIMES A DAY AS DIRECTED. 60 tablet 0  . albuterol (PROVENTIL HFA;VENTOLIN HFA) 108 (90 Base) MCG/ACT inhaler Inhale 2 puffs into the lungs every 4 (four) hours as needed for wheezing or shortness of breath. (Patient not taking: Reported on 10/23/2017) 1 Inhaler 2  . ALPRAZolam (XANAX) 0.5 MG tablet Take 1 tablet (0.5 mg total) by mouth as needed for anxiety. (Patient not taking: Reported on 10/23/2017) 10 tablet 0  . Cyanocobalamin (VITAMIN B 12 PO) Take 1 tablet by mouth daily.     . Probiotic Product (PROBIOTIC ADVANCED PO) Take 1 capsule by mouth daily. Reported on 06/08/2016     Current Facility-Administered Medications on File Prior to Visit  Medication Dose Route Frequency Provider Last Rate Last Dose  . 0.9 %  sodium chloride infusion  500 mL Intravenous Continuous Pyrtle, Lajuan Lines, MD         Objective:  Objective  Physical Exam  Constitutional: She is oriented to person, place, and time. She appears well-developed and well-nourished.  HENT:  Head: Normocephalic and atraumatic.  Eyes: Conjunctivae and EOM are normal.  Neck: Normal range of motion. Neck supple. No JVD present. Carotid bruit is not present. No thyromegaly present.  Cardiovascular: Normal rate, regular rhythm and normal heart sounds.   No murmur heard. Pulmonary/Chest: Effort normal and breath sounds normal. No  respiratory distress. She has no wheezes. She has no rales. She exhibits no tenderness.  Musculoskeletal: She exhibits no edema.  Neurological: She is alert and oriented to person, place, and time.  Psychiatric: She has a normal mood and affect.  Nursing note and vitals reviewed.  BP 95/65   Pulse 74   Temp 98 F (36.7 C) (Oral)   Ht 5' 4"  (1.626 m)   Wt 206 lb (93.4 kg)   LMP 09/25/2017   SpO2 96%   BMI 35.36 kg/m  Wt Readings from Last 3 Encounters:  10/23/17 206 lb (93.4 kg)  10/15/17 209 lb 3.2 oz (94.9 kg)  10/08/17 206 lb 4 oz (93.6 kg)     Lab Results  Component Value Date   WBC 9.2 10/08/2017   HGB 13.4 10/08/2017   HCT 39.4 10/08/2017   PLT 250.0 10/08/2017   GLUCOSE 112 (H) 10/08/2017   CHOL 198 10/24/2016   TRIG 94.0 10/24/2016   HDL 63.40 10/24/2016   LDLDIRECT 123.2 12/20/2011   LDLCALC 115 (H) 10/24/2016   ALT 20 10/08/2017   AST 16 10/08/2017   NA 137 10/08/2017   K 3.7 10/08/2017   CL 99 10/08/2017   CREATININE 0.68 10/08/2017   BUN 17 10/08/2017   CO2 31 10/08/2017   TSH 2.00 10/24/2016   HGBA1C 5.8 10/24/2016    Mm Diag Breast Tomo Uni Left  Result Date: 10/22/2017 CLINICAL DATA:  The patient presented for biopsy of a left breast mass in the upper outer left breast. The mass was not clearly seen on stereotactic images. As a result, repeat full paddle images were obtained. The mass was much smaller and conspicuous on today's study. EXAM: 2D DIGITAL DIAGNOSTIC UNILATERAL LEFT MAMMOGRAM WITH CAD AND ADJUNCT TOMO COMPARISON:  Previous exam(s). ACR Breast Density Category b: There are scattered areas of fibroglandular density. FINDINGS: The mass in the upper outer left breast is smaller and less conspicuous today. Mammographic images were processed with CAD. IMPRESSION: The mass could not be seen in the standard stereotactic paddle. On repeat full paddle imaging, the mass was smaller and less conspicuous in the interval. As a result, the stereotactic  biopsy was canceled. RECOMMENDATION: Options of surgical resection, breast MRI, and three-month follow-up were discussed with the patient. It was decided to follow the mass, smaller in the interval, in 3 months with mammography. I have discussed the findings and recommendations with the patient. Results were also provided in writing at the conclusion of the visit. If applicable, a reminder letter will be sent to the patient regarding the next appointment. BI-RADS CATEGORY  3: Probably benign. Electronically Signed   By: Dorise Bullion III M.D   On: 10/22/2017 10:44     Assessment & Plan:  Plan  I have discontinued Ms. Glantz's olmesartan. I am also having her start on olmesartan. Additionally, I am having her maintain her Cyanocobalamin (VITAMIN B 12 PO), Probiotic Product (  PROBIOTIC ADVANCED PO), albuterol, potassium citrate, indapamide, ALPRAZolam, FLUoxetine, ezetimibe, omega-3 acid ethyl esters, mesalamine, ranitidine, montelukast, and levocetirizine. We will continue to administer sodium chloride.  Meds ordered this encounter  Medications  . olmesartan (BENICAR) 20 MG tablet    Sig: Take 1 tablet (20 mg total) by mouth daily.    Dispense:  90 tablet    Refill:  3    Problem List Items Addressed This Visit      Unprioritized   Crohn's colitis ([place])    Per GI Pt to start humira twinrix #1 and pneum 23 given today rto 1 month for twinrix #2 and at 6 months for #3      Essential hypertension - Primary    Well controlled, no changes to meds. Encouraged heart healthy diet such as the DASH diet and exercise as tolerated.       Relevant Medications   olmesartan (BENICAR) 20 MG tablet   Other Relevant Orders   Lipid panel   Comprehensive metabolic panel   Hyperlipidemia LDL goal <100    Encouraged heart healthy diet, increase exercise, avoid trans fats, consider a krill oil cap daily      Relevant Medications   olmesartan (BENICAR) 20 MG tablet   Other Relevant Orders   Lipid  panel   Comprehensive metabolic panel      Follow-up: Return if symptoms worsen or fail to improve.  Ann Held, DO

## 2017-10-23 NOTE — Assessment & Plan Note (Signed)
Well controlled, no changes to meds. Encouraged heart healthy diet such as the DASH diet and exercise as tolerated.  °

## 2017-10-23 NOTE — Assessment & Plan Note (Signed)
Encouraged heart healthy diet, increase exercise, avoid trans fats, consider a krill oil cap daily 

## 2017-10-25 ENCOUNTER — Telehealth: Payer: Self-pay | Admitting: Internal Medicine

## 2017-10-26 ENCOUNTER — Other Ambulatory Visit (INDEPENDENT_AMBULATORY_CARE_PROVIDER_SITE_OTHER): Payer: BLUE CROSS/BLUE SHIELD

## 2017-10-26 DIAGNOSIS — I10 Essential (primary) hypertension: Secondary | ICD-10-CM | POA: Diagnosis not present

## 2017-10-26 DIAGNOSIS — E785 Hyperlipidemia, unspecified: Secondary | ICD-10-CM | POA: Diagnosis not present

## 2017-10-26 LAB — COMPREHENSIVE METABOLIC PANEL
ALT: 16 U/L (ref 0–35)
AST: 15 U/L (ref 0–37)
Albumin: 4 g/dL (ref 3.5–5.2)
Alkaline Phosphatase: 63 U/L (ref 39–117)
BUN: 21 mg/dL (ref 6–23)
CO2: 32 mEq/L (ref 19–32)
Calcium: 9.2 mg/dL (ref 8.4–10.5)
Chloride: 100 mEq/L (ref 96–112)
Creatinine, Ser: 0.73 mg/dL (ref 0.40–1.20)
GFR: 89.3 mL/min (ref >60.00)
Glucose, Bld: 110 mg/dL — ABNORMAL HIGH (ref 70–99)
Potassium: 4 mEq/L (ref 3.5–5.1)
Sodium: 139 mEq/L (ref 135–145)
Total Bilirubin: 0.6 mg/dL (ref 0.2–1.2)
Total Protein: 7.1 g/dL (ref 6.0–8.3)

## 2017-10-26 LAB — LIPID PANEL
Cholesterol: 186 mg/dL (ref 0–200)
HDL: 61.6 mg/dL (ref >39.00)
LDL Cholesterol: 107 mg/dL — ABNORMAL HIGH (ref 0–99)
NonHDL: 124.01
Total CHOL/HDL Ratio: 3
Triglycerides: 86 mg/dL (ref 0.0–149.0)
VLDL: 17.2 mg/dL (ref 0.0–40.0)

## 2017-10-29 NOTE — Telephone Encounter (Signed)
[facility] states the prior Josem Kaufmann has been done for the Humira but the pt will have to get the drug from [facility]. Script will be transferred there and she should hear from them soon. Pt aware.

## 2017-10-30 ENCOUNTER — Other Ambulatory Visit: Payer: Self-pay | Admitting: Family Medicine

## 2017-10-30 DIAGNOSIS — E7800 Pure hypercholesterolemia, unspecified: Secondary | ICD-10-CM

## 2017-10-30 DIAGNOSIS — R7309 Other abnormal glucose: Secondary | ICD-10-CM

## 2017-11-05 ENCOUNTER — Other Ambulatory Visit: Payer: Self-pay

## 2017-11-05 MED ORDER — ADALIMUMAB 40 MG/0.4ML ~~LOC~~ AJKT: 40.0000 mg | AUTO-INJECTOR | SUBCUTANEOUS | 11 refills | Status: DC

## 2017-11-05 MED ORDER — ADALIMUMAB 40 MG/0.8ML ~~LOC~~ AJKT: 40.0000 mg | AUTO-INJECTOR | SUBCUTANEOUS | 0 refills | Status: DC

## 2017-11-05 NOTE — Telephone Encounter (Signed)
Scripts sent to pharmacy per pt request.

## 2017-11-05 NOTE — Telephone Encounter (Signed)
Patient wants to try to send medication humira to [facility] in [facility]

## 2017-11-07 ENCOUNTER — Telehealth: Payer: Self-pay | Admitting: Internal Medicine

## 2017-11-07 NOTE — Telephone Encounter (Signed)
PA was completed online at [email or web address]. And sent to the plan. Now waiting for ok from [facility]. Left message on voicemail for pt explaining waiting on PA.

## 2017-11-22 ENCOUNTER — Ambulatory Visit (INDEPENDENT_AMBULATORY_CARE_PROVIDER_SITE_OTHER): Payer: BLUE CROSS/BLUE SHIELD

## 2017-11-22 ENCOUNTER — Other Ambulatory Visit: Payer: Self-pay | Admitting: Internal Medicine

## 2017-11-22 ENCOUNTER — Telehealth: Payer: Self-pay | Admitting: Internal Medicine

## 2017-11-22 DIAGNOSIS — Z23 Encounter for immunization: Secondary | ICD-10-CM

## 2017-11-22 DIAGNOSIS — K50111 Crohn's disease of large intestine with rectal bleeding: Secondary | ICD-10-CM

## 2017-11-22 NOTE — Telephone Encounter (Signed)
Left message for patient to call back. I indicated on electronic refill request that we are denying rx at the moment. When denying refill, we must give a reason, so I indicated we need to know how she is doing (as per Dr Vena Rua last office note).

## 2017-11-22 NOTE — Telephone Encounter (Signed)
I believe you meant Lialda given she is now on Humira, if so then yes, she should remain on both

## 2017-11-22 NOTE — Telephone Encounter (Signed)
Pt calling and wants to know if she needs to continue taking the lialda since she is now taking Lialda. Please advise.

## 2017-11-22 NOTE — Telephone Encounter (Signed)
[facility] states that they received a denial letter for medication zantac and it states "needing to know how pt is doing on medication." Pharmacy wants to know what to do in regards to medication, and states they did not reach out to pt for that is not their responsibility. Please advise.

## 2017-11-23 ENCOUNTER — Ambulatory Visit: Payer: BLUE CROSS/BLUE SHIELD

## 2017-11-23 NOTE — Telephone Encounter (Signed)
Patient returned phone call. [facility] # [phone]

## 2017-11-23 NOTE — Telephone Encounter (Signed)
Pt aware and knows to continue Lialda with the Humira.

## 2017-11-23 NOTE — Telephone Encounter (Signed)
Left voicemail for patient to call back. 

## 2017-11-26 MED ORDER — RANITIDINE HCL 150 MG PO TABS: ORAL_TABLET | ORAL | 4 refills | Status: DC

## 2017-11-26 NOTE — Telephone Encounter (Signed)
Patient states that she has done quite well on the zantac twice daily dosing and is no longer having reflux symptoms. Per Dr Hilarie Fredrickson, as long as patient is doing well, she may have refills. Rx for zantac sent to pharmacy.

## 2017-11-28 ENCOUNTER — Telehealth: Payer: Self-pay | Admitting: Internal Medicine

## 2017-11-28 NOTE — Telephone Encounter (Signed)
Humira would be an unlikely cause of swelling in her hands, but I cannot fully exclude this given that it is a new medicine Are her legs swollen? Any other new symptoms, rash, etc.? If not would recommend she proceed with repeat dosing and monitor for any changes, letting me know if any as soon as possible  I am sorry to hear of her mother's death

## 2017-11-28 NOTE — Telephone Encounter (Signed)
Pt states she took her first dose of Humira 2 weeks ago and she has noticed that her hands are swollen, reports her rings will not fit. Her mother passed away 2 days ago and she is currently in [place]. States she does not thinks she has been eating increased sodium. She is due to take her next dose tomorrow. Please advise.

## 2017-11-28 NOTE — Telephone Encounter (Signed)
Spoke with pt and she is aware.

## 2017-12-11 ENCOUNTER — Encounter: Payer: Self-pay | Admitting: Pediatrics

## 2017-12-11 ENCOUNTER — Ambulatory Visit (INDEPENDENT_AMBULATORY_CARE_PROVIDER_SITE_OTHER): Payer: BLUE CROSS/BLUE SHIELD | Admitting: Pediatrics

## 2017-12-11 VITALS — BP 112/64 | HR 70 | Temp 98.3°F | Resp 16

## 2017-12-11 DIAGNOSIS — K5090 Crohn's disease, unspecified, without complications: Secondary | ICD-10-CM | POA: Diagnosis not present

## 2017-12-11 DIAGNOSIS — J4530 Mild persistent asthma, uncomplicated: Secondary | ICD-10-CM | POA: Diagnosis not present

## 2017-12-11 DIAGNOSIS — L501 Idiopathic urticaria: Secondary | ICD-10-CM | POA: Diagnosis not present

## 2017-12-11 DIAGNOSIS — J310 Chronic rhinitis: Secondary | ICD-10-CM | POA: Diagnosis not present

## 2017-12-11 NOTE — Progress Notes (Signed)
  [place] [id number] Dept: [phone]  FOLLOW UP NOTE  Patient ID: Lindsey [name], female    DOB: 06/09/1966  Age: 51 y.o. MRN: [id number] Date of Office Visit: 12/11/2017  Assessment  Chief Complaint: Urticaria (x 2 weeks.  pt not taking Xyzal.  )  HPI Lindsey [name] presents for for follow-up of chronic urticaria. She is not using Xyzal twice a day She has  mild itching . She started Humira a month ago because of Crohn's disease. Her asthma is well controlled. Her nasal symptoms are well controlled.  Current and revised medications will be outlined in the after visit summary   Drug Allergies:  Allergies  Allergen Reactions  . Azithromycin Hives  . Nitrofurantoin Other (See Comments)    abd pain    Physical Exam: BP 112/64 (BP Location: Left Arm, Patient Position: Sitting, Cuff Size: Normal)   Pulse 70   Temp 98.3 F (36.8 C) (Oral)   Resp 16   SpO2 97%    Physical Exam  Constitutional: She is oriented to person, place, and time.  HENT:  Eyes normal. Ears normal. Nose normal. Pharynx normal.  Neck: Neck supple. No thyromegaly present.  Cardiovascular:  S1 and S2 normal no murmurs  Pulmonary/Chest:  Clear to percussion and auscultation  Lymphadenopathy:    She has no cervical adenopathy.  Neurological: She is alert and oriented to person, place, and time.  Skin:  Clear  Psychiatric: She has a normal mood and affect. Her behavior is normal. Judgment and thought content normal.  Vitals reviewed.   Diagnostics:  FVC 2.92 L FEV1 2.41 L. Predicted FVC 3.64 L predicted FEV1 2.87 L-the spirometry is in the normal range  Assessment and Plan: 1. Mild persistent asthma without complication   2. Crohn's disease without complication, unspecified gastrointestinal tract location ([place])   3. Idiopathic urticaria   4. Chronic rhinitis        Patient Instructions  Levocetirizine 5 mg twice a day Ranitidine 150 mg twice a day Montelukast  10  mg once a day Proventil 2 puffs every 4 hours if needed for wheezing or coughing spells Fluticasone 2 sprays per nostril once a day if needed for stuffy nose Continue on your other medications Call me if you are not doing well on this treatment plan If you need a probiotic, discuss with your gastroenterologist the use of VSL3   Return in about 3 months (around 03/11/2018).    Thank you for the opportunity to care for this patient.  Please do not hesitate to contact me with questions.  Penne Lash, M.D.  [facility] [address] [place], [place] [address] [phone]

## 2017-12-11 NOTE — Patient Instructions (Addendum)
Levocetirizine 5 mg twice a day Ranitidine 150 mg twice a day Montelukast  10 mg once a day Proventil 2 puffs every 4 hours if needed for wheezing or coughing spells Fluticasone 2 sprays per nostril once a day if needed for stuffy nose Continue on your other medications Call me if you are not doing well on this treatment plan If you need a probiotic, discuss with your gastroenterologist the use of VSL3

## 2017-12-26 ENCOUNTER — Encounter: Payer: Self-pay | Admitting: Family Medicine

## 2017-12-27 ENCOUNTER — Other Ambulatory Visit: Payer: Self-pay | Admitting: Family Medicine

## 2017-12-27 DIAGNOSIS — T753XXA Motion sickness, initial encounter: Secondary | ICD-10-CM

## 2017-12-27 MED ORDER — SCOPOLAMINE 1 MG/3DAYS TD PT72: 1.0000 | MEDICATED_PATCH | TRANSDERMAL | 0 refills | Status: DC

## 2018-01-02 ENCOUNTER — Other Ambulatory Visit: Payer: Self-pay | Admitting: Internal Medicine

## 2018-01-22 ENCOUNTER — Ambulatory Visit: Payer: BLUE CROSS/BLUE SHIELD | Admitting: Pediatrics

## 2018-01-23 ENCOUNTER — Ambulatory Visit
Admission: RE | Admit: 2018-01-23 | Discharge: 2018-01-23 | Disposition: A | Discharge status: Home / Self Care | Payer: BLUE CROSS/BLUE SHIELD | Source: Ambulatory Visit | Attending: Obstetrics & Gynecology | Admitting: Obstetrics & Gynecology

## 2018-01-23 ENCOUNTER — Other Ambulatory Visit: Payer: Self-pay | Admitting: Obstetrics & Gynecology

## 2018-01-23 DIAGNOSIS — N6320 Unspecified lump in the left breast, unspecified quadrant: Secondary | ICD-10-CM

## 2018-01-30 ENCOUNTER — Telehealth: Payer: Self-pay | Admitting: Internal Medicine

## 2018-01-30 NOTE — Telephone Encounter (Signed)
Pts last OV was in October. She has been taking Humira now for about 2 1/2  Months and wants to know when she needs to schedule a follow-up appt. Please advise.

## 2018-02-01 ENCOUNTER — Telehealth: Payer: Self-pay | Admitting: Family Medicine

## 2018-02-01 ENCOUNTER — Other Ambulatory Visit: Payer: Self-pay

## 2018-02-01 DIAGNOSIS — F411 Generalized anxiety disorder: Secondary | ICD-10-CM

## 2018-02-01 MED ORDER — FLUOXETINE HCL 20 MG PO CAPS: ORAL_CAPSULE | ORAL | 0 refills | Status: DC

## 2018-02-01 NOTE — Telephone Encounter (Signed)
Copied from [place] [id number]. Topic: Quick Communication - Rx Refill/Question >> Feb 01, 2018  2:36 PM Yvette Rack wrote: Medication:  FLUoxetine (PROZAC) 20 MG capsule   patient has 2 days left    Has the patient contacted their pharmacy? Yes.  They faxed over request on Tuesday   (Agent: If no, request that the patient contact the pharmacy for the refill.)   Preferred Pharmacy (with phone number or street name): [place], [place] - [address] [phone] (Phone) [phone] (Fax     Agent: Please be advised that RX refills may take up to 3 business days. We ask that you follow-up with your pharmacy.

## 2018-02-01 NOTE — Telephone Encounter (Signed)
Pt scheduled to see Dr. Hilarie Fredrickson 04/11/18@9am . Pt aware of appt.

## 2018-02-01 NOTE — Telephone Encounter (Signed)
Needs an appt scheduled now, next available

## 2018-02-08 ENCOUNTER — Telehealth: Payer: Self-pay | Admitting: Internal Medicine

## 2018-02-08 NOTE — Telephone Encounter (Signed)
Pt has contacted her ambassador and she is going to work on getting her a replacement. She will go ahead and give that dose and then go 14 days after that dose.

## 2018-02-08 NOTE — Telephone Encounter (Signed)
I would ask the drug rep for a sample due to the miss-fire with her last dose. Take dose when sample is available and then every 14 days thereafter as before

## 2018-02-08 NOTE — Telephone Encounter (Signed)
Pt states that yesterday when she was doing her Humira injection she jerked and she did not get all of the medication. States she usually counts to 20 when taking the Humira and she only got to 8. Pt wants to know what she needs to do, if Dr. Hilarie Fredrickson wants her to wait 2 weeks to do the next injection. Please advise.

## 2018-02-12 ENCOUNTER — Other Ambulatory Visit: Payer: Self-pay | Admitting: *Deleted

## 2018-02-12 MED ORDER — OMEGA-3-ACID ETHYL ESTERS 1 G PO CAPS: 2.0000 | ORAL_CAPSULE | Freq: Two times a day (BID) | ORAL | 1 refills | Status: DC

## 2018-02-19 ENCOUNTER — Telehealth: Payer: Self-pay | Admitting: Internal Medicine

## 2018-02-19 NOTE — Telephone Encounter (Signed)
Spoke with pt and she is aware.

## 2018-02-19 NOTE — Telephone Encounter (Signed)
Would hold it for a couple days (2-3) and then if no fever and she is recovering/feeling better, then she can take her dose as usual If she develops fever > 100.7 she should not take her Humira and should see her PCP and be swabbed for influenza

## 2018-02-19 NOTE — Telephone Encounter (Signed)
Pt states she started getting a cold on Saturday, she is due to take her next Humira injection tomorrow. She has had no fever, has congestion in her chest, and headache.  Pt wants to know what to do regarding her Humira injection. Please advise.

## 2018-02-20 ENCOUNTER — Ambulatory Visit: Payer: BLUE CROSS/BLUE SHIELD | Admitting: Family Medicine

## 2018-02-20 ENCOUNTER — Encounter: Payer: Self-pay | Admitting: Family Medicine

## 2018-02-20 VITALS — BP 110/80 | HR 69 | Temp 97.6°F | Ht 64.0 in | Wt 211.2 lb

## 2018-02-20 DIAGNOSIS — J0100 Acute maxillary sinusitis, unspecified: Secondary | ICD-10-CM | POA: Diagnosis not present

## 2018-02-20 MED ORDER — AMOXICILLIN-POT CLAVULANATE 875-125 MG PO TABS: 1.0000 | ORAL_TABLET | Freq: Two times a day (BID) | ORAL | 0 refills | Status: DC

## 2018-02-20 NOTE — Patient Instructions (Signed)
OK to take Tylenol 1000 mg (2 extra strength tabs) or 975 mg (3 regular strength tabs) every 6 hours as needed.  Continue to push fluids, practice good hand hygiene, and cover your mouth if you cough.  If you start having fevers, shaking or shortness of breath, seek immediate care.  Most sinus infections are viral in etiology and antibiotics will not be helpful. That being said, if you start having worsening symptoms over 3 days, you are worsening by day 10 or not improving by day 14, go ahead and take it. You are on Day 4 as of now.

## 2018-02-20 NOTE — Progress Notes (Signed)
Chief Complaint  Patient presents with  . Sinusitis    Lindsey [name] here for URI complaints.  Duration: 4 days  Associated symptoms: sinus headache, sinus congestion, sinus pain, rhinorrhea and ear fullness Denies: ear drainage, sore throat, wheezing, shortness of breath, myalgia and fevers Treatment to date: Chlorcetin  Sick contacts: No   ROS:  Const: Denies fevers HEENT: As noted in HPI Lungs: No SOB  Past Medical History:  Diagnosis Date  . Allergy   . Anxiety   . Colitis   . Crohn's disease ([place])   . Depression   . Diverticulosis   . Hyperlipidemia   . Hypertension   . Mild intermittent asthma    rarely uses inhaler  . Nephrolithiasis    Family History  Problem Relation Age of Onset  . Dementia Father   . Alzheimer's disease Father   . Hyperlipidemia Unknown   . Hypertension Unknown   . Diabetes Unknown   . Allergic rhinitis Mother   . Food Allergy Mother        peanut  . Asthma Brother   . Crohn's disease Neg Hx   . Esophageal cancer Neg Hx   . Stomach cancer Neg Hx   . Rectal cancer Neg Hx   . Breast cancer Neg Hx   . Angioedema Neg Hx   . Eczema Neg Hx   . Immunodeficiency Neg Hx   . Urticaria Neg Hx     BP 110/80 (BP Location: Left Arm, Patient Position: Sitting, Cuff Size: Large)   Pulse 69   Temp 97.6 F (36.4 C) (Oral)   Ht 5' 4"  (1.626 m)   Wt 211 lb 4 oz (95.8 kg)   SpO2 97%   BMI 36.26 kg/m  General: Awake, alert, appears stated age HEENT: AT, [place], ears patent b/l and TM's neg, nares patent w/o discharge, pharynx pink and without exudates, MMM Neck: No masses or asymmetry Heart: RRR Lungs: CTAB, no accessory muscle use Psych: Age appropriate judgment and insight, normal mood and affect  Acute maxillary sinusitis, recurrence not specified  Pocket rx given.  Tylenol.  Continue to push fluids, practice good hand hygiene, cover mouth when coughing. F/u prn. If starting to experience fevers, shaking, or shortness of breath, seek  immediate care. Pt voiced understanding and agreement to the plan.  [place], DO 02/20/18 11:16 AM

## 2018-02-20 NOTE — Progress Notes (Signed)
Pre visit review using our clinic review tool, if applicable. No additional management support is needed unless otherwise documented below in the visit note. 

## 2018-03-01 ENCOUNTER — Encounter: Payer: Self-pay | Admitting: Family Medicine

## 2018-03-01 ENCOUNTER — Other Ambulatory Visit: Payer: Self-pay | Admitting: *Deleted

## 2018-03-01 DIAGNOSIS — I10 Essential (primary) hypertension: Secondary | ICD-10-CM

## 2018-03-01 DIAGNOSIS — E785 Hyperlipidemia, unspecified: Secondary | ICD-10-CM

## 2018-03-01 MED ORDER — EZETIMIBE 10 MG PO TABS: ORAL_TABLET | ORAL | 0 refills | Status: DC

## 2018-03-01 MED ORDER — OLMESARTAN MEDOXOMIL 20 MG PO TABS: 20.0000 mg | ORAL_TABLET | Freq: Every day | ORAL | 1 refills | Status: DC

## 2018-03-01 NOTE — Addendum Note (Signed)
Addended by: Kem Boroughs D on: 03/01/2018 04:57 PM   Modules accepted: Orders

## 2018-03-04 NOTE — Telephone Encounter (Signed)
I don't see anything from pharmacy D/c benicar---  Try norvasc 5 mg qd #30  2 refills

## 2018-03-06 MED ORDER — AMLODIPINE BESYLATE 5 MG PO TABS: 5.0000 mg | ORAL_TABLET | Freq: Every day | ORAL | 2 refills | Status: DC

## 2018-03-07 ENCOUNTER — Telehealth: Payer: Self-pay | Admitting: Family Medicine

## 2018-03-07 NOTE — Telephone Encounter (Signed)
Copied from [place] [id number]. Topic: Inquiry >> Mar 07, 2018  1:11 PM Margot Ables wrote: Reason for CRM: pt called in stating she completed abx 24 hours ago for sinus infection - she is having same sx of sinus headache, sinus congestion, sinus pain, rhinorrhea and ear fullness as OV 02/20/18 w/Dr. Nani Ravens - please advise  [facility], [place] - [address] [phone] (Phone) [phone] (Fax)

## 2018-03-07 NOTE — Telephone Encounter (Signed)
omnicef 300 mg 1 po bid x 10 days Ov if no better

## 2018-03-08 MED ORDER — CEFDINIR 300 MG PO CAPS: 300.0000 mg | ORAL_CAPSULE | Freq: Two times a day (BID) | ORAL | 0 refills | Status: DC

## 2018-03-08 NOTE — Telephone Encounter (Signed)
Patient notified and rx sent in

## 2018-04-11 ENCOUNTER — Ambulatory Visit: Payer: BLUE CROSS/BLUE SHIELD | Admitting: Internal Medicine

## 2018-04-11 ENCOUNTER — Encounter: Payer: Self-pay | Admitting: Internal Medicine

## 2018-04-11 ENCOUNTER — Other Ambulatory Visit (INDEPENDENT_AMBULATORY_CARE_PROVIDER_SITE_OTHER): Payer: BLUE CROSS/BLUE SHIELD

## 2018-04-11 VITALS — BP 134/88 | HR 76 | Ht 64.25 in | Wt 203.0 lb

## 2018-04-11 DIAGNOSIS — K219 Gastro-esophageal reflux disease without esophagitis: Secondary | ICD-10-CM | POA: Diagnosis not present

## 2018-04-11 DIAGNOSIS — Z79899 Other long term (current) drug therapy: Secondary | ICD-10-CM

## 2018-04-11 DIAGNOSIS — Z23 Encounter for immunization: Secondary | ICD-10-CM

## 2018-04-11 DIAGNOSIS — K5010 Crohn's disease of large intestine without complications: Secondary | ICD-10-CM

## 2018-04-11 LAB — COMPREHENSIVE METABOLIC PANEL
ALT: 18 U/L (ref 0–35)
AST: 17 U/L (ref 0–37)
Albumin: 4.6 g/dL (ref 3.5–5.2)
Alkaline Phosphatase: 51 U/L (ref 39–117)
BUN: 22 mg/dL (ref 6–23)
CO2: 31 mEq/L (ref 19–32)
Calcium: 9.5 mg/dL (ref 8.4–10.5)
Chloride: 100 mEq/L (ref 96–112)
Creatinine, Ser: 0.67 mg/dL (ref 0.40–1.20)
GFR: 98.41 mL/min (ref >60.00)
Glucose, Bld: 94 mg/dL (ref 70–99)
Potassium: 4 mEq/L (ref 3.5–5.1)
Sodium: 138 mEq/L (ref 135–145)
Total Bilirubin: 0.8 mg/dL (ref 0.2–1.2)
Total Protein: 7.5 g/dL (ref 6.0–8.3)

## 2018-04-11 LAB — VITAMIN B12: Vitamin B-12: 1368 pg/mL — ABNORMAL HIGH (ref 211–911)

## 2018-04-11 LAB — CBC WITH DIFFERENTIAL/PLATELET
Basophils Absolute: 0.1 10*3/uL (ref 0.0–0.1)
Basophils Relative: 0.7 % (ref 0.0–3.0)
Eosinophils Absolute: 0.2 10*3/uL (ref 0.0–0.7)
Eosinophils Relative: 2.1 % (ref 0.0–5.0)
HCT: 43.9 % (ref 36.0–46.0)
Hemoglobin: 14.8 g/dL (ref 12.0–15.0)
Lymphocytes Relative: 28.3 % (ref 12.0–46.0)
Lymphs Abs: 3 10*3/uL (ref 0.7–4.0)
MCHC: 33.8 g/dL (ref 30.0–36.0)
MCV: 87.6 fl (ref 78.0–100.0)
Monocytes Absolute: 1 10*3/uL (ref 0.1–1.0)
Monocytes Relative: 9 % (ref 3.0–12.0)
Neutro Abs: 6.4 10*3/uL (ref 1.4–7.7)
Neutrophils Relative %: 59.9 % (ref 43.0–77.0)
Platelets: 270 10*3/uL (ref 150.0–400.0)
RBC: 5.01 Mil/uL (ref 3.87–5.11)
RDW: 13.5 % (ref 11.5–15.5)
WBC: 10.7 10*3/uL — ABNORMAL HIGH (ref 4.0–10.5)

## 2018-04-11 LAB — HIGH SENSITIVITY CRP: CRP, High Sensitivity: 6.19 mg/L — ABNORMAL HIGH (ref 0.000–5.000)

## 2018-04-11 NOTE — Progress Notes (Signed)
Subjective:    Patient ID: Lindsey [name], female    DOB: 1966/05/09, 52 y.o.   MRN: [id number]  HPI Lindsey [name] is a 52 year old female with a history of Crohn's colitis (Humira initiated November 2018), history of iritis, hypertension, hyperlipidemia and kidney stones who is here for follow-up.  She was last seen in October 2018.  She is here alone today.  This is her first office visit since initiating Humira.  She did take azathioprine for a short period with no improvement.  She reports that she is feeling "a lot better" since starting her Humira.  She is tolerating this injection every 2 weeks without pain or injection site reaction.  She has had significantly less left-sided abdominal pain and less frequent diarrhea and loose stools.  She is still having 3-4 bowel movements per day but they are more formed though not yet solid.  They are also less explosive.  She has not seen blood in her stool for over a month.  She is using Lialda 2.4 g twice daily.  She had a viral illness about 1 month ago and held Humira about 4-5 days past when she was due.  During these 4-5 days she had increasing left-sided abdominal pain and diarrhea making her realize how beneficial  Humira has been.  She is also using essential oils and CBD oil.  She was seen by integrative health. She has been avoiding gluten and also dairy products which she is also found to be quite helpful.  Her heartburn has been less of an issue and she is not having nausea.  She is off of Zantac with no issues.  She started Twinrix vaccine and received 2 vaccines and is due her third and final vaccination now.  Initial vaccine was in October 2018  Review of Systems As per HPI, otherwise negative  Current Medications, Allergies, Past Medical History, Past Surgical History, Family History and Social History were reviewed in [employer] record.     Objective:   Physical Exam BP 134/88 (BP Location: Left  Arm, Patient Position: Sitting, Cuff Size: Normal)   Pulse 76   Ht 5' 4.25" (1.632 m)   Wt 203 lb (92.1 kg)   BMI 34.57 kg/m  Constitutional: Well-developed and well-nourished. No distress. HEENT: Normocephalic and atraumatic.   No scleral icterus. Neck: Neck supple. Trachea midline. Cardiovascular: Normal rate, regular rhythm and intact distal pulses. No M/R/G Pulmonary/chest: Effort normal and breath sounds normal. No wheezing, rales or rhonchi. Abdominal: Soft, nontender, nondistended. Bowel sounds active throughout. There are no masses palpable. No hepatosplenomegaly. Extremities: no clubbing, cyanosis, or edema Neurological: Alert and oriented to person place and time. Skin: Skin is warm and dry. Psychiatric: Normal mood and affect. Behavior is normal.      Assessment & Plan:  52 year old female with a history of Crohn's colitis (Humira initiated November 2018), history of iritis, hypertension, hyperlipidemia and kidney stones who is here for follow-up.   1.  Crohn's colitis, diagnosis December 2017 --she has done very well with the addition of Humira.  I think she can reduce Lialda to 2.4 g daily but should notify me if symptoms worsen when decreasing the dose of mesalamine.  For now continue Humira 40 mg every 14 days. --Check CBC, CMP, CRP and B12 --Complete Twinrix vaccination series today with third and final shot which is 6 months from when she started --Avoid NSAIDs when possible --We can consider colonoscopy to assess disease activity and response to Humira.  We discussed this briefly today and will discuss again at follow-up.  2.  GERD --no longer an issue.  She can still use Zantac per box instruction if and when needed  Follow-up in 3-4 months, sooner if needed 25 minutes spent with the patient today. Greater than 50% was spent in counseling and coordination of care with the patient

## 2018-04-11 NOTE — Patient Instructions (Addendum)
Continue Humira 40 grams every 2 weeks.  Your provider has requested that you go to the basement level for lab work before leaving today. Press "B" on the elevator. The lab is located at the first door on the left as you exit the elevator.  Please decrease your Lialda to 2.4 grams daily  Please follow up with Dr Hilarie Fredrickson in 3-4 months.  We have given you your final hepatitis A/B injection today.  If you are age 52 or older, your body mass index should be between 23-30. Your Body mass index is 34.57 kg/m. If this is out of the aforementioned range listed, please consider follow up with your Primary Care Provider.  If you are age 40 or younger, your body mass index should be between 19-25. Your Body mass index is 34.57 kg/m. If this is out of the aformentioned range listed, please consider follow up with your Primary Care Provider.

## 2018-04-19 ENCOUNTER — Ambulatory Visit: Payer: BLUE CROSS/BLUE SHIELD

## 2018-04-30 ENCOUNTER — Other Ambulatory Visit: Payer: Self-pay

## 2018-04-30 MED ORDER — MONTELUKAST SODIUM 10 MG PO TABS: 10.0000 mg | ORAL_TABLET | Freq: Every day | ORAL | 0 refills | Status: DC

## 2018-04-30 NOTE — Telephone Encounter (Signed)
RF on Singulair given x 1 with no refills at [facility]. Pt needs an OV

## 2018-05-06 ENCOUNTER — Telehealth: Payer: Self-pay | Admitting: Family Medicine

## 2018-05-06 NOTE — Telephone Encounter (Signed)
Copied from [place] [phone]. Topic: Quick Communication - Rx Refill/Question >> May 06, 2018 12:04 PM Wynetta Emery, [place] C wrote: Medication: FLUoxetine (PROZAC) 20 MG capsule   Has the patient contacted their pharmacy? Yes, pt says that she sent several request via pharmacy  (Agent: If no, request that the patient contact the pharmacy for the refill.)  Preferred Pharmacy (with phone number or street name): [facility], [place] - [address] [phone] (Phone) [phone] (Fax)     Agent: Please be advised that RX refills may take up to 3 business days. We ask that you follow-up with your pharmacy.

## 2018-05-07 ENCOUNTER — Ambulatory Visit: Payer: BLUE CROSS/BLUE SHIELD | Admitting: Podiatry

## 2018-05-07 ENCOUNTER — Encounter: Payer: Self-pay | Admitting: Podiatry

## 2018-05-07 ENCOUNTER — Other Ambulatory Visit: Payer: Self-pay | Admitting: *Deleted

## 2018-05-07 VITALS — BP 138/87 | HR 75 | Ht 64.0 in | Wt 200.0 lb

## 2018-05-07 DIAGNOSIS — M722 Plantar fascial fibromatosis: Secondary | ICD-10-CM

## 2018-05-07 DIAGNOSIS — M21969 Unspecified acquired deformity of unspecified lower leg: Secondary | ICD-10-CM

## 2018-05-07 DIAGNOSIS — M216X9 Other acquired deformities of unspecified foot: Secondary | ICD-10-CM

## 2018-05-07 DIAGNOSIS — F411 Generalized anxiety disorder: Secondary | ICD-10-CM

## 2018-05-07 MED ORDER — FLUOXETINE HCL 20 MG PO CAPS: ORAL_CAPSULE | ORAL | 0 refills | Status: DC

## 2018-05-07 NOTE — Progress Notes (Signed)
SUBJECTIVE: 52 y.o. year old female presents complaining of pain at instep and near the medial heel duration of off and on for over a year. On feet at work 4-5 hours a day. Does ride bike and stationary bike. Has had custom orthotics 15 years ago for the same problem. The old pair show worn out rough edges at the borders of top cover. Patient is referred by Dr. Carollee Herter.   Review of Systems  Constitutional: Negative.   HENT: Negative.   Eyes: Negative.   Respiratory: Negative.   Cardiovascular: Negative.   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  Genitourinary: Negative.   Musculoskeletal: Negative.   Skin: Negative.      OBJECTIVE: DERMATOLOGIC EXAMINATION: Normal findings. VASCULAR EXAMINATION OF LOWER LIMBS: All pedal pulses are palpable with normal pulsation.  Capillary Filling times within 3 seconds in all digits.  No edema or erythema noted. Temperature gradient from tibial crest to dorsum of foot is within normal bilateral.  NEUROLOGIC EXAMINATION OF THE LOWER LIMBS: Achilles DTR is present and within normal. Monofilament (Semmes-Weinstein 10-gm) sensory testing positive 6 out of 6, bilateral. Vibratory sensations(128Hz  turning fork) intact at medial and lateral forefoot bilateral.  Sharp and Dull discriminatory sensations at the plantar ball of hallux is intact bilateral.   MUSCULOSKELETAL EXAMINATION: Positive for hypermobile first ray bilateral.  RADIOGRAPHIC STUDIES:  AP View:  Positive for Accessory navicular bone over at medial aspect of the Navicular bone bilateral. Rectus foot with increased lateral deviation angle of CCJ bilateral. Lateral view:  Mild first ray elevatus bilateral.   ASSESSMENT: Plantar fasciitis right. 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 STJ hyperpronation bilateral.  PLAN:s Reviewed findings and available treatment  options. Metatarsal binder placed on right foot with instruction. Both feet casted for Orthotics.

## 2018-05-07 NOTE — Patient Instructions (Addendum)
Seen for pain in right foot. Noted of the first metatarsal bone being weak and allow excess motion during weight bearing bilateral. X-ray show positive evidence of Accessory navicular bone, which may have interfered the Posterior tibialis tendon function and allowed excess motion of the first metatarsal bone on both feet. Reviewed findings. Metatarsal binder dispensed x 1 (medicum). Both feet casted for Orthotics.

## 2018-05-07 NOTE — Telephone Encounter (Signed)
Rx refilled per protocol- LOV: 10/23/17 to continue Rx

## 2018-05-13 ENCOUNTER — Telehealth: Payer: Self-pay | Admitting: *Deleted

## 2018-05-13 NOTE — Telephone Encounter (Signed)
For CPT code L3020. If billed with office visit for specialist and patient pays specialist copay of $40 then plan pays 100% of allowed amount

## 2018-05-14 NOTE — Telephone Encounter (Signed)
Out of pocket max is 6600. Patient has met [phone] of out of pocket max

## 2018-05-17 NOTE — Telephone Encounter (Signed)
Ref # for call to insurance is [id number]

## 2018-05-17 NOTE — Telephone Encounter (Signed)
Patient notified

## 2018-05-30 ENCOUNTER — Other Ambulatory Visit: Payer: Self-pay

## 2018-06-03 ENCOUNTER — Other Ambulatory Visit: Payer: Self-pay | Admitting: Family Medicine

## 2018-06-03 ENCOUNTER — Telehealth: Payer: Self-pay | Admitting: Family Medicine

## 2018-06-03 DIAGNOSIS — E785 Hyperlipidemia, unspecified: Secondary | ICD-10-CM

## 2018-06-03 NOTE — Telephone Encounter (Signed)
Copied from [place] [phone]. Topic: Quick Communication - Rx Refill/Question >> Jun 03, 2018  5:09 PM Selinda Flavin B, NT wrote: Medication: ezetimibe (ZETIA) 10 MG tablet amLODipine (NORVASC) 5 MG tablet  Has the patient contacted their pharmacy? Yes.   (Agent: If no, request that the patient contact the pharmacy for the refill.) (Agent: If yes, when and what did the pharmacy advise?)  Preferred Pharmacy (with phone number or street name): [facility], [place] - [address]: Please be advised that RX refills may take up to 3 business days. We ask that you follow-up with your pharmacy.

## 2018-06-04 ENCOUNTER — Other Ambulatory Visit: Payer: Self-pay | Admitting: Family

## 2018-06-04 ENCOUNTER — Telehealth: Payer: Self-pay

## 2018-06-04 ENCOUNTER — Other Ambulatory Visit: Payer: Self-pay | Admitting: Allergy

## 2018-06-04 MED ORDER — MONTELUKAST SODIUM 10 MG PO TABS: 10.0000 mg | ORAL_TABLET | Freq: Every day | ORAL | 0 refills | Status: AC

## 2018-06-04 MED ORDER — EZETIMIBE 10 MG PO TABS: ORAL_TABLET | ORAL | 0 refills | Status: DC

## 2018-06-04 MED ORDER — LEVOCETIRIZINE DIHYDROCHLORIDE 5 MG PO TABS: 5.0000 mg | ORAL_TABLET | Freq: Every evening | ORAL | 0 refills | Status: AC

## 2018-06-04 NOTE — Telephone Encounter (Signed)
Amlodipine refill sent to pharmacy, pt last seen 09/2017 and has no future appt scheduled. Please advise when pt should return for follow up?

## 2018-06-04 NOTE — Telephone Encounter (Signed)
She should have had an appointmtne in April.

## 2018-06-04 NOTE — Telephone Encounter (Signed)
Pt notified that orthotics are ready for pickup.

## 2018-06-04 NOTE — Telephone Encounter (Signed)
90 day supply of zetia sent to pharmacy. Pt last seen by PCP 09/2017 and has no future appts scheduled.  Please advise when pt should follow up in the office?

## 2018-07-15 ENCOUNTER — Telehealth: Payer: Self-pay | Admitting: *Deleted

## 2018-07-15 NOTE — Telephone Encounter (Signed)
Patient called last week and stated that she received a bill for the whole amount of orthotics $398 when benefits states otherwise.. As a courtesy called the patient's insurance and verified that benefits documented were correct. The claim was processed incorrectly they will reprocess claim correctly. They processed it with it with regular PCP copay and not a specialist copay. It can take up to 30 days to fix but rep states usually it takes no more than a week.Reference # for the call [id number]. Tried to leave a vm for patient to return call but vm kept cutting off because it could not pick up my voice.

## 2018-07-16 NOTE — Telephone Encounter (Signed)
Patient notified

## 2018-07-30 ENCOUNTER — Ambulatory Visit: Payer: BLUE CROSS/BLUE SHIELD | Admitting: Podiatry

## 2018-08-02 ENCOUNTER — Other Ambulatory Visit: Payer: Self-pay | Admitting: *Deleted

## 2018-08-02 DIAGNOSIS — F411 Generalized anxiety disorder: Secondary | ICD-10-CM

## 2018-08-02 MED ORDER — FLUOXETINE HCL 20 MG PO CAPS: ORAL_CAPSULE | ORAL | 0 refills | Status: DC

## 2018-08-29 ENCOUNTER — Other Ambulatory Visit: Payer: Self-pay | Admitting: Family

## 2018-08-29 DIAGNOSIS — E785 Hyperlipidemia, unspecified: Secondary | ICD-10-CM

## 2018-09-06 ENCOUNTER — Encounter: Payer: BLUE CROSS/BLUE SHIELD | Admitting: Family Medicine

## 2018-09-10 ENCOUNTER — Ambulatory Visit (INDEPENDENT_AMBULATORY_CARE_PROVIDER_SITE_OTHER): Payer: BLUE CROSS/BLUE SHIELD | Admitting: Family Medicine

## 2018-09-10 ENCOUNTER — Encounter: Payer: Self-pay | Admitting: Family Medicine

## 2018-09-10 VITALS — BP 112/69 | HR 68 | Temp 98.1°F | Resp 16 | Ht 64.5 in | Wt 201.6 lb

## 2018-09-10 DIAGNOSIS — F411 Generalized anxiety disorder: Secondary | ICD-10-CM

## 2018-09-10 DIAGNOSIS — I10 Essential (primary) hypertension: Secondary | ICD-10-CM

## 2018-09-10 DIAGNOSIS — Z23 Encounter for immunization: Secondary | ICD-10-CM

## 2018-09-10 DIAGNOSIS — Z0000 Encounter for general adult medical examination without abnormal findings: Secondary | ICD-10-CM

## 2018-09-10 DIAGNOSIS — E785 Hyperlipidemia, unspecified: Secondary | ICD-10-CM

## 2018-09-10 MED ORDER — EZETIMIBE 10 MG PO TABS: ORAL_TABLET | ORAL | 1 refills | Status: DC

## 2018-09-10 MED ORDER — FLUOXETINE HCL 20 MG PO CAPS: ORAL_CAPSULE | ORAL | 3 refills | Status: DC

## 2018-09-10 NOTE — Progress Notes (Signed)
Subjective:     Lindsey [name] is a 52 y.o. female and is here for a comprehensive physical exam. The patient reports no problems. Pt needs refills on meds as well .     Social History   Socioeconomic History  . Marital status: Married    Spouse name: Not on file  . Number of children: Not on file  . Years of education: Not on file  . Highest education level: Not on file  Occupational History  . Not on file  Social Needs  . Financial resource strain: Not on file  . Food insecurity:    Worry: Not on file    Inability: Not on file  . Transportation needs:    Medical: Not on file    Non-medical: Not on file  Tobacco Use  . Smoking status: Former Smoker    Packs/day: 1.00    Years: 10.00    Pack years: 10.00    Types: Cigarettes    Last attempt to quit: 12/26/1995    Years since quitting: 22.7  . Smokeless tobacco: Never Used  Substance and Sexual Activity  . Alcohol use: Yes    Comment: occasional wine   . Drug use: No  . Sexual activity: Yes  Lifestyle  . Physical activity:    Days per week: Not on file    Minutes per session: Not on file  . Stress: Not on file  Relationships  . Social connections:    Talks on phone: Not on file    Gets together: Not on file    Attends religious service: Not on file    Active member of club or organization: Not on file    Attends meetings of clubs or organizations: Not on file    Relationship status: Not on file  . Intimate partner violence:    Fear of current or ex partner: Not on file    Emotionally abused: Not on file    Physically abused: Not on file    Forced sexual activity: Not on file  Other Topics Concern  . Not on file  Social History Narrative   Occupation: Babysit   Married   Regular exercise- yes   Health Maintenance  Topic Date Due  . HIV Screening  09/10/2024 (Originally [birth year]/1981)  . MAMMOGRAM  10/08/2018  . PAP SMEAR  05/26/2019  . TETANUS/TDAP  05/14/2022  . COLONOSCOPY  12/12/2026  . INFLUENZA  VACCINE  Completed    The following portions of the patient's history were reviewed and updated as appropriate:  She  has a past medical history of Allergy, Anxiety, Colitis, Crohn's disease ([place]), Depression, Diverticulosis, Hyperlipidemia, Hypertension, Mild intermittent asthma, and Nephrolithiasis. She does not have any pertinent problems on file. She  has a past surgical history that includes LEEP; Lithotripsy; Wisdom tooth extraction; and Combined hysteroscopy diagnostic / D&C. Her family history includes Allergic rhinitis in her mother; Alzheimer's disease in her father; Asthma in her brother; Dementia in her father; Diabetes in her unknown relative; Food Allergy in her mother; Hyperlipidemia in her unknown relative; Hypertension in her unknown relative. She  reports that she quit smoking about 22 years ago. Her smoking use included cigarettes. She has a 10.00 pack-year smoking history. She has never used smokeless tobacco. She reports that she drinks alcohol. She reports that she does not use drugs. She has a current medication list which includes the following prescription(s): adalimumab, albuterol, alprazolam, AMBULATORY NON FORMULARY MEDICATION, amlodipine, ashwagandha, desloratadine, ergocalciferol, ezetimibe, fluoxetine, indapamide, levocetirizine, mesalamine, montelukast,  omega-3 acid ethyl esters, potassium citrate, ranitidine, and vitamin b-12, and the following Facility-Administered Medications: sodium chloride. Current Outpatient Medications on File Prior to Visit  Medication Sig Dispense Refill  . Adalimumab (HUMIRA PEN) 40 MG/0.4ML PNKT Inject 40 mg every 14 (fourteen) days into the skin. 2 each 11  . albuterol (PROVENTIL HFA;VENTOLIN HFA) 108 (90 Base) MCG/ACT inhaler Inhale 2 puffs into the lungs every 4 (four) hours as needed for wheezing or shortness of breath. 1 Inhaler 2  . ALPRAZolam (XANAX) 0.5 MG tablet Take 1 tablet (0.5 mg total) by mouth as needed for anxiety. 10 tablet 0   . AMBULATORY NON FORMULARY MEDICATION CBD Oil 1 dose twice a day    . amLODipine (NORVASC) 5 MG tablet TAKE ONE (1) TABLET BY MOUTH EVERY DAY 30 tablet 4  . ASHWAGANDHA PO Take 1 tablet by mouth daily.    Marland Kitchen desloratadine (CLARINEX) 5 MG tablet Take 1 tablet by mouth daily.  4  . ergocalciferol (VITAMIN D2) 50000 units capsule Take 50,000 Units by mouth once a week.    . indapamide (LOZOL) 1.25 MG tablet   9  . levocetirizine (XYZAL) 5 MG tablet Take 1 tablet (5 mg total) by mouth every evening. 30 tablet 0  . mesalamine (LIALDA) 1.2 g EC tablet TAKE TWO TABLETS BY MOUTH TWICE DAILY 120 tablet 2  . montelukast (SINGULAIR) 10 MG tablet Take 1 tablet (10 mg total) by mouth at bedtime. 30 tablet 0  . omega-3 acid ethyl esters (LOVAZA) 1 g capsule Take 2 capsules (2 g total) by mouth 2 (two) times daily. 360 capsule 1  . potassium citrate (UROCIT-K) 10 MEQ (1080 MG) SR tablet Take 2 tablets (20 mEq total) by mouth daily. 60 tablet 5  . ranitidine (ZANTAC) 150 MG tablet TAKE ONE TABLET ONE TO TWO TIMES A DAY AS DIRECTED. 60 tablet 4  . vitamin B-12 (CYANOCOBALAMIN) 1000 MCG tablet Take 1,000 mcg by mouth daily.     Current Facility-Administered Medications on File Prior to Visit  Medication Dose Route Frequency Provider Last Rate Last Dose  . 0.9 %  sodium chloride infusion  500 mL Intravenous Continuous Pyrtle, Lajuan Lines, MD       She is allergic to azithromycin and nitrofurantoin..  Review of Systems Review of Systems  Constitutional: Negative for activity change, appetite change and fatigue.  HENT: Negative for hearing loss, congestion, tinnitus and ear discharge.  dentist q65mEyes: Negative for visual disturbance (see optho q1y -- vision corrected to 20/20 with glasses).  Respiratory: Negative for cough, chest tightness and shortness of breath.   Cardiovascular: Negative for chest pain, palpitations and leg swelling.  Gastrointestinal: Negative for abdominal pain, diarrhea, constipation and  abdominal distention.  Genitourinary: Negative for urgency, frequency, decreased urine volume and difficulty urinating.  Musculoskeletal: Negative for back pain, arthralgias and gait problem.  Skin: Negative for color change, pallor and rash.  Neurological: Negative for dizziness, light-headedness, numbness and headaches.  Hematological: Negative for adenopathy. Does not bruise/bleed easily.  Psychiatric/Behavioral: Negative for suicidal ideas, confusion, sleep disturbance, self-injury, dysphoric mood, decreased concentration and agitation.       Objective:    BP 112/69 (BP Location: Right Arm, Cuff Size: Large)   Pulse 68   Temp 98.1 F (36.7 C) (Oral)   Resp 16   Ht 5' 4.5" (1.638 m)   Wt 201 lb 9.6 oz (91.4 kg)   SpO2 97%   BMI 34.07 kg/m  General appearance: alert, cooperative, appears stated age  and no distress Head: Normocephalic, without obvious abnormality, atraumatic Eyes: conjunctivae/corneas clear. PERRL, EOM's intact. Fundi benign. Ears: normal TM's and external ear canals both ears Nose: Nares normal. Septum midline. Mucosa normal. No drainage or sinus tenderness. Throat: lips, mucosa, and tongue normal; teeth and gums normal Neck: no adenopathy, no carotid bruit, no JVD, supple, symmetrical, trachea midline and thyroid not enlarged, symmetric, no tenderness/mass/nodules Back: symmetric, no curvature. ROM normal. No CVA tenderness. Lungs: clear to auscultation bilaterally Breasts: gyn Heart: regular rate and rhythm, S1, S2 normal, no murmur, click, rub or gallop Abdomen: soft, non-tender; bowel sounds normal; no masses,  no organomegaly Pelvic: deferred--gyn Extremities: extremities normal, atraumatic, no cyanosis or edema Pulses: 2+ and symmetric Skin: Skin color, texture, turgor normal. No rashes or lesions Lymph nodes: Cervical, supraclavicular, and axillary nodes normal. Neurologic: Alert and oriented X 3, normal strength and tone. Normal symmetric reflexes.  Normal coordination and gait    Assessment:    Healthy female exam.     Plan:    ghm utd Check labs  See After Visit Summary for Counseling Recommendations    1. Preventative health care See above - TSH - Lipid panel - CBC with Differential/Platelet - Comprehensive metabolic panel - CBC with Differential/Platelet; Future - Lipid panel; Future - TSH; Future - Comprehensive metabolic panel; Future  2. Influenza vaccine administered   - Flu Vaccine QUAD 36+ mos IM (Fluarix & Fluzone Quad PF  3. Essential hypertension Well controlled, no changes to meds. Encouraged heart healthy diet such as the DASH diet and exercise as tolerated.  - TSH - Lipid panel - CBC with Differential/Platelet - Comprehensive metabolic panel - CBC with Differential/Platelet; Future - Lipid panel; Future - TSH; Future  4. Hyperlipidemia, unspecified hyperlipidemia type Encouraged heart healthy diet, increase exercise, avoid trans fats, consider a krill oil cap daily - ezetimibe (ZETIA) 10 MG tablet; TAKE ONE (1) TABLET BY MOUTH EVERY DAY.  Dispense: 90 tablet; Refill: 1 - TSH - Lipid panel - CBC with Differential/Platelet - Comprehensive metabolic panel - CBC with Differential/Platelet; Future - Lipid panel; Future - TSH; Future - Comprehensive metabolic panel; Future  5. Generalized anxiety disorder Stable con't meds  - FLUoxetine (PROZAC) 20 MG capsule; TAKE ONE (1) CAPSULE EACH DAY  Dispense: 90 capsule; Refill: 3

## 2018-09-10 NOTE — Patient Instructions (Signed)
Preventive Care 40-64 Years, Female Preventive care refers to lifestyle choices and visits with your health care provider that can promote health and wellness. What does preventive care include?  A yearly physical exam. This is also called an annual well check.  Dental exams once or twice a year.  Routine eye exams. Ask your health care provider how often you should have your eyes checked.  Personal lifestyle choices, including: ? Daily care of your teeth and gums. ? Regular physical activity. ? Eating a healthy diet. ? Avoiding tobacco and drug use. ? Limiting alcohol use. ? Practicing safe sex. ? Taking low-dose aspirin daily starting at age 58. ? Taking vitamin and mineral supplements as recommended by your health care provider. What happens during an annual well check? The services and screenings done by your health care provider during your annual well check will depend on your age, overall health, lifestyle risk factors, and family history of disease. Counseling Your health care provider may ask you questions about your:  Alcohol use.  Tobacco use.  Drug use.  Emotional well-being.  Home and relationship well-being.  Sexual activity.  Eating habits.  Work and work Statistician.  Method of birth control.  Menstrual cycle.  Pregnancy history.  Screening You may have the following tests or measurements:  Height, weight, and BMI.  Blood pressure.  Lipid and cholesterol levels. These may be checked every 5 years, or more frequently if you are over 81 years old.  Skin check.  Lung cancer screening. You may have this screening every year starting at age 78 if you have a 30-pack-year history of smoking and currently smoke or have quit within the past 15 years.  Fecal occult blood test (FOBT) of the stool. You may have this test every year starting at age 65.  Flexible sigmoidoscopy or colonoscopy. You may have a sigmoidoscopy every 5 years or a colonoscopy  every 10 years starting at age 30.  Hepatitis C blood test.  Hepatitis B blood test.  Sexually transmitted disease (STD) testing.  Diabetes screening. This is done by checking your blood sugar (glucose) after you have not eaten for a while (fasting). You may have this done every 1-3 years.  Mammogram. This may be done every 1-2 years. Talk to your health care provider about when you should start having regular mammograms. This may depend on whether you have a family history of breast cancer.  BRCA-related cancer screening. This may be done if you have a family history of breast, ovarian, tubal, or peritoneal cancers.  Pelvic exam and Pap test. This may be done every 3 years starting at age 80. Starting at age 36, this may be done every 5 years if you have a Pap test in combination with an HPV test.  Bone density scan. This is done to screen for osteoporosis. You may have this scan if you are at high risk for osteoporosis.  Discuss your test results, treatment options, and if necessary, the need for more tests with your health care provider. Vaccines Your health care provider may recommend certain vaccines, such as:  Influenza vaccine. This is recommended every year.  Tetanus, diphtheria, and acellular pertussis (Tdap, Td) vaccine. You may need a Td booster every 10 years.  Varicella vaccine. You may need this if you have not been vaccinated.  Zoster vaccine. You may need this after age 5.  Measles, mumps, and rubella (MMR) vaccine. You may need at least one dose of MMR if you were born in  1957 or later. You may also need a second dose.  Pneumococcal 13-valent conjugate (PCV13) vaccine. You may need this if you have certain conditions and were not previously vaccinated.  Pneumococcal polysaccharide (PPSV23) vaccine. You may need one or two doses if you smoke cigarettes or if you have certain conditions.  Meningococcal vaccine. You may need this if you have certain  conditions.  Hepatitis A vaccine. You may need this if you have certain conditions or if you travel or work in places where you may be exposed to hepatitis A.  Hepatitis B vaccine. You may need this if you have certain conditions or if you travel or work in places where you may be exposed to hepatitis B.  Haemophilus influenzae type b (Hib) vaccine. You may need this if you have certain conditions.  Talk to your health care provider about which screenings and vaccines you need and how often you need them. This information is not intended to replace advice given to you by your health care provider. Make sure you discuss any questions you have with your health care provider. Document Released: 01/07/2016 Document Revised: 08/30/2016 Document Reviewed: 10/12/2015 Elsevier Interactive Patient Education  2018 Elsevier Inc.  

## 2018-09-11 ENCOUNTER — Other Ambulatory Visit (INDEPENDENT_AMBULATORY_CARE_PROVIDER_SITE_OTHER): Payer: BLUE CROSS/BLUE SHIELD

## 2018-09-11 DIAGNOSIS — Z0000 Encounter for general adult medical examination without abnormal findings: Secondary | ICD-10-CM

## 2018-09-11 DIAGNOSIS — E785 Hyperlipidemia, unspecified: Secondary | ICD-10-CM | POA: Diagnosis not present

## 2018-09-11 DIAGNOSIS — I10 Essential (primary) hypertension: Secondary | ICD-10-CM

## 2018-09-11 LAB — CBC WITH DIFFERENTIAL/PLATELET
Basophils Absolute: 0.1 10*3/uL (ref 0.0–0.1)
Basophils Relative: 0.9 % (ref 0.0–3.0)
Eosinophils Absolute: 0.3 10*3/uL (ref 0.0–0.7)
Eosinophils Relative: 5 % (ref 0.0–5.0)
HCT: 42.6 % (ref 36.0–46.0)
Hemoglobin: 14.2 g/dL (ref 12.0–15.0)
Lymphocytes Relative: 37.1 % (ref 12.0–46.0)
Lymphs Abs: 2.5 10*3/uL (ref 0.7–4.0)
MCHC: 33.4 g/dL (ref 30.0–36.0)
MCV: 88.9 fl (ref 78.0–100.0)
Monocytes Absolute: 0.7 10*3/uL (ref 0.1–1.0)
Monocytes Relative: 10.8 % (ref 3.0–12.0)
Neutro Abs: 3.1 10*3/uL (ref 1.4–7.7)
Neutrophils Relative %: 46.2 % (ref 43.0–77.0)
Platelets: 246 10*3/uL (ref 150.0–400.0)
RBC: 4.79 Mil/uL (ref 3.87–5.11)
RDW: 14.2 % (ref 11.5–15.5)
WBC: 6.7 10*3/uL (ref 4.0–10.5)

## 2018-09-11 LAB — COMPREHENSIVE METABOLIC PANEL
ALT: 20 U/L (ref 0–35)
AST: 19 U/L (ref 0–37)
Albumin: 4.4 g/dL (ref 3.5–5.2)
Alkaline Phosphatase: 68 U/L (ref 39–117)
BUN: 22 mg/dL (ref 6–23)
CO2: 29 mEq/L (ref 19–32)
Calcium: 9.4 mg/dL (ref 8.4–10.5)
Chloride: 102 mEq/L (ref 96–112)
Creatinine, Ser: 0.71 mg/dL (ref 0.40–1.20)
GFR: 91.89 mL/min (ref >60.00)
Glucose, Bld: 96 mg/dL (ref 70–99)
Potassium: 3.9 mEq/L (ref 3.5–5.1)
Sodium: 138 mEq/L (ref 135–145)
Total Bilirubin: 0.8 mg/dL (ref 0.2–1.2)
Total Protein: 7.3 g/dL (ref 6.0–8.3)

## 2018-09-11 LAB — TSH: TSH: 3.59 u[IU]/mL (ref 0.35–4.50)

## 2018-09-11 LAB — LIPID PANEL
Cholesterol: 204 mg/dL — ABNORMAL HIGH (ref 0–200)
HDL: 61.6 mg/dL (ref >39.00)
LDL Cholesterol: 128 mg/dL — ABNORMAL HIGH (ref 0–99)
NonHDL: 142.3
Total CHOL/HDL Ratio: 3
Triglycerides: 72 mg/dL (ref 0.0–149.0)
VLDL: 14.4 mg/dL (ref 0.0–40.0)

## 2018-09-12 ENCOUNTER — Telehealth: Payer: Self-pay | Admitting: *Deleted

## 2018-09-12 NOTE — Telephone Encounter (Signed)
Left message on machine that she can get shingles vaccine per GI.

## 2018-09-12 NOTE — Telephone Encounter (Signed)
-----   Message from Ann Held, DO sent at 09/12/2018  8:26 AM EDT ----- Spoke with GI----- the patient can get shingrix

## 2018-09-14 ENCOUNTER — Encounter: Payer: Self-pay | Admitting: Family Medicine

## 2018-09-17 NOTE — Addendum Note (Signed)
Addended byDamita Dunnings D on: 09/17/2018 11:52 AM   Modules accepted: Orders

## 2018-09-17 NOTE — Telephone Encounter (Signed)
Viewed by [name] on 09/16/2018 9:05 PM

## 2018-09-26 ENCOUNTER — Other Ambulatory Visit: Payer: Self-pay | Admitting: Obstetrics & Gynecology

## 2018-09-26 DIAGNOSIS — N6320 Unspecified lump in the left breast, unspecified quadrant: Secondary | ICD-10-CM

## 2018-10-11 ENCOUNTER — Ambulatory Visit: Payer: BLUE CROSS/BLUE SHIELD

## 2018-10-11 ENCOUNTER — Ambulatory Visit
Admission: RE | Admit: 2018-10-11 | Discharge: 2018-10-11 | Disposition: A | Discharge status: Home / Self Care | Payer: BLUE CROSS/BLUE SHIELD | Source: Ambulatory Visit | Attending: Obstetrics & Gynecology | Admitting: Obstetrics & Gynecology

## 2018-10-11 DIAGNOSIS — N6320 Unspecified lump in the left breast, unspecified quadrant: Secondary | ICD-10-CM

## 2018-10-29 ENCOUNTER — Other Ambulatory Visit: Payer: Self-pay | Admitting: Internal Medicine

## 2018-10-29 ENCOUNTER — Other Ambulatory Visit: Payer: Self-pay

## 2018-10-29 DIAGNOSIS — K5090 Crohn's disease, unspecified, without complications: Secondary | ICD-10-CM

## 2018-10-30 ENCOUNTER — Other Ambulatory Visit: Payer: Self-pay | Admitting: Family Medicine

## 2018-11-27 ENCOUNTER — Ambulatory Visit: Payer: BLUE CROSS/BLUE SHIELD | Admitting: Internal Medicine

## 2018-11-27 ENCOUNTER — Encounter: Payer: Self-pay | Admitting: Internal Medicine

## 2018-11-27 VITALS — BP 130/86 | HR 76 | Ht 64.0 in | Wt 206.1 lb

## 2018-11-27 DIAGNOSIS — K5010 Crohn's disease of large intestine without complications: Secondary | ICD-10-CM

## 2018-11-27 DIAGNOSIS — Z23 Encounter for immunization: Secondary | ICD-10-CM | POA: Diagnosis not present

## 2018-11-27 DIAGNOSIS — Z79899 Other long term (current) drug therapy: Secondary | ICD-10-CM | POA: Diagnosis not present

## 2018-11-27 MED ORDER — ZOSTER VAC RECOMB ADJUVANTED 50 MCG/0.5ML IM SUSR: INTRAMUSCULAR | 1 refills | Status: DC

## 2018-11-27 NOTE — Patient Instructions (Signed)
You have been scheduled for a colonoscopy. Please follow written instructions given to you at your visit today.  Please pick up your prep supplies at the pharmacy within the next 1-3 days. If you use inhalers (even only as needed), please bring them with you on the day of your procedure. Your physician has requested that you go to www.startemmi.com and enter the access code given to you at your visit today. This web site gives a general overview about your procedure. However, you should still follow specific instructions given to you by our office regarding your preparation for the procedure.  Your provider has requested that you go to the basement level for lab work on 12/06/18. Press "B" on the elevator. The lab is located at the first door on the left as you exit the elevator.  Continue Humira.  Continue Lialda 2.4 grams daily.  We have given you a prescription for Shingrix to take to your pharmacy. Please make sure to get the 2 injection series.  If you are age 28 or older, your body mass index should be between 23-30. Your Body mass index is 35.38 kg/m. If this is out of the aforementioned range listed, please consider follow up with your Primary Care Provider.  If you are age 18 or younger, your body mass index should be between 19-25. Your Body mass index is 35.38 kg/m. If this is out of the aformentioned range listed, please consider follow up with your Primary Care Provider.

## 2018-11-27 NOTE — Progress Notes (Signed)
Subjective:    Patient ID: Lindsey [name], female    DOB: 1966-02-02, 52 y.o.   MRN: [id number]  HPI Lindsey [name] is a 52 year old female with a history of Crohn's colitis on Humira since November 2018, history of iritis, hypertension, hyperlipidemia and kidney stones who is here for follow-up.  She was last seen on 04/11/2018.  She is here alone today.  She reports that she has been doing well.  She will occasionally have "bad days" where she has her prior left upper quadrant discomfort though not as severe as before Humira.  This can occur 1 or 2 days/week.  On these days her bowel movements are slightly more frequent.  She will very rarely see blood with wiping or with stool.  All much less frequent than prior to Humira initiation.  No fevers or chills.  No ocular complaint.  She has been dealing with hives and his seeing Dr. Remus Blake with immunology.  She was discovered to have multiple environmental allergies and is receiving immunotherapy injections.  GERD symptoms have been better and she has not needed Zantac.  Nausea has also improved.  No dysphagia or odynophagia.  She is using Humira 40 mg every 14 days.  She has been consistent with dosing though at her last dose she was 1 week late because she was traveling in [place] and did not have her medication with her.  She also takes Lialda 2.4 g/day.  She is using CBD oil on her skin which she feels is help with her Crohn's.  She is also recently started seeing integrative medicine.  She completed the Twinrix vaccine series and also received influenza vaccine already.  Review of Systems As per HPI, otherwise negative  Current Medications, Allergies, Past Medical History, Past Surgical History, Family History and Social History were reviewed in [employer] record.     Objective:   Physical Exam BP 130/86   Pulse 76   Ht 5' 4"  (1.626 m)   Wt 206 lb 2 oz (93.5 kg)   BMI 35.38 kg/m  Constitutional:  Well-developed and well-nourished. No distress. HEENT: Normocephalic and atraumatic.  Conjunctivae are normal.  No scleral icterus. Neck: Neck supple. Trachea midline. Cardiovascular: Normal rate, regular rhythm and intact distal pulses. No M/R/G Pulmonary/chest: Effort normal and breath sounds normal. No wheezing, rales or rhonchi. Abdominal: Soft, nontender, nondistended. Bowel sounds active throughout. There are no masses palpable. No hepatosplenomegaly. Extremities: no clubbing, cyanosis, or edema Neurological: Alert and oriented to person place and time. Skin: Skin is warm and dry. Psychiatric: Normal mood and affect. Behavior is normal.  CBC    Component Value Date/Time   WBC 6.7 09/11/2018 0803   RBC 4.79 09/11/2018 0803   HGB 14.2 09/11/2018 0803   HCT 42.6 09/11/2018 0803   PLT 246.0 09/11/2018 0803   MCV 88.9 09/11/2018 0803   MCH 27.8 01/05/2015 1235   MCHC 33.4 09/11/2018 0803   RDW 14.2 09/11/2018 0803   LYMPHSABS 2.5 09/11/2018 0803   MONOABS 0.7 09/11/2018 0803   EOSABS 0.3 09/11/2018 0803   BASOSABS 0.1 09/11/2018 0803   CMP     Component Value Date/Time   NA 138 09/11/2018 0803   K 3.9 09/11/2018 0803   CL 102 09/11/2018 0803   CO2 29 09/11/2018 0803   GLUCOSE 96 09/11/2018 0803   GLUCOSE 94 12/31/2006 1033   BUN 22 09/11/2018 0803   CREATININE 0.71 09/11/2018 0803   CREATININE 0.66 11/12/2015 1452   CALCIUM 9.4 09/11/2018  0803   PROT 7.3 09/11/2018 0803   ALBUMIN 4.4 09/11/2018 0803   AST 19 09/11/2018 0803   ALT 20 09/11/2018 0803   ALKPHOS 68 09/11/2018 0803   BILITOT 0.8 09/11/2018 0803   GFRNONAA >90 01/05/2015 1235   GFRAA >90 01/05/2015 1235       Assessment & Plan:  52 year old female with a history of Crohn's colitis on Humira since November 2018, history of iritis, hypertension, hyperlipidemia and kidney stones who is here for follow-up.   1.  Crohn's colitis, diagnosis December 2017, Humira initiation November 2018 --she is clearly  improved with Humira though I am not convinced she has reached complete remission.  She still having 1 or 2 days/week where she has left sided abdominal pain with loose stool and very occasional blood.  I have recommended that we repeat colonoscopy to assess disease activity.  She may need more frequent Humira dosing or the addition of immunomodulator therapy.  I am also going to check Humira antibody and drug level at trough.  She can come for lab work including Humira level and QuantiFERON gold on 12/06/2018, the day before her next Humira dose. --Labs as above --Continue Humira 40 mg every 14 days --Continue Lialda 2.4 g daily --Colonoscopy as above, we discussed the risk, benefits and alternatives and she is agreeable and wishes to proceed  2.  Need for shingles vaccine --Shingrix prescription written which she can get at a local pharmacy.  She is up-to-date with prior vaccination as it relates to #1  25 minutes spent with the patient today. Greater than 50% was spent in counseling and coordination of care with the patient

## 2018-12-02 ENCOUNTER — Other Ambulatory Visit: Payer: Self-pay | Admitting: Internal Medicine

## 2018-12-04 ENCOUNTER — Other Ambulatory Visit: Payer: Self-pay | Admitting: Internal Medicine

## 2018-12-06 ENCOUNTER — Other Ambulatory Visit: Payer: BLUE CROSS/BLUE SHIELD

## 2018-12-06 ENCOUNTER — Encounter: Payer: Self-pay | Admitting: Internal Medicine

## 2018-12-06 DIAGNOSIS — K5010 Crohn's disease of large intestine without complications: Secondary | ICD-10-CM

## 2018-12-09 LAB — QUANTIFERON-TB GOLD PLUS
Mitogen-NIL: >10 IU/mL
NIL: 0.02 IU/mL
QuantiFERON-TB Gold Plus: NEGATIVE
TB1-NIL: 0 IU/mL
TB2-NIL: 0 IU/mL

## 2018-12-10 ENCOUNTER — Other Ambulatory Visit: Payer: Self-pay

## 2018-12-10 ENCOUNTER — Telehealth: Payer: Self-pay | Admitting: Internal Medicine

## 2018-12-10 MED ORDER — ADALIMUMAB 40 MG/0.4ML ~~LOC~~ AJKT: 40.0000 mg | AUTO-INJECTOR | SUBCUTANEOUS | 11 refills | Status: DC

## 2018-12-10 MED ORDER — NA SULFATE-K SULFATE-MG SULF 17.5-3.13-1.6 GM/177ML PO SOLN: ORAL | 0 refills | Status: DC

## 2018-12-10 NOTE — Telephone Encounter (Signed)
Pt saw results via mychart message, script sent to pharmacy for pt.

## 2018-12-14 LAB — ADALIMUMAB+AB (SERIAL MONITOR)
Adalimumab Drug Level: 10 ug/mL
Anti-Adalimumab Antibody: <25 ng/mL

## 2018-12-30 ENCOUNTER — Telehealth: Payer: Self-pay | Admitting: Internal Medicine

## 2018-12-30 DIAGNOSIS — K5010 Crohn's disease of large intestine without complications: Secondary | ICD-10-CM

## 2018-12-30 NOTE — Telephone Encounter (Signed)
Left voicemail for patient to call back. I have a coupon Im glad to give pharmacy if she would like that should make prep 50 dollars.

## 2018-12-30 NOTE — Telephone Encounter (Signed)
Pt says prep soln is not covered by ins. Pt has upcoming colon 1/9.

## 2018-12-31 NOTE — Telephone Encounter (Signed)
I have spoken to patient and offered to call in pay no more than $50 coupon to pharmacy. She would like [place] to do this. Called and spoke to [place] at patient pharmacy. He took coupon and stated that he would need to call pharmacy help desk for what seemed to be a software issue. He will call us back if any issues.

## 2018-12-31 NOTE — Telephone Encounter (Signed)
Patient returning Dottie's call requesting to speak with her.

## 2019-01-02 ENCOUNTER — Encounter: Payer: Self-pay | Admitting: Internal Medicine

## 2019-01-02 ENCOUNTER — Ambulatory Visit (AMBULATORY_SURGERY_CENTER): Payer: BLUE CROSS/BLUE SHIELD | Admitting: Internal Medicine

## 2019-01-02 VITALS — BP 121/64 | HR 61 | Temp 96.9°F | Resp 11 | Ht 64.0 in | Wt 206.0 lb

## 2019-01-02 DIAGNOSIS — K5010 Crohn's disease of large intestine without complications: Secondary | ICD-10-CM | POA: Diagnosis present

## 2019-01-02 MED ORDER — SODIUM CHLORIDE 0.9 % IV SOLN: 500.0000 mL | Freq: Once | INTRAVENOUS | Status: DC

## 2019-01-02 NOTE — Progress Notes (Signed)
To PACU, VSS. Report to [email or web address] 

## 2019-01-02 NOTE — Patient Instructions (Signed)
Information on diverticulosis given to you today.  Office follow up in 6 months.  Continue Humira 40 mg every 14 days.  Continue Lialda 2.4g daily.  YOU HAD AN ENDOSCOPIC PROCEDURE TODAY AT [facility]:   Refer to the procedure report that was given to you for any specific questions about what was found during the examination.  If the procedure report does not answer your questions, please call your gastroenterologist to clarify.  If you requested that your care partner not be given the details of your procedure findings, then the procedure report has been included in a sealed envelope for you to review at your convenience later.  YOU SHOULD EXPECT: Some feelings of bloating in the abdomen. Passage of more gas than usual.  Walking can help get rid of the air that was put into your GI tract during the procedure and reduce the bloating. If you had a lower endoscopy (such as a colonoscopy or flexible sigmoidoscopy) you may notice spotting of blood in your stool or on the toilet paper. If you underwent a bowel prep for your procedure, you may not have a normal bowel movement for a few days.  Please Note:  You might notice some irritation and congestion in your nose or some drainage.  This is from the oxygen used during your procedure.  There is no need for concern and it should clear up in a day or so.  SYMPTOMS TO REPORT IMMEDIATELY:   Following lower endoscopy (colonoscopy or flexible sigmoidoscopy):  Excessive amounts of blood in the stool  Significant tenderness or worsening of abdominal pains  Swelling of the abdomen that is new, acute  Fever of 100F or higher   For urgent or emergent issues, a gastroenterologist can be reached at any hour by calling [phone].   DIET:  We do recommend a small meal at first, but then you may proceed to your regular diet.  Drink plenty of fluids but you should avoid alcoholic beverages for 24 hours.  ACTIVITY:  You should plan to  take it easy for the rest of today and you should NOT DRIVE or use heavy machinery until tomorrow (because of the sedation medicines used during the test).    FOLLOW UP: Our staff will call the number listed on your records the next business day following your procedure to check on you and address any questions or concerns that you may have regarding the information given to you following your procedure. If we do not reach you, we will leave a message.  However, if you are feeling well and you are not experiencing any problems, there is no need to return our call.  We will assume that you have returned to your regular daily activities without incident.  If any biopsies were taken you will be contacted by phone or by letter within the next 1-3 weeks.  Please call us at [phone] if you have not heard about the biopsies in 3 weeks.    SIGNATURES/CONFIDENTIALITY: You and/or your care partner have signed paperwork which will be entered into your electronic medical record.  These signatures attest to the fact that that the information above on your After Visit Summary has been reviewed and is understood.  Full responsibility of the confidentiality of this discharge information lies with you and/or your care-partner.

## 2019-01-02 NOTE — Progress Notes (Signed)
Pt's states no medical or surgical changes since previsit or office visit. 

## 2019-01-02 NOTE — Op Note (Signed)
[facility] Patient Name: Lindsey [name] Procedure Date: 01/02/2019 11:34 AM MRN: [id number] Endoscopist: Jerene Bears , MD Age: 53 Referring MD:  Date of Birth: 07/20/1966 Gender: Female Account #: [id number] Procedure:                Colonoscopy Indications:              Disease activity assessment of Crohn's disease of                            the colon, Assess therapeutic response to therapy                            of Crohn's disease of the colon; diagnosis Dec 2017                            (on Humira therapy with adequate drug level and no                            antibodies) Medicines:                Monitored Anesthesia Care Procedure:                Pre-Anesthesia Assessment:                           - Prior to the procedure, a History and Physical                            was performed, and patient medications and                            allergies were reviewed. The patient's tolerance of                            previous anesthesia was also reviewed. The risks                            and benefits of the procedure and the sedation                            options and risks were discussed with the patient.                            All questions were answered, and informed consent                            was obtained. Prior Anticoagulants: The patient has                            taken no previous anticoagulant or antiplatelet                            agents. ASA Grade Assessment: II - A patient with  mild systemic disease. After reviewing the risks                            and benefits, the patient was deemed in                            satisfactory condition to undergo the procedure.                           After obtaining informed consent, the colonoscope                            was passed under direct vision. Throughout the                            procedure, the patient's blood pressure, pulse,  and                            oxygen saturations were monitored continuously. The                            Model PCF-[id number]) scope was introduced                            through the anus and advanced to the terminal                            ileum. The colonoscopy was performed without                            difficulty. The patient tolerated the procedure                            well. The quality of the bowel preparation was                            excellent. The terminal ileum, ileocecal valve,                            appendiceal orifice, and rectum were photographed. Scope In: 11:37:02 AM Scope Out: 11:51:54 AM Scope Withdrawal Time: 0 hours 12 minutes 14 seconds  Total Procedure Duration: 0 hours 14 minutes 52 seconds  Findings:                 The digital rectal exam was normal.                           The terminal ileum appeared normal.                           A few small-mouthed diverticula were found in the                            sigmoid colon and descending colon.  Normal mucosa was found in the entire colon. No                            evidence of active Crohn's disease in the colon.                           The retroflexed view of the distal rectum and anal                            verge was normal and showed no anal or rectal                            abnormalities. Complications:            No immediate complications. Estimated Blood Loss:     Estimated blood loss: none. Impression:               - The examined portion of the ileum was normal.                           - Diverticulosis in the sigmoid colon and in the                            descending colon.                           - Colonic Crohn's disease in remission on Humira.                           - The distal rectum and anal verge are normal on                            retroflexion view.                           - No specimens  collected. Recommendation:           - Patient has a contact number available for                            emergencies. The signs and symptoms of potential                            delayed complications were discussed with the                            patient. Return to normal activities tomorrow.                            Written discharge instructions were provided to the                            patient.                           - Resume previous diet.                           -  Continue present medications.                           - Continue Humira 40 mg every 14 days. Continue                            Lialda 2.4 g daily.                           - Repeat colonoscopy in 5 years for surveillance.                           - Office follow-up in about 6 months. Jerene Bears, MD 01/02/2019 11:59:21 AM This report has been signed electronically.

## 2019-01-03 ENCOUNTER — Telehealth: Payer: Self-pay

## 2019-01-03 NOTE — Telephone Encounter (Signed)
  Follow up Call-  Call back number 01/02/2019 12/12/2016  Post procedure Call Back phone  # [phone]  Permission to leave phone message Yes Yes  Some recent data might be hidden     Patient questions:  Do you have a fever, pain , or abdominal swelling? No. Pain Score  0 *  Have you tolerated food without any problems? Yes.    Have you been able to return to your normal activities? Yes.    Do you have any questions about your discharge instructions: Diet   No. Medications  No. Follow up visit  No.  Do you have questions or concerns about your Care? No.  Actions: * If pain score is 4 or above: No action needed, pain <4.

## 2019-01-07 ENCOUNTER — Encounter: Payer: Self-pay | Admitting: Family Medicine

## 2019-01-09 ENCOUNTER — Other Ambulatory Visit: Payer: Self-pay | Admitting: Family Medicine

## 2019-01-09 DIAGNOSIS — T753XXA Motion sickness, initial encounter: Secondary | ICD-10-CM

## 2019-01-09 MED ORDER — SCOPOLAMINE 1 MG/3DAYS TD PT72: 1.0000 | MEDICATED_PATCH | TRANSDERMAL | 0 refills | Status: DC

## 2019-02-09 ENCOUNTER — Emergency Department
Admission: EM | Admit: 2019-02-09 | Discharge: 2019-02-09 | Disposition: A | Discharge status: Home / Self Care | Payer: BLUE CROSS/BLUE SHIELD | Source: Home / Self Care

## 2019-02-09 ENCOUNTER — Other Ambulatory Visit: Payer: Self-pay

## 2019-02-09 DIAGNOSIS — L509 Urticaria, unspecified: Secondary | ICD-10-CM

## 2019-02-09 DIAGNOSIS — J111 Influenza due to unidentified influenza virus with other respiratory manifestations: Secondary | ICD-10-CM

## 2019-02-09 MED ORDER — METHYLPREDNISOLONE ACETATE 80 MG/ML IJ SUSP
80.0000 mg | Freq: Once | INTRAMUSCULAR | Status: AC
  Administered 2019-02-09: 80 mg via INTRAMUSCULAR

## 2019-02-09 MED ORDER — METHYLPREDNISOLONE ACETATE 80 MG/ML IJ SUSP: 80.0000 mg | Freq: Once | INTRAMUSCULAR | 0 refills | Status: DC

## 2019-02-09 MED ORDER — BALOXAVIR MARBOXIL(40 MG DOSE) 2 X 20 MG PO TBPK: 40.0000 mg | ORAL_TABLET | Freq: Once | ORAL | 0 refills | Status: AC

## 2019-02-09 MED ORDER — ACYCLOVIR 400 MG PO TABS: 400.0000 mg | ORAL_TABLET | Freq: Every day | ORAL | 11 refills | Status: DC

## 2019-02-09 NOTE — ED Provider Notes (Signed)
[name]    CSN: [id number] Arrival date & time: 02/09/19  1152     History   Chief Complaint Chief Complaint  Patient presents with  . Urticaria    HPI Lindsey [name] is a 53 y.o. female.   Pts husband dx with FLU A. Doctor gave pt a script for Tamiflu. Started Friday. Now has hives she thinks from the Tamiflu. Also c/o cough, chills and fatigue. Doctor recommended getting an rx for the other flu med but her allergist would not rx.  Patient has recurrent hives.  This is been going on for 7 years.  She prefers to get a shot for this.  Patient's had no history of reaction to Tamiflu in the past.  She is not having shortness of breath but she has having a persistent cough.  The fever is gone.  She is having no nausea or vomiting.     Past Medical History:  Diagnosis Date  . Allergy   . Anxiety   . Colitis   . Crohn's disease ([place])   . Depression   . Diverticulosis   . Hyperlipidemia   . Hypertension   . Mild intermittent asthma    rarely uses inhaler  . Nephrolithiasis     Patient Active Problem List   Diagnosis Date Noted  . Crohn's disease without complication ([place]) 12/75/1700  . Mild persistent asthma without complication 17/49/4496  . Crohn's colitis ([place]) 02/07/2017  . Mild intermittent asthma 08/23/2016  . Chronic rhinitis 08/23/2016  . Acute cystitis without hematuria 05/05/2015  . Nephrolithiasis 05/05/2015  . Paronychia 09/21/2014  . 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 Obesity (BMI 30-39.9) 05/14/2014  . 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 Idiopathic urticaria 02/23/2012  . TINEA PEDIS 11/24/2010  . DYSURIA 09/19/2010  . History of allergy 09/19/2010  . BACK PAIN 04/01/2010  . PAIN IN THORACIC SPINE 12/13/2009  . VITAMIN B12 DEFICIENCY 03/09/2009  . SINUSITIS - ACUTE-NOS 03/09/2009  . NEPHROLITHIASIS, HX OF 07/29/2008  . BACK STRAIN, LUMBAR 02/18/2008  . Hyperlipidemia LDL goal <100 11/05/2007  . Anxiety state 08/15/2007  .  BRONCHITIS, ACUTE 08/15/2007  . FATIGUE 08/15/2007  . PALPITATIONS, OCCASIONAL 08/15/2007  . Essential hypertension 05/15/2007    Past Surgical History:  Procedure Laterality Date  . COMBINED HYSTEROSCOPY DIAGNOSTIC / D&C    . LEEP    . LITHOTRIPSY    . WISDOM TOOTH EXTRACTION      OB History   No obstetric history on file.      Home Medications    Prior to Admission medications   Medication Sig Start Date End Date Taking? Authorizing Provider  acyclovir (ZOVIRAX) 400 MG tablet Take 1 tablet (400 mg total) by mouth daily. 02/09/19   Robyn Haber, MD  Adalimumab (HUMIRA PEN) 40 MG/0.4ML PNKT Inject 40 mg into the skin every 14 (fourteen) days. 12/10/18   Pyrtle, Lajuan Lines, MD  albuterol (PROVENTIL HFA;VENTOLIN HFA) 108 (90 Base) MCG/ACT inhaler Inhale 2 puffs into the lungs every 4 (four) hours as needed for wheezing or shortness of breath. 08/23/16   Bobbitt, [place] Muta, MD  ALPRAZolam Duanne Moron) 0.5 MG tablet Take 1 tablet (0.5 mg total) by mouth as needed for anxiety. 01/15/17   Roma Schanz R, DO  AMBULATORY NON FORMULARY MEDICATION CBD Oil 1 dose twice a day    [provider]  amLODipine (NORVASC) 5 MG tablet Take 1 tablet (5 mg total) by mouth daily. 10/30/18   Roma Schanz R, DO  ASHWAGANDHA PO Take  1 tablet by mouth daily.    [provider]  Baloxavir Marboxil,40 MG Dose, (XOFLUZA) 2 x 20 MG TBPK Take 40 mg by mouth once for 1 dose. 02/09/19 02/09/19  Robyn Haber, MD  desloratadine (CLARINEX) 5 MG tablet Take 1 tablet by mouth daily. 08/29/18   [provider]  ergocalciferol (VITAMIN D2) 50000 units capsule Take 50,000 Units by mouth once a week.    [provider]  ezetimibe (ZETIA) 10 MG tablet TAKE ONE (1) TABLET BY MOUTH EVERY DAY. 09/10/18   Carollee Herter, Yvonne R, DO  FLUoxetine (PROZAC) 20 MG capsule TAKE ONE (1) CAPSULE EACH DAY 09/10/18   Lowne Chase, Yvonne R, DO  HUMIRA PEN 40 MG/0.4ML PNKT INJECT 40 MG UNDER THE  SKIN (SUBCUTANEOUS INJECTION) EVERY TWO WEEKS 10/29/18   Pyrtle, Lajuan Lines, MD  indapamide (LOZOL) 1.25 MG tablet  11/22/17   [provider]  levocetirizine (XYZAL) 5 MG tablet Take 1 tablet (5 mg total) by mouth every evening. 06/04/18   Charlies Silvers, MD  mesalamine (LIALDA) 1.2 g EC tablet Take 2.4 g by mouth daily.    [provider]  mesalamine (LIALDA) 1.2 g EC tablet Take 2 tablets (2.4 g total) by mouth daily. 12/02/18   Pyrtle, Lajuan Lines, MD  montelukast (SINGULAIR) 10 MG tablet Take 1 tablet (10 mg total) by mouth at bedtime. 06/04/18   Charlies Silvers, MD  Na Sulfate-K Sulfate-Mg Sulf 17.5-3.13-1.6 GM/177ML SOLN Take as directed 12/10/18   Pyrtle, Lajuan Lines, MD  omega-3 acid ethyl esters (LOVAZA) 1 g capsule Take 2 capsules (2 g total) by mouth 2 (two) times daily. 02/12/18   Roma Schanz R, DO  potassium citrate (UROCIT-K) 10 MEQ (1080 MG) SR tablet Take 2 tablets (20 mEq total) by mouth daily. 11/27/16   Ann Held, DO  scopolamine (TRANSDERM-SCOP, 1.5 MG,) 1 MG/3DAYS Place 1 patch (1.5 mg total) onto the skin every 3 (three) days. 01/09/19   Ann Held, DO  vitamin B-12 (CYANOCOBALAMIN) 1000 MCG tablet Take 1,000 mcg by mouth daily.    [provider]  Zoster Vaccine Adjuvanted [facility]) injection Inject 0.5 ml IM once, then inject 0.5 ml IM once 2-6 months thereafter. 11/27/18   Pyrtle, Lajuan Lines, MD    Family History Family History  Problem Relation Age of Onset  . Dementia Father   . Alzheimer's disease Father   . Hyperlipidemia Other   . Hypertension Other   . Diabetes Other   . Allergic rhinitis Mother   . Food Allergy Mother        peanut  . Asthma Brother   . Crohn's disease Neg Hx   . Esophageal cancer Neg Hx   . Stomach cancer Neg Hx   . Rectal cancer Neg Hx   . Breast cancer Neg Hx   . Angioedema Neg Hx   . Eczema Neg Hx   . Immunodeficiency Neg Hx   . Urticaria Neg Hx     Social History Social History   Tobacco Use   . Smoking status: Former Smoker    Packs/day: 1.00    Years: 10.00    Pack years: 10.00    Types: Cigarettes    Last attempt to quit: 12/26/1995    Years since quitting: 23.1  . Smokeless tobacco: Never Used  Substance Use Topics  . Alcohol use: Yes    Comment: occasional wine   . Drug use: No     Allergies  Azithromycin and Nitrofurantoin   Review of Systems Review of Systems  HENT: Negative.   Respiratory: Positive for cough. Negative for shortness of breath.   Gastrointestinal: Negative.   Skin: Positive for rash.  All other systems reviewed and are negative.    Physical Exam Triage Vital Signs ED Triage Vitals [02/09/19 1224]  Enc Vitals Group     BP (!) 144/92     Pulse Rate 93     Resp      Temp 98.1 F (36.7 C)     Temp Source Oral     SpO2 98 %     Weight 203 lb (92.1 kg)     Height 5' 4"  (1.626 m)     Head Circumference      Peak Flow      Pain Score 0     Pain Loc      Pain Edu?      Excl. in [place]?    No data found.  Updated Vital Signs BP (!) 144/92 (BP Location: Right Arm)   Pulse 93   Temp 98.1 F (36.7 C) (Oral)   Ht 5' 4"  (1.626 m)   Wt 92.1 kg   SpO2 98%   BMI 34.84 kg/m   Physical Exam Vitals signs and nursing note reviewed.  Constitutional:      General: She is not in acute distress.    Appearance: Normal appearance. She is not ill-appearing.  HENT:     Head: Normocephalic.     Mouth/Throat:     Mouth: Mucous membranes are moist.     Pharynx: Posterior oropharyngeal erythema present.  Eyes:     Conjunctiva/sclera: Conjunctivae normal.  Neck:     Musculoskeletal: Normal range of motion and neck supple.  Cardiovascular:     Rate and Rhythm: Normal rate and regular rhythm.  Pulmonary:     Effort: Pulmonary effort is normal. No respiratory distress.     Breath sounds: Normal breath sounds.  Musculoskeletal: Normal range of motion.  Skin:    Findings: Rash present.     Comments: Marked hives over arms and torso    Neurological:     General: No focal deficit present.     Mental Status: She is alert and oriented to person, place, and time.  Psychiatric:        Mood and Affect: Mood normal.        Behavior: Behavior normal.        Thought Content: Thought content normal.        Judgment: Judgment normal.      UC Treatments / Results  Labs (all labs ordered are listed, but only abnormal results are displayed) Labs Reviewed - No data to display  EKG None  Radiology No results found.  Procedures Procedures (including critical care time)  Medications Ordered in UC Medications  methylPREDNISolone acetate (DEPO-MEDROL) injection 80 mg (80 mg Intramuscular Given 02/09/19 1245)    Initial Impression / Assessment and Plan / UC Course  I have reviewed the triage vital signs and the nursing notes.  Pertinent labs & imaging results that were available during my care of the patient were reviewed by me and considered in my medical decision making (see chart for details).    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  ED Prescriptions    Medication Sig Dispense Auth. Provider   methylPREDNISolone acetate (DEPO-MEDROL) 80 MG/ML injection  (Status: Discontinued) Inject 1 mL (80 mg total) into the muscle once for 1 dose. 5 mL Robyn Haber, MD   Baloxavir Marboxil,40 MG Dose, (XOFLUZA) 2 x 20 MG TBPK Take 40 mg by mouth once for 1 dose. 2 each Robyn Haber, MD   acyclovir (ZOVIRAX) 400 MG tablet Take 1 tablet (400 mg total) by mouth daily. 30 tablet Robyn Haber, MD     Controlled Substance Prescriptions [place] Controlled Substance Registry consulted? Not Applicable   Robyn Haber, MD 02/09/19 1246

## 2019-02-09 NOTE — Discharge Instructions (Addendum)
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

## 2019-02-09 NOTE — ED Triage Notes (Signed)
Pts husband dx with FLU A. Doctor gave pt a script for Tamiflu. Started Friday. Now has hives she thinks from the Tamiflu. Also c/o cough, chills and fatigue. Doctor recommended getting an rx for the other flu med but her allergist would not rx.

## 2019-02-10 ENCOUNTER — Telehealth: Payer: Self-pay | Admitting: *Deleted

## 2019-02-10 NOTE — Telephone Encounter (Signed)
Received a message from after hours.   "Caller states she is taking Tamiflu.  She has hive."  It looks like patient went to an urgent care on yesterday.  I called and spoke with patient to check status.  She states that she is not a 100 percent better and not worse.

## 2019-02-10 NOTE — Telephone Encounter (Signed)
Stop the tamiflu and take benadryl

## 2019-02-11 NOTE — Telephone Encounter (Signed)
Patient notified. She did stop the tamiflu and was given xofluza.

## 2019-02-18 ENCOUNTER — Ambulatory Visit: Payer: BLUE CROSS/BLUE SHIELD | Admitting: Family

## 2019-02-18 ENCOUNTER — Encounter: Payer: Self-pay | Admitting: Family

## 2019-02-18 VITALS — BP 122/78 | HR 75 | Temp 98.2°F | Resp 16 | Ht 64.0 in | Wt 201.0 lb

## 2019-02-18 DIAGNOSIS — J40 Bronchitis, not specified as acute or chronic: Secondary | ICD-10-CM | POA: Diagnosis not present

## 2019-02-18 MED ORDER — DOXYCYCLINE HYCLATE 100 MG PO TABS: 100.0000 mg | ORAL_TABLET | Freq: Two times a day (BID) | ORAL | 0 refills | Status: DC

## 2019-02-18 MED ORDER — FLUTICASONE PROPIONATE HFA 110 MCG/ACT IN AERO: 2.0000 | INHALATION_SPRAY | Freq: Two times a day (BID) | RESPIRATORY_TRACT | 0 refills | Status: DC

## 2019-02-18 NOTE — Patient Instructions (Signed)
Please begin flovent. Continue albuterol as needed. Begin doxycycline for bronchitis. Call if symptoms worsen or if not improved in 3-4 days.

## 2019-02-18 NOTE — Progress Notes (Signed)
Subjective:    Patient ID: Lindsey [name], female    DOB: December 26, 1965, 53 y.o.   MRN: [id number]  HPI  Patient is a 53 yr old female who presents today with chief complaint of cough.   She reports that she went to urgent care and diagnosed with flu on 2/16. Has been coughing ever since but notes that the cough has worsened. Felt like she could not catch her breath last night (woke her up).  She is not taking anything for cough.  Reports + hx of asthma.  She has an inhaler which she has been using which helps some.  Makes her shakey though.    Review of Systems    see HPI  Past Medical History:  Diagnosis Date  . Allergy   . Anxiety   . Colitis   . Crohn's disease ([place])   . Depression   . Diverticulosis   . Hyperlipidemia   . Hypertension   . Mild intermittent asthma    rarely uses inhaler  . Nephrolithiasis      Social History   Socioeconomic History  . Marital status: Married    Spouse name: Not on file  . Number of children: Not on file  . Years of education: Not on file  . Highest education level: Not on file  Occupational History  . Not on file  Social Needs  . Financial resource strain: Not on file  . Food insecurity:    Worry: Not on file    Inability: Not on file  . Transportation needs:    Medical: Not on file    Non-medical: Not on file  Tobacco Use  . Smoking status: Former Smoker    Packs/day: 1.00    Years: 10.00    Pack years: 10.00    Types: Cigarettes    Last attempt to quit: 12/26/1995    Years since quitting: 23.1  . Smokeless tobacco: Never Used  Substance and Sexual Activity  . Alcohol use: Yes    Comment: occasional wine   . Drug use: No  . Sexual activity: Yes  Lifestyle  . Physical activity:    Days per week: Not on file    Minutes per session: Not on file  . Stress: Not on file  Relationships  . Social connections:    Talks on phone: Not on file    Gets together: Not on file    Attends religious service: Not on file   Active member of club or organization: Not on file    Attends meetings of clubs or organizations: Not on file    Relationship status: Not on file  . Intimate partner violence:    Fear of current or ex partner: Not on file    Emotionally abused: Not on file    Physically abused: Not on file    Forced sexual activity: Not on file  Other Topics Concern  . Not on file  Social History Narrative   Occupation: Babysit   Married   Regular exercise- yes    Past Surgical History:  Procedure Laterality Date  . COMBINED HYSTEROSCOPY DIAGNOSTIC / D&C    . LEEP    . LITHOTRIPSY    . WISDOM TOOTH EXTRACTION      Family History  Problem Relation Age of Onset  . Dementia Father   . Alzheimer's disease Father   . Hyperlipidemia Other   . Hypertension Other   . Diabetes Other   . Allergic rhinitis Mother   .  Food Allergy Mother        peanut  . Asthma Brother   . Crohn's disease Neg Hx   . Esophageal cancer Neg Hx   . Stomach cancer Neg Hx   . Rectal cancer Neg Hx   . Breast cancer Neg Hx   . Angioedema Neg Hx   . Eczema Neg Hx   . Immunodeficiency Neg Hx   . Urticaria Neg Hx     Allergies  Allergen Reactions  . Azithromycin Hives  . Nitrofurantoin Other (See Comments)    abd pain    Current Outpatient Medications on File Prior to Visit  Medication Sig Dispense Refill  . acyclovir (ZOVIRAX) 400 MG tablet Take 1 tablet (400 mg total) by mouth daily. 30 tablet 11  . Adalimumab (HUMIRA PEN) 40 MG/0.4ML PNKT Inject 40 mg into the skin every 14 (fourteen) days. 2 each 11  . albuterol (PROVENTIL HFA;VENTOLIN HFA) 108 (90 Base) MCG/ACT inhaler Inhale 2 puffs into the lungs every 4 (four) hours as needed for wheezing or shortness of breath. 1 Inhaler 2  . ALPRAZolam (XANAX) 0.5 MG tablet Take 1 tablet (0.5 mg total) by mouth as needed for anxiety. 10 tablet 0  . AMBULATORY NON FORMULARY MEDICATION CBD Oil 1 dose twice a day    . amLODipine (NORVASC) 5 MG tablet Take 1 tablet (5 mg  total) by mouth daily. 90 tablet 2  . ASHWAGANDHA PO Take 1 tablet by mouth daily.    Marland Kitchen desloratadine (CLARINEX) 5 MG tablet Take 1 tablet by mouth daily.  4  . ergocalciferol (VITAMIN D2) 50000 units capsule Take 50,000 Units by mouth once a week.    . ezetimibe (ZETIA) 10 MG tablet TAKE ONE (1) TABLET BY MOUTH EVERY DAY. 90 tablet 1  . FLUoxetine (PROZAC) 20 MG capsule TAKE ONE (1) CAPSULE EACH DAY 90 capsule 3  . HUMIRA PEN 40 MG/0.4ML PNKT INJECT 40 MG UNDER THE SKIN (SUBCUTANEOUS INJECTION) EVERY TWO WEEKS 2 each 0  . indapamide (LOZOL) 1.25 MG tablet   9  . levocetirizine (XYZAL) 5 MG tablet Take 1 tablet (5 mg total) by mouth every evening. 30 tablet 0  . mesalamine (LIALDA) 1.2 g EC tablet Take 2.4 g by mouth daily.    . mesalamine (LIALDA) 1.2 g EC tablet Take 2 tablets (2.4 g total) by mouth daily. 60 tablet 3  . montelukast (SINGULAIR) 10 MG tablet Take 1 tablet (10 mg total) by mouth at bedtime. 30 tablet 0  . omega-3 acid ethyl esters (LOVAZA) 1 g capsule Take 2 capsules (2 g total) by mouth 2 (two) times daily. 360 capsule 1  . potassium citrate (UROCIT-K) 10 MEQ (1080 MG) SR tablet Take 2 tablets (20 mEq total) by mouth daily. 60 tablet 5  . vitamin B-12 (CYANOCOBALAMIN) 1000 MCG tablet Take 1,000 mcg by mouth daily.    Marland Kitchen Zoster Vaccine Adjuvanted [facility] injection Inject 0.5 ml IM once, then inject 0.5 ml IM once 2-6 months thereafter. 0.5 mL 1  . scopolamine (TRANSDERM-SCOP, 1.5 MG,) 1 MG/3DAYS Place 1 patch (1.5 mg total) onto the skin every 3 (three) days. 5 patch 0   No current facility-administered medications on file prior to visit.     BP 122/78 (BP Location: Right Arm, Patient Position: Sitting, Cuff Size: Large)   Pulse 75   Temp 98.2 F (36.8 C) (Oral)   Resp 16   Ht 5' 4"  (1.626 m)   Wt 201 lb (91.2 kg)   SpO2  98%   BMI 34.50 kg/m    Objective:   Physical Exam Constitutional:      Appearance: She is well-developed.  HENT:     Right Ear: Tympanic  membrane and ear canal normal.     Left Ear: Tympanic membrane and ear canal normal.  Neck:     Musculoskeletal: Neck supple.     Thyroid: No thyromegaly.  Cardiovascular:     Rate and Rhythm: Normal rate and regular rhythm.     Heart sounds: Normal heart sounds. No murmur.  Pulmonary:     Effort: Pulmonary effort is normal. No respiratory distress.     Breath sounds: Normal breath sounds. No wheezing.  Skin:    General: Skin is warm and dry.  Neurological:     Mental Status: She is alert and oriented to person, place, and time.  Psychiatric:        Behavior: Behavior normal.        Thought Content: Thought content normal.        Judgment: Judgment normal.           Assessment & Plan:  Bronchitis- recommended prednisone.  She declines but is agreeable to flovent. Will rx with albuterol, doxycycline and flovent. She is advised to call if symptoms worsen or if they are not improved in 3-4 days.

## 2019-03-06 ENCOUNTER — Other Ambulatory Visit: Payer: Self-pay | Admitting: Family Medicine

## 2019-03-06 DIAGNOSIS — E785 Hyperlipidemia, unspecified: Secondary | ICD-10-CM

## 2019-03-11 ENCOUNTER — Encounter: Payer: Self-pay | Admitting: Family Medicine

## 2019-03-13 ENCOUNTER — Encounter: Payer: Self-pay | Admitting: Family Medicine

## 2019-03-13 ENCOUNTER — Other Ambulatory Visit: Payer: Self-pay

## 2019-03-13 ENCOUNTER — Ambulatory Visit: Payer: BLUE CROSS/BLUE SHIELD | Admitting: Family Medicine

## 2019-03-13 VITALS — BP 120/82 | HR 68 | Temp 98.2°F | Resp 12 | Ht 64.0 in | Wt 204.4 lb

## 2019-03-13 DIAGNOSIS — E785 Hyperlipidemia, unspecified: Secondary | ICD-10-CM | POA: Diagnosis not present

## 2019-03-13 DIAGNOSIS — F411 Generalized anxiety disorder: Secondary | ICD-10-CM | POA: Diagnosis not present

## 2019-03-13 DIAGNOSIS — Z79899 Other long term (current) drug therapy: Secondary | ICD-10-CM | POA: Diagnosis not present

## 2019-03-13 LAB — LIPID PANEL
Cholesterol: 215 mg/dL — ABNORMAL HIGH (ref 0–200)
HDL: 64.9 mg/dL (ref >39.00)
LDL Cholesterol: 130 mg/dL — ABNORMAL HIGH (ref 0–99)
NonHDL: 150.13
Total CHOL/HDL Ratio: 3
Triglycerides: 99 mg/dL (ref 0.0–149.0)
VLDL: 19.8 mg/dL (ref 0.0–40.0)

## 2019-03-13 LAB — COMPREHENSIVE METABOLIC PANEL
ALT: 25 U/L (ref 0–35)
AST: 25 U/L (ref 0–37)
Albumin: 4.2 g/dL (ref 3.5–5.2)
Alkaline Phosphatase: 58 U/L (ref 39–117)
BUN: 17 mg/dL (ref 6–23)
CO2: 30 mEq/L (ref 19–32)
Calcium: 9.3 mg/dL (ref 8.4–10.5)
Chloride: 99 mEq/L (ref 96–112)
Creatinine, Ser: 0.69 mg/dL (ref 0.40–1.20)
GFR: 89.18 mL/min (ref >60.00)
Glucose, Bld: 100 mg/dL — ABNORMAL HIGH (ref 70–99)
Potassium: 3.6 mEq/L (ref 3.5–5.1)
Sodium: 137 mEq/L (ref 135–145)
Total Bilirubin: 0.6 mg/dL (ref 0.2–1.2)
Total Protein: 7 g/dL (ref 6.0–8.3)

## 2019-03-13 NOTE — Progress Notes (Signed)
Patient ID: Lindsey [name], female    DOB: 25-Jun-1966  Age: 53 y.o. MRN: [id number]    Subjective:  Subjective  HPI Lindsey [name] presents for anxiety , chol  No complaints.   Review of Systems  Constitutional: Negative for appetite change, diaphoresis, fatigue and unexpected weight change.  Eyes: Negative for pain, redness and visual disturbance.  Respiratory: Negative for cough, chest tightness, shortness of breath and wheezing.   Cardiovascular: Negative for chest pain, palpitations and leg swelling.  Endocrine: Negative for cold intolerance, heat intolerance, polydipsia, polyphagia and polyuria.  Genitourinary: Negative for difficulty urinating, dysuria and frequency.  Neurological: Negative for dizziness, light-headedness, numbness and headaches.    History Past Medical History:  Diagnosis Date  . Allergy   . Anxiety   . Colitis   . Crohn's disease ([employer])   . Depression   . Diverticulosis   . Hyperlipidemia   . Hypertension   . Mild intermittent asthma    rarely uses inhaler  . Nephrolithiasis     She has a past surgical history that includes LEEP; Lithotripsy; Wisdom tooth extraction; and Combined hysteroscopy diagnostic / D&C.   Her family history includes Allergic rhinitis in her mother; Alzheimer's disease in her father; Asthma in her brother; Dementia in her father; Diabetes in an other family member; Food Allergy in her mother; Hyperlipidemia in an other family member; Hypertension in an other family member.She reports that she quit smoking about 23 years ago. Her smoking use included cigarettes. She has a 10.00 pack-year smoking history. She has never used smokeless tobacco. She reports current alcohol use. She reports that she does not use drugs.  Current Outpatient Medications on File Prior to Visit  Medication Sig Dispense Refill  . acyclovir (ZOVIRAX) 400 MG tablet Take 1 tablet (400 mg total) by mouth daily. 30 tablet 11  . Adalimumab (HUMIRA PEN) 40  MG/0.4ML PNKT Inject 40 mg into the skin every 14 (fourteen) days. 2 each 11  . albuterol (PROVENTIL HFA;VENTOLIN HFA) 108 (90 Base) MCG/ACT inhaler Inhale 2 puffs into the lungs every 4 (four) hours as needed for wheezing or shortness of breath. 1 Inhaler 2  . ALPRAZolam (XANAX) 0.5 MG tablet Take 1 tablet (0.5 mg total) by mouth as needed for anxiety. 10 tablet 0  . AMBULATORY NON FORMULARY MEDICATION CBD Oil 1 dose twice a day    . amLODipine (NORVASC) 5 MG tablet Take 1 tablet (5 mg total) by mouth daily. 90 tablet 2  . ASHWAGANDHA PO Take 1 tablet by mouth daily.    Marland Kitchen desloratadine (CLARINEX) 5 MG tablet Take 1 tablet by mouth daily.  4  . ergocalciferol (VITAMIN D2) 50000 units capsule Take 50,000 Units by mouth once a week.    . ezetimibe (ZETIA) 10 MG tablet Take 1 tablet (10 mg total) by mouth daily. 90 tablet 3  . FLUoxetine (PROZAC) 20 MG capsule TAKE ONE (1) CAPSULE EACH DAY 90 capsule 3  . fluticasone (FLOVENT HFA) 110 MCG/ACT inhaler Inhale 2 puffs into the lungs 2 (two) times daily. 1 Inhaler 0  . HUMIRA PEN 40 MG/0.4ML PNKT INJECT 40 MG UNDER THE SKIN (SUBCUTANEOUS INJECTION) EVERY TWO WEEKS 2 each 0  . indapamide (LOZOL) 1.25 MG tablet   9  . levocetirizine (XYZAL) 5 MG tablet Take 1 tablet (5 mg total) by mouth every evening. 30 tablet 0  . mesalamine (LIALDA) 1.2 g EC tablet Take 2.4 g by mouth daily.    . mesalamine (LIALDA) 1.2 g EC tablet  Take 2 tablets (2.4 g total) by mouth daily. 60 tablet 3  . montelukast (SINGULAIR) 10 MG tablet Take 1 tablet (10 mg total) by mouth at bedtime. 30 tablet 0  . omega-3 acid ethyl esters (LOVAZA) 1 g capsule Take 2 capsules (2 g total) by mouth 2 (two) times daily. 360 capsule 1  . potassium citrate (UROCIT-K) 10 MEQ (1080 MG) SR tablet Take 2 tablets (20 mEq total) by mouth daily. 60 tablet 5  . vitamin B-12 (CYANOCOBALAMIN) 1000 MCG tablet Take 1,000 mcg by mouth daily.    Marland Kitchen Zoster Vaccine Adjuvanted [facility]) injection Inject 0.5 ml IM  once, then inject 0.5 ml IM once 2-6 months thereafter. 0.5 mL 1   No current facility-administered medications on file prior to visit.      Objective:  Objective  Physical Exam Constitutional:      Appearance: She is well-developed.  HENT:     Head: Normocephalic and atraumatic.  Eyes:     Conjunctiva/sclera: Conjunctivae normal.  Neck:     Musculoskeletal: Normal range of motion and neck supple.     Thyroid: No thyromegaly.     Vascular: No carotid bruit or JVD.  Cardiovascular:     Rate and Rhythm: Normal rate and regular rhythm.     Heart sounds: Normal heart sounds. No murmur.  Pulmonary:     Effort: Pulmonary effort is normal. No respiratory distress.     Breath sounds: Normal breath sounds. No wheezing or rales.  Chest:     Chest wall: No tenderness.  Neurological:     Mental Status: She is alert and oriented to person, place, and time.    BP 120/82 (BP Location: Right Arm, Cuff Size: Large)   Pulse 68   Temp 98.2 F (36.8 C) (Oral)   Resp 12   Ht 5' 4"  (1.626 m)   Wt 204 lb 6.4 oz (92.7 kg)   SpO2 98%   BMI 35.09 kg/m  Wt Readings from Last 3 Encounters:  03/13/19 204 lb 6.4 oz (92.7 kg)  02/18/19 201 lb (91.2 kg)  02/09/19 203 lb (92.1 kg)     Lab Results  Component Value Date   WBC 6.7 09/11/2018   HGB 14.2 09/11/2018   HCT 42.6 09/11/2018   PLT 246.0 09/11/2018   GLUCOSE 100 (H) 03/13/2019   CHOL 215 (H) 03/13/2019   TRIG 99.0 03/13/2019   HDL 64.90 03/13/2019   LDLDIRECT 123.2 12/20/2011   LDLCALC 130 (H) 03/13/2019   ALT 25 03/13/2019   AST 25 03/13/2019   NA 137 03/13/2019   K 3.6 03/13/2019   CL 99 03/13/2019   CREATININE 0.69 03/13/2019   BUN 17 03/13/2019   CO2 30 03/13/2019   TSH 3.59 09/11/2018   HGBA1C 5.8 10/24/2016    No results found.   Assessment & Plan:  Plan  I have discontinued Lindsey [name]'s scopolamine and doxycycline. I am also having her maintain her albuterol, potassium citrate, ALPRAZolam, indapamide,  omega-3 acid ethyl esters, ergocalciferol, vitamin B-12, ASHWAGANDHA PO, AMBULATORY NON FORMULARY MEDICATION, montelukast, levocetirizine, desloratadine, FLUoxetine, Humira Pen, amLODipine, mesalamine, Zoster Vaccine Adjuvanted, mesalamine, Adalimumab, acyclovir, fluticasone, and ezetimibe.  No orders of the defined types were placed in this encounter.   Problem List Items Addressed This Visit      Unprioritized   Anxiety state    Stable con't meds      Relevant Orders   Pain Mgmt, Profile 8 w/Conf, U   High risk medication use  Relevant Orders   Pain Mgmt, Profile 8 w/Conf, U   Hyperlipidemia - Primary   Relevant Orders   Lipid panel (Completed)   Comprehensive metabolic panel (Completed)   Hyperlipidemia LDL goal <100    Tolerating statin, encouraged heart healthy diet, avoid trans fats, minimize simple carbs and saturated fats. Increase exercise as tolerated         Follow-up: Return in about 6 months (around 09/13/2019), or if symptoms worsen or fail to improve.  Ann Held, DO

## 2019-03-13 NOTE — Assessment & Plan Note (Signed)
Tolerating statin, encouraged heart healthy diet, avoid trans fats, minimize simple carbs and saturated fats. Increase exercise as tolerated 

## 2019-03-13 NOTE — Assessment & Plan Note (Signed)
Stable con't meds 

## 2019-03-13 NOTE — Patient Instructions (Signed)

## 2019-03-15 LAB — PAIN MGMT, PROFILE 8 W/CONF, U
6 Acetylmorphine: NEGATIVE ng/mL (ref <10)
Alcohol Metabolites: NEGATIVE ng/mL (ref <500)
Amphetamines: NEGATIVE ng/mL (ref <500)
Benzodiazepines: NEGATIVE ng/mL (ref <100)
Buprenorphine, Urine: NEGATIVE ng/mL (ref <5)
Cocaine Metabolite: NEGATIVE ng/mL (ref <150)
Creatinine: 107.8 mg/dL
MDMA: NEGATIVE ng/mL (ref <500)
Marijuana Metabolite: 17 ng/mL — ABNORMAL HIGH (ref <5)
Marijuana Metabolite: POSITIVE ng/mL — AB (ref <20)
Opiates: NEGATIVE ng/mL (ref <100)
Oxidant: NEGATIVE ug/mL (ref <200)
Oxycodone: NEGATIVE ng/mL (ref <100)
pH: 7.11 (ref 4.5–9.0)

## 2019-04-22 ENCOUNTER — Other Ambulatory Visit: Payer: Self-pay | Admitting: Family Medicine

## 2019-04-30 ENCOUNTER — Other Ambulatory Visit: Payer: Self-pay | Admitting: Family Medicine

## 2019-06-02 ENCOUNTER — Encounter: Payer: Self-pay | Admitting: Family Medicine

## 2019-06-03 ENCOUNTER — Other Ambulatory Visit: Payer: Self-pay | Admitting: Family Medicine

## 2019-07-30 ENCOUNTER — Encounter: Payer: Self-pay | Admitting: Internal Medicine

## 2019-08-13 ENCOUNTER — Other Ambulatory Visit: Payer: Self-pay | Admitting: Internal Medicine

## 2019-09-10 ENCOUNTER — Telehealth: Payer: Self-pay | Admitting: Nurse Practitioner

## 2019-09-10 ENCOUNTER — Encounter: Payer: Self-pay | Admitting: Nurse Practitioner

## 2019-09-10 ENCOUNTER — Other Ambulatory Visit (INDEPENDENT_AMBULATORY_CARE_PROVIDER_SITE_OTHER): Payer: BC Managed Care – PPO

## 2019-09-10 ENCOUNTER — Other Ambulatory Visit: Payer: Self-pay

## 2019-09-10 ENCOUNTER — Ambulatory Visit: Payer: BC Managed Care – PPO | Admitting: Nurse Practitioner

## 2019-09-10 VITALS — BP 130/88 | HR 82 | Temp 98.7°F | Ht 64.0 in | Wt 204.0 lb

## 2019-09-10 DIAGNOSIS — K50119 Crohn's disease of large intestine with unspecified complications: Secondary | ICD-10-CM

## 2019-09-10 DIAGNOSIS — Z23 Encounter for immunization: Secondary | ICD-10-CM

## 2019-09-10 LAB — CBC WITH DIFFERENTIAL/PLATELET
Basophils Absolute: 0.1 10*3/uL (ref 0.0–0.1)
Basophils Relative: 1.4 % (ref 0.0–3.0)
Eosinophils Absolute: 0.5 10*3/uL (ref 0.0–0.7)
Eosinophils Relative: 4.7 % (ref 0.0–5.0)
HCT: 45.4 % (ref 36.0–46.0)
Hemoglobin: 14.9 g/dL (ref 12.0–15.0)
Lymphocytes Relative: 37.8 % (ref 12.0–46.0)
Lymphs Abs: 3.7 10*3/uL (ref 0.7–4.0)
MCHC: 32.9 g/dL (ref 30.0–36.0)
MCV: 88.6 fl (ref 78.0–100.0)
Monocytes Absolute: 0.8 10*3/uL (ref 0.1–1.0)
Monocytes Relative: 8.8 % (ref 3.0–12.0)
Neutro Abs: 4.6 10*3/uL (ref 1.4–7.7)
Neutrophils Relative %: 47.3 % (ref 43.0–77.0)
Platelets: 282 10*3/uL (ref 150.0–400.0)
RBC: 5.13 Mil/uL — ABNORMAL HIGH (ref 3.87–5.11)
RDW: 13.8 % (ref 11.5–15.5)
WBC: 9.7 10*3/uL (ref 4.0–10.5)

## 2019-09-10 LAB — HIGH SENSITIVITY CRP: CRP, High Sensitivity: 7.14 mg/L — ABNORMAL HIGH (ref 0.000–5.000)

## 2019-09-10 LAB — SEDIMENTATION RATE: Sed Rate: 16 mm/hr (ref 0–30)

## 2019-09-10 MED ORDER — HUMIRA (2 PEN) 40 MG/0.4ML ~~LOC~~ AJKT: 40.0000 mg | AUTO-INJECTOR | SUBCUTANEOUS | 5 refills | Status: DC

## 2019-09-10 NOTE — Progress Notes (Signed)
Chief Complaint:    Needs Humira refill   IMPRESSION and PLAN:    53 year old female with Crohn's colitis diagnosed 2017, maintained on Humira 40 mg every 2 weeks and Lialda 2.4 mg daily.  Just in the last 6 weeks she has experienced some recurrent LUQ discomfort, nausea, and bowel changes.  She thinks some of her symptoms could be secondary to anxiety.  Feeling a little better since increasing her CBD oil.  -Since she is improving will see what happens over the next couple weeks and what labs show. -In the interim will obtain CBC, ESR and CRP -Continue Lialda 2.4 g daily -Continue Humira 40 mg every 2 weeks, refills given -Recent antibiotics for UTI.  Right now bowel movements alternating between loose and normal.  Advised to call our office to submit stool study for C. Difficile if having more diarrhea -Next colonoscopy due January 2025 -She will need the flu vaccine, will offer it to her today -Got zoster vaccine December 2019. -It appears she is overdue for her Pap smear -Continue vitamin D supplementation -If she continues to improve then follow-up with Dr. Hilarie Fredrickson in 6 months. He can refill need for further vaccinations / ? Dexa at that time.      HPI:     Patient is a 53 year old female with history of hypertension, hyperlipidemia, nephrolithiasis and Crohn's colitis diagnosed 2017.  She is maintained on Humira 40 mg Q 2 weeks and Lialda 2.4 grams / daily.  Patient was last seen at time of colonoscopy January 2020 which revealed that her Crohn's colitis was in remission.  She needed a refill on Humira which was given but patient was asked to come in for follow-up as it has been over 6 months since her last visit  Lindsey [name] says she had been doing well until about 6 weeks ago.  Since then she has mild flare like symptoms with ntermittent LUQ pain, especially after eating.  Some associated nausea without vomiting.  Her bowel movements vary between solid and loose with mucus  but no blood.  No fevers, no arthralgias.  She increased her CBD oil 2 weeks ago which seems to be helping some of the LUQ discomfort. Her son has problems with anxiety which in turn is making patient anxious herself.  She wonders if some of her symptoms are not related to anxiety   Colonoscopy January 2020  to assess response to therapy -The examined portion of the ileum was normal. -Diverticulosis in the sigmoid colon and in the descending colon. -Colonic Crohn's disease in remission on Humira. -The distal rectum and anal verge are normal on retroflexion view.  Review of systems:     No chest pain, no SOB, no fevers, no urinary sx   Past Medical History:  Diagnosis Date   Allergy    Anxiety    Colitis    Crohn's disease ([place])    Depression    Diverticulosis    Hyperlipidemia    Hypertension    Mild intermittent asthma    rarely uses inhaler   Nephrolithiasis     Patient's surgical history, family medical history, social history, medications and allergies were all reviewed in Epic   Current Outpatient Medications  Medication Sig Dispense Refill   acyclovir (ZOVIRAX) 400 MG tablet Take 1 tablet (400 mg total) by mouth daily. 30 tablet 11   Adalimumab (HUMIRA PEN) 40 MG/0.4ML PNKT Inject 40 mg into the skin every 14 (fourteen) days. 2 each 11  albuterol (PROVENTIL HFA;VENTOLIN HFA) 108 (90 Base) MCG/ACT inhaler Inhale 2 puffs into the lungs every 4 (four) hours as needed for wheezing or shortness of breath. 1 Inhaler 2   ALPRAZolam (XANAX) 0.5 MG tablet Take 1 tablet (0.5 mg total) by mouth as needed for anxiety. 10 tablet 0   AMBULATORY NON FORMULARY MEDICATION CBD Oil 1 dose twice a day     amLODipine (NORVASC) 5 MG tablet TAKE ONE (1) TABLET BY MOUTH EACH DAY 90 tablet 0   ASHWAGANDHA PO Take 1 tablet by mouth daily.     desloratadine (CLARINEX) 5 MG tablet Take 1 tablet by mouth daily.  4   ergocalciferol (VITAMIN D2) 50000 units capsule Take 50,000  Units by mouth once a week.     ezetimibe (ZETIA) 10 MG tablet Take 1 tablet (10 mg total) by mouth daily. 90 tablet 3   FLUoxetine (PROZAC) 20 MG capsule TAKE ONE (1) CAPSULE EACH DAY 90 capsule 3   fluticasone (FLOVENT HFA) 110 MCG/ACT inhaler Inhale 2 puffs into the lungs 2 (two) times daily. 1 Inhaler 0   indapamide (LOZOL) 1.25 MG tablet   9   levocetirizine (XYZAL) 5 MG tablet Take 1 tablet (5 mg total) by mouth every evening. 30 tablet 0   mesalamine (LIALDA) 1.2 g EC tablet MUST HAVE OFFICE VISIT FOR FURTHER REFILLS 120 tablet 0   montelukast (SINGULAIR) 10 MG tablet Take 1 tablet (10 mg total) by mouth at bedtime. 30 tablet 0   omega-3 acid ethyl esters (LOVAZA) 1 g capsule TAKE 2 CAPSULES BY MOUTH TWICE A DAY 360 capsule 0   potassium citrate (UROCIT-K) 10 MEQ (1080 MG) SR tablet Take 2 tablets (20 mEq total) by mouth daily. 60 tablet 5   vitamin B-12 (CYANOCOBALAMIN) 1000 MCG tablet Take 1,000 mcg by mouth daily.     Zoster Vaccine Adjuvanted [facility]) injection Inject 0.5 ml IM once, then inject 0.5 ml IM once 2-6 months thereafter. 0.5 mL 1   No current facility-administered medications for this visit.     Physical Exam:     BP 130/88    Pulse 82    Temp 98.7 F (37.1 C)    Ht 5' 4"  (1.626 m)    Wt 204 lb (92.5 kg)    BMI 35.02 kg/m   GENERAL:  Pleasant female in NAD PSYCH: : Cooperative, normal affect EENT:  conjunctiva pink, mucous membranes moist, neck supple without masses CARDIAC:  RRR, no murmur heard, no peripheral edema PULM: Normal respiratory effort, lungs CTA bilaterally, no wheezing ABDOMEN:  Nondistended, soft, nontender. No obvious masses, no hepatomegaly,  normal bowel sounds SKIN:  turgor, no lesions seen Musculoskeletal:  Normal muscle tone, normal strength NEURO: Alert and oriented x 3, no focal neurologic deficits   Lindsey [name] , NP 09/10/2019, 11:47 AM

## 2019-09-10 NOTE — Telephone Encounter (Signed)
Pt states that rf for humira needs to go to [facility] instead of her regular pharmacy.

## 2019-09-10 NOTE — Patient Instructions (Signed)
If you are age 53 or older, your body mass index should be between 23-30. Your Body mass index is 35.02 kg/m. If this is out of the aforementioned range listed, please consider follow up with your Primary Care Provider.  If you are age 60 or younger, your body mass index should be between 19-25. Your Body mass index is 35.02 kg/m. If this is out of the aformentioned range listed, please consider follow up with your Primary Care Provider.   Your provider has requested that you go to the basement level for lab work before leaving today. Press "B" on the elevator. The lab is located at the first door on the left as you exit the elevator.  We have sent the following medications to your pharmacy for you to pick up at your convenience: [place].  Call if not improving or if develops diarrhea.  You have been given you flu vaccine today.  Follow up with Dr. Hilarie Fredrickson in six months.  You will be placed on recall for an appointment.  Thank you for choosing me and [facility].   Tye Savoy, NP

## 2019-09-11 ENCOUNTER — Other Ambulatory Visit: Payer: Self-pay

## 2019-09-11 MED ORDER — HUMIRA (2 PEN) 40 MG/0.4ML ~~LOC~~ AJKT: 40.0000 mg | AUTO-INJECTOR | SUBCUTANEOUS | 5 refills | Status: DC

## 2019-09-11 NOTE — Telephone Encounter (Signed)
Resubmitted to specialty pharmacy.

## 2019-09-16 ENCOUNTER — Other Ambulatory Visit: Payer: Self-pay | Admitting: Obstetrics & Gynecology

## 2019-09-16 DIAGNOSIS — N6320 Unspecified lump in the left breast, unspecified quadrant: Secondary | ICD-10-CM

## 2019-09-25 NOTE — Progress Notes (Signed)
Addendum: Reviewed and agree with assessment and management plan. Would recommend sooner follow-up if she continues to have her left upper quadrant discomfort and bowel changes given her history of colitis. Sharin Altidor, Lajuan Lines, MD

## 2019-10-14 ENCOUNTER — Other Ambulatory Visit: Payer: Self-pay | Admitting: Internal Medicine

## 2019-10-15 ENCOUNTER — Other Ambulatory Visit: Payer: Self-pay | Admitting: Obstetrics & Gynecology

## 2019-10-15 ENCOUNTER — Ambulatory Visit
Admission: RE | Admit: 2019-10-15 | Discharge: 2019-10-15 | Disposition: A | Discharge status: Home / Self Care | Payer: BC Managed Care – PPO | Source: Ambulatory Visit | Attending: Obstetrics & Gynecology | Admitting: Obstetrics & Gynecology

## 2019-10-15 ENCOUNTER — Other Ambulatory Visit: Payer: Self-pay

## 2019-10-15 DIAGNOSIS — N6320 Unspecified lump in the left breast, unspecified quadrant: Secondary | ICD-10-CM

## 2019-10-17 ENCOUNTER — Other Ambulatory Visit: Payer: Self-pay

## 2019-10-17 ENCOUNTER — Ambulatory Visit
Admission: RE | Admit: 2019-10-17 | Discharge: 2019-10-17 | Disposition: A | Discharge status: Home / Self Care | Payer: BC Managed Care – PPO | Source: Ambulatory Visit | Attending: Obstetrics & Gynecology | Admitting: Obstetrics & Gynecology

## 2019-10-17 DIAGNOSIS — N6320 Unspecified lump in the left breast, unspecified quadrant: Secondary | ICD-10-CM

## 2019-10-17 HISTORY — PX: BREAST BIOPSY: SHX20

## 2019-10-20 ENCOUNTER — Other Ambulatory Visit: Payer: Self-pay | Admitting: Obstetrics & Gynecology

## 2019-10-20 DIAGNOSIS — R921 Mammographic calcification found on diagnostic imaging of breast: Secondary | ICD-10-CM

## 2019-10-21 ENCOUNTER — Ambulatory Visit: Payer: Self-pay | Admitting: Surgery

## 2019-10-21 DIAGNOSIS — D0512 Intraductal carcinoma in situ of left breast: Secondary | ICD-10-CM

## 2019-10-21 NOTE — H&P (Signed)
Lindsey [name] Documented: 10/21/2019 3:56 PM Location: [facility] Patient #: [id number] DOB: July 01, 1966 Married / Language: English / Race: White Female  History of Present Illness Lindsey Moores A. Tennis Mckinnon MD; 10/21/2019 4:52 PM) Patient words: Patient sent at the request of Dr. Rosana [name] of the [facility] for abnormal left breast mammogram. An area in the left breast upper quadrant was being followed with short-term follow-up secondary to microcalcifications. 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 She had a area biopsied adjacent to that which was cystic and came back as fibrocystic change at around 12:00. This corresponded to the mass. A third area biopsied it is going to be done next week in the left breast. No history of breast pain, nipple discharge or family history. She is sore from her biopsy and bruised.      Patient for short-term follow-up probably benign left breast mass.  EXAM: DIGITAL DIAGNOSTIC BILATERAL MAMMOGRAM WITH CAD AND TOMO  ULTRASOUND LEFT BREAST  COMPARISON: Previous exam(s).  ACR Breast Density Category b: There are scattered areas of fibroglandular density.  FINDINGS: Magnification CC and true lateral views demonstrate a new 6 mm group of coarse heterogeneous calcifications within the upper-outer left breast posterior depth.  Interval increase in size of lobular mass within the 12 o'clock position left breast middle depth.  No additional concerning masses, calcifications or distortion identified within either breast.  Mammographic images were processed with CAD.  On physical exam, no discrete mass is palpated within the superior left breast.  Targeted ultrasound is performed, showing a 3 x 8 x 5 mm solid and cystic mass left breast 12 o'clock position 7 cm from nipple.  No left axillary adenopathy.  IMPRESSION: 1. Indeterminate left breast  calcifications. 2. Indeterminate solid and cystic left breast mass 12 o'clock position.  RECOMMENDATION: 1. Stereotactic guided core needle biopsy indeterminate left breast calcifications. 2. Ultrasound-guided core needle biopsy indeterminate solid and cystic left breast mass 12 o'clock position.  I have discussed the findings and recommendations with the patient. If applicable, a reminder letter will be sent to the patient regarding the next appointment.  BI-RADS CATEGORY 4: Suspicious.   Electronically Signed By: Lindsey [name] M.D. On: 10/15/2019 11:11        Patient with indeterminate left breast mass 12 o'clock position.  EXAM: ULTRASOUND GUIDED LEFT BREAST CORE NEEDLE BIOPSY  COMPARISON: Previous exam(s).  FINDINGS: I met with the patient and we discussed the procedure of ultrasound-guided biopsy, including benefits and alternatives. We discussed the high likelihood of a successful procedure. We discussed the risks of the procedure, including infection, bleeding, tissue injury, clip migration, and inadequate sampling. Informed written consent was given. The usual time-out protocol was performed immediately prior to the procedure.  Lesion quadrant: Upper inner quadrant  Using sterile technique and 1% Lidocaine as local anesthetic, under direct ultrasound visualization, a 12 gauge spring-loaded device was used to perform biopsy of left breast mass 12 o'clock position using a lateral approach. At the conclusion of the procedure ribbon shaped tissue marker clip was deployed into the biopsy cavity. Follow up 2 view mammogram was performed and dictated separately.  IMPRESSION: Ultrasound guided biopsy of left breast mass 12 o'clock position. No apparent complications.   Electronically Signed By: Lindsey [name] M.D. On: 10/17/2019 09       Diagnosis 1. Breast, left, needle core biopsy, 12 o'clock (mass) - FIBROCYSTIC CHANGES WITH APOCRINE  METAPLASIA - NO MALIGNANCY IDENTIFIED 2. Breast,  left, needle core biopsy, upper outer (calcs) - DUCTAL CARCINOMA IN SITU - CALCIFICATIONS - SEE COMMENT Microscopic Comment 2. Based on the biopsy, the ductal carcinoma in situ has a comedo pattern, high nuclear grade and measures 0.4 cm in greatest linear extent. Prognostic markers (ER/PR) are pending and will be reported in an addendum. Lindsey [name] reviewed the case and agrees with the above diagnosis. These results were called to [facility] on October 20, 2019.  The patient is a 53 year old female.   Past Surgical History Lindsey [name], [place]; 10/21/2019 3:57 PM) Breast Biopsy Left. Oral Surgery  Diagnostic Studies History Lindsey [name], [place]; 10/21/2019 3:57 PM) Colonoscopy within last year Mammogram within last year Pap Smear 1-5 years ago  Allergies Lindsey [name], [place]; 10/21/2019 3:58 PM) Azithromycin *CHEMICALS* Tamiflu *ANTIVIRALS* Allergies Reconciled  Medication History Lindsey [name], CMA; 10/21/2019 4:01 PM) Humira Pen (40MG/0.4ML Pen-inj Kit, Subcutaneous) Active. Albuterol Sulfate HFA (108 (90 Base)MCG/ACT Aerosol Soln, Inhalation) Active. amLODIPine Besylate (5MG Tablet, Oral) Active. Desloratadine (5MG Tablet, Oral) Active. Vitamin D (Ergocalciferol) (1.25 MG(50000 UT) Capsule, Oral) Active. Ezetimibe (10MG Tablet, Oral) Active. FLUoxetine HCl (20MG Capsule, Oral) Active. Flovent HFA (110MCG/ACT Aerosol, Inhalation) Active. Levocetirizine Dihydrochloride (5MG Tablet, Oral) Active. Mesalamine (1.2GM Tablet DR, Oral) Active. Montelukast Sodium (10MG Tablet, Oral) Active. Omega-3-acid Ethyl Esters (1GM Capsule, Oral) Active. Vitamin B 12 (50MCG Tablet, Oral) Active. CBD Oil Active. Medications Reconciled  Social History Lindsey [name], [place]; 10/21/2019 3:57 PM) Alcohol use Occasional alcohol use. Caffeine use Coffee. No drug use Tobacco use Former  smoker.  Family History Lindsey [name], [place]; 10/21/2019 3:57 PM) Alcohol Abuse Mother. Depression Mother. Diabetes Mellitus Mother. Melanoma Father.  Pregnancy / Birth History Lindsey [name], [place]; 10/21/2019 3:57 PM) Age at menarche 59 years. Gravida 4 Irregular periods Length (months) of breastfeeding 7-12 Maternal age 72-20 Para 3  Other Problems Lindsey [name], [place]; 10/21/2019 3:57 PM) Anxiety Disorder Asthma Crohn's Disease High blood pressure Hypercholesterolemia Kidney Stone     Review of Systems Lindsey [name] CMA; 10/21/2019 3:57 PM) General Not Present- Appetite Loss, Chills, Fatigue, Fever, Night Sweats, Weight Gain and Weight Loss. Skin Present- Hives. Not Present- Change in Wart/Mole, Dryness, Jaundice, New Lesions, Non-Healing Wounds, Rash and Ulcer. HEENT Not Present- Earache, Hearing Loss, Hoarseness, Nose Bleed, Oral Ulcers, Ringing in the Ears, Seasonal Allergies, Sinus Pain, Sore Throat, Visual Disturbances, Wears glasses/contact lenses and Yellow Eyes. Respiratory Not Present- Bloody sputum, Chronic Cough, Difficulty Breathing, Snoring and Wheezing. Breast Present- Breast Mass. Not Present- Breast Pain, Nipple Discharge and Skin Changes. Cardiovascular Not Present- Chest Pain, Difficulty Breathing Lying Down, Leg Cramps, Palpitations, Rapid Heart Rate, Shortness of Breath and Swelling of Extremities. Gastrointestinal Not Present- Abdominal Pain, Bloating, Bloody Stool, Change in Bowel Habits, Chronic diarrhea, Constipation, Difficulty Swallowing, Excessive gas, Gets full quickly at meals, Hemorrhoids, Indigestion, Nausea, Rectal Pain and Vomiting. Female Genitourinary Not Present- Frequency, Nocturia, Painful Urination, Pelvic Pain and Urgency. Neurological Not Present- Decreased Memory, Fainting, Headaches, Numbness, Seizures, Tingling, Tremor, Trouble walking and Weakness. Psychiatric Present- Anxiety. Not Present- Bipolar, Change in  Sleep Pattern, Depression, Fearful and Frequent crying. Endocrine Not Present- Cold Intolerance, Excessive Hunger, Hair Changes, Heat Intolerance, Hot flashes and New Diabetes. Hematology Not Present- Blood Thinners, Easy Bruising, Excessive bleeding, Gland problems, HIV and Persistent Infections.  Vitals Lindsey [name] CMA; 10/21/2019 3:58 PM) 10/21/2019 3:57 PM Weight: 202.2 lb Height: 66in Body Surface Area: 2.01 m Body Mass Index: 32.64 kg/m  Temp.: 98.61F  Pulse: 108 (Regular)  BP: 144/80 (Sitting,  Left Arm, Standard)        Physical Exam (Jimmi Sidener A. Didier Brandenburg MD; 10/21/2019 4:53 PM)  General Mental Status-Alert. General Appearance-Consistent with stated age. Hydration-Well hydrated. Voice-Normal.  Chest and Lung Exam Note: Workup breathing normal. No shortness of breath. No wheezing.  Breast Note: Bruising left breast upper quadrant noted. No mass. Right breast is normal. No evidence of nipple discharge bilaterally.  Abdomen Note: Soft nontender nondistended  Neurologic Neurologic evaluation reveals -alert and oriented x 3 with no impairment of recent or remote memory. Mental Status-Normal.  Musculoskeletal Normal Exam - Left-Upper Extremity Strength Normal and Lower Extremity Strength Normal. Normal Exam - Right-Upper Extremity Strength Normal and Lower Extremity Strength Normal.  Lymphatic Note: No axillary or supraclavicular adenopathy bilaterally. no Cervical adenopathy.    Assessment & Plan (Roann Merk A. Angelina Neece MD; 10/21/2019 4:55 PM)  BREAST NEOPLASM, TIS (DCIS), LEFT (D05.12) Impression: Discussed breast conservation versus mastectomy and reconstruction. There is a third area of biopsy that is pending for next week once that is done to make further definitive plans. If this is positive, recommend magnetic resonance imaging preoperatively to get a better scope disease. If benign, proceed with left breast lumpectomy. She  would like to conserve her breasts. Discuss adjuvant therapy and radiation therapy rolls. She is on humara for Crohn's disease would recommend keeping this on for surgery to reduce potential complications from that. She understands the potential high risk of bleeding and infection of his medication but I think the benefit of using her Crohns from flaring up outweighs a infection risk. Risk of lumpectomy include bleeding, infection, seroma, more surgery, use of seed/wire, wound care, cosmetic deformity and the need for other treatments, death , blood clots, death. Pt agrees to proceed.  Current Plans You are being scheduled for surgery- Our schedulers will call you.  You should hear from our office's scheduling department within 5 working days about the location, date, and time of surgery. We try to make accommodations for patient's preferences in scheduling surgery, but sometimes the OR schedule or the surgeon's schedule prevents [place] from making those accommodations.  If you have not heard from our office [phone]) in 5 working days, call the office and ask for your surgeon's nurse.  If you have other questions about your diagnosis, plan, or surgery, call the office and ask for your surgeon's nurse.  Pt Education - [facility] breast cancer surgery: discussed with patient and provided information. Pt Education - [facility] Breast Biopsy HCI: discussed with patient and provided information. Pt Education - [facility] Breast Pains Education

## 2019-10-23 ENCOUNTER — Other Ambulatory Visit: Payer: Self-pay | Admitting: Family Medicine

## 2019-10-29 ENCOUNTER — Encounter: Payer: Self-pay | Admitting: Adult Health

## 2019-10-29 DIAGNOSIS — Z170 Estrogen receptor positive status [ER+]: Secondary | ICD-10-CM | POA: Insufficient documentation

## 2019-10-29 DIAGNOSIS — C50412 Malignant neoplasm of upper-outer quadrant of left female breast: Secondary | ICD-10-CM | POA: Insufficient documentation

## 2019-10-30 ENCOUNTER — Ambulatory Visit
Admission: RE | Admit: 2019-10-30 | Discharge: 2019-10-30 | Disposition: A | Discharge status: Home / Self Care | Payer: BC Managed Care – PPO | Source: Ambulatory Visit | Attending: Obstetrics & Gynecology | Admitting: Obstetrics & Gynecology

## 2019-10-30 ENCOUNTER — Other Ambulatory Visit: Payer: Self-pay

## 2019-10-30 DIAGNOSIS — R921 Mammographic calcification found on diagnostic imaging of breast: Secondary | ICD-10-CM

## 2019-10-31 ENCOUNTER — Ambulatory Visit: Payer: Self-pay | Admitting: Surgery

## 2019-11-03 ENCOUNTER — Other Ambulatory Visit: Payer: Self-pay | Admitting: Family Medicine

## 2019-11-03 DIAGNOSIS — F411 Generalized anxiety disorder: Secondary | ICD-10-CM

## 2019-11-04 ENCOUNTER — Other Ambulatory Visit: Payer: Self-pay | Admitting: Surgery

## 2019-11-04 DIAGNOSIS — D0512 Intraductal carcinoma in situ of left breast: Secondary | ICD-10-CM

## 2019-11-06 ENCOUNTER — Telehealth: Payer: Self-pay | Admitting: Oncology

## 2019-11-06 ENCOUNTER — Other Ambulatory Visit: Payer: BC Managed Care – PPO

## 2019-11-06 NOTE — Telephone Encounter (Signed)
Received a new patient breast referral from Dr. Brantley Stage for DCIS. Lindsey [name] has been cld and scheduled to see Dr. Jana Hakim on 12/22 at 4pm w/labs at 3:30pm. She's been made aware to arrive 15 minutes early.

## 2019-11-19 ENCOUNTER — Encounter (HOSPITAL_BASED_OUTPATIENT_CLINIC_OR_DEPARTMENT_OTHER): Payer: Self-pay | Admitting: *Deleted

## 2019-11-19 ENCOUNTER — Other Ambulatory Visit: Payer: Self-pay

## 2019-11-24 ENCOUNTER — Other Ambulatory Visit: Payer: Self-pay | Admitting: Family Medicine

## 2019-11-24 ENCOUNTER — Telehealth: Payer: Self-pay

## 2019-11-24 NOTE — Telephone Encounter (Signed)
PA initiated via Covermymeds; KEY: [id number]. Awaiting determination.

## 2019-11-25 NOTE — Telephone Encounter (Signed)
PA denied for medical necessity. Awaiting denial information.

## 2019-11-25 NOTE — Telephone Encounter (Signed)
Ok to switch to vascepa 1048m 2 po bid  #120  2 refill s

## 2019-11-25 NOTE — Telephone Encounter (Signed)
Preferred alternative is Vascepa.

## 2019-11-26 ENCOUNTER — Ambulatory Visit (INDEPENDENT_AMBULATORY_CARE_PROVIDER_SITE_OTHER): Payer: BC Managed Care – PPO | Admitting: Family Medicine

## 2019-11-26 ENCOUNTER — Other Ambulatory Visit: Payer: Self-pay

## 2019-11-26 ENCOUNTER — Encounter: Payer: Self-pay | Admitting: Family Medicine

## 2019-11-26 VITALS — BP 140/75 | HR 80 | Ht 64.0 in | Wt 193.0 lb

## 2019-11-26 DIAGNOSIS — F411 Generalized anxiety disorder: Secondary | ICD-10-CM

## 2019-11-26 DIAGNOSIS — I10 Essential (primary) hypertension: Secondary | ICD-10-CM

## 2019-11-26 DIAGNOSIS — E785 Hyperlipidemia, unspecified: Secondary | ICD-10-CM | POA: Diagnosis not present

## 2019-11-26 NOTE — Assessment & Plan Note (Signed)
Well controlled, no changes to meds. Encouraged heart healthy diet such as the DASH diet and exercise as tolerated.  °

## 2019-11-26 NOTE — Assessment & Plan Note (Signed)
Stable con't meds 
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
## 2019-11-26 NOTE — Assessment & Plan Note (Signed)
Pt will have labs done after her surgery Tolerating statin, encouraged heart healthy diet, avoid trans fats, minimize simple carbs and saturated fats. Increase exercise as tolerated

## 2019-11-27 MED ORDER — VASCEPA 1 G PO CAPS: 2.0000 g | ORAL_CAPSULE | Freq: Two times a day (BID) | ORAL | 2 refills | Status: DC

## 2019-11-27 NOTE — Telephone Encounter (Signed)
New medication sent

## 2019-11-28 ENCOUNTER — Other Ambulatory Visit: Payer: Self-pay

## 2019-11-28 ENCOUNTER — Other Ambulatory Visit (HOSPITAL_COMMUNITY)
Admission: RE | Admit: 2019-11-28 | Discharge: 2019-11-28 | Disposition: A | Discharge status: Home / Self Care | Payer: BC Managed Care – PPO | Source: Ambulatory Visit | Attending: Surgery | Admitting: Surgery

## 2019-11-28 ENCOUNTER — Encounter (HOSPITAL_BASED_OUTPATIENT_CLINIC_OR_DEPARTMENT_OTHER)
Admission: RE | Admit: 2019-11-28 | Discharge: 2019-11-28 | Disposition: A | Discharge status: Home / Self Care | Payer: BC Managed Care – PPO | Source: Ambulatory Visit | Attending: Surgery | Admitting: Surgery

## 2019-11-28 DIAGNOSIS — Z01818 Encounter for other preprocedural examination: Secondary | ICD-10-CM | POA: Insufficient documentation

## 2019-11-28 DIAGNOSIS — Z20828 Contact with and (suspected) exposure to other viral communicable diseases: Secondary | ICD-10-CM | POA: Insufficient documentation

## 2019-11-28 DIAGNOSIS — D0512 Intraductal carcinoma in situ of left breast: Secondary | ICD-10-CM | POA: Insufficient documentation

## 2019-11-28 LAB — CBC WITH DIFFERENTIAL/PLATELET
Abs Immature Granulocytes: 0.03 10*3/uL (ref 0.00–0.07)
Basophils Absolute: 0.1 10*3/uL (ref 0.0–0.1)
Basophils Relative: 1 %
Eosinophils Absolute: 0.3 10*3/uL (ref 0.0–0.5)
Eosinophils Relative: 3 %
HCT: 43.9 % (ref 36.0–46.0)
Hemoglobin: 14.6 g/dL (ref 12.0–15.0)
Immature Granulocytes: 0 %
Lymphocytes Relative: 35 %
Lymphs Abs: 3 10*3/uL (ref 0.7–4.0)
MCH: 29.6 pg (ref 26.0–34.0)
MCHC: 33.3 g/dL (ref 30.0–36.0)
MCV: 88.9 fL (ref 80.0–100.0)
Monocytes Absolute: 0.6 10*3/uL (ref 0.1–1.0)
Monocytes Relative: 7 %
Neutro Abs: 4.6 10*3/uL (ref 1.7–7.7)
Neutrophils Relative %: 54 %
Platelets: 271 10*3/uL (ref 150–400)
RBC: 4.94 MIL/uL (ref 3.87–5.11)
RDW: 13.2 % (ref 11.5–15.5)
WBC: 8.5 10*3/uL (ref 4.0–10.5)
nRBC: 0 % (ref 0.0–0.2)

## 2019-11-28 LAB — COMPREHENSIVE METABOLIC PANEL
ALT: 32 U/L (ref 0–44)
AST: 26 U/L (ref 15–41)
Albumin: 4 g/dL (ref 3.5–5.0)
Alkaline Phosphatase: 56 U/L (ref 38–126)
Anion gap: 11 (ref 5–15)
BUN: 15 mg/dL (ref 6–20)
CO2: 24 mmol/L (ref 22–32)
Calcium: 9.4 mg/dL (ref 8.9–10.3)
Chloride: 101 mmol/L (ref 98–111)
Creatinine, Ser: 0.53 mg/dL (ref 0.44–1.00)
GFR calc Af Amer: >60 mL/min (ref >60)
GFR calc non Af Amer: >60 mL/min (ref >60)
Glucose, Bld: 150 mg/dL — ABNORMAL HIGH (ref 70–99)
Potassium: 3.4 mmol/L — ABNORMAL LOW (ref 3.5–5.1)
Sodium: 136 mmol/L (ref 135–145)
Total Bilirubin: 0.8 mg/dL (ref 0.3–1.2)
Total Protein: 6.9 g/dL (ref 6.5–8.1)

## 2019-11-28 LAB — SARS CORONAVIRUS 2 (TAT 6-24 HRS): SARS Coronavirus 2: NEGATIVE

## 2019-11-28 NOTE — Progress Notes (Signed)

## 2019-12-01 ENCOUNTER — Other Ambulatory Visit: Payer: Self-pay

## 2019-12-01 ENCOUNTER — Ambulatory Visit
Admission: RE | Admit: 2019-12-01 | Discharge: 2019-12-01 | Disposition: A | Discharge status: Home / Self Care | Payer: BC Managed Care – PPO | Source: Ambulatory Visit | Attending: Surgery | Admitting: Surgery

## 2019-12-01 DIAGNOSIS — D0512 Intraductal carcinoma in situ of left breast: Secondary | ICD-10-CM

## 2019-12-01 NOTE — Anesthesia Preprocedure Evaluation (Addendum)
Anesthesia Evaluation  Patient identified by MRN, date of birth, ID band  Reviewed: Allergy & Precautions, NPO status , Patient's Chart, lab work & pertinent test results  History of Anesthesia Complications (+) PONV  Airway Mallampati: II  TM Distance: >3 FB Neck ROM: Full    Dental no notable dental hx. (+) Teeth Intact, Dental Advisory Given   Pulmonary asthma , former smoker,    Pulmonary exam normal breath sounds clear to auscultation       Cardiovascular hypertension, Pt. on medications Normal cardiovascular exam Rhythm:Regular Rate:Normal     Neuro/Psych negative neurological ROS  negative psych ROS   GI/Hepatic negative GI ROS, Neg liver ROS,   Endo/Other  negative endocrine ROS  Renal/GU      Musculoskeletal negative musculoskeletal ROS (+)   Abdominal (+) + obese,   Peds  Hematology   Anesthesia Other Findings   Reproductive/Obstetrics                            Anesthesia Physical Anesthesia Plan  ASA: II  Anesthesia Plan: General   Post-op Pain Management:    Induction:   PONV Risk Score and Plan: 4 or greater and TIVA, Propofol infusion, Ondansetron, Treatment may vary due to age or medical condition, Dexamethasone, Midazolam and Scopolamine patch - Pre-op  Airway Management Planned: LMA  Additional Equipment: None  Intra-op Plan:   Post-operative Plan:   Informed Consent: I have reviewed the patients History and Physical, chart, labs and discussed the procedure including the risks, benefits and alternatives for the proposed anesthesia with the patient or authorized representative who has indicated his/her understanding and acceptance.     Dental advisory given  Plan Discussed with:   Anesthesia Plan Comments:        Anesthesia Quick Evaluation

## 2019-12-02 ENCOUNTER — Ambulatory Visit (HOSPITAL_BASED_OUTPATIENT_CLINIC_OR_DEPARTMENT_OTHER)
Admission: RE | Admit: 2019-12-02 | Discharge: 2019-12-02 | Disposition: A | Discharge status: Home / Self Care | Payer: BC Managed Care – PPO | Attending: Surgery | Admitting: Surgery

## 2019-12-02 ENCOUNTER — Other Ambulatory Visit: Payer: Self-pay

## 2019-12-02 ENCOUNTER — Encounter (HOSPITAL_BASED_OUTPATIENT_CLINIC_OR_DEPARTMENT_OTHER)
Admission: RE | Disposition: A | Discharge status: Home / Self Care | Payer: Self-pay | Source: Home / Self Care | Attending: Surgery

## 2019-12-02 ENCOUNTER — Ambulatory Visit (HOSPITAL_BASED_OUTPATIENT_CLINIC_OR_DEPARTMENT_OTHER): Payer: BC Managed Care – PPO | Admitting: Anesthesiology

## 2019-12-02 ENCOUNTER — Encounter (HOSPITAL_BASED_OUTPATIENT_CLINIC_OR_DEPARTMENT_OTHER): Payer: Self-pay

## 2019-12-02 ENCOUNTER — Ambulatory Visit
Admission: RE | Admit: 2019-12-02 | Discharge: 2019-12-02 | Disposition: A | Discharge status: Home / Self Care | Payer: BC Managed Care – PPO | Source: Ambulatory Visit | Attending: Surgery | Admitting: Surgery

## 2019-12-02 DIAGNOSIS — Z818 Family history of other mental and behavioral disorders: Secondary | ICD-10-CM | POA: Insufficient documentation

## 2019-12-02 DIAGNOSIS — Z87442 Personal history of urinary calculi: Secondary | ICD-10-CM | POA: Diagnosis not present

## 2019-12-02 DIAGNOSIS — Z79899 Other long term (current) drug therapy: Secondary | ICD-10-CM | POA: Insufficient documentation

## 2019-12-02 DIAGNOSIS — Z833 Family history of diabetes mellitus: Secondary | ICD-10-CM | POA: Insufficient documentation

## 2019-12-02 DIAGNOSIS — E7800 Pure hypercholesterolemia, unspecified: Secondary | ICD-10-CM | POA: Insufficient documentation

## 2019-12-02 DIAGNOSIS — Z887 Allergy status to serum and vaccine status: Secondary | ICD-10-CM | POA: Diagnosis not present

## 2019-12-02 DIAGNOSIS — Z7951 Long term (current) use of inhaled steroids: Secondary | ICD-10-CM | POA: Diagnosis not present

## 2019-12-02 DIAGNOSIS — D0512 Intraductal carcinoma in situ of left breast: Secondary | ICD-10-CM

## 2019-12-02 DIAGNOSIS — K5090 Crohn's disease, unspecified, without complications: Secondary | ICD-10-CM | POA: Insufficient documentation

## 2019-12-02 DIAGNOSIS — Z881 Allergy status to other antibiotic agents status: Secondary | ICD-10-CM | POA: Insufficient documentation

## 2019-12-02 DIAGNOSIS — J45909 Unspecified asthma, uncomplicated: Secondary | ICD-10-CM | POA: Diagnosis not present

## 2019-12-02 DIAGNOSIS — Z888 Allergy status to other drugs, medicaments and biological substances status: Secondary | ICD-10-CM | POA: Insufficient documentation

## 2019-12-02 DIAGNOSIS — Z87891 Personal history of nicotine dependence: Secondary | ICD-10-CM | POA: Diagnosis not present

## 2019-12-02 DIAGNOSIS — Z809 Family history of malignant neoplasm, unspecified: Secondary | ICD-10-CM | POA: Diagnosis not present

## 2019-12-02 DIAGNOSIS — F419 Anxiety disorder, unspecified: Secondary | ICD-10-CM | POA: Diagnosis not present

## 2019-12-02 HISTORY — PX: BREAST LUMPECTOMY: SHX2

## 2019-12-02 HISTORY — DX: Other specified postprocedural states: Z98.890

## 2019-12-02 HISTORY — DX: Other specified postprocedural states: R11.2

## 2019-12-02 HISTORY — PX: BREAST LUMPECTOMY WITH RADIOACTIVE SEED LOCALIZATION: SHX6424

## 2019-12-02 LAB — POCT PREGNANCY, URINE: Preg Test, Ur: NEGATIVE

## 2019-12-02 SURGERY — BREAST LUMPECTOMY WITH RADIOACTIVE SEED LOCALIZATION
Anesthesia: General | Site: Breast | Laterality: Left

## 2019-12-02 MED ORDER — MIDAZOLAM HCL 2 MG/2ML IJ SOLN
INTRAMUSCULAR | Status: DC | PRN
  Administered 2019-12-02: 2 mg via INTRAVENOUS

## 2019-12-02 MED ORDER — CHLORHEXIDINE GLUCONATE CLOTH 2 % EX PADS: 6.0000 | MEDICATED_PAD | Freq: Once | CUTANEOUS | Status: DC

## 2019-12-02 MED ORDER — ONDANSETRON HCL 4 MG/2ML IJ SOLN
INTRAMUSCULAR | Status: DC | PRN
  Administered 2019-12-02: 4 mg via INTRAVENOUS

## 2019-12-02 MED ORDER — PROPOFOL 10 MG/ML IV BOLUS
INTRAVENOUS | Status: AC
  Filled 2019-12-02: qty 40

## 2019-12-02 MED ORDER — IBUPROFEN 800 MG PO TABS: 800.0000 mg | ORAL_TABLET | Freq: Three times a day (TID) | ORAL | 0 refills | Status: DC | PRN

## 2019-12-02 MED ORDER — MIDAZOLAM HCL 2 MG/2ML IJ SOLN
INTRAMUSCULAR | Status: AC
  Filled 2019-12-02: qty 2

## 2019-12-02 MED ORDER — LIDOCAINE 2% (20 MG/ML) 5 ML SYRINGE
INTRAMUSCULAR | Status: AC
  Filled 2019-12-02: qty 5

## 2019-12-02 MED ORDER — GABAPENTIN 300 MG PO CAPS
ORAL_CAPSULE | ORAL | Status: AC
  Filled 2019-12-02: qty 1

## 2019-12-02 MED ORDER — EPHEDRINE 5 MG/ML INJ
INTRAVENOUS | Status: AC
  Filled 2019-12-02: qty 10

## 2019-12-02 MED ORDER — SCOPOLAMINE 1 MG/3DAYS TD PT72
MEDICATED_PATCH | TRANSDERMAL | Status: AC
  Filled 2019-12-02: qty 1

## 2019-12-02 MED ORDER — CELECOXIB 200 MG PO CAPS
200.0000 mg | ORAL_CAPSULE | ORAL | Status: AC
  Administered 2019-12-02: 09:00:00 200 mg via ORAL

## 2019-12-02 MED ORDER — ACETAMINOPHEN 500 MG PO TABS
1000.0000 mg | ORAL_TABLET | ORAL | Status: AC
  Administered 2019-12-02: 1000 mg via ORAL

## 2019-12-02 MED ORDER — ONDANSETRON HCL 4 MG/2ML IJ SOLN
INTRAMUSCULAR | Status: AC
  Filled 2019-12-02: qty 2

## 2019-12-02 MED ORDER — LACTATED RINGERS IV SOLN
INTRAVENOUS | Status: DC
  Administered 2019-12-02: 09:00:00 via INTRAVENOUS

## 2019-12-02 MED ORDER — EPHEDRINE SULFATE-NACL 50-0.9 MG/10ML-% IV SOSY
PREFILLED_SYRINGE | INTRAVENOUS | Status: DC | PRN
  Administered 2019-12-02 (×3): 5 mg via INTRAVENOUS

## 2019-12-02 MED ORDER — FENTANYL CITRATE (PF) 100 MCG/2ML IJ SOLN
INTRAMUSCULAR | Status: DC | PRN
  Administered 2019-12-02 (×2): 25 ug via INTRAVENOUS
  Administered 2019-12-02: 50 ug via INTRAVENOUS

## 2019-12-02 MED ORDER — MIDAZOLAM HCL 2 MG/2ML IJ SOLN: 1.0000 mg | INTRAMUSCULAR | Status: DC | PRN

## 2019-12-02 MED ORDER — GABAPENTIN 300 MG PO CAPS
300.0000 mg | ORAL_CAPSULE | ORAL | Status: AC
  Administered 2019-12-02: 09:00:00 300 mg via ORAL

## 2019-12-02 MED ORDER — BUPIVACAINE HCL (PF) 0.25 % IJ SOLN
INTRAMUSCULAR | Status: DC | PRN
  Administered 2019-12-02: 20 mL

## 2019-12-02 MED ORDER — OXYCODONE HCL 5 MG PO TABS: 5.0000 mg | ORAL_TABLET | Freq: Four times a day (QID) | ORAL | 0 refills | Status: DC | PRN

## 2019-12-02 MED ORDER — OXYCODONE HCL 5 MG/5ML PO SOLN: 5.0000 mg | Freq: Once | ORAL | Status: DC | PRN

## 2019-12-02 MED ORDER — LIDOCAINE HCL (CARDIAC) PF 100 MG/5ML IV SOSY
PREFILLED_SYRINGE | INTRAVENOUS | Status: DC | PRN
  Administered 2019-12-02: 100 mg via INTRAVENOUS

## 2019-12-02 MED ORDER — FENTANYL CITRATE (PF) 100 MCG/2ML IJ SOLN
INTRAMUSCULAR | Status: AC
  Filled 2019-12-02: qty 2

## 2019-12-02 MED ORDER — PROPOFOL 10 MG/ML IV BOLUS
INTRAVENOUS | Status: DC | PRN
  Administered 2019-12-02: 100 mg via INTRAVENOUS

## 2019-12-02 MED ORDER — CELECOXIB 200 MG PO CAPS
ORAL_CAPSULE | ORAL | Status: AC
  Filled 2019-12-02: qty 1

## 2019-12-02 MED ORDER — FENTANYL CITRATE (PF) 100 MCG/2ML IJ SOLN: 50.0000 ug | INTRAMUSCULAR | Status: DC | PRN

## 2019-12-02 MED ORDER — ACETAMINOPHEN 500 MG PO TABS
ORAL_TABLET | ORAL | Status: AC
  Filled 2019-12-02: qty 2

## 2019-12-02 MED ORDER — CEFAZOLIN SODIUM-DEXTROSE 2-4 GM/100ML-% IV SOLN
2.0000 g | INTRAVENOUS | Status: AC
  Administered 2019-12-02: 2 g via INTRAVENOUS

## 2019-12-02 MED ORDER — SCOPOLAMINE 1 MG/3DAYS TD PT72
1.0000 | MEDICATED_PATCH | Freq: Once | TRANSDERMAL | Status: DC
  Administered 2019-12-02: 1.5 mg via TRANSDERMAL

## 2019-12-02 MED ORDER — FENTANYL CITRATE (PF) 100 MCG/2ML IJ SOLN: 25.0000 ug | INTRAMUSCULAR | Status: DC | PRN

## 2019-12-02 MED ORDER — OXYCODONE HCL 5 MG PO TABS: 5.0000 mg | ORAL_TABLET | Freq: Once | ORAL | Status: DC | PRN

## 2019-12-02 MED ORDER — CEFAZOLIN SODIUM-DEXTROSE 2-4 GM/100ML-% IV SOLN
INTRAVENOUS | Status: AC
  Filled 2019-12-02: qty 100

## 2019-12-02 MED ORDER — ONDANSETRON HCL 4 MG/2ML IJ SOLN: 4.0000 mg | Freq: Once | INTRAMUSCULAR | Status: DC | PRN

## 2019-12-02 MED ORDER — DEXAMETHASONE SODIUM PHOSPHATE 10 MG/ML IJ SOLN
INTRAMUSCULAR | Status: DC | PRN
  Administered 2019-12-02: 4 mg via INTRAVENOUS

## 2019-12-02 SURGICAL SUPPLY — 50 items
ADH SKN CLS APL DERMABOND .7 (GAUZE/BANDAGES/DRESSINGS) ×1
APL PRP STRL LF DISP 70% ISPRP (MISCELLANEOUS) ×1
APPLIER CLIP 9.375 MED OPEN (MISCELLANEOUS) ×2
APR CLP MED 9.3 20 MLT OPN (MISCELLANEOUS) ×1
BINDER BREAST LRG (GAUZE/BANDAGES/DRESSINGS) IMPLANT
BINDER BREAST XLRG (GAUZE/BANDAGES/DRESSINGS) IMPLANT
BINDER BREAST XXLRG (GAUZE/BANDAGES/DRESSINGS) ×1 IMPLANT
BLADE SURG 15 STRL LF DISP TIS (BLADE) ×1 IMPLANT
BLADE SURG 15 STRL SS (BLADE) ×2
CANISTER SUC SOCK COL 7IN (MISCELLANEOUS) IMPLANT
CANISTER SUCT 1200ML W/VALVE (MISCELLANEOUS) IMPLANT
CHLORAPREP W/TINT 26 (MISCELLANEOUS) ×2 IMPLANT
CLIP APPLIE 9.375 MED OPEN (MISCELLANEOUS) IMPLANT
COVER BACK TABLE REUSABLE LG (DRAPES) ×2 IMPLANT
COVER MAYO STAND REUSABLE (DRAPES) ×2 IMPLANT
COVER PROBE W GEL 5X96 (DRAPES) ×2 IMPLANT
DECANTER SPIKE VIAL GLASS SM (MISCELLANEOUS) IMPLANT
DERMABOND ADVANCED (GAUZE/BANDAGES/DRESSINGS) ×1
DERMABOND ADVANCED .7 DNX12 (GAUZE/BANDAGES/DRESSINGS) ×1 IMPLANT
DRAPE LAPAROTOMY 100X72 PEDS (DRAPES) ×2 IMPLANT
DRAPE UTILITY XL STRL (DRAPES) ×2 IMPLANT
ELECT COATED BLADE 2.86 ST (ELECTRODE) ×2 IMPLANT
ELECT REM PT RETURN 9FT ADLT (ELECTROSURGICAL) ×2
ELECTRODE REM PT RTRN 9FT ADLT (ELECTROSURGICAL) ×1 IMPLANT
GLOVE BIO SURGEON STRL SZ 6.5 (GLOVE) ×1 IMPLANT
GLOVE BIOGEL PI IND STRL 7.0 (GLOVE) IMPLANT
GLOVE BIOGEL PI IND STRL 8 (GLOVE) ×1 IMPLANT
GLOVE BIOGEL PI INDICATOR 7.0 (GLOVE) ×1
GLOVE BIOGEL PI INDICATOR 8 (GLOVE) ×1
GLOVE ECLIPSE 8.0 STRL XLNG CF (GLOVE) ×2 IMPLANT
GOWN STRL REUS W/ TWL LRG LVL3 (GOWN DISPOSABLE) ×2 IMPLANT
GOWN STRL REUS W/TWL LRG LVL3 (GOWN DISPOSABLE) ×4
HEMOSTAT ARISTA ABSORB 3G PWDR (HEMOSTASIS) IMPLANT
HEMOSTAT SNOW SURGICEL 2X4 (HEMOSTASIS) IMPLANT
KIT MARKER MARGIN INK (KITS) ×2 IMPLANT
NDL HYPO 25X1 1.5 SAFETY (NEEDLE) ×1 IMPLANT
NEEDLE HYPO 25X1 1.5 SAFETY (NEEDLE) ×2 IMPLANT
NS IRRIG 1000ML POUR BTL (IV SOLUTION) ×2 IMPLANT
PACK BASIN DAY SURGERY FS (CUSTOM PROCEDURE TRAY) ×2 IMPLANT
PENCIL SMOKE EVACUATOR (MISCELLANEOUS) ×2 IMPLANT
SLEEVE SCD COMPRESS KNEE MED (MISCELLANEOUS) ×2 IMPLANT
SPONGE LAP 4X18 RFD (DISPOSABLE) ×2 IMPLANT
SUT MNCRL AB 4-0 PS2 18 (SUTURE) ×2 IMPLANT
SUT SILK 2 0 SH (SUTURE) IMPLANT
SUT VICRYL 3-0 CR8 SH (SUTURE) ×2 IMPLANT
SYR CONTROL 10ML LL (SYRINGE) ×2 IMPLANT
TOWEL GREEN STERILE FF (TOWEL DISPOSABLE) ×2 IMPLANT
TRAY FAXITRON CT DISP (TRAY / TRAY PROCEDURE) ×2 IMPLANT
TUBE CONNECTING 20X1/4 (TUBING) ×1 IMPLANT
YANKAUER SUCT BULB TIP NO VENT (SUCTIONS) ×1 IMPLANT

## 2019-12-02 NOTE — H&P (Signed)
Lindsey [name]  Location: [facility] Patient #: [id number] DOB: 1966-04-14 Married / Language: English / Race: White Female  History of Present Illness  Patient words: Patient sent at the request of Dr. Rosana [name] of the [facility] for abnormal left breast mammogram. An area in the left breast upper quadrant was being followed with short-term follow-up secondary to microcalcifications. 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 She had a area biopsied adjacent to that which was cystic and came back as fibrocystic change at around 12:00. This corresponded to the mass. A third area biopsied it is going to be done next week in the left breast. No history of breast pain, nipple discharge or family history. She is sore from her biopsy and bruised.      Patient for short-term follow-up probably benign left breast mass.  EXAM: DIGITAL DIAGNOSTIC BILATERAL MAMMOGRAM WITH CAD AND TOMO  ULTRASOUND LEFT BREAST  COMPARISON: Previous exam(s).  ACR Breast Density Category b: There are scattered areas of fibroglandular density.  FINDINGS: Magnification CC and true lateral views demonstrate a new 6 mm group of coarse heterogeneous calcifications within the upper-outer left breast posterior depth.  Interval increase in size of lobular mass within the 12 o'clock position left breast middle depth.  No additional concerning masses, calcifications or distortion identified within either breast.  Mammographic images were processed with CAD.  On physical exam, no discrete mass is palpated within the superior left breast.  Targeted ultrasound is performed, showing a 3 x 8 x 5 mm solid and cystic mass left breast 12 o'clock position 7 cm from nipple.  No left axillary adenopathy.  IMPRESSION: 1. Indeterminate left breast calcifications. 2. Indeterminate solid and cystic left breast mass 12  o'clock position.  RECOMMENDATION: 1. Stereotactic guided core needle biopsy indeterminate left breast calcifications. 2. Ultrasound-guided core needle biopsy indeterminate solid and cystic left breast mass 12 o'clock position.  I have discussed the findings and recommendations with the patient. If applicable, a reminder letter will be sent to the patient regarding the next appointment.  BI-RADS CATEGORY 4: Suspicious.   Electronically Signed By: Lindsey [name] M.D. On: 10/15/2019 11:11        Patient with indeterminate left breast mass 12 o'clock position.  EXAM: ULTRASOUND GUIDED LEFT BREAST CORE NEEDLE BIOPSY  COMPARISON: Previous exam(s).  FINDINGS: I met with the patient and we discussed the procedure of ultrasound-guided biopsy, including benefits and alternatives. We discussed the high likelihood of a successful procedure. We discussed the risks of the procedure, including infection, bleeding, tissue injury, clip migration, and inadequate sampling. Informed written consent was given. The usual time-out protocol was performed immediately prior to the procedure.  Lesion quadrant: Upper inner quadrant  Using sterile technique and 1% Lidocaine as local anesthetic, under direct ultrasound visualization, a 12 gauge spring-loaded device was used to perform biopsy of left breast mass 12 o'clock position using a lateral approach. At the conclusion of the procedure ribbon shaped tissue marker clip was deployed into the biopsy cavity. Follow up 2 view mammogram was performed and dictated separately.  IMPRESSION: Ultrasound guided biopsy of left breast mass 12 o'clock position. No apparent complications.   Electronically Signed By: Lindsey [name] M.D. On: 10/17/2019 09       Diagnosis 1. Breast, left, needle core biopsy, 12 o'clock (mass) - FIBROCYSTIC CHANGES WITH APOCRINE METAPLASIA - NO MALIGNANCY IDENTIFIED 2. Breast,  left, needle core biopsy, upper outer (calcs) - DUCTAL  CARCINOMA IN SITU - CALCIFICATIONS - SEE COMMENT Microscopic Comment 2. Based on the biopsy, the ductal carcinoma in situ has a comedo pattern, high nuclear grade and measures 0.4 cm in greatest linear extent. Prognostic markers (ER/PR) are pending and will be reported in an addendum. Dr. Vic [name] reviewed the case and agrees with the above diagnosis. These results were called to [facility] on October 20, 2019.  The patient is a 53 year old female.   Past Surgical History  Breast Biopsy Left. Oral Surgery  Diagnostic Studies History Colonoscopy within last year Mammogram within last year Pap Smear 1-5 years ago  Allergies  Azithromycin *CHEMICALS* Tamiflu *ANTIVIRALS* Allergies Reconciled  Medication History  Humira Pen (40MG/0.4ML Pen-inj Kit, Subcutaneous) Active. Albuterol Sulfate HFA (108 (90 Base)MCG/ACT Aerosol Soln, Inhalation) Active. amLODIPine Besylate (5MG Tablet, Oral) Active. Desloratadine (5MG Tablet, Oral) Active. Vitamin D (Ergocalciferol) (1.25 MG(50000 UT) Capsule, Oral) Active. Ezetimibe (10MG Tablet, Oral) Active. FLUoxetine HCl (20MG Capsule, Oral) Active. Flovent HFA (110MCG/ACT Aerosol, Inhalation) Active. Levocetirizine Dihydrochloride (5MG Tablet, Oral) Active. Mesalamine (1.2GM Tablet DR, Oral) Active. Montelukast Sodium (10MG Tablet, Oral) Active. Omega-3-acid Ethyl Esters (1GM Capsule, Oral) Active. Vitamin B 12 (50MCG Tablet, Oral) Active. CBD Oil Active. Medications Reconciled  Social History Alcohol use Occasional alcohol use. Caffeine use Coffee. No drug use Tobacco use Former smoker.  Family HistoryAlcohol Abuse Mother. Depression Mother. Diabetes Mellitus Mother. Melanoma Father.  Pregnancy / Birth HistoryAge at menarche 15 years. Gravida 4 Irregular periods Length (months) of breastfeeding 7-12 Maternal  age 43-20 Para 3  Other Problems Lindsey [name], Lindsey [name]; 10/21/2019 3:57 PM) Anxiety Disorder Asthma Crohn's Disease High blood pressure Hypercholesterolemia Kidney Stone     Review of Systems  General Not Present- Appetite Loss, Chills, Fatigue, Fever, Night Sweats, Weight Gain and Weight Loss. Skin Present- Hives. Not Present- Change in Wart/Mole, Dryness, Jaundice, New Lesions, Non-Healing Wounds, Rash and Ulcer. HEENT Not Present- Earache, Hearing Loss, Hoarseness, Nose Bleed, Oral Ulcers, Ringing in the Ears, Seasonal Allergies, Sinus Pain, Sore Throat, Visual Disturbances, Wears glasses/contact lenses and Yellow Eyes. Respiratory Not Present- Bloody sputum, Chronic Cough, Difficulty Breathing, Snoring and Wheezing. Breast Present- Breast Mass. Not Present- Breast Pain, Nipple Discharge and Skin Changes. Cardiovascular Not Present- Chest Pain, Difficulty Breathing Lying Down, Leg Cramps, Palpitations, Rapid Heart Rate, Shortness of Breath and Swelling of Extremities. Gastrointestinal Not Present- Abdominal Pain, Bloating, Bloody Stool, Change in Bowel Habits, Chronic diarrhea, Constipation, Difficulty Swallowing, Excessive gas, Gets full quickly at meals, Hemorrhoids, Indigestion, Nausea, Rectal Pain and Vomiting. Female Genitourinary Not Present- Frequency, Nocturia, Painful Urination, Pelvic Pain and Urgency. Neurological Not Present- Decreased Memory, Fainting, Headaches, Numbness, Seizures, Tingling, Tremor, Trouble walking and Weakness. Psychiatric Present- Anxiety. Not Present- Bipolar, Change in Sleep Pattern, Depression, Fearful and Frequent crying. Endocrine Not Present- Cold Intolerance, Excessive Hunger, Hair Changes, Heat Intolerance, Hot flashes and New Diabetes. Hematology Not Present- Blood Thinners, Easy Bruising, Excessive bleeding, Gland problems, HIV and Persistent Infections.  Vitals 10/21/2019 3:57 PM Weight: 202.2 lb Height: 66in Body Surface  Area: 2.01 m Body Mass Index: 32.64 kg/m  Temp.: 98.33F  Pulse: 108 (Regular)  BP: 144/80 (Sitting, Left Arm, Standard)        Physical Exam  General Mental Status-Alert. General Appearance-Consistent with stated age. Hydration-Well hydrated. Voice-Normal.  Chest and Lung Exam Note: Workup breathing normal. No shortness of breath. No wheezing.  Breast Note: Bruising left breast upper quadrant noted. No mass. Right breast is normal. No evidence of nipple discharge bilaterally.  Abdomen Note: Soft nontender nondistended  Neurologic Neurologic evaluation reveals -alert and oriented x 3 with no impairment of recent or remote memory. Mental Status-Normal.  Musculoskeletal Normal Exam - Left-Upper Extremity Strength Normal and Lower Extremity Strength Normal. Normal Exam - Right-Upper Extremity Strength Normal and Lower Extremity Strength Normal.  Lymphatic Note: No axillary or supraclavicular adenopathy bilaterally. no Cervical adenopathy.    Assessment & Plan   BREAST NEOPLASM, TIS (DCIS), LEFT (D05.12) Impression: Discussed breast conservation versus mastectomy and reconstruction. There is a third area of biopsy that is pending for next week once that is done to make further definitive plans. If this is positive, recommend magnetic resonance imaging preoperatively to get a better scope disease. If benign, proceed with left breast lumpectomy. She would like to conserve her breasts. Discuss adjuvant therapy and radiation therapy rolls. She is on humara for Crohn's disease would recommend keeping this on for surgery to reduce potential complications from that. She understands the potential high risk of bleeding and infection of his medication but I think the benefit of using her Crohns from flaring up outweighs a infection risk. Risk of lumpectomy include bleeding, infection, seroma, more surgery, use of seed/wire, wound care,  cosmetic deformity and the need for other treatments, death , blood clots, death. Pt agrees to proceed.  Current Plans You are being scheduled for surgery- Our schedulers will call you.  You should hear from our office's scheduling department within 5 working days about the location, date, and time of surgery. We try to make accommodations for patient's preferences in scheduling surgery, but sometimes the OR schedule or the surgeon's schedule prevents [place] from making those accommodations.  If you have not heard from our office [phone] in 5 working days, call the office and ask for your surgeon's nurse.  If you have other questions about your diagnosis, plan, or surgery, call the office and ask for your surgeon's nurse.  Pt Education - [facility] breast cancer surgery: discussed with patient and provided information. Pt Education - [facility] Breast Biopsy HCI: discussed with patient and provided information. Pt Education - [facility] Breast Pains Education

## 2019-12-02 NOTE — Anesthesia Procedure Notes (Signed)
Procedure Name: LMA Insertion Date/Time: 12/02/2019 9:35 AM Performed by: Raenette Rover, CRNA Pre-anesthesia Checklist: Patient identified, Emergency Drugs available, Suction available and Patient being monitored Patient Re-evaluated:Patient Re-evaluated prior to induction Oxygen Delivery Method: Circle system utilized Preoxygenation: Pre-oxygenation with 100% oxygen Induction Type: IV induction LMA: LMA inserted LMA Size: 4.0 Number of attempts: 1 Placement Confirmation: positive ETCO2 and breath sounds checked- equal and bilateral Tube secured with: Tape Dental Injury: Teeth and Oropharynx as per pre-operative assessment

## 2019-12-02 NOTE — Anesthesia Postprocedure Evaluation (Signed)
Anesthesia Post Note  Patient: Lindsey [name]  Procedure(s) Performed: LEFT BREAST LUMPECTOMY WITH RADIOACTIVE SEED LOCALIZATION (Left Breast)     Patient location during evaluation: PACU Anesthesia Type: General Level of consciousness: awake and alert Pain management: pain level controlled Vital Signs Assessment: post-procedure vital signs reviewed and stable Respiratory status: spontaneous breathing, nonlabored ventilation, respiratory function stable and patient connected to nasal cannula oxygen Cardiovascular status: blood pressure returned to baseline and stable Postop Assessment: no apparent nausea or vomiting Anesthetic complications: no    Last Vitals:  Vitals:   12/02/19 1100 12/02/19 1129  BP: 111/61 122/72  Pulse:  76  Resp: 16 20  Temp:  36.6 C  SpO2:  96%    Last Pain:  Vitals:   12/02/19 1129  TempSrc: Oral  PainSc: 0-No pain                 Barnet Glasgow

## 2019-12-02 NOTE — Op Note (Signed)
Peoperative diagnosis: Left breast DCIS upper outer quadrant   Postoperative diagnosis: Same   Procedure: Left breast seed localized lumpectomy  Surgeon: Erroll Luna M.D.  Anesthesia: Gen. With 0.25% Sensorcaine local  EBL: 20 cc  Specimen: Left breast tissue with clip and radioactive seed in the specimen. Verified with neoprobe and radiographic image showing both seed and clip in specimen plus additional media margin   Indications for procedure: The patient presents for left breast seed  lumpectomy after core biopsy showed DCIS.  She opted for lumpectomy after discussion of breast conserving surgery and mastectomy with reconstruction.  The procedure has been discussed with the patient. Alternatives to surgery have been discussed with the patient.  Risks of surgery include bleeding,  Infection,  Seroma formation, death,  and the need for further surgery.   The patient understands and wishes to proceed.  Description of procedure: Patient underwent seed placement as an outpatient. Patient presents today for left breast seed localized lumpectomy. Patient seen in  holding area. Questions are answered and neoprobe used to verify seed location. Patient taken back to the operating room and placed  Supine upon the OR table. After induction of general anesthesia, left breast prepped and draped in a sterile fashion. Timeout was done to verify proper side and  procedure. Neoprobe used and hot spot identified and left breast upper-outer quadrant. This was marked with pen. Curvilinear incision made left upper outer quadrant breast. Dissection done  with the help of a neoprobe around the tissue where the seed and clip were located. Tissue removed in its entirety with gross  Negative margins.. Neoprobe used and seed within specimen. Radiographs taken which show clip and seed.  Medial margin was close so additional tissue removed. All margins were inked. Cavity was clipped. Hemostasis achieved and cavity closed  with 3-0 Vicryl and 4-0 Monocryl. Dermabond applied. All final counts found to be correct. Specimen transported to pathology. Patient awoke extubated taken to recovery in satisfactory condition.

## 2019-12-02 NOTE — Discharge Instructions (Signed)
[place] Office Phone Number [phone]  BREAST BIOPSY/ PARTIAL MASTECTOMY: POST OP INSTRUCTIONS  Always review your discharge instruction sheet given to you by the facility where your surgery was performed.  IF YOU HAVE DISABILITY OR FAMILY LEAVE FORMS, YOU MUST BRING THEM TO THE OFFICE FOR PROCESSING.  DO NOT GIVE THEM TO YOUR DOCTOR.  1. A prescription for pain medication may be given to you upon discharge.  Take your pain medication as prescribed, if needed.  If narcotic pain medicine is not needed, then you may take acetaminophen (Tylenol) or ibuprofen (Advil) as needed. 2. Take your usually prescribed medications unless otherwise directed 3. If you need a refill on your pain medication, please contact your pharmacy.  They will contact our office to request authorization.  Prescriptions will not be filled after 5pm or on week-ends. 4. You should eat very light the first 24 hours after surgery, such as soup, crackers, pudding, etc.  Resume your normal diet the day after surgery. 5. Most patients will experience some swelling and bruising in the breast.  Ice packs and a good support bra will help.  Swelling and bruising can take several days to resolve.  6. It is common to experience some constipation if taking pain medication after surgery.  Increasing fluid intake and taking a stool softener will usually help or prevent this problem from occurring.  A mild laxative (Milk of Magnesia or Miralax) should be taken according to package directions if there are no bowel movements after 48 hours. 7. Unless discharge instructions indicate otherwise, you may remove your bandages 24-48 hours after surgery, and you may shower at that time.  You may have steri-strips (small skin tapes) in place directly over the incision.  These strips should be left on the skin for 7-10 days.  If your surgeon used skin glue on the incision, you may shower in 24 hours.  The glue will flake off over the  next 2-3 weeks.  Any sutures or staples will be removed at the office during your follow-up visit. 8. ACTIVITIES:  You may resume regular daily activities (gradually increasing) beginning the next day.  Wearing a good support bra or sports bra minimizes pain and swelling.  You may have sexual intercourse when it is comfortable. a. You may drive when you no longer are taking prescription pain medication, you can comfortably wear a seatbelt, and you can safely maneuver your car and apply brakes. b. RETURN TO WORK:  ______________________________________________________________________________________ 9. You should see your doctor in the office for a follow-up appointment approximately two weeks after your surgery.  Your doctors nurse will typically make your follow-up appointment when she calls you with your pathology report.  Expect your pathology report 2-3 business days after your surgery.  You may call to check if you do not hear from [place] after three days. 10. OTHER INSTRUCTIONS: _______________________________________________________________________________________________ _____________________________________________________________________________________________________________________________________ _____________________________________________________________________________________________________________________________________ _____________________________________________________________________________________________________________________________________  WHEN TO CALL YOUR DOCTOR: 1. Fever over 101.0 2. Nausea and/or vomiting. 3. Extreme swelling or bruising. 4. Continued bleeding from incision. 5. Increased pain, redness, or drainage from the incision.  The clinic staff is available to answer your questions during regular business hours.  Please dont hesitate to call and ask to speak to one of the nurses for clinical concerns.  If you have a medical emergency, go to the nearest  emergency room or call 911.  A surgeon from [facility] is always on call at the hospital.  For further questions, please visit [email or web address]  No tylenol until after 2:30 today  No ibuprofen until after 4:30 today    Post Anesthesia Home Care Instructions  Activity: Get plenty of rest for the remainder of the day. A responsible individual must stay with you for 24 hours following the procedure.  For the next 24 hours, DO NOT: -Drive a car -Paediatric nurse -Drink alcoholic beverages -Take any medication unless instructed by your physician -Make any legal decisions or sign important papers.  Meals: Start with liquid foods such as gelatin or soup. Progress to regular foods as tolerated. Avoid greasy, spicy, heavy foods. If nausea and/or vomiting occur, drink only clear liquids until the nausea and/or vomiting subsides. Call your physician if vomiting continues.  Special Instructions/Symptoms: Your throat may feel dry or sore from the anesthesia or the breathing tube placed in your throat during surgery. If this causes discomfort, gargle with warm salt water. The discomfort should disappear within 24 hours.  If you had a scopolamine patch placed behind your ear for the management of post- operative nausea and/or vomiting:  1. The medication in the patch is effective for 72 hours, after which it should be removed.  Wrap patch in a tissue and discard in the trash. Wash hands thoroughly with soap and water. 2. You may remove the patch earlier than 72 hours if you experience unpleasant side effects which may include dry mouth, dizziness or visual disturbances. 3. Avoid touching the patch. Wash your hands with soap and water after contact with the patch.      JP Drain [employer] this sheet to all of your post-operative appointments while you have your drains.  Please measure your drains by CC's or ML's.  Make sure you drain and measure your JP  Drains 2 or 3 times per day.  At the end of each day, add up totals for the left side and add up totals for the right side.    ( 9 am )     ( 3 pm )        ( 9 pm )                Date L  R  L  R  L  R  Total L/R

## 2019-12-02 NOTE — Interval H&P Note (Signed)
History and Physical Interval Note:  12/02/2019 9:12 AM  Lindsey [name]  has presented today for surgery, with the diagnosis of LEFT DCIS.  The various methods of treatment have been discussed with the patient and family. After consideration of risks, benefits and other options for treatment, the patient has consented to  Procedure(s): LEFT BREAST LUMPECTOMY WITH RADIOACTIVE SEED LOCALIZATION (Left) as a surgical intervention.  The patient's history has been reviewed, patient examined, no change in status, stable for surgery.  I have reviewed the patient's chart and labs.  Questions were answered to the patient's satisfaction.     [place]

## 2019-12-02 NOTE — Transfer of Care (Signed)
Immediate Anesthesia Transfer of Care Note  Patient: Lindsey [name]  Procedure(s) Performed: LEFT BREAST LUMPECTOMY WITH RADIOACTIVE SEED LOCALIZATION (Left Breast)  Patient Location: PACU  Anesthesia Type:General  Level of Consciousness: awake, drowsy and patient cooperative  Airway & Oxygen Therapy: Patient Spontanous Breathing and Patient connected to nasal cannula oxygen  Post-op Assessment: Report given to RN and Post -op Vital signs reviewed and stable  Post vital signs: Reviewed and stable  Last Vitals:  Vitals Value Taken Time  BP 119/66 12/02/19 1012  Temp    Pulse 81 12/02/19 1014  Resp 17 12/02/19 1014  SpO2 98 % 12/02/19 1014  Vitals shown include unvalidated device data.  Last Pain:  Vitals:   12/02/19 0826  TempSrc: Oral  PainSc: 0-No pain      Patients Stated Pain Goal: 8 (25/05/39 [id number])  Complications: No apparent anesthesia complications

## 2019-12-03 ENCOUNTER — Encounter (HOSPITAL_BASED_OUTPATIENT_CLINIC_OR_DEPARTMENT_OTHER): Payer: Self-pay | Admitting: Surgery

## 2019-12-03 ENCOUNTER — Other Ambulatory Visit: Payer: Self-pay

## 2019-12-05 LAB — SURGICAL PATHOLOGY

## 2019-12-08 ENCOUNTER — Ambulatory Visit: Payer: Self-pay | Admitting: Surgery

## 2019-12-15 ENCOUNTER — Encounter (HOSPITAL_BASED_OUTPATIENT_CLINIC_OR_DEPARTMENT_OTHER): Payer: Self-pay | Admitting: Surgery

## 2019-12-15 ENCOUNTER — Other Ambulatory Visit: Payer: Self-pay

## 2019-12-15 ENCOUNTER — Other Ambulatory Visit: Payer: Self-pay | Admitting: *Deleted

## 2019-12-15 NOTE — Progress Notes (Signed)
[place]  [phone] [phone]) [phone]     ID: Merrick Feutz DOB: [birth year]  MR#: [id number]  DHW#:[id number]  Patient Care Team: Carollee Herter, Alferd Apa, DO as PCP - General Maisie Fus, MD as Consulting Physician (Obstetrics and Gynecology) Bobbitt, Sedalia Muta, MD as Consulting Physician (Allergy and Immunology) Erroll Luna, MD as Consulting Physician (General Surgery) Letica Giaimo, Virgie Dad, MD as Consulting Physician (Oncology) Pyrtle, Lajuan Lines, MD as Consulting Physician (Gastroenterology) Pamala Hurry, MD as Consulting Physician (Urology) Mauro Kaufmann, RN as Oncology Nurse Navigator Rockwell Germany, RN as Oncology Nurse Navigator Eppie Gibson, MD as Attending Physician (Radiation Oncology) Angelina Ok, MD as Referring Physician (Surgery) Chauncey Cruel, MD OTHER MD:  CHIEF COMPLAINT: noninvasive breast cancer, estrogen receptor positive  CURRENT TREATMENT: pending re-excision   HISTORY OF CURRENT ILLNESS: Lanore Renderos had routine screening mammography on 10/08/2017 showing a possible abnormality in the left breast. She underwent left diagnostic mammography with tomography and left breast ultrasonography at [facility] on 10/17/2017 showing: breast density category B; 9 mm indeterminate left breast mass without sonographic correlate. She was scheduled to undergo biopsy of the left breast mass on 10/22/2017, but the mass was not clearly seen on stereotactic imaging. Left diagnostic mammogram was performed instead and showed the mass as smaller and less conspicuous. Short term follow up was recommended.   She underwent left diagnostic mammogram and left breast ultrasonography on 01/23/2018 and bilateral diagnostic mammogram on 10/11/2018. When she returned for follow up bilateral diagnostic mammogram on 10/15/2019, this showed: breast density category B; new 6 mm group of calcifications within the  upper-outer left breast; interval increase in size of lobular mass within the 12 o'clock position. Left breast ultrasonography performed that day measured the 12 o'clock left breast mass at 8 mm and showed no left axillary adenopathy.  Accordingly on 10/17/2019 she proceeded to biopsy of the left breast areas in question. 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   Pathology from the left 12 o'clock mass was negative for malignancy, showing fibrocystic changes with apocrine metaplasia. However, the biopsy clip was found to be located posterior to the mammographically identified mass, so she proceeded to repeat biopsy of the area on 10/30/2019. Pathology [id number]) was again benign, showing fibrocystic changes and fat necrosis.  She opted to proceed with left breast lumpectomy on 12/02/2019 under Dr. Brantley Stage. Pathology from the procedure ([id number]) showed: intermediate grade ductal carcinoma in situ, 0.6 cm; cauterized carcinoma in situ focally present at the lateral margin. Prognostic indicators significant for: estrogen receptor 90% positive with weak staining intensity, and progesterone receptor 100% positive with strong staining intensity.  The patient's subsequent history is as detailed below.   INTERVAL HISTORY: Rosamaria was evaluated in the breast cancer clinic on 12/16/2019 accompanied by  Domingo Madeira.   She is scheduled for re-excision of the positive margin on 12/24/2019.  Of note, she received a second opinion regarding surgical options from Dr. Elisha Headland at [facility] on 11/28/2019. According to her note, the patient underwent genetic testing that day as well.   REVIEW OF SYSTEMS: There were no specific symptoms leading to the  original mammogram, which was routinely scheduled. The patient denies unusual headaches, visual changes, nausea, vomiting, stiff neck, dizziness, or gait imbalance. There has  been no cough, phlegm production, or pleurisy, no chest pain or pressure, and no change in bowel or bladder habits. The patient denies fever, rash, bleeding, unexplained fatigue or unexplained weight loss. A detailed review of systems was otherwise entirely negative.   PAST MEDICAL HISTORY: Past Medical History:  Diagnosis Date  . Allergy   . Anxiety   . Colitis   . Crohn's disease ([place])   . Depression   . Diverticulosis   . Hyperlipidemia   . Hypertension   . Mild intermittent asthma    rarely uses inhaler  . Nephrolithiasis   . PONV (postoperative nausea and vomiting)     PAST SURGICAL HISTORY: Past Surgical History:  Procedure Laterality Date  . BREAST LUMPECTOMY WITH RADIOACTIVE SEED LOCALIZATION Left 12/02/2019   Procedure: LEFT BREAST LUMPECTOMY WITH RADIOACTIVE SEED LOCALIZATION;  Surgeon: Erroll Luna, MD;  Location: [facility];  Service: General;  Laterality: Left;  . COMBINED HYSTEROSCOPY DIAGNOSTIC / D&C    . LEEP    . LITHOTRIPSY    . WISDOM TOOTH EXTRACTION      FAMILY HISTORY: Family History  Problem Relation Age of Onset  . Dementia Father   . Alzheimer's disease Father   . Hyperlipidemia Other   . Hypertension Other   . Diabetes Other   . Allergic rhinitis Mother   . Food Allergy Mother        peanut  . Asthma Brother   . Crohn's disease Neg Hx   . Esophageal cancer Neg Hx   . Stomach cancer Neg Hx   . Rectal cancer Neg Hx   . Breast cancer Neg Hx   . Angioedema Neg Hx   . Eczema Neg Hx   . Immunodeficiency Neg Hx   . Urticaria Neg Hx    The patient's father died from Alzheimer's disease at age 3.  The patient's mother is 53 years old as of December 2020.  The patient denies a family hx of ovarian cancer. She reports breast cancer in a maternal cousin at age  69 (recurrent stage IV in her late 90s) and in a maternal great aunt. She also notes uterine cancer in her maternal grandmother, bladder cancer in an uncle, and colon cancer in her paternal grandmother.  The patient has 2 sisters, no brothers   GYNECOLOGIC HISTORY:  No LMP recorded. (Menstrual status: Perimenopausal). Menarche: 53 years old Age at first live birth: 53 years old GX P 3 LMP current, irregular, about every 2 months Contraceptive: Barrier methods HRT no Hysterectomy? no BSO?  No   SOCIAL HISTORY: (updated 11/2019)  [place] is currently working at the [employer].  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  Her husband Yvone Neu works at [employer].  The patient has 3 children, Corey 36 teaches physical education in [place], Legrand Como 17 and Sherese Crumb 15 are at home.  Can has a child Erasmo Downer from an earlier marriage.  She is 26     ADVANCED DIRECTIVES: In the absence of any documents to the contrary the patient's husband is her healthcare power of attorney   HEALTH MAINTENANCE: Social History   Tobacco Use  . Smoking status: Former Smoker    Packs/day: 1.00    Years: 10.00    Pack years: 10.00    Types: Cigarettes    Quit date: 12/26/1995    Years since quitting: 23.9  . Smokeless tobacco: Never Used  Substance Use Topics  .  Alcohol use: Yes    Comment: occasional wine   . Drug use: No     Colonoscopy: 12/2018, repeat in 5 years  PAP: Per Dr. Nori Riis  Bone density: Remote   Allergies  Allergen Reactions  . Azithromycin Hives  . Nitrofurantoin Other (See Comments)    abd pain  . Tamiflu [Oseltamivir Phosphate]     hives    Current Outpatient Medications  Medication Sig Dispense Refill  . Adalimumab (HUMIRA PEN) 40 MG/0.4ML PNKT Inject 40 mg into the skin every 14 (fourteen) days. 2 each 5  . AMBULATORY NON FORMULARY MEDICATION CBD Oil 1 dose twice a day    . amLODipine (NORVASC) 5 MG tablet TAKE ONE (1)  TABLET BY MOUTH EVERY DAY 30 tablet 0  . ASHWAGANDHA PO Take 1 tablet by mouth daily.    Marland Kitchen desloratadine (CLARINEX) 5 MG tablet Take 1 tablet by mouth daily.  4  . ergocalciferol (VITAMIN D2) 1.25 MG (50000 UT) capsule TAKE 1 CAPSULE BY MOUTH ONCE WEEKLY    . ezetimibe (ZETIA) 10 MG tablet Take 1 tablet (10 mg total) by mouth daily. 90 tablet 3  . FLUoxetine (PROZAC) 20 MG capsule TAKE ONE CAPSULE BY MOUTH DAILY 30 capsule 0  . fluticasone (FLOVENT HFA) 110 MCG/ACT inhaler Inhale 2 puffs into the lungs 2 (two) times daily. 1 Inhaler 0  . icosapent Ethyl (VASCEPA) 1 g capsule Take 2 capsules (2 g total) by mouth 2 (two) times daily. 120 capsule 2  . indapamide (LOZOL) 1.25 MG tablet   9  . levocetirizine (XYZAL) 5 MG tablet Take 1 tablet (5 mg total) by mouth every evening. 30 tablet 0  . mesalamine (LIALDA) 1.2 g EC tablet TAKE TWO (2) TABLETS BY MOUTH 2 TIMES DAILY 120 tablet 4  . montelukast (SINGULAIR) 10 MG tablet Take 1 tablet (10 mg total) by mouth at bedtime. 30 tablet 0  . potassium citrate (UROCIT-K) 10 MEQ (1080 MG) SR tablet Take 2 tablets (20 mEq total) by mouth daily. 60 tablet 5  . vitamin B-12 (CYANOCOBALAMIN) 1000 MCG tablet Take 1,000 mcg by mouth daily.    Marland Kitchen albuterol (PROVENTIL HFA;VENTOLIN HFA) 108 (90 Base) MCG/ACT inhaler Inhale 2 puffs into the lungs every 4 (four) hours as needed for wheezing or shortness of breath. (Patient not taking: Reported on 12/16/2019) 1 Inhaler 2  . ALPRAZolam (XANAX) 0.5 MG tablet Take 1 tablet (0.5 mg total) by mouth as needed for anxiety. (Patient not taking: Reported on 12/16/2019) 10 tablet 0  . ibuprofen (ADVIL) 800 MG tablet Take 1 tablet (800 mg total) by mouth every 8 (eight) hours as needed. (Patient not taking: Reported on 12/16/2019) 30 tablet 0  . oxyCODONE (OXY IR/ROXICODONE) 5 MG immediate release tablet Take 1 tablet (5 mg total) by mouth every 6 (six) hours as needed for severe pain. (Patient not taking: Reported on 12/16/2019) 15  tablet 0   No current facility-administered medications for this visit.    OBJECTIVE: Middle-aged white woman who appears stated age  9:   12/16/19 1604  BP: 133/68  Pulse: 77  Resp: 20  Temp: 98.2 F (36.8 C)  SpO2: 99%     Body mass index is 34.6 kg/m.   Wt Readings from Last 3 Encounters:  12/16/19 201 lb 9.6 oz (91.4 kg)  12/02/19 197 lb 8.5 oz (89.6 kg)  11/26/19 193 lb (87.5 kg)      ECOG FS:1 - Symptomatic but completely ambulatory  Ocular: Sclerae unicteric, pupils round  and equal Ear-nose-throat: Wearing a mask Lymphatic: No cervical or supraclavicular adenopathy Lungs no rales or rhonchi Heart regular rate and rhythm Abd soft, nontender, positive bowel sounds MSK no focal spinal tenderness, no joint edema Neuro: non-focal, well-oriented, appropriate affect Breasts: The right breast is unremarkable.  The left breast is status post recent lumpectomy.  There is minimal dehiscence with a very shallow base which is clear.  Both axillae are benign.   LAB RESULTS:  CMP     Component Value Date/Time   NA 136 11/28/2019 1500   K 3.4 (L) 11/28/2019 1500   CL 101 11/28/2019 1500   CO2 24 11/28/2019 1500   GLUCOSE 150 (H) 11/28/2019 1500   GLUCOSE 94 12/31/2006 1033   BUN 15 11/28/2019 1500   CREATININE 0.53 11/28/2019 1500   CREATININE 0.66 11/12/2015 1452   CALCIUM 9.4 11/28/2019 1500   PROT 6.9 11/28/2019 1500   ALBUMIN 4.0 11/28/2019 1500   AST 26 11/28/2019 1500   ALT 32 11/28/2019 1500   ALKPHOS 56 11/28/2019 1500   BILITOT 0.8 11/28/2019 1500   GFRNONAA >60 11/28/2019 1500   GFRAA >60 11/28/2019 1500    No results found for: TOTALPROTELP, ALBUMINELP, A1GS, A2GS, BETS, BETA2SER, GAMS, MSPIKE, SPEI  Lab Results  Component Value Date   WBC 8.5 11/28/2019   NEUTROABS 4.6 11/28/2019   HGB 14.6 11/28/2019   HCT 43.9 11/28/2019   MCV 88.9 11/28/2019   PLT 271 11/28/2019    No results found for: LABCA2  No components found for:  AJOINO676  No results for input(s): INR in the last 168 hours.  No results found for: LABCA2  No results found for: HMC947  No results found for: SJG283  No results found for: MOQ947  No results found for: CA2729  No components found for: HGQUANT  No results found for: CEA1 / No results found for: CEA1   No results found for: AFPTUMOR  No results found for: CHROMOGRNA  No results found for: KPAFRELGTCHN, LAMBDASER, KAPLAMBRATIO (kappa/lambda light chains)  No results found for: HGBA, HGBA2QUANT, HGBFQUANT, HGBSQUAN (Hemoglobinopathy evaluation)   No results found for: LDH  Lab Results  Component Value Date   IRON 101 09/06/2010   IRONPCTSAT 18.3 (L) 09/06/2010   (Iron and TIBC)  No results found for: FERRITIN  Urinalysis    Component Value Date/Time   COLORURINE YELLOW 01/05/2015 [place] 01/05/2015 1143   LABSPEC 1.015 01/05/2015 1143   PHURINE 7.0 01/05/2015 1143   GLUCOSEU NEGATIVE 01/05/2015 1143   GLUCOSEU NEGATIVE 05/08/2012 0811   HGBUR MODERATE (A) 01/05/2015 1143   HGBUR large 09/19/2010 1245   BILIRUBINUR neg 10/24/2016 0851   KETONESUR NEGATIVE 01/05/2015 1143   PROTEINUR 1+ 10/24/2016 0851   PROTEINUR NEGATIVE 01/05/2015 1143   UROBILINOGEN 0.2 10/24/2016 0851   UROBILINOGEN 0.2 01/05/2015 1143   NITRITE neg 10/24/2016 0851   NITRITE NEGATIVE 01/05/2015 1143   LEUKOCYTESUR Negative 10/24/2016 0851     STUDIES: MM Breast Surgical Specimen  Result Date: 12/02/2019 CLINICAL DATA:  Left lumpectomy for recently diagnosed DCIS. EXAM: SPECIMEN RADIOGRAPH OF THE LEFT BREAST COMPARISON:  Previous exam(s). FINDINGS: Status post excision of the left breast. The radioactive seed and X shaped biopsy marker clip are present, completely intact. IMPRESSION: Specimen radiograph of the left breast. Electronically Signed   By: Claudie Revering M.D.   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 EXAM: MAMMOGRAPHIC GUIDED RADIOACTIVE SEED LOCALIZATION OF THE LEFT BREAST COMPARISON:  Previous exam(s). FINDINGS: Patient presents for radioactive seed localization prior to breast conservation surgery. I met with the patient and we discussed the procedure of seed localization including benefits and alternatives. We discussed the high likelihood of a successful procedure. We discussed the risks of the procedure including infection, bleeding, tissue injury and further surgery. We discussed the low dose of radioactivity involved in the procedure. Informed, written consent was given. The usual time-out protocol was performed immediately prior to the procedure. Using mammographic guidance, sterile technique, 1% lidocaine and an I-125 radioactive seed, the X shaped clip within the upper-outer quadrant of the LEFT breast was localized using a lateral approach. The follow-up mammogram images confirm the seed in the expected location and were marked for Dr. Brantley Stage. Follow-up survey of the patient confirms presence of the radioactive seed. Order number of I-125 seed:  [id number]. Total activity:  3.295 millicuries reference Date: 10/03/2019 The patient tolerated the procedure well and was released from the [place] [facility]. She was given instructions regarding seed removal. IMPRESSION: Radioactive seed localization left breast. No apparent complications. Electronically Signed   By: Franki Cabot M.D.   On: 12/01/2019 13:53     ELIGIBLE FOR AVAILABLE RESEARCH PROTOCOL: no  ASSESSMENT: 53 y.o. [place],  woman status post left lumpectomy 12/02/2023 ductal carcinoma in situ, grade 2, measuring 0.6 cm, with a positive lateral margin  (a) additional surgery for margin clearance 12/24/2019  (1) adjuvant radiation  (2)  consider antiestrogens  (3) genetics testing 11/28/2019, results pending  PLAN: I spent approximately 50 minutes face to face with Baldo Ash with more than 50% of that time spent in counseling and coordination of care.  Naileah understands that in noninvasive ductal carcinoma, also called ductal carcinoma in situ ("DCIS") the breast cancer cells remain trapped in the ducts were they started. They cannot travel to a vital organ. For that reason these cancers in themselves are not life-threatening.  If the whole breast is removed then all the ducts are removed and since the cancer cells are trapped in the ducts, the cure rate with mastectomy for noninvasive breast cancer is approximately 99%. Nevertheless we recommend lumpectomy, because there is no survival advantage to mastectomy and because the cosmetic result is generally superior with breast conservation.  Since the patient is keeping her breast, there will be some risk of recurrence. The recurrence can only be in the same breast since, again, the cells are trapped in the ducts. There is no connection from one breast to the other. The risk of local recurrence is cut by more than half with radiation, which is standard in this situation.  In estrogen receptor positive cancers like Destyni's, anti-estrogens can also be considered. They will further reduce the risk of recurrence by one half. In addition anti-estrogens will lower the risk of a new breast cancer developing in either breast, also by one half.   Delorus does qualify for genetics testing and she had her test drawn 11/28/2019. In patients who carry a deleterious mutation [for example in a  BRCA gene], the risk of a new breast cancer developing in the future may be sufficiently great that the patient may choose bilateral mastectomies. However if she wishes to keep her breasts in that situation it is safe to do so. That would require intensified screening, which generally means  not only  yearly mammography but a yearly breast MRI as well. Of course, if there is a deleterious mutation bilateral oophorectomy would be necessary as there is no standard screening protocol for ovarian cancer.  Marshella is very clear that if she did carry a deleterious mutation she would still not want bilateral mastectomies but would choose intensified screening.  Accordingly the overall plan is for surgery, followed by radiation, then a discussion of anti-estrogens.  She will see me again at the end of her local treatment to discuss antiestrogens.  Pessy has a good understanding of the overall plan. She agrees with it. She knows the goal of treatment in her case is cure. She will call with any problems that may develop before her next visit here.   Chauncey Cruel, MD   12/16/2019 5:00 PM Medical Oncology and Hematology [facility] [place], [place] [address] Tel. [phone]    Fax. [phone]   This document serves as a record of services personally performed by Lurline Del, MD. It was created on his behalf by Wilburn Mylar, a trained medical scribe. The creation of this record is based on the scribe's personal observations and the provider's statements to them.   I, Lurline Del MD, have reviewed the above documentation for accuracy and completeness, and I agree with the above.

## 2019-12-16 ENCOUNTER — Encounter: Payer: Self-pay | Admitting: *Deleted

## 2019-12-16 ENCOUNTER — Encounter: Payer: Self-pay | Admitting: Oncology

## 2019-12-16 ENCOUNTER — Other Ambulatory Visit: Payer: Self-pay

## 2019-12-16 ENCOUNTER — Inpatient Hospital Stay: Payer: BC Managed Care – PPO | Attending: Oncology | Admitting: Oncology

## 2019-12-16 ENCOUNTER — Inpatient Hospital Stay: Payer: BC Managed Care – PPO

## 2019-12-16 VITALS — BP 133/68 | HR 77 | Temp 98.2°F | Resp 20 | Ht 64.0 in | Wt 201.6 lb

## 2019-12-16 DIAGNOSIS — D0512 Intraductal carcinoma in situ of left breast: Secondary | ICD-10-CM

## 2019-12-16 DIAGNOSIS — C50412 Malignant neoplasm of upper-outer quadrant of left female breast: Secondary | ICD-10-CM

## 2019-12-16 DIAGNOSIS — Z170 Estrogen receptor positive status [ER+]: Secondary | ICD-10-CM | POA: Insufficient documentation

## 2019-12-16 NOTE — Progress Notes (Signed)
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 female who presented with breast abnormality on the following imaging: screening mammogram on the date of 10/08/2017.  Symptoms, if any, at that time, were: none.  Biopsy was not performed at that time due to the mass being smaller and less visible.  She continued on short-term observation.  Most recently, ultrasound of breast on 10/15/2019 revealed 8 mm solid and cystic mass left breast 12 o'clock an indeterminate left breast calcifications.   Biopsy on date of 10/17/2019 showed fibrocystic changes with apocrine metaplasia at the 12 o'clock mass, DCIS at calcifications.  ER status: positive; PR status positive; Grade 3.  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 - Cauterized carcinoma in situ is present at the lateral margin focally (on ink). Intermediate-grade   Reexcision 12/24/19 scheduled.  Lymphedema issues, if any:  She denies.   Pain issues, if any:  She denies.   SAFETY ISSUES:  Prior radiation? No  Pacemaker/ICD? No  Possible current pregnancy?  she has occasional periods. She uses condoms.  Negative Pregnancy test 12/02/19.  Is the patient on  methotrexate? No   PREVIOUS RADIATION THERAPY: No  PAST MEDICAL HISTORY:  has a past medical history of Allergy, Anxiety, Colitis, Crohn's disease ([place]), Depression, Diverticulosis, Hyperlipidemia, Hypertension, Mild intermittent asthma, Nephrolithiasis, and PONV (postoperative nausea and vomiting).    PAST SURGICAL HISTORY: Past Surgical History:  Procedure Laterality Date  . BREAST LUMPECTOMY WITH RADIOACTIVE SEED LOCALIZATION Left 12/02/2019   Procedure: LEFT BREAST LUMPECTOMY WITH RADIOACTIVE SEED LOCALIZATION;  Surgeon: Erroll Luna, MD;  Location: [facility];  Service: General;  Laterality: Left;  . COMBINED HYSTEROSCOPY DIAGNOSTIC / D&C    . LEEP    . LITHOTRIPSY    . WISDOM TOOTH EXTRACTION      FAMILY HISTORY: family history includes Allergic rhinitis in her mother; Alzheimer's disease in her father; Asthma in her brother; Dementia in her father; Diabetes in an other family member; Food Allergy in her mother; Hyperlipidemia in an other family member; Hypertension in an other family member.  SOCIAL HISTORY:  reports that she quit smoking about 23 years ago. Her smoking use included cigarettes. She has a 10.00 pack-year smoking history. She has never used smokeless tobacco. She reports current alcohol use. She reports that she does not use drugs.  ALLERGIES: Azithromycin, Nitrofurantoin, and Tamiflu [oseltamivir phosphate]  MEDICATIONS:  Current Outpatient Medications  Medication Sig Dispense Refill  . Adalimumab (HUMIRA PEN) 40 MG/0.4ML PNKT Inject 40 mg into the skin every 14 (fourteen) days. 2 each 5  . albuterol (PROVENTIL HFA;VENTOLIN HFA) 108 (90 Base) MCG/ACT inhaler Inhale 2 puffs into the lungs every 4 (four) hours as needed for wheezing or shortness of  breath. (Patient not taking: Reported on 12/16/2019) 1 Inhaler 2  . ALPRAZolam (XANAX) 0.5 MG tablet Take 1 tablet (0.5 mg total) by mouth as needed for anxiety. (Patient not taking: Reported on  12/16/2019) 10 tablet 0  . AMBULATORY NON FORMULARY MEDICATION CBD Oil 1 dose twice a day    . amLODipine (NORVASC) 5 MG tablet TAKE ONE (1) TABLET BY MOUTH EVERY DAY 30 tablet 0  . ASHWAGANDHA PO Take 1 tablet by mouth daily.    Marland Kitchen desloratadine (CLARINEX) 5 MG tablet Take 1 tablet by mouth daily.  4  . ergocalciferol (VITAMIN D2) 1.25 MG (50000 UT) capsule TAKE 1 CAPSULE BY MOUTH ONCE WEEKLY    . ezetimibe (ZETIA) 10 MG tablet Take 1 tablet (10 mg total) by mouth daily. 90 tablet 3  . FLUoxetine (PROZAC) 20 MG capsule TAKE ONE CAPSULE BY MOUTH DAILY 30 capsule 0  . fluticasone (FLOVENT HFA) 110 MCG/ACT inhaler Inhale 2 puffs into the lungs 2 (two) times daily. 1 Inhaler 0  . ibuprofen (ADVIL) 800 MG tablet Take 1 tablet (800 mg total) by mouth every 8 (eight) hours as needed. (Patient not taking: Reported on 12/16/2019) 30 tablet 0  . icosapent Ethyl (VASCEPA) 1 g capsule Take 2 capsules (2 g total) by mouth 2 (two) times daily. 120 capsule 2  . indapamide (LOZOL) 1.25 MG tablet   9  . levocetirizine (XYZAL) 5 MG tablet Take 1 tablet (5 mg total) by mouth every evening. 30 tablet 0  . mesalamine (LIALDA) 1.2 g EC tablet TAKE TWO (2) TABLETS BY MOUTH 2 TIMES DAILY 120 tablet 4  . montelukast (SINGULAIR) 10 MG tablet Take 1 tablet (10 mg total) by mouth at bedtime. 30 tablet 0  . oxyCODONE (OXY IR/ROXICODONE) 5 MG immediate release tablet Take 1 tablet (5 mg total) by mouth every 6 (six) hours as needed for severe pain. (Patient not taking: Reported on 12/16/2019) 15 tablet 0  . potassium citrate (UROCIT-K) 10 MEQ (1080 MG) SR tablet Take 2 tablets (20 mEq total) by mouth daily. 60 tablet 5  . vitamin B-12 (CYANOCOBALAMIN) 1000 MCG tablet Take 1,000 mcg by mouth daily.     No current facility-administered medications for this encounter.    REVIEW OF SYSTEMS:  As above   PHYSICAL EXAM:  vitals were not taken for this visit.   General: Alert and oriented, in no acute distress   LABORATORY  DATA:  Lab Results  Component Value Date   WBC 8.5 11/28/2019   HGB 14.6 11/28/2019   HCT 43.9 11/28/2019   MCV 88.9 11/28/2019   PLT 271 11/28/2019   CMP     Component Value Date/Time   NA 136 11/28/2019 1500   K 3.4 (L) 11/28/2019 1500   CL 101 11/28/2019 1500   CO2 24 11/28/2019 1500   GLUCOSE 150 (H) 11/28/2019 1500   GLUCOSE 94 12/31/2006 1033   BUN 15 11/28/2019 1500   CREATININE 0.53 11/28/2019 1500   CREATININE 0.66 11/12/2015 1452   CALCIUM 9.4 11/28/2019 1500   PROT 6.9 11/28/2019 1500   ALBUMIN 4.0 11/28/2019 1500   AST 26 11/28/2019 1500   ALT 32 11/28/2019 1500   ALKPHOS 56 11/28/2019 1500   BILITOT 0.8 11/28/2019 1500   GFRNONAA >60 11/28/2019 1500   GFRAA >60 11/28/2019 1500         RADIOGRAPHY: MM Breast Surgical Specimen  Result Date: 12/02/2019 CLINICAL DATA:  Left lumpectomy for recently diagnosed DCIS. EXAM: SPECIMEN RADIOGRAPH OF THE  LEFT BREAST COMPARISON:  Previous exam(s). FINDINGS: Status post excision of the left breast. The radioactive seed and X shaped biopsy marker clip are present, completely intact. IMPRESSION: Specimen radiograph of the left breast. Electronically Signed   By: Claudie Revering M.D.   On: 12/02/2019 09:58   MM LT RADIOACTIVE SEED LOC MAMMO GUIDE  Result Date: 12/01/2019 CLINICAL DATA:  Patient with biopsy-proven DCIS within the upper-outer quadrant of the LEFT breast scheduled for breast conservation surgery requiring preoperative radioactive seed localization. EXAM: MAMMOGRAPHIC GUIDED RADIOACTIVE SEED LOCALIZATION OF THE LEFT BREAST COMPARISON:  Previous exam(s). FINDINGS: Patient presents for radioactive seed localization prior to breast conservation surgery. I met with the patient and we discussed the procedure of seed localization including benefits and alternatives. We discussed the high likelihood of a successful procedure. We discussed the risks of the procedure including infection, bleeding, tissue injury and further  surgery. We discussed the low dose of radioactivity involved in the procedure. Informed, written consent was given. The usual time-out protocol was performed immediately prior to the procedure. Using mammographic guidance, sterile technique, 1% lidocaine and an I-125 radioactive seed, the X shaped clip within the upper-outer quadrant of the LEFT breast was localized using a lateral approach. The follow-up mammogram images confirm the seed in the expected location and were marked for Dr. Brantley Stage. Follow-up survey of the patient confirms presence of the radioactive seed. Order number of I-125 seed:  [id number]. Total activity:  0.109 millicuries reference Date: 10/03/2019 The patient tolerated the procedure well and was released from the [facility]. She was given instructions regarding seed removal. IMPRESSION: Radioactive seed localization left breast. No apparent complications. Electronically Signed   By: Franki Cabot M.D.   On: 12/01/2019 13:53      IMPRESSION/PLAN: Left Breast DCIS   It was a pleasure meeting the patient today. We discussed the risks, benefits, and side effects of radiotherapy. I recommend radiotherapy to the left breast to reduce her risk of locoregional recurrence by 1/2.  We discussed that radiation would take approximately 4 weeks to complete and that I would give the patient a few weeks to heal following surgery before starting treatment planning. We spoke about acute effects including skin irritation and fatigue as well as much less common late effects including internal organ injury or irritation. We spoke about the latest technology that is used to minimize the risk of late effects for patients undergoing radiotherapy to the breast or chest wall. No guarantees of treatment were given. The patient is enthusiastic about proceeding with treatment. I look forward to participating in the patient's care.    I will schedule CT simulation for 1/19 to allowing healing from pending  re-excision. She is pleased with this plan.  This encounter was provided by telemedicine platform MyChart Video.  The patient has given verbal consent for this type of encounter and has been advised to only accept a meeting of this type in a secure network environment. The time spent during this encounter was over 20 minutes. The attendants for this meeting include Eppie Gibson  and Georg Ruddle.  During the encounter, Eppie Gibson was located at [facility] Radiation Oncology Department.  Tere Mcconaughey was located at home.    -----------------------------------  Eppie Gibson, MD  This document serves as a record of services personally performed by Eppie Gibson, MD. It was created on her behalf by Wilburn Mylar, a trained medical scribe. The creation of this record is based on the scribe's  personal observations and the provider's statements to them. This document has been checked and approved by the attending provider.

## 2019-12-16 NOTE — Progress Notes (Signed)
Location of Breast Cancer: Left Breast  Histology per Pathology Report:  10/17/19 Diagnosis 1. Breast, left, needle core biopsy, 12 o'clock (mass) - FIBROCYSTIC CHANGES WITH APOCRINE METAPLASIA - NO MALIGNANCY IDENTIFIED 2. Breast, left, needle core biopsy, upper outer (calcs) - DUCTAL CARCINOMA IN SITU - CALCIFICATIONS  Receptor Status: ER(95%), PR (90%)  12/02/19 FINAL MICROSCOPIC DIAGNOSIS: A. BREAST, LEFT, LUMPECTOMY: - Intermediate grade ductal carcinoma in situ, spanning 0.6 cm. - Cauterized carcinoma in situ is present at the lateral margin focally (on ink). - See oncology table. B. BREAST, LEFT ADDITIONAL MEDIAL MARGIN, EXCISION: - Benign breast tissue. - No malignancy identified.  Did patient present with symptoms or was this found on screening mammography?: It was found on a screening mammogram.  Past/Anticipated interventions by surgeon, if any: 12/02/19 Procedure: Left breast seed localized lumpectomy Surgeon: Erroll Luna M.D.  **Re-excision scheduled for 12/24/19  Past/Anticipated interventions by medical oncology, if any:  12/16/19 Dr. Jana Hakim Accordingly the overall plan is for surgery, followed by radiation, then a discussion of anti-estrogens.  She will see me again at the end of her local treatment to discuss antiestrogens  Lymphedema issues, if any:  She denies.   Pain issues, if any:  She denies.   SAFETY ISSUES:  Prior radiation? No  Pacemaker/ICD? No  Possible current pregnancy? No, she has occasional periods. She uses condoms.   Is the patient on methotrexate? No  Current Complaints / other details:     Lindsey [name], Stephani Police, RN 12/16/2019,2:23 PM

## 2019-12-17 ENCOUNTER — Telehealth: Payer: Self-pay

## 2019-12-17 ENCOUNTER — Encounter: Payer: Self-pay | Admitting: Radiation Oncology

## 2019-12-17 ENCOUNTER — Other Ambulatory Visit: Payer: Self-pay

## 2019-12-17 ENCOUNTER — Ambulatory Visit
Admission: RE | Admit: 2019-12-17 | Discharge: 2019-12-17 | Disposition: A | Discharge status: Home / Self Care | Payer: BC Managed Care – PPO | Source: Ambulatory Visit | Attending: Radiation Oncology | Admitting: Radiation Oncology

## 2019-12-17 ENCOUNTER — Telehealth: Payer: Self-pay | Admitting: Oncology

## 2019-12-17 DIAGNOSIS — Z170 Estrogen receptor positive status [ER+]: Secondary | ICD-10-CM

## 2019-12-17 DIAGNOSIS — C50412 Malignant neoplasm of upper-outer quadrant of left female breast: Secondary | ICD-10-CM

## 2019-12-17 DIAGNOSIS — D0512 Intraductal carcinoma in situ of left breast: Secondary | ICD-10-CM

## 2019-12-17 NOTE — Telephone Encounter (Signed)
I left a message regarding schedule  

## 2019-12-17 NOTE — Telephone Encounter (Signed)
TC from Pt stating that she can receive Covid 19 vaccination. Pt wanted to know if it was ok and what would be Dr. Virgie Dad recommendation. Informed Pt. Per Dr. Jana Hakim ok to get vaccination. Pt verbalized understanding.

## 2019-12-20 ENCOUNTER — Other Ambulatory Visit (HOSPITAL_COMMUNITY)
Admission: RE | Admit: 2019-12-20 | Discharge: 2019-12-20 | Disposition: A | Discharge status: Home / Self Care | Payer: BC Managed Care – PPO | Source: Ambulatory Visit | Attending: Surgery | Admitting: Surgery

## 2019-12-20 DIAGNOSIS — Z01812 Encounter for preprocedural laboratory examination: Secondary | ICD-10-CM | POA: Diagnosis present

## 2019-12-20 DIAGNOSIS — Z20828 Contact with and (suspected) exposure to other viral communicable diseases: Secondary | ICD-10-CM | POA: Insufficient documentation

## 2019-12-21 LAB — NOVEL CORONAVIRUS, NAA (HOSP ORDER, SEND-OUT TO REF LAB; TAT 18-24 HRS): SARS-CoV-2, NAA: NOT DETECTED

## 2019-12-22 ENCOUNTER — Telehealth: Payer: BC Managed Care – PPO | Admitting: Radiation Oncology

## 2019-12-22 ENCOUNTER — Telehealth: Payer: Self-pay | Admitting: *Deleted

## 2019-12-22 NOTE — Telephone Encounter (Signed)
CALLED PATIENT TO ASK ABOUT COMING IN FOR A LAB ON 01/12/20 , PATIENT AGREED TO COME IN @ 8 AM FOR THIS LAB

## 2019-12-23 ENCOUNTER — Telehealth: Payer: BC Managed Care – PPO | Admitting: Radiation Oncology

## 2019-12-24 ENCOUNTER — Ambulatory Visit (HOSPITAL_BASED_OUTPATIENT_CLINIC_OR_DEPARTMENT_OTHER)
Admission: RE | Admit: 2019-12-24 | Discharge: 2019-12-24 | Disposition: A | Discharge status: Home / Self Care | Payer: BC Managed Care – PPO | Attending: Surgery | Admitting: Surgery

## 2019-12-24 ENCOUNTER — Other Ambulatory Visit: Payer: Self-pay

## 2019-12-24 ENCOUNTER — Ambulatory Visit (HOSPITAL_BASED_OUTPATIENT_CLINIC_OR_DEPARTMENT_OTHER): Payer: BC Managed Care – PPO | Admitting: Anesthesiology

## 2019-12-24 ENCOUNTER — Encounter (HOSPITAL_BASED_OUTPATIENT_CLINIC_OR_DEPARTMENT_OTHER)
Admission: RE | Disposition: A | Discharge status: Home / Self Care | Payer: Self-pay | Source: Home / Self Care | Attending: Surgery

## 2019-12-24 ENCOUNTER — Encounter (HOSPITAL_BASED_OUTPATIENT_CLINIC_OR_DEPARTMENT_OTHER): Payer: Self-pay | Admitting: Surgery

## 2019-12-24 DIAGNOSIS — Z87891 Personal history of nicotine dependence: Secondary | ICD-10-CM | POA: Insufficient documentation

## 2019-12-24 DIAGNOSIS — K5090 Crohn's disease, unspecified, without complications: Secondary | ICD-10-CM | POA: Diagnosis not present

## 2019-12-24 DIAGNOSIS — D0512 Intraductal carcinoma in situ of left breast: Secondary | ICD-10-CM | POA: Diagnosis present

## 2019-12-24 DIAGNOSIS — Z881 Allergy status to other antibiotic agents status: Secondary | ICD-10-CM | POA: Diagnosis not present

## 2019-12-24 DIAGNOSIS — F329 Major depressive disorder, single episode, unspecified: Secondary | ICD-10-CM | POA: Insufficient documentation

## 2019-12-24 DIAGNOSIS — F419 Anxiety disorder, unspecified: Secondary | ICD-10-CM | POA: Diagnosis not present

## 2019-12-24 DIAGNOSIS — J4520 Mild intermittent asthma, uncomplicated: Secondary | ICD-10-CM | POA: Insufficient documentation

## 2019-12-24 DIAGNOSIS — Z888 Allergy status to other drugs, medicaments and biological substances status: Secondary | ICD-10-CM | POA: Insufficient documentation

## 2019-12-24 DIAGNOSIS — I10 Essential (primary) hypertension: Secondary | ICD-10-CM | POA: Diagnosis not present

## 2019-12-24 HISTORY — PX: RE-EXCISION OF BREAST LUMPECTOMY: SHX6048

## 2019-12-24 LAB — POCT PREGNANCY, URINE: Preg Test, Ur: NEGATIVE

## 2019-12-24 SURGERY — EXCISION, LESION, BREAST
Anesthesia: General | Site: Breast | Laterality: Left

## 2019-12-24 MED ORDER — SCOPOLAMINE 1 MG/3DAYS TD PT72
1.0000 | MEDICATED_PATCH | TRANSDERMAL | Status: DC
  Administered 2019-12-24: 10:00:00 1.5 mg via TRANSDERMAL

## 2019-12-24 MED ORDER — IBUPROFEN 800 MG PO TABS: 800.0000 mg | ORAL_TABLET | Freq: Three times a day (TID) | ORAL | 0 refills | Status: DC | PRN

## 2019-12-24 MED ORDER — ONDANSETRON HCL 4 MG/2ML IJ SOLN
INTRAMUSCULAR | Status: AC
  Filled 2019-12-24: qty 2

## 2019-12-24 MED ORDER — ACETAMINOPHEN 325 MG PO TABS
ORAL_TABLET | ORAL | Status: AC
  Filled 2019-12-24: qty 2

## 2019-12-24 MED ORDER — CEFAZOLIN SODIUM-DEXTROSE 2-4 GM/100ML-% IV SOLN
2.0000 g | INTRAVENOUS | Status: AC
  Administered 2019-12-24: 2 g via INTRAVENOUS

## 2019-12-24 MED ORDER — ONDANSETRON HCL 4 MG/2ML IJ SOLN
INTRAMUSCULAR | Status: DC | PRN
  Administered 2019-12-24: 4 mg via INTRAVENOUS

## 2019-12-24 MED ORDER — ACETAMINOPHEN 325 MG PO TABS
650.0000 mg | ORAL_TABLET | Freq: Once | ORAL | Status: AC
  Administered 2019-12-24: 650 mg via ORAL

## 2019-12-24 MED ORDER — SCOPOLAMINE 1 MG/3DAYS TD PT72
MEDICATED_PATCH | TRANSDERMAL | Status: AC
  Filled 2019-12-24: qty 1

## 2019-12-24 MED ORDER — OXYCODONE HCL 5 MG PO TABS: 5.0000 mg | ORAL_TABLET | Freq: Once | ORAL | Status: DC | PRN

## 2019-12-24 MED ORDER — EPHEDRINE SULFATE 50 MG/ML IJ SOLN
INTRAMUSCULAR | Status: DC | PRN
  Administered 2019-12-24: 5 mg via INTRAVENOUS

## 2019-12-24 MED ORDER — ONDANSETRON HCL 4 MG/2ML IJ SOLN: 4.0000 mg | Freq: Four times a day (QID) | INTRAMUSCULAR | Status: DC | PRN

## 2019-12-24 MED ORDER — LIDOCAINE HCL (CARDIAC) PF 100 MG/5ML IV SOSY
PREFILLED_SYRINGE | INTRAVENOUS | Status: DC | PRN
  Administered 2019-12-24: 60 mg via INTRAVENOUS

## 2019-12-24 MED ORDER — FENTANYL CITRATE (PF) 250 MCG/5ML IJ SOLN
INTRAMUSCULAR | Status: DC | PRN
  Administered 2019-12-24 (×2): 50 ug via INTRAVENOUS

## 2019-12-24 MED ORDER — CHLORHEXIDINE GLUCONATE CLOTH 2 % EX PADS: 6.0000 | MEDICATED_PAD | Freq: Once | CUTANEOUS | Status: DC

## 2019-12-24 MED ORDER — DEXAMETHASONE SODIUM PHOSPHATE 10 MG/ML IJ SOLN
INTRAMUSCULAR | Status: DC | PRN
  Administered 2019-12-24: 8 mg via INTRAVENOUS

## 2019-12-24 MED ORDER — MIDAZOLAM HCL 5 MG/5ML IJ SOLN
INTRAMUSCULAR | Status: DC | PRN
  Administered 2019-12-24: 2 mg via INTRAVENOUS

## 2019-12-24 MED ORDER — DEXAMETHASONE SODIUM PHOSPHATE 10 MG/ML IJ SOLN
INTRAMUSCULAR | Status: AC
  Filled 2019-12-24: qty 1

## 2019-12-24 MED ORDER — OXYCODONE HCL 5 MG/5ML PO SOLN: 5.0000 mg | Freq: Once | ORAL | Status: DC | PRN

## 2019-12-24 MED ORDER — MIDAZOLAM HCL 2 MG/2ML IJ SOLN
INTRAMUSCULAR | Status: AC
  Filled 2019-12-24: qty 2

## 2019-12-24 MED ORDER — EPHEDRINE 5 MG/ML INJ
INTRAVENOUS | Status: AC
  Filled 2019-12-24: qty 10

## 2019-12-24 MED ORDER — BUPIVACAINE-EPINEPHRINE 0.25% -1:200000 IJ SOLN
INTRAMUSCULAR | Status: DC | PRN
  Administered 2019-12-24: 20 mL

## 2019-12-24 MED ORDER — ROCURONIUM BROMIDE 10 MG/ML (PF) SYRINGE
PREFILLED_SYRINGE | INTRAVENOUS | Status: AC
  Filled 2019-12-24: qty 10

## 2019-12-24 MED ORDER — FENTANYL CITRATE (PF) 100 MCG/2ML IJ SOLN: 25.0000 ug | INTRAMUSCULAR | Status: DC | PRN

## 2019-12-24 MED ORDER — CEFAZOLIN SODIUM-DEXTROSE 2-4 GM/100ML-% IV SOLN
INTRAVENOUS | Status: AC
  Filled 2019-12-24: qty 100

## 2019-12-24 MED ORDER — FENTANYL CITRATE (PF) 100 MCG/2ML IJ SOLN
INTRAMUSCULAR | Status: AC
  Filled 2019-12-24: qty 2

## 2019-12-24 MED ORDER — LIDOCAINE 2% (20 MG/ML) 5 ML SYRINGE
INTRAMUSCULAR | Status: AC
  Filled 2019-12-24: qty 10

## 2019-12-24 MED ORDER — PROPOFOL 10 MG/ML IV BOLUS
INTRAVENOUS | Status: DC | PRN
  Administered 2019-12-24: 180 mg via INTRAVENOUS

## 2019-12-24 MED ORDER — LACTATED RINGERS IV SOLN: INTRAVENOUS | Status: DC

## 2019-12-24 SURGICAL SUPPLY — 48 items
ADH SKN CLS APL DERMABOND .7 (GAUZE/BANDAGES/DRESSINGS) ×1
APL PRP STRL LF DISP 70% ISPRP (MISCELLANEOUS) ×1
APPLIER CLIP 9.375 MED OPEN (MISCELLANEOUS)
APR CLP MED 9.3 20 MLT OPN (MISCELLANEOUS)
BINDER BREAST LRG (GAUZE/BANDAGES/DRESSINGS) IMPLANT
BINDER BREAST MEDIUM (GAUZE/BANDAGES/DRESSINGS) IMPLANT
BINDER BREAST XLRG (GAUZE/BANDAGES/DRESSINGS) IMPLANT
BINDER BREAST XXLRG (GAUZE/BANDAGES/DRESSINGS) ×1 IMPLANT
BLADE SURG 15 STRL LF DISP TIS (BLADE) ×1 IMPLANT
BLADE SURG 15 STRL SS (BLADE) ×2
CANISTER SUCT 1200ML W/VALVE (MISCELLANEOUS) ×2 IMPLANT
CHLORAPREP W/TINT 26 (MISCELLANEOUS) ×2 IMPLANT
CLIP APPLIE 9.375 MED OPEN (MISCELLANEOUS) IMPLANT
COVER BACK TABLE REUSABLE LG (DRAPES) ×2 IMPLANT
COVER MAYO STAND REUSABLE (DRAPES) ×2 IMPLANT
COVER WAND RF STERILE (DRAPES) IMPLANT
DECANTER SPIKE VIAL GLASS SM (MISCELLANEOUS) ×1 IMPLANT
DERMABOND ADVANCED (GAUZE/BANDAGES/DRESSINGS) ×1
DERMABOND ADVANCED .7 DNX12 (GAUZE/BANDAGES/DRESSINGS) ×1 IMPLANT
DRAPE LAPAROSCOPIC ABDOMINAL (DRAPES) IMPLANT
DRAPE LAPAROTOMY 100X72 PEDS (DRAPES) ×2 IMPLANT
DRAPE UTILITY XL STRL (DRAPES) ×2 IMPLANT
ELECT COATED BLADE 2.86 ST (ELECTRODE) ×2 IMPLANT
ELECT REM PT RETURN 9FT ADLT (ELECTROSURGICAL) ×2
ELECTRODE REM PT RTRN 9FT ADLT (ELECTROSURGICAL) ×1 IMPLANT
GLOVE BIOGEL PI IND STRL 8 (GLOVE) ×1 IMPLANT
GLOVE BIOGEL PI INDICATOR 8 (GLOVE) ×1
GLOVE ECLIPSE 8.0 STRL XLNG CF (GLOVE) ×2 IMPLANT
GOWN STRL REUS W/ TWL LRG LVL3 (GOWN DISPOSABLE) ×2 IMPLANT
GOWN STRL REUS W/TWL LRG LVL3 (GOWN DISPOSABLE) ×6
HEMOSTAT ARISTA ABSORB 3G PWDR (HEMOSTASIS) IMPLANT
KIT MARKER MARGIN INK (KITS) ×1 IMPLANT
NDL HYPO 25X1 1.5 SAFETY (NEEDLE) ×1 IMPLANT
NEEDLE HYPO 25X1 1.5 SAFETY (NEEDLE) ×2 IMPLANT
NS IRRIG 1000ML POUR BTL (IV SOLUTION) ×2 IMPLANT
PACK BASIN DAY SURGERY FS (CUSTOM PROCEDURE TRAY) ×2 IMPLANT
PENCIL SMOKE EVACUATOR (MISCELLANEOUS) ×2 IMPLANT
SLEEVE SCD COMPRESS KNEE MED (MISCELLANEOUS) ×2 IMPLANT
SPONGE LAP 4X18 RFD (DISPOSABLE) ×2 IMPLANT
STAPLER VISISTAT 35W (STAPLE) IMPLANT
SUT MON AB 4-0 PC3 18 (SUTURE) ×2 IMPLANT
SUT SILK 2 0 SH (SUTURE) IMPLANT
SUT VICRYL 3-0 CR8 SH (SUTURE) ×2 IMPLANT
SYR CONTROL 10ML LL (SYRINGE) ×2 IMPLANT
TOWEL GREEN STERILE FF (TOWEL DISPOSABLE) ×4 IMPLANT
TRAY FAXITRON CT DISP (TRAY / TRAY PROCEDURE) IMPLANT
TUBE CONNECTING 20X1/4 (TUBING) ×2 IMPLANT
YANKAUER SUCT BULB TIP NO VENT (SUCTIONS) ×2 IMPLANT

## 2019-12-24 NOTE — H&P (Signed)
Lindsey [name] is an 53 y.o. female.   Chief Complaint: left breast DCIS HPI: pt presents for re excision lumpectomy left breast for focal positive margin DCIS intermediated grade   Past Medical History:  Diagnosis Date  . Allergy   . Anxiety   . Colitis   . Crohn's disease ([place])   . Depression   . Diverticulosis   . Hyperlipidemia   . Hypertension   . Mild intermittent asthma    rarely uses inhaler  . Nephrolithiasis   . PONV (postoperative nausea and vomiting)     Past Surgical History:  Procedure Laterality Date  . BREAST LUMPECTOMY WITH RADIOACTIVE SEED LOCALIZATION Left 12/02/2019   Procedure: LEFT BREAST LUMPECTOMY WITH RADIOACTIVE SEED LOCALIZATION;  Surgeon: Erroll Luna, MD;  Location: [facility];  Service: General;  Laterality: Left;  . COMBINED HYSTEROSCOPY DIAGNOSTIC / D&C    . LEEP    . LITHOTRIPSY    . WISDOM TOOTH EXTRACTION      Family History  Problem Relation Age of Onset  . Dementia Father   . Alzheimer's disease Father   . Hyperlipidemia Other   . Hypertension Other   . Diabetes Other   . Allergic rhinitis Mother   . Food Allergy Mother        peanut  . Asthma Brother   . Crohn's disease Neg Hx   . Esophageal cancer Neg Hx   . Stomach cancer Neg Hx   . Rectal cancer Neg Hx   . Breast cancer Neg Hx   . Angioedema Neg Hx   . Eczema Neg Hx   . Immunodeficiency Neg Hx   . Urticaria Neg Hx    Social History:  reports that she quit smoking about 24 years ago. Her smoking use included cigarettes. She has a 10.00 pack-year smoking history. She has never used smokeless tobacco. She reports current alcohol use. She reports that she does not use drugs.  Allergies:  Allergies  Allergen Reactions  . Azithromycin Hives  . Nitrofurantoin Other (See Comments)    abd pain  . Tamiflu [Oseltamivir Phosphate]     hives    No medications prior to admission.    No results found for this or any previous visit (from the past 48  hour(s)). No results found.  Review of Systems  All other systems reviewed and are negative.   Height 5' 4"  (1.626 m), weight 86.2 kg. Physical Exam  Constitutional: She is oriented to person, place, and time. She appears well-developed.  Eyes: Pupils are equal, round, and reactive to light.  Cardiovascular: Normal rate.  Respiratory: Effort normal.  Left breast incision intact mild irritation   Musculoskeletal:     Cervical back: Normal range of motion.  Neurological: She is alert and oriented to person, place, and time.  Psychiatric: She has a normal mood and affect. Her behavior is normal.     Assessment/Plan Left breast DCIS s/p lumpectomy  With focal positve margin  Pt opted for re excision  The procedure has been discussed with the patient. Alternatives to surgery have been discussed with the patient.  Risks of surgery include bleeding,  Infection,  Seroma formation, death,  and the need for further surgery.   The patient understands and wishes to proceed.  Turner Daniels, MD 12/24/2019, 8:30 AM

## 2019-12-24 NOTE — Anesthesia Preprocedure Evaluation (Signed)
Anesthesia Evaluation  Patient identified by MRN, date of birth, ID band Patient awake    Reviewed: Allergy & Precautions, H&P , NPO status , Patient's Chart, lab work & pertinent test results  History of Anesthesia Complications (+) PONV and history of anesthetic complications  Airway Mallampati: II   Neck ROM: full    Dental   Pulmonary asthma , former smoker,    breath sounds clear to auscultation       Cardiovascular hypertension,  Rhythm:regular Rate:Normal     Neuro/Psych PSYCHIATRIC DISORDERS Anxiety Depression    GI/Hepatic Crohn's dz   Endo/Other    Renal/GU Renal diseasestones     Musculoskeletal   Abdominal   Peds  Hematology   Anesthesia Other Findings   Reproductive/Obstetrics                             Anesthesia Physical Anesthesia Plan  ASA: II  Anesthesia Plan: General   Post-op Pain Management:    Induction: Intravenous  PONV Risk Score and Plan: 4 or greater and Ondansetron, Dexamethasone, Midazolam, Scopolamine patch - Pre-op and Treatment may vary due to age or medical condition  Airway Management Planned: LMA  Additional Equipment:   Intra-op Plan:   Post-operative Plan:   Informed Consent: I have reviewed the patients History and Physical, chart, labs and discussed the procedure including the risks, benefits and alternatives for the proposed anesthesia with the patient or authorized representative who has indicated his/her understanding and acceptance.       Plan Discussed with: CRNA, Anesthesiologist and Surgeon  Anesthesia Plan Comments:         Anesthesia Quick Evaluation

## 2019-12-24 NOTE — Op Note (Signed)
Left Breast Re-excison Lumpectomy Procedure Note  Indications:  This patient returns following an initial lumpectomy.  Analysis of the pathology specimen revealed microscopic involvement of the lateral margins.  The patient now returns for re-excision.The procedure has been discussed with the patient. Alternatives to surgery have been discussed with the patient.  Risks of surgery include bleeding,  Infection,  Seroma formation, death,  and the need for further surgery.   The patient understands and wishes to proceed.  Pre-operative Diagnosis: left breast DCIS   Post-operative Diagnosis: SAME   Surgeon: Turner Daniels MD   Assistants: Malachi Pro PA   Anesthesia: General LMA anesthesia and Local anesthesia 0.25.% bupivacaine  ASA Class: 1  Procedure Details  The patient was seen in the Holding Room. The risks, benefits, complications, treatment options, and expected outcomes were discussed with the patient. The possibilities of reaction to medication, pulmonary aspiration, bleeding, infection, the need for additional procedures, failure to diagnose a condition, and creating a complication requiring transfusion or operation were discussed with the patient. The patient concurred with the proposed plan, giving informed consent.  The site of surgery properly noted/marked. The patient was taken to Operating Room # 8, identified as Keyly Baldonado and the procedure verified as Breast Re-excision Lumpectomy. A Time Out was held and the above information confirmed.  the patient was placed supine.  The breast was prepped and draped in standard fashion. Marcaine 0.25% with epinephrine was used to anesthetize the skin around the previous lumpectomy incision.  The incision LEFT BREAST UPPER OUTER QUADRANT  was opened.  A  seroma was evacuated.  Additional local anesthesia was delivered laterally within the lumpectomy cavity.  A full thickness re-excision was performed. .  The new margin was inked and  the specimen was submitted to pathology.  Hemostasis was achieved with cautery.  Closure was performed in 2 layers with a 3-0 Vicryl and 4 0 monocryl  subcuticular closure.    Dermabond was applied.  At the end of the operation, all sponge, instrument and needle counts were correct.   Findings: grossly clear surgical margins  Estimated Blood Loss:  Minimal                  Total IV Fluids: per anesthesia record          Specimens: as above                   Complications:  None; patient tolerated the procedure well.         Disposition: PACU - hemodynamically stable.         Condition: stable  Attending Attestation: I performed the procedure.

## 2019-12-24 NOTE — Anesthesia Procedure Notes (Signed)
Procedure Name: LMA Insertion Date/Time: 12/24/2019 10:03 AM Performed by: Mariea Clonts, CRNA Pre-anesthesia Checklist: Patient identified, Emergency Drugs available, Suction available and Patient being monitored Patient Re-evaluated:Patient Re-evaluated prior to induction Oxygen Delivery Method: Circle System Utilized Preoxygenation: Pre-oxygenation with 100% oxygen Induction Type: IV induction Ventilation: Mask ventilation without difficulty LMA: LMA inserted LMA Size: 4.0 Number of attempts: 1 Airway Equipment and Method: Bite block Placement Confirmation: positive ETCO2 Tube secured with: Tape Dental Injury: Teeth and Oropharynx as per pre-operative assessment

## 2019-12-24 NOTE — Interval H&P Note (Signed)
History and Physical Interval Note:  12/24/2019 9:38 AM  Lindsey [name]  has presented today for surgery, with the diagnosis of LEFT BREAST DUCTAL CARCINOMA IN SITU.  The various methods of treatment have been discussed with the patient and family. After consideration of risks, benefits and other options for treatment, the patient has consented to  Procedure(s): RE-EXCISION OF LEFT BREAST LUMPECTOMY (Left) as a surgical intervention.  The patient's history has been reviewed, patient examined, no change in status, stable for surgery.  I have reviewed the patient's chart and labs.  Questions were answered to the patient's satisfaction.     [place]

## 2019-12-24 NOTE — Discharge Instructions (Signed)
[facility] °Office Phone Number [phone] ° °BREAST BIOPSY/ PARTIAL MASTECTOMY: POST OP INSTRUCTIONS ° °Always review your discharge instruction sheet given to you by the facility where your surgery was performed. ° °IF YOU HAVE DISABILITY OR FAMILY LEAVE FORMS, YOU MUST BRING THEM TO THE OFFICE FOR PROCESSING.  DO NOT GIVE THEM TO YOUR DOCTOR. ° °1. A prescription for pain medication may be given to you upon discharge.  Take your pain medication as prescribed, if needed.  If narcotic pain medicine is not needed, then you may take acetaminophen (Tylenol) or ibuprofen (Advil) as needed. °2. Take your usually prescribed medications unless otherwise directed °3. If you need a refill on your pain medication, please contact your pharmacy.  They will contact our office to request authorization.  Prescriptions will not be filled after 5pm or on week-ends. °4. You should eat very light the first 24 hours after surgery, such as soup, crackers, pudding, etc.  Resume your normal diet the day after surgery. °5. Most patients will experience some swelling and bruising in the breast.  Ice packs and a good support bra will help.  Swelling and bruising can take several days to resolve.  °6. It is common to experience some constipation if taking pain medication after surgery.  Increasing fluid intake and taking a stool softener will usually help or prevent this problem from occurring.  A mild laxative (Milk of Magnesia or Miralax) should be taken according to package directions if there are no bowel movements after 48 hours. °7. Unless discharge instructions indicate otherwise, you may remove your bandages 24-48 hours after surgery, and you may shower at that time.  You may have steri-strips (small skin tapes) in place directly over the incision.  These strips should be left on the skin for 7-10 days.  If your surgeon used skin glue on the incision, you may shower in 24 hours.  The glue will flake off over the  next 2-3 weeks.  Any sutures or staples will be removed at the office during your follow-up visit. °8. ACTIVITIES:  You may resume regular daily activities (gradually increasing) beginning the next day.  Wearing a good support bra or sports bra minimizes pain and swelling.  You may have sexual intercourse when it is comfortable. °a. You may drive when you no longer are taking prescription pain medication, you can comfortably wear a seatbelt, and you can safely maneuver your car and apply brakes. °b. RETURN TO WORK:  ______________________________________________________________________________________ °9. You should see your doctor in the office for a follow-up appointment approximately two weeks after your surgery.  Your doctor’s nurse will typically make your follow-up appointment when she calls you with your pathology report.  Expect your pathology report 2-3 business days after your surgery.  You may call to check if you do not hear from us after three days. °10. OTHER INSTRUCTIONS: _______________________________________________________________________________________________ _____________________________________________________________________________________________________________________________________ °_____________________________________________________________________________________________________________________________________ °_____________________________________________________________________________________________________________________________________ ° °WHEN TO CALL YOUR DOCTOR: °1. Fever over 101.0 °2. Nausea and/or vomiting. °3. Extreme swelling or bruising. °4. Continued bleeding from incision. °5. Increased pain, redness, or drainage from the incision. ° °The clinic staff is available to answer your questions during regular business hours.  Please don’t hesitate to call and ask to speak to one of the nurses for clinical concerns.  If you have a medical emergency, go to the nearest  emergency room or call 911.  A surgeon from [facility] is always on call at the hospital. ° °For further questions, please visit [email or web address]  ° ° ° ° °  Post Anesthesia Home Care Instructions ° °Activity: °Get plenty of rest for the remainder of the day. A responsible individual must stay with you for 24 hours following the procedure.  °For the next 24 hours, DO NOT: °-Drive a car °-Operate machinery °-Drink alcoholic beverages °-Take any medication unless instructed by your physician °-Make any legal decisions or sign important papers. ° °Meals: °Start with liquid foods such as gelatin or soup. Progress to regular foods as tolerated. Avoid greasy, spicy, heavy foods. If nausea and/or vomiting occur, drink only clear liquids until the nausea and/or vomiting subsides. Call your physician if vomiting continues. ° °Special Instructions/Symptoms: °Your throat may feel dry or sore from the anesthesia or the breathing tube placed in your throat during surgery. If this causes discomfort, gargle with warm salt water. The discomfort should disappear within 24 hours. ° °If you had a scopolamine patch placed behind your ear for the management of post- operative nausea and/or vomiting: ° °1. The medication in the patch is effective for 72 hours, after which it should be removed.  Wrap patch in a tissue and discard in the trash. Wash hands thoroughly with soap and water. °2. You may remove the patch earlier than 72 hours if you experience unpleasant side effects which may include dry mouth, dizziness or visual disturbances. °3. Avoid touching the patch. Wash your hands with soap and water after contact with the patch. °  ° °

## 2019-12-24 NOTE — Transfer of Care (Signed)
Immediate Anesthesia Transfer of Care Note  Patient: Natara Monfort  Procedure(s) Performed: RE-EXCISION OF LEFT BREAST LUMPECTOMY (Left Breast)  Patient Location: PACU  Anesthesia Type:General  Level of Consciousness: awake, alert  and oriented  Airway & Oxygen Therapy: Patient Spontanous Breathing and Patient connected to face mask oxygen  Post-op Assessment: Report given to RN, Post -op Vital signs reviewed and stable and Patient moving all extremities X 4  Post vital signs: Reviewed and stable  Last Vitals:  Vitals Value Taken Time  BP 124/79 12/24/19 1042  Temp    Pulse 75 12/24/19 1044  Resp 20 12/24/19 1044  SpO2 100 % 12/24/19 1044  Vitals shown include unvalidated device data.  Last Pain:  Vitals:   12/24/19 0934  TempSrc: Oral  PainSc: 0-No pain         Complications: No apparent anesthesia complications

## 2019-12-25 LAB — SURGICAL PATHOLOGY

## 2019-12-25 NOTE — Anesthesia Postprocedure Evaluation (Signed)
Anesthesia Post Note  Patient: Lindsey [name]  Procedure(s) Performed: RE-EXCISION OF LEFT BREAST LUMPECTOMY (Left Breast)     Patient location during evaluation: PACU Anesthesia Type: General Level of consciousness: awake and alert Pain management: pain level controlled Vital Signs Assessment: post-procedure vital signs reviewed and stable Respiratory status: spontaneous breathing, nonlabored ventilation, respiratory function stable and patient connected to nasal cannula oxygen Cardiovascular status: blood pressure returned to baseline and stable Postop Assessment: no apparent nausea or vomiting Anesthetic complications: no    Last Vitals:  Vitals:   12/24/19 1115 12/24/19 1200  BP: (!) 153/79 (!) 141/76  Pulse: 83 73  Resp: 11 16  Temp:  36.6 C  SpO2: 100% 97%    Last Pain:  Vitals:   12/24/19 0934  TempSrc: Oral                 Cledis Sohn S

## 2019-12-29 ENCOUNTER — Encounter: Payer: Self-pay | Admitting: *Deleted

## 2020-01-12 ENCOUNTER — Other Ambulatory Visit: Payer: Self-pay

## 2020-01-12 ENCOUNTER — Ambulatory Visit
Admission: RE | Admit: 2020-01-12 | Discharge: 2020-01-12 | Disposition: A | Discharge status: Home / Self Care | Payer: BC Managed Care – PPO | Source: Ambulatory Visit | Attending: Radiation Oncology | Admitting: Radiation Oncology

## 2020-01-12 DIAGNOSIS — Z170 Estrogen receptor positive status [ER+]: Secondary | ICD-10-CM

## 2020-01-12 DIAGNOSIS — D0512 Intraductal carcinoma in situ of left breast: Secondary | ICD-10-CM

## 2020-01-12 DIAGNOSIS — C50412 Malignant neoplasm of upper-outer quadrant of left female breast: Secondary | ICD-10-CM | POA: Insufficient documentation

## 2020-01-12 DIAGNOSIS — Z510 Encounter for antineoplastic radiation therapy: Secondary | ICD-10-CM | POA: Insufficient documentation

## 2020-01-12 LAB — PREGNANCY, URINE: Preg Test, Ur: NEGATIVE

## 2020-01-12 NOTE — Progress Notes (Signed)
Radiation Oncology         [phone] ________________________________  Name: Lindsey [name] MRN: [id number]  Date: 01/12/2020  DOB: 06/23/1966  SIMULATION AND TREATMENT PLANNING NOTE   Special treatment procedure   Outpatient  DIAGNOSIS:     ICD-10-CM   1. Malignant neoplasm of upper-outer quadrant of left breast in female, estrogen receptor positive ([place])  C50.412    Z17.0     NARRATIVE:  The patient was brought to the [facility].  Identity was confirmed.  All relevant records and images related to the planned course of therapy were reviewed.  The patient freely provided informed written consent to proceed with treatment after reviewing the details related to the planned course of therapy. The consent form was witnessed and verified by the simulation staff.    Then, the patient was set-up in a stable reproducible supine position for radiation therapy with her ipsilateral arm over her head, and her upper body secured in a custom-made Vac-lok device.  CT images were obtained.  Surface markings were placed.  The CT images were loaded into the planning software.    Special treatment procedure:  Special treatment procedure was performed today due to the extra time and effort required by myself to plan and prepare this patient for deep inspiration breath hold technique.  I have determined cardiac sparing to be of benefit to this patient to prevent long term cardiac damage due to radiation of the heart.  Bellows were placed on the patient's abdomen. To facilitate cardiac sparing, the patient was coached by the radiation therapists on breath hold techniques and breathing practice was performed. Practice waveforms were obtained. The patient was then scanned while maintaining breath hold in the treatment position.  This image was then transferred over to the imaging specialist. The imaging specialist then created a fusion of the free breathing and breath hold scans using the  chest wall as the stable structure. I personally reviewed the fusion in axial, coronal and sagittal image planes.  Excellent cardiac sparing was obtained.  I felt the patient is an appropriate candidate for breath hold and the patient will be treated as such.  The image fusion was then reviewed with the patient to reinforce the necessity of reproducible breath hold.   TREATMENT PLANNING NOTE: Treatment planning then occurred.  The radiation prescription was entered and confirmed.     A total of 3 medically necessary complex treatment devices were fabricated and supervised by me: 2 fields with MLCs for custom blocks to protect heart, and lungs;  and, a Vac-lok. MORE COMPLEX DEVICES MAY BE MADE IN DOSIMETRY FOR FIELD IN FIELD BEAMS FOR DOSE HOMOGENEITY.  I have requested : 3D Simulation which is medically necessary to give adequate dose to at risk tissues while sparing lungs and heart.  I have requested a DVH of the following structures: lungs, heart, left lumpectomy cavity.    The patient will receive 40.05 Gy in 15 fractions to the left breast with 2 tangential fields.  This will be followed by a boost.  Optical Surface Tracking Plan:  Since intensity modulated radiotherapy (IMRT) and 3D conformal radiation treatment methods are predicated on accurate and precise positioning for treatment, intrafraction motion monitoring is medically necessary to ensure accurate and safe treatment delivery. The ability to quantify intrafraction motion without excessive ionizing radiation dose can only be performed with optical surface tracking. Accordingly, surface imaging offers the opportunity to obtain 3D measurements of patient position throughout IMRT and 3D  treatments without excessive radiation exposure. I am ordering optical surface tracking for this patient's upcoming course of radiotherapy.  ________________________________   Reference:  Ursula Alert, J, et al. Surface imaging-based analysis  of intrafraction motion for breast radiotherapy patients.Journal of [place], n. 6, nov. 2014. ISSN [id number].  Available at: <http://www.jacmp.org/index.php/jacmp/article/view/4957>.    -----------------------------------  Eppie Gibson, MD

## 2020-01-13 ENCOUNTER — Ambulatory Visit: Payer: BC Managed Care – PPO | Admitting: Radiation Oncology

## 2020-01-13 DIAGNOSIS — Z510 Encounter for antineoplastic radiation therapy: Secondary | ICD-10-CM | POA: Diagnosis not present

## 2020-01-21 ENCOUNTER — Other Ambulatory Visit: Payer: Self-pay

## 2020-01-21 ENCOUNTER — Ambulatory Visit
Admission: RE | Admit: 2020-01-21 | Discharge: 2020-01-21 | Disposition: A | Discharge status: Home / Self Care | Payer: BC Managed Care – PPO | Source: Ambulatory Visit | Attending: Radiation Oncology | Admitting: Radiation Oncology

## 2020-01-21 DIAGNOSIS — Z510 Encounter for antineoplastic radiation therapy: Secondary | ICD-10-CM | POA: Diagnosis not present

## 2020-01-22 ENCOUNTER — Other Ambulatory Visit: Payer: Self-pay

## 2020-01-22 ENCOUNTER — Ambulatory Visit
Admission: RE | Admit: 2020-01-22 | Discharge: 2020-01-22 | Disposition: A | Discharge status: Home / Self Care | Payer: BC Managed Care – PPO | Source: Ambulatory Visit | Attending: Radiation Oncology | Admitting: Radiation Oncology

## 2020-01-22 DIAGNOSIS — Z510 Encounter for antineoplastic radiation therapy: Secondary | ICD-10-CM | POA: Diagnosis not present

## 2020-01-23 ENCOUNTER — Ambulatory Visit
Admission: RE | Admit: 2020-01-23 | Discharge: 2020-01-23 | Disposition: A | Discharge status: Home / Self Care | Payer: BC Managed Care – PPO | Source: Ambulatory Visit | Attending: Radiation Oncology | Admitting: Radiation Oncology

## 2020-01-23 ENCOUNTER — Other Ambulatory Visit: Payer: Self-pay

## 2020-01-23 DIAGNOSIS — Z510 Encounter for antineoplastic radiation therapy: Secondary | ICD-10-CM | POA: Diagnosis not present

## 2020-01-26 ENCOUNTER — Other Ambulatory Visit: Payer: Self-pay

## 2020-01-26 ENCOUNTER — Ambulatory Visit
Admission: RE | Admit: 2020-01-26 | Discharge: 2020-01-26 | Disposition: A | Discharge status: Home / Self Care | Payer: BC Managed Care – PPO | Source: Ambulatory Visit | Attending: Radiation Oncology | Admitting: Radiation Oncology

## 2020-01-26 DIAGNOSIS — Z170 Estrogen receptor positive status [ER+]: Secondary | ICD-10-CM

## 2020-01-26 DIAGNOSIS — C50412 Malignant neoplasm of upper-outer quadrant of left female breast: Secondary | ICD-10-CM | POA: Insufficient documentation

## 2020-01-26 DIAGNOSIS — Z510 Encounter for antineoplastic radiation therapy: Secondary | ICD-10-CM | POA: Diagnosis not present

## 2020-01-26 MED ORDER — ALRA NON-METALLIC DEODORANT (RAD-ONC)
1.0000 "application " | Freq: Once | TOPICAL | Status: AC
  Administered 2020-01-26: 1 via TOPICAL

## 2020-01-26 MED ORDER — RADIAPLEXRX EX GEL: Freq: Once | CUTANEOUS | Status: AC

## 2020-01-27 ENCOUNTER — Other Ambulatory Visit: Payer: Self-pay

## 2020-01-27 ENCOUNTER — Ambulatory Visit
Admission: RE | Admit: 2020-01-27 | Discharge: 2020-01-27 | Disposition: A | Discharge status: Home / Self Care | Payer: BC Managed Care – PPO | Source: Ambulatory Visit | Attending: Radiation Oncology | Admitting: Radiation Oncology

## 2020-01-27 DIAGNOSIS — Z510 Encounter for antineoplastic radiation therapy: Secondary | ICD-10-CM | POA: Diagnosis not present

## 2020-01-28 ENCOUNTER — Ambulatory Visit
Admission: RE | Admit: 2020-01-28 | Discharge: 2020-01-28 | Disposition: A | Discharge status: Home / Self Care | Payer: BC Managed Care – PPO | Source: Ambulatory Visit | Attending: Radiation Oncology | Admitting: Radiation Oncology

## 2020-01-28 ENCOUNTER — Other Ambulatory Visit: Payer: Self-pay

## 2020-01-28 DIAGNOSIS — Z510 Encounter for antineoplastic radiation therapy: Secondary | ICD-10-CM | POA: Diagnosis not present

## 2020-01-29 ENCOUNTER — Other Ambulatory Visit: Payer: Self-pay

## 2020-01-29 ENCOUNTER — Ambulatory Visit
Admission: RE | Admit: 2020-01-29 | Discharge: 2020-01-29 | Disposition: A | Discharge status: Home / Self Care | Payer: BC Managed Care – PPO | Source: Ambulatory Visit | Attending: Radiation Oncology | Admitting: Radiation Oncology

## 2020-01-29 DIAGNOSIS — Z510 Encounter for antineoplastic radiation therapy: Secondary | ICD-10-CM | POA: Diagnosis not present

## 2020-01-30 ENCOUNTER — Other Ambulatory Visit: Payer: Self-pay

## 2020-01-30 ENCOUNTER — Ambulatory Visit
Admission: RE | Admit: 2020-01-30 | Discharge: 2020-01-30 | Disposition: A | Discharge status: Home / Self Care | Payer: BC Managed Care – PPO | Source: Ambulatory Visit | Attending: Radiation Oncology | Admitting: Radiation Oncology

## 2020-01-30 DIAGNOSIS — Z510 Encounter for antineoplastic radiation therapy: Secondary | ICD-10-CM | POA: Diagnosis not present

## 2020-01-31 ENCOUNTER — Other Ambulatory Visit: Payer: Self-pay | Admitting: Family Medicine

## 2020-01-31 DIAGNOSIS — F411 Generalized anxiety disorder: Secondary | ICD-10-CM

## 2020-02-01 ENCOUNTER — Emergency Department (HOSPITAL_BASED_OUTPATIENT_CLINIC_OR_DEPARTMENT_OTHER): Payer: BC Managed Care – PPO

## 2020-02-01 ENCOUNTER — Observation Stay (HOSPITAL_BASED_OUTPATIENT_CLINIC_OR_DEPARTMENT_OTHER)
Admission: EM | Admit: 2020-02-01 | Discharge: 2020-02-02 | Disposition: A | Discharge status: Home / Self Care | Payer: BC Managed Care – PPO | Attending: Family Medicine | Admitting: Family Medicine

## 2020-02-01 ENCOUNTER — Other Ambulatory Visit: Payer: Self-pay

## 2020-02-01 ENCOUNTER — Observation Stay (HOSPITAL_COMMUNITY): Payer: BC Managed Care – PPO

## 2020-02-01 ENCOUNTER — Encounter (HOSPITAL_BASED_OUTPATIENT_CLINIC_OR_DEPARTMENT_OTHER): Payer: Self-pay | Admitting: Emergency Medicine

## 2020-02-01 DIAGNOSIS — Z791 Long term (current) use of non-steroidal anti-inflammatories (NSAID): Secondary | ICD-10-CM | POA: Diagnosis not present

## 2020-02-01 DIAGNOSIS — R4182 Altered mental status, unspecified: Secondary | ICD-10-CM | POA: Insufficient documentation

## 2020-02-01 DIAGNOSIS — Z170 Estrogen receptor positive status [ER+]: Secondary | ICD-10-CM | POA: Diagnosis not present

## 2020-02-01 DIAGNOSIS — Z7951 Long term (current) use of inhaled steroids: Secondary | ICD-10-CM | POA: Insufficient documentation

## 2020-02-01 DIAGNOSIS — I10 Essential (primary) hypertension: Secondary | ICD-10-CM | POA: Diagnosis present

## 2020-02-01 DIAGNOSIS — Z20822 Contact with and (suspected) exposure to covid-19: Secondary | ICD-10-CM | POA: Insufficient documentation

## 2020-02-01 DIAGNOSIS — C50412 Malignant neoplasm of upper-outer quadrant of left female breast: Secondary | ICD-10-CM | POA: Diagnosis not present

## 2020-02-01 DIAGNOSIS — K5090 Crohn's disease, unspecified, without complications: Secondary | ICD-10-CM | POA: Diagnosis not present

## 2020-02-01 DIAGNOSIS — Z79899 Other long term (current) drug therapy: Secondary | ICD-10-CM | POA: Diagnosis not present

## 2020-02-01 DIAGNOSIS — G9340 Encephalopathy, unspecified: Secondary | ICD-10-CM | POA: Diagnosis present

## 2020-02-01 DIAGNOSIS — F419 Anxiety disorder, unspecified: Secondary | ICD-10-CM | POA: Insufficient documentation

## 2020-02-01 DIAGNOSIS — G459 Transient cerebral ischemic attack, unspecified: Secondary | ICD-10-CM | POA: Diagnosis present

## 2020-02-01 DIAGNOSIS — F329 Major depressive disorder, single episode, unspecified: Secondary | ICD-10-CM | POA: Insufficient documentation

## 2020-02-01 DIAGNOSIS — R519 Headache, unspecified: Secondary | ICD-10-CM | POA: Diagnosis present

## 2020-02-01 DIAGNOSIS — G454 Transient global amnesia: Secondary | ICD-10-CM | POA: Diagnosis not present

## 2020-02-01 DIAGNOSIS — I63412 Cerebral infarction due to embolism of left middle cerebral artery: Secondary | ICD-10-CM

## 2020-02-01 DIAGNOSIS — E785 Hyperlipidemia, unspecified: Secondary | ICD-10-CM | POA: Diagnosis not present

## 2020-02-01 DIAGNOSIS — Z87891 Personal history of nicotine dependence: Secondary | ICD-10-CM | POA: Diagnosis not present

## 2020-02-01 DIAGNOSIS — R410 Disorientation, unspecified: Secondary | ICD-10-CM | POA: Diagnosis not present

## 2020-02-01 HISTORY — DX: Malignant (primary) neoplasm, unspecified: C80.1

## 2020-02-01 LAB — URINALYSIS, ROUTINE W REFLEX MICROSCOPIC
Bilirubin Urine: NEGATIVE
Glucose, UA: NEGATIVE mg/dL
Ketones, ur: NEGATIVE mg/dL
Leukocytes,Ua: NEGATIVE
Nitrite: NEGATIVE
Protein, ur: NEGATIVE mg/dL
Specific Gravity, Urine: 1.01 (ref 1.005–1.030)
pH: 7.5 (ref 5.0–8.0)

## 2020-02-01 LAB — COMPREHENSIVE METABOLIC PANEL
ALT: 34 U/L (ref 0–44)
AST: 31 U/L (ref 15–41)
Albumin: 4.2 g/dL (ref 3.5–5.0)
Alkaline Phosphatase: 70 U/L (ref 38–126)
Anion gap: 10 (ref 5–15)
BUN: 15 mg/dL (ref 6–20)
CO2: 26 mmol/L (ref 22–32)
Calcium: 9.2 mg/dL (ref 8.9–10.3)
Chloride: 102 mmol/L (ref 98–111)
Creatinine, Ser: 0.64 mg/dL (ref 0.44–1.00)
GFR calc Af Amer: >60 mL/min (ref >60)
GFR calc non Af Amer: >60 mL/min (ref >60)
Glucose, Bld: 124 mg/dL — ABNORMAL HIGH (ref 70–99)
Potassium: 3.5 mmol/L (ref 3.5–5.1)
Sodium: 138 mmol/L (ref 135–145)
Total Bilirubin: 1 mg/dL (ref 0.3–1.2)
Total Protein: 7.8 g/dL (ref 6.5–8.1)

## 2020-02-01 LAB — RAPID URINE DRUG SCREEN, HOSP PERFORMED
Amphetamines: NOT DETECTED
Barbiturates: NOT DETECTED
Benzodiazepines: NOT DETECTED
Cocaine: NOT DETECTED
Opiates: NOT DETECTED
Tetrahydrocannabinol: NOT DETECTED

## 2020-02-01 LAB — DIFFERENTIAL
Abs Immature Granulocytes: 0.02 10*3/uL (ref 0.00–0.07)
Basophils Absolute: 0.1 10*3/uL (ref 0.0–0.1)
Basophils Relative: 1 %
Eosinophils Absolute: 0.3 10*3/uL (ref 0.0–0.5)
Eosinophils Relative: 5 %
Immature Granulocytes: 0 %
Lymphocytes Relative: 31 %
Lymphs Abs: 1.7 10*3/uL (ref 0.7–4.0)
Monocytes Absolute: 0.7 10*3/uL (ref 0.1–1.0)
Monocytes Relative: 13 %
Neutro Abs: 2.9 10*3/uL (ref 1.7–7.7)
Neutrophils Relative %: 50 %

## 2020-02-01 LAB — URINALYSIS, MICROSCOPIC (REFLEX)

## 2020-02-01 LAB — PROTIME-INR
INR: 0.9 (ref 0.8–1.2)
Prothrombin Time: 12.1 seconds (ref 11.4–15.2)

## 2020-02-01 LAB — CBC
HCT: 45.6 % (ref 36.0–46.0)
Hemoglobin: 15.1 g/dL — ABNORMAL HIGH (ref 12.0–15.0)
MCH: 29.7 pg (ref 26.0–34.0)
MCHC: 33.1 g/dL (ref 30.0–36.0)
MCV: 89.6 fL (ref 80.0–100.0)
Platelets: 253 10*3/uL (ref 150–400)
RBC: 5.09 MIL/uL (ref 3.87–5.11)
RDW: 13 % (ref 11.5–15.5)
WBC: 5.6 10*3/uL (ref 4.0–10.5)
nRBC: 0 % (ref 0.0–0.2)

## 2020-02-01 LAB — CBG MONITORING, ED: Glucose-Capillary: 112 mg/dL — ABNORMAL HIGH (ref 70–99)

## 2020-02-01 LAB — RESPIRATORY PANEL BY RT PCR (FLU A&B, COVID)
Influenza A by PCR: NEGATIVE
Influenza B by PCR: NEGATIVE
SARS Coronavirus 2 by RT PCR: NEGATIVE

## 2020-02-01 LAB — PREGNANCY, URINE: Preg Test, Ur: NEGATIVE

## 2020-02-01 LAB — ETHANOL: Alcohol, Ethyl (B): <10 mg/dL (ref <10)

## 2020-02-01 LAB — APTT: aPTT: 26 seconds (ref 24–36)

## 2020-02-01 MED ORDER — SODIUM CHLORIDE 0.9 % IV SOLN
100.0000 mL/h | INTRAVENOUS | Status: DC
  Administered 2020-02-01: 100 mL/h via INTRAVENOUS

## 2020-02-01 MED ORDER — EZETIMIBE 10 MG PO TABS
10.0000 mg | ORAL_TABLET | Freq: Every day | ORAL | Status: DC
  Administered 2020-02-01 – 2020-02-02 (×2): 10 mg via ORAL
  Filled 2020-02-01 (×2): qty 1

## 2020-02-01 MED ORDER — ENOXAPARIN SODIUM 40 MG/0.4ML ~~LOC~~ SOLN
40.0000 mg | SUBCUTANEOUS | Status: DC
  Administered 2020-02-01: 40 mg via SUBCUTANEOUS
  Filled 2020-02-01: qty 0.4

## 2020-02-01 MED ORDER — ACETAMINOPHEN 160 MG/5ML PO SOLN: 650.0000 mg | ORAL | Status: DC | PRN

## 2020-02-01 MED ORDER — ACETAMINOPHEN 650 MG RE SUPP: 650.0000 mg | RECTAL | Status: DC | PRN

## 2020-02-01 MED ORDER — SODIUM CHLORIDE 0.9 % IV SOLN: INTRAVENOUS | Status: DC

## 2020-02-01 MED ORDER — SODIUM CHLORIDE 0.9 % IV BOLUS
500.0000 mL | Freq: Once | INTRAVENOUS | Status: AC
  Administered 2020-02-01: 12:00:00 500 mL via INTRAVENOUS

## 2020-02-01 MED ORDER — KETOROLAC TROMETHAMINE 15 MG/ML IJ SOLN
15.0000 mg | Freq: Once | INTRAMUSCULAR | Status: DC
  Filled 2020-02-01: qty 1

## 2020-02-01 MED ORDER — ASPIRIN 81 MG PO CHEW
81.0000 mg | CHEWABLE_TABLET | Freq: Once | ORAL | Status: AC
  Administered 2020-02-01: 81 mg via ORAL
  Filled 2020-02-01: qty 1

## 2020-02-01 MED ORDER — ALPRAZOLAM 0.5 MG PO TABS: 0.5000 mg | ORAL_TABLET | ORAL | Status: DC | PRN

## 2020-02-01 MED ORDER — STROKE: EARLY STAGES OF RECOVERY BOOK
Freq: Once | Status: AC
  Filled 2020-02-01: qty 1

## 2020-02-01 MED ORDER — ACETAMINOPHEN 325 MG PO TABS
650.0000 mg | ORAL_TABLET | ORAL | Status: DC | PRN
  Administered 2020-02-01: 21:00:00 650 mg via ORAL
  Filled 2020-02-01: qty 2

## 2020-02-01 MED ORDER — MESALAMINE 1.2 G PO TBEC
2.4000 g | DELAYED_RELEASE_TABLET | Freq: Two times a day (BID) | ORAL | Status: DC
  Administered 2020-02-01: 2.4 g via ORAL
  Filled 2020-02-01 (×4): qty 2

## 2020-02-01 MED ORDER — IBUPROFEN 200 MG PO TABS: 800.0000 mg | ORAL_TABLET | Freq: Three times a day (TID) | ORAL | Status: DC | PRN

## 2020-02-01 MED ORDER — ICOSAPENT ETHYL 1 G PO CAPS
2.0000 g | ORAL_CAPSULE | Freq: Two times a day (BID) | ORAL | Status: DC
  Administered 2020-02-02 (×2): 2 g via ORAL
  Filled 2020-02-01 (×4): qty 2

## 2020-02-01 MED ORDER — WHITE PETROLATUM EX OINT
TOPICAL_OINTMENT | CUTANEOUS | Status: AC
  Filled 2020-02-01: qty 28.35

## 2020-02-01 MED ORDER — LORAZEPAM 2 MG/ML IJ SOLN
1.0000 mg | Freq: Once | INTRAMUSCULAR | Status: AC
  Administered 2020-02-01: 1 mg via INTRAVENOUS
  Filled 2020-02-01: qty 1

## 2020-02-01 MED ORDER — LORATADINE 10 MG PO TABS: 10.0000 mg | ORAL_TABLET | Freq: Every day | ORAL | Status: DC

## 2020-02-01 MED ORDER — ASPIRIN 325 MG PO TABS
325.0000 mg | ORAL_TABLET | Freq: Every day | ORAL | Status: DC
  Administered 2020-02-01 – 2020-02-02 (×2): 325 mg via ORAL
  Filled 2020-02-01 (×2): qty 1

## 2020-02-01 MED ORDER — IOHEXOL 350 MG/ML SOLN
100.0000 mL | Freq: Once | INTRAVENOUS | Status: AC | PRN
  Administered 2020-02-01: 13:00:00 100 mL via INTRAVENOUS

## 2020-02-01 MED ORDER — ASPIRIN 300 MG RE SUPP: 300.0000 mg | Freq: Every day | RECTAL | Status: DC

## 2020-02-01 MED ORDER — DIPHENHYDRAMINE HCL 50 MG/ML IJ SOLN
12.5000 mg | Freq: Once | INTRAMUSCULAR | Status: AC
  Administered 2020-02-01: 12.5 mg via INTRAVENOUS
  Filled 2020-02-01: qty 1

## 2020-02-01 MED ORDER — METOCLOPRAMIDE HCL 5 MG/ML IJ SOLN
10.0000 mg | Freq: Once | INTRAMUSCULAR | Status: DC
  Filled 2020-02-01: qty 2

## 2020-02-01 MED ORDER — FLUOXETINE HCL 20 MG PO CAPS
20.0000 mg | ORAL_CAPSULE | Freq: Every day | ORAL | Status: DC
  Administered 2020-02-01 – 2020-02-02 (×2): 20 mg via ORAL
  Filled 2020-02-01 (×2): qty 1

## 2020-02-01 MED ORDER — POTASSIUM CITRATE ER 10 MEQ (1080 MG) PO TBCR
20.0000 meq | EXTENDED_RELEASE_TABLET | Freq: Every day | ORAL | Status: DC
  Administered 2020-02-01 – 2020-02-02 (×2): 20 meq via ORAL
  Filled 2020-02-01 (×3): qty 2

## 2020-02-01 MED ORDER — INDAPAMIDE 1.25 MG PO TABS
1.2500 mg | ORAL_TABLET | Freq: Every day | ORAL | Status: DC
  Administered 2020-02-02: 1.25 mg via ORAL
  Filled 2020-02-01: qty 1

## 2020-02-01 MED ORDER — AMLODIPINE BESYLATE 5 MG PO TABS
5.0000 mg | ORAL_TABLET | Freq: Every day | ORAL | Status: DC
  Administered 2020-02-01 – 2020-02-02 (×2): 5 mg via ORAL
  Filled 2020-02-01 (×2): qty 1
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
## 2020-02-01 NOTE — ED Notes (Signed)
ED Provider at bedside. 

## 2020-02-01 NOTE — Consult Note (Addendum)
[facility] Tele[facility]rology Consult Services   Date of Service:   02/01/2020 11:55:03  Impression:     .  G45.9 - Transient cerebral ischaemic attack, unspecified     .  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 We talked in detail about treatment option. Her NIH stroke scale is 0 and she appears to be at her baseline. Still has some memory issues. I think we dealing with possible transient global amnesia. Also complicated migraine can present in this fashion. Her plain CT is negative. We will do a CT angiogram of the head and neck to rule out any evidence of dissection or intracranial aneurysm. I am going to recommend after that, admitting her for observation with an MRI of the head. Will recommend starting her on aspirin. Depending on the results of the MRI we will plan further care. She should be on telemetry monitoring also.  Metrics: Last Known Well: 02/01/2020 11:00:00 [facility] Notification Time: 02/01/2020 11:55:03 Arrival Time: 02/01/2020 11:40:00 Stamp Time: 02/01/2020 11:55:03 Time First Login Attempt: 02/01/2020 12:01:18 Symptoms: AMS NIHSS Start Assessment Time: 02/01/2020 12:05:36 Patient is not a candidate for Alteplase/Activase. Alteplase Medical Decision: 02/01/2020 12:09:17 Patient was not deemed candidate for Alteplase/Activase thrombolytics because of Resolved symptoms (no residual disabling symptoms).  CT head showed no acute hemorrhage or acute core infarct.  Lower Likelihood of Large Vessel Occlusion but Following Stat Studies are Recommended  CTA Head and Neck. Reviewed, No Indication of Large Vessel Occlusive Thrombus, Patient is not an NIR Candidate.   Our recommendations are outlined below.  Recommendations:     .  Activate Stroke Protocol Admission/Order Set     .  Stroke/Telemetry Floor     .  Neuro Checks     .  Bedside Swallow Eval     .  DVT Prophylaxis     .   IV Fluids, Normal Saline     .  Head of Bed 30 Degrees     .  Euglycemia and Avoid Hyperthermia (PRN Acetaminophen)     .  Initiate Aspirin 81 MG Daily  Routine Consultation with [facility] for Follow up Care  Sign Out:     .  Discussed with Emergency Department Provider    ------------------------------------------------------------------------------  History of Present Illness: Patient is a 53 year old Female.  Patient was brought by private transportation with symptoms of AMS  Extremely pleasant 54 year old female with past medical history of breast cancer, currently getting radiation treatment came to the hospital because of some confusion and memory loss. Also is complaining of a headache. She reports that she was having intercourse with her husband and he started having headaches which is not unusual for her. She reports that this time the headache was very severe and she got confused and disoriented. The husband reports that she could not recall the names of the children or where they were. There was no facial droop. There was no speech or swallowing issues. There was no tonic-clonic jerking or tongue biting. She still has some memory issues but overall appears to be back to her baseline.   Past Medical History:     . Hypertension     . Hyperlipidemia  Anticoagulant use:  No  Antiplatelet use: No    Examination: BP(174/86), Pulse(88), Blood Glucose(112) 1A: Level of Consciousness - Alert; keenly responsive + 0 1B: Ask Month and Age - Both Questions Right + 0 1C: Blink Eyes & Squeeze Hands -  Performs Both Tasks + 0 2: Test Horizontal Extraocular Movements - Normal + 0 3: Test Visual Fields - No Visual Loss + 0 4: Test Facial Palsy (Use Grimace if Obtunded) - Normal symmetry + 0 5A: Test Left Arm Motor Drift - No Drift for 10 Seconds + 0 5B: Test Right Arm Motor Drift - No Drift for 10 Seconds + 0 6A: Test Left Leg Motor Drift - No Drift for 5 Seconds + 0 6B:  Test Right Leg Motor Drift - No Drift for 5 Seconds + 0 7: Test Limb Ataxia (FNF/Heel-Shin) - No Ataxia + 0 8: Test Sensation - Normal; No sensory loss + 0 9: Test Language/Aphasia - Normal; No aphasia + 0 10: Test Dysarthria - Normal + 0 11: Test Extinction/Inattention - No abnormality + 0  NIHSS Score: 0  Pre-Morbid Modified Ranking Scale: 0 Points = No symptoms at all  Patient/Family was informed the Neurology Consult would occur via TeleHealth consult by way of interactive audio and video telecommunications and consented to receiving care in this manner.   Due to the immediate potential for life-threatening deterioration due to underlying acute neurologic illness, I spent 20 minutes providing critical care. This time includes time for face to face visit via telemedicine, review of medical records, imaging studies and discussion of findings with providers, the patient and/or family.   Dr Faustino Congress   [facility] [phone]  Case [id number]  CTA report as below. Suggestive of Connective tissue disease. Will suggest Neurology and Rheumatology followup. No LVO or Aneurysm IMPRESSION: 1. Negative for large vessel occlusion, and no atherosclerosis or significant arterial stenosis identified in the head or neck.  2. But positive for generalized arterial tortuosity, which is severe in the distal cervical ICAs, and associated with some segmental fusiform vessel changes intracranially: bilateral proximal A2 segments, and left MCA M2/M3 segments. The appearance is suspicious for an underlying connective tissue disease. Cerebral Vasculitis is also possible although felt less likely. The vessel changes are not typical of Fibromuscular Dysplasia (FMD).

## 2020-02-01 NOTE — ED Triage Notes (Signed)
Pt arrives with her husband with acute onset of confusion x 40 min ago after sexual intercourse. Pt also c/o severe headache.

## 2020-02-01 NOTE — ED Notes (Signed)
Tele Neuro activated

## 2020-02-01 NOTE — ED Notes (Signed)
Patient transported to CT 
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
## 2020-02-01 NOTE — ED Notes (Signed)
Teleneuro consult in progress

## 2020-02-02 ENCOUNTER — Observation Stay (HOSPITAL_COMMUNITY): Payer: BC Managed Care – PPO

## 2020-02-02 ENCOUNTER — Observation Stay (HOSPITAL_BASED_OUTPATIENT_CLINIC_OR_DEPARTMENT_OTHER): Payer: BC Managed Care – PPO

## 2020-02-02 ENCOUNTER — Other Ambulatory Visit: Payer: Self-pay | Admitting: Neurology

## 2020-02-02 ENCOUNTER — Ambulatory Visit
Admission: RE | Admit: 2020-02-02 | Discharge: 2020-02-02 | Disposition: A | Discharge status: Home / Self Care | Payer: BC Managed Care – PPO | Source: Ambulatory Visit | Attending: Radiation Oncology | Admitting: Radiation Oncology

## 2020-02-02 ENCOUNTER — Ambulatory Visit: Payer: BC Managed Care – PPO

## 2020-02-02 DIAGNOSIS — G9340 Encephalopathy, unspecified: Principal | ICD-10-CM

## 2020-02-02 DIAGNOSIS — I639 Cerebral infarction, unspecified: Secondary | ICD-10-CM

## 2020-02-02 DIAGNOSIS — G459 Transient cerebral ischemic attack, unspecified: Secondary | ICD-10-CM | POA: Diagnosis not present

## 2020-02-02 DIAGNOSIS — G454 Transient global amnesia: Secondary | ICD-10-CM | POA: Diagnosis not present

## 2020-02-02 DIAGNOSIS — I63412 Cerebral infarction due to embolism of left middle cerebral artery: Secondary | ICD-10-CM

## 2020-02-02 DIAGNOSIS — I6340 Cerebral infarction due to embolism of unspecified cerebral artery: Secondary | ICD-10-CM | POA: Insufficient documentation

## 2020-02-02 DIAGNOSIS — I10 Essential (primary) hypertension: Secondary | ICD-10-CM | POA: Diagnosis not present

## 2020-02-02 LAB — ECHOCARDIOGRAM COMPLETE
Height: 67 in
Weight: 3216 oz

## 2020-02-02 LAB — LIPID PANEL
Cholesterol: 205 mg/dL — ABNORMAL HIGH (ref 0–200)
HDL: 60 mg/dL (ref >40)
LDL Cholesterol: 125 mg/dL — ABNORMAL HIGH (ref 0–99)
Total CHOL/HDL Ratio: 3.4 RATIO
Triglycerides: 101 mg/dL (ref <150)
VLDL: 20 mg/dL (ref 0–40)

## 2020-02-02 LAB — HIV ANTIBODY (ROUTINE TESTING W REFLEX): HIV Screen 4th Generation wRfx: NONREACTIVE

## 2020-02-02 LAB — HEMOGLOBIN A1C
Hgb A1c MFr Bld: 5.9 % — ABNORMAL HIGH (ref 4.8–5.6)
Mean Plasma Glucose: 122.63 mg/dL

## 2020-02-02 LAB — SEDIMENTATION RATE: Sed Rate: 5 mm/hr (ref 0–22)

## 2020-02-02 LAB — TSH: TSH: 2.653 u[IU]/mL (ref 0.350–4.500)

## 2020-02-02 LAB — ANTITHROMBIN III: AntiThromb III Func: 100 % (ref 75–120)

## 2020-02-02 LAB — C-REACTIVE PROTEIN: CRP: 0.9 mg/dL (ref <1.0)

## 2020-02-02 LAB — VITAMIN B12: Vitamin B-12: 407 pg/mL (ref 180–914)

## 2020-02-02 MED ORDER — ASPIRIN 81 MG PO TBEC: 81.0000 mg | DELAYED_RELEASE_TABLET | Freq: Every day | ORAL | Status: DC

## 2020-02-02 MED ORDER — ASPIRIN EC 81 MG PO TBEC: 81.0000 mg | DELAYED_RELEASE_TABLET | Freq: Every day | ORAL | Status: DC

## 2020-02-02 MED ORDER — ATORVASTATIN CALCIUM 10 MG PO TABS: 20.0000 mg | ORAL_TABLET | Freq: Every day | ORAL | Status: DC

## 2020-02-02 MED ORDER — ATORVASTATIN CALCIUM 20 MG PO TABS: 20.0000 mg | ORAL_TABLET | Freq: Every day | ORAL | 1 refills | Status: DC

## 2020-02-02 NOTE — Progress Notes (Signed)
EEG complete - results pending 

## 2020-02-02 NOTE — Progress Notes (Addendum)
Pt will schedule LP and cerebral angiogram to rule out CNS vasculitis. Dr. Estanislado Pandy is aware. Will order outpt LP to be done within a week. She will be discharged with ASA 81 and liptor 20. She will follow up with Dr. Leonie Man at [facility].   Rosalin Hawking, MD PhD Stroke Neurology 02/02/2020 12:31 PM

## 2020-02-02 NOTE — Progress Notes (Signed)
  Echocardiogram 2D Echocardiogram has been performed.  Blake Goya G Alexys Lobello 02/02/2020, 11:06 AM

## 2020-02-02 NOTE — Progress Notes (Signed)
PT Cancellation Note  Patient Details Name: Lindsey [name] MRN: [id number] DOB: [birth year]   Cancelled Treatment:    Reason Eval/Treat Not Completed: Medical issues which prohibited therapy(Pt undergoing EEG per nurse. Will return tomorrow. )   Denice Paradise 02/02/2020, 3:31 PM  Omero Kowal W,PT Acute Rehabilitation Services Pager:  [phone]  Office:  [phone]

## 2020-02-02 NOTE — Progress Notes (Signed)
TCD bubble study and bilateral lower extremity venous duplex completed.  Refer to "CV Proc" under chart review to view preliminary results.  02/02/2020 2:06 PM Kelby Aline., MHA, RVT, RDCS, RDMS

## 2020-02-02 NOTE — Progress Notes (Signed)
Patient is scheduled for a radiation treatment at 0800 at [facility]. An order was placed for the patient to be transported to [facility] for treatment. Patient stated she does not want to be transferred, she can just move this day of treatment to the end of her treatment. She stated that missing one day of treatment would not be worth the money to transfer when she is not expected to be inpatient for the duration of the week.

## 2020-02-02 NOTE — Progress Notes (Signed)
Discharged to home after IV access removed and discharge instructions reviewed with pt and husband.

## 2020-02-02 NOTE — Procedures (Signed)
Patient Name: Lindsey [name]  MRN: [id number]  Epilepsy Attending: Lora Havens  Referring Physician/Provider: Dr. Rosalin Hawking Date: 02/02/2020 Duration: 21.74mns  Patient history: 54year old female with history of breast cancer currently undergoing XRT presented with transient alteration of awareness.  EEG to evaluate for seizures.  Level of alertness: Awake, drowsy  AEDs during EEG study: None  Technical aspects: This EEG study was done with scalp electrodes positioned according to the 10-20 International system of electrode placement. Electrical activity was acquired at a sampling rate of 500Hz  and reviewed with a high frequency filter of 70Hz  and a low frequency filter of 1Hz . EEG data were recorded continuously and digitally stored.   Description: The posterior dominant rhythm consists of 10 Hz activity of moderate voltage (25-35 uV) seen predominantly in posterior head regions, symmetric and reactive to eye opening and eye closing.  Drowsiness was characterized by attenuation of the posterior background rhythm. Hyperventilation and photic stimulation were not performed.  IMPRESSION: This study is within normal limits. No seizures or epileptiform discharges were seen throughout the recording.  Erland Vivas OBarbra Sarks

## 2020-02-02 NOTE — Discharge Summary (Signed)
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 washings and  aspirates are unacceptable for Xpert Xpress SARS-CoV-2/FLU/RSV  testing. Fact Sheet for Patients: PinkCheek.be Fact Sheet for Healthcare Providers: GravelBags.it This test is not yet approved or cleared by the [place] FDA and  has been authorized for detection and/or diagnosis of SARS-CoV-2 by  FDA under an Emergency Use Authorization (EUA). This EUA will remain  in effect (meaning this test can be used) for the duration of the  Covid-19 declaration under Section 564(b)(1) of the Act, 21  U.S.C. section 360bbb-3(b)(1), unless the authorization is  terminated or revoked. Performed at [facility], [place] [address] [place], [place] [address]      Labs: Basic Metabolic Panel: Recent Labs  Lab 02/01/20 1202  NA 138   K 3.5  CL 102  CO2 26  GLUCOSE 124*  BUN 15  CREATININE 0.64  CALCIUM 9.2   Liver Function Tests: Recent Labs  Lab 02/01/20 1202  AST 31  ALT 34  ALKPHOS 70  BILITOT 1.0  PROT 7.8  ALBUMIN 4.2   No results for input(s): LIPASE, AMYLASE in the last 168 hours. No results for input(s): AMMONIA in the last 168 hours. CBC: Recent Labs  Lab 02/01/20 1202  WBC 5.6  NEUTROABS 2.9  HGB 15.1*  HCT 45.6  MCV 89.6  PLT 253   Cardiac Enzymes: No results for input(s): CKTOTAL, CKMB, CKMBINDEX, TROPONINI in the last 168 hours. BNP: BNP (last 3 results) No results for input(s): BNP in the last 8760 hours.  ProBNP (last 3 results) No results for input(s): PROBNP in the last 8760 hours.  CBG: Recent Labs  Lab 02/01/20 1145  GLUCAP 112*       Signed:  Radene Gunning NP.  [facility] 02/02/2020, 2:54 PM   Patient was seen, examined,treatment plan was discussed with the Advance Practice Provider.  I have personally reviewed the clinical findings,  labs, EKG, imaging studies and management of this patient in detail. I have also reviewed the orders written for this patient which were under my direction. I agree with the documentation, as recorded by the Advance Practice Provider.   Lindsey [name] is a 54 y.o. female was admitted due to acute encephalopathy of unknown etiology.  CT head, CT angiogram of head and neck were unremarkable. MRI brain impression as below: IMPRESSION: No definite abnormal finding. 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 See axial diffusion images 90 through 93.  Patient seen and examined.  Her symptoms have completely resolved.  General exam: Appears calm and comfortable  Respiratory system: Clear to  auscultation. Respiratory effort normal. Cardiovascular system: S1 & S2 heard, RRR. No JVD, murmurs, rubs, gallops or clicks. No pedal edema. Gastrointestinal system: Abdomen is nondistended, soft and nontender. No organomegaly or masses felt. Normal bowel sounds heard. Central nervous system: Alert and oriented. No focal neurological deficits. Extremities: Symmetric 5 x 5 power. Skin: No rashes, lesions or ulcers.  Psychiatry: Judgement and insight appear normal. Mood & affect appropriate.    Seen by neurology.  Doppler lower extremity and carotid Doppler were obtained and they were unremarkable.  Transthoracic echo done.  Results pending however transcranial Doppler did not show evidence of PFO.  She is scheduled to have LP and cerebral angiogram outpatient with Dr. Estanislado Pandy.  Neurology has cleared her to go home.  She will be discharged on aspirin 81 and Lipitor 20 mg per neurology and will also see Dr. Leonie Man of neurology as outpatient.   Darliss Cheney, MD How to contact the [facility] Attending or Consulting provider [place] or covering provider during after hours [place], for this patient?  1. Check the care team in [facility] and look for a) attending/consulting [facility] provider listed and b) the [facility] team listed 2. Log into [email or web address] and use [place]'s universal password to access. If you do not have the password, please contact the hospital operator. 3. Locate the [facility] provider you are looking for under [facility] and page to a number that you can be directly reached. 4. If you still have difficulty reaching the provider, please page the [facility] (Director on Call) for the Hospitalists listed on amion for assistance.

## 2020-02-02 NOTE — Consult Note (Addendum)
Stroke Neurology Consultation Note  Consult Requested by: [facility], seen by teleneurology 02/01/2020 at [facility]   Reason for Consult: possible stroke  Consult Date:  02/02/20  History of Present Illness:  Lindsey [name] is an 54 y.o. Caucasian female with PMH of breast cancer currently undergoing XRT and Crohn's disease on humira and mesalamine presenting to [place] with confusion and memory loss following a severe HA during sexual intercourse.   Pt can not remember what happened. As per husband, pt in the past had coital HA in the past but this time the HA last longer than normal. She then developed emotional episode of persistent crying that was inconsolable. Pt had asked whereabout of her sons many times repeatedly although that was repeatedly answered by husband every time. She was not able to remember all of these and she only remembered until she had coffee 9am yesterday. During the confusion and memory loss, she was sent to [facility] Seen by teleneurology who found improvement in sypmtoms and NIHSS 0. She did not receive tPA. Overnight, she was back to her baseline. She now remembers what happened before the sexual intercourse but still lack of memory after. MRI showed small cluster of punctate DWI restriction at left frontal lobe, neurology was called for recommendations.   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 She also had crohn's disease diagnosed in 2017 and on humira since 2018, doing well, maintained on humira and mesalamine.   Date last known well: 02/01/2020 at 1100 tPA Given: No: symptom resolution, NIHSS 0 MRS:  0 NIHSS:  0  Past Medical History:  Diagnosis Date  . Allergy   . Anxiety   . Cancer ([place])   . Colitis   . Crohn's disease ([place])   . Depression   . Diverticulosis   . Hyperlipidemia   .  Hypertension   . Mild intermittent asthma    rarely uses inhaler  . Nephrolithiasis   . PONV (postoperative nausea and vomiting)     Past Surgical History:  Procedure Laterality Date  . BREAST LUMPECTOMY WITH RADIOACTIVE SEED LOCALIZATION Left 12/02/2019   Procedure: LEFT BREAST LUMPECTOMY WITH RADIOACTIVE SEED LOCALIZATION;  Surgeon: Erroll Luna, MD;  Location: [place];  Service: General;  Laterality: Left;  . COMBINED HYSTEROSCOPY DIAGNOSTIC / D&C    . LEEP    . LITHOTRIPSY    . RE-EXCISION OF BREAST LUMPECTOMY Left 12/24/2019   Procedure: RE-EXCISION OF LEFT BREAST LUMPECTOMY;  Surgeon: Erroll Luna, MD;  Location: [place];  Service: General;  Laterality: Left;  . WISDOM TOOTH EXTRACTION     Family History  Problem Relation Age of Onset  . Dementia Father   . Alzheimer's disease Father   . Hyperlipidemia Other   . Hypertension Other   . Diabetes Other   . Allergic rhinitis Mother   . Food Allergy Mother        peanut  . Asthma Brother   . Crohn's disease Neg Hx   . Esophageal cancer Neg Hx   . Stomach cancer Neg Hx   . Rectal cancer Neg Hx   . Breast cancer Neg Hx   . Angioedema Neg Hx   . Eczema Neg Hx   . Immunodeficiency Neg Hx   . Urticaria Neg Hx     Social History:  reports that she quit  smoking about 24 years ago. Her smoking use included cigarettes. She has a 10.00 pack-year smoking history. She has never used smokeless tobacco. She reports current alcohol use. She reports that she does not use drugs.  Review of Systems: A full ROS was attempted today and was able to be performed.  Systems assessed include - Constitutional, Eyes, HENT, Respiratory, Cardiovascular, Gastrointestinal, Genitourinary, Integument/breast, Hematologic/lymphatic, Musculoskeletal, Neurological, Behavioral/Psych, Endocrine, Allergic/Immunologic - with pertinent responses as per HPI.  Allergies:  Allergies  Allergen Reactions  . Azithromycin Hives   . Nitrofurantoin Other (See Comments)    abd pain  . Tamiflu [Oseltamivir Phosphate] Hives    Medications:  I have reviewed the patient's current medications. Prior to Admission:  Medications Prior to Admission  Medication Sig Dispense Refill Last Dose  . Adalimumab (HUMIRA PEN) 40 MG/0.4ML PNKT Inject 40 mg into the skin every 14 (fourteen) days. (Patient taking differently: Inject 40 mg into the skin See admin instructions. Inject 40 mg subcutaneously every other Monday night) 2 each 5 01/19/2020  . albuterol (VENTOLIN HFA) 108 (90 Base) MCG/ACT inhaler Inhale 2 puffs into the lungs every 4 (four) hours as needed for wheezing or shortness of breath.   year ago  . amLODipine (NORVASC) 5 MG tablet TAKE ONE (1) TABLET BY MOUTH EVERY DAY (Patient taking differently: Take 5 mg by mouth daily. ) 30 tablet 0 01/31/2020 at am  . desloratadine (CLARINEX) 5 MG tablet Take 5 mg by mouth daily.   4 01/31/2020 at am  . ergocalciferol (VITAMIN D2) 1.25 MG (50000 UT) capsule Take 50,000 Units by mouth every Thursday.    01/22/2020  . ezetimibe (ZETIA) 10 MG tablet Take 1 tablet (10 mg total) by mouth daily. (Patient taking differently: Take 10 mg by mouth at bedtime. ) 90 tablet 3 01/31/2020 at pm  . FLUoxetine (PROZAC) 20 MG capsule TAKE ONE CAPSULE BY MOUTH DAILY (Patient taking differently: Take 20 mg by mouth daily. ) 30 capsule 0 01/31/2020 at am  . fluticasone (FLONASE) 50 MCG/ACT nasal spray Place 1 spray into both nostrils daily.   2 weeks ago  . icosapent Ethyl (VASCEPA) 1 g capsule Take 2 capsules (2 g total) by mouth 2 (two) times daily. 120 capsule 2 01/31/2020 at pm  . indapamide (LOZOL) 1.25 MG tablet Take 1.25 mg by mouth daily.   9 01/31/2020 at am  . levocetirizine (XYZAL) 5 MG tablet Take 1 tablet (5 mg total) by mouth every evening. 30 tablet 0 01/31/2020 at pm  . mesalamine (LIALDA) 1.2 g EC tablet TAKE TWO (2) TABLETS BY MOUTH 2 TIMES DAILY (Patient taking differently: Take 2.4 g by mouth daily. ) 120  tablet 4 01/31/2020 at am  . montelukast (SINGULAIR) 10 MG tablet Take 1 tablet (10 mg total) by mouth at bedtime. 30 tablet 0 01/31/2020 at pm  . OVER THE COUNTER MEDICATION Take 7 drops by mouth 2 (two) times daily. CBD oil   01/31/2020 at pm  . potassium citrate (UROCIT-K) 10 MEQ (1080 MG) SR tablet Take 2 tablets (20 mEq total) by mouth daily. (Patient taking differently: Take 10 mEq by mouth 2 (two) times daily. ) 60 tablet 5 01/31/2020 at pm  . vitamin B-12 (CYANOCOBALAMIN) 1000 MCG tablet Take 1,000 mcg by mouth daily.   01/31/2020 at am  . fluticasone (FLOVENT HFA) 110 MCG/ACT inhaler Inhale 2 puffs into the lungs 2 (two) times daily. (Patient not taking: Reported on 02/01/2020) 1 Inhaler 0 Not Taking at Unknown time  .  ibuprofen (ADVIL) 800 MG tablet Take 1 tablet (800 mg total) by mouth every 8 (eight) hours as needed. (Patient not taking: Reported on 12/16/2019) 30 tablet 0 Not Taking at Unknown time  . ibuprofen (ADVIL) 800 MG tablet Take 1 tablet (800 mg total) by mouth every 8 (eight) hours as needed. (Patient not taking: Reported on 02/01/2020) 30 tablet 0 Not Taking at Unknown time  . oxyCODONE (OXY IR/ROXICODONE) 5 MG immediate release tablet Take 1 tablet (5 mg total) by mouth every 6 (six) hours as needed for severe pain. (Patient not taking: Reported on 12/16/2019) 15 tablet 0 Not Taking at Unknown time    Test Results: CBC:  Recent Labs  Lab 02/01/20 1202  WBC 5.6  NEUTROABS 2.9  HGB 15.1*  HCT 45.6  MCV 89.6  PLT 170   Basic Metabolic Panel:  Recent Labs  Lab 02/01/20 1202  NA 138  K 3.5  CL 102  CO2 26  GLUCOSE 124*  BUN 15  CREATININE 0.64  CALCIUM 9.2   Liver Function Tests: Recent Labs  Lab 02/01/20 1202  AST 31  ALT 34  ALKPHOS 70  BILITOT 1.0  PROT 7.8  ALBUMIN 4.2   Coagulation Studies:  Recent Labs    02/01/20 1202  LABPROT 12.1  INR 0.9   CBG:  Recent Labs  Lab 02/01/20 1145  GLUCAP 112*   Urinalysis:  Recent Labs  Lab 02/01/20 [place]  LABSPEC 1.010  PHURINE 7.5  GLUCOSEU NEGATIVE  HGBUR SMALL*  BILIRUBINUR NEGATIVE  KETONESUR NEGATIVE  PROTEINUR NEGATIVE  NITRITE NEGATIVE  LEUKOCYTESUR NEGATIVE   Microbiology:  Results for orders placed or performed during the hospital encounter of 02/01/20  Respiratory Panel by RT PCR (Flu A&B, Covid) - Nasopharyngeal Swab     Status: None   Collection Time: 02/01/20  2:06 PM   Specimen: Nasopharyngeal Swab  Result Value Ref Range Status   SARS Coronavirus 2 by RT PCR NEGATIVE NEGATIVE Final    Comment: (NOTE) SARS-CoV-2 target nucleic acids are NOT DETECTED. The SARS-CoV-2 RNA is generally detectable in upper respiratoy specimens during the acute phase of infection. The lowest concentration of SARS-CoV-2 viral copies this assay can detect is 131 copies/mL. A negative result does not preclude SARS-Cov-2 infection and should not be used as the sole basis for treatment or other patient management decisions. A negative result may occur with  improper specimen collection/handling, submission of specimen other than nasopharyngeal swab, presence of viral mutation(s) within the areas targeted by this assay, and inadequate number of viral copies (<131 copies/mL). A negative result must be combined with clinical observations, patient history, and epidemiological information. The expected result is Negative. Fact Sheet for Patients:  PinkCheek.be Fact Sheet for Healthcare Providers:  GravelBags.it This test is not yet ap proved or cleared by the [place] FDA and  has been authorized for detection and/or diagnosis of SARS-CoV-2 by FDA under an Emergency Use Authorization (EUA). This EUA will remain  in effect (meaning this test can be used) for the duration of the COVID-19 declaration under Section 564(b)(1) of the Act, 21 U.S.C. section 360bbb-3(b)(1), unless the authorization is terminated  or revoked sooner.    Influenza A by PCR NEGATIVE NEGATIVE Final   Influenza B by PCR NEGATIVE NEGATIVE Final    Comment: (NOTE) The Xpert Xpress SARS-CoV-2/FLU/RSV assay is intended as an aid in  the diagnosis of influenza from Nasopharyngeal swab specimens and  should not be used as a sole  basis for treatment. Nasal washings and  aspirates are unacceptable for Xpert Xpress SARS-CoV-2/FLU/RSV  testing. Fact Sheet for Patients: PinkCheek.be Fact Sheet for Healthcare Providers: GravelBags.it This test is not yet approved or cleared by the [place] FDA and  has been authorized for detection and/or diagnosis of SARS-CoV-2 by  FDA under an Emergency Use Authorization (EUA). This EUA will remain  in effect (meaning this test can be used) for the duration of the  Covid-19 declaration under Section 564(b)(1) of the Act, 21  U.S.C. section 360bbb-3(b)(1), unless the authorization is  terminated or revoked. Performed at [facility], [place] [address], [place],  [address]    Lipid Panel:     Component Value Date/Time   CHOL 205 (H) 02/02/2020 0134   TRIG 101 02/02/2020 0134   HDL 60 02/02/2020 0134   CHOLHDL 3.4 02/02/2020 0134   VLDL 20 02/02/2020 0134   LDLCALC 125 (H) 02/02/2020 0134   HgbA1c:  Lab Results  Component Value Date   HGBA1C 5.9 (H) 02/02/2020   Urine Drug Screen:     Component Value Date/Time   LABOPIA NONE DETECTED 02/01/2020 1247   COCAINSCRNUR NONE DETECTED 02/01/2020 1247   LABBENZ NONE DETECTED 02/01/2020 1247   AMPHETMU NONE DETECTED 02/01/2020 1247   THCU NONE DETECTED 02/01/2020 1247   LABBARB NONE DETECTED 02/01/2020 1247    Alcohol Level:  Recent Labs  Lab 02/01/20 1202  ETH <10    CT ANGIO HEAD W OR WO CONTRAST  Result Date: 02/01/2020 CLINICAL DATA:  54 year old female code stroke presentation with sudden onset confusion. EXAM: CT ANGIOGRAPHY HEAD AND NECK TECHNIQUE:  Multidetector CT imaging of the head and neck was performed using the standard protocol during bolus administration of intravenous contrast. Multiplanar CT image reconstructions and MIPs were obtained to evaluate the vascular anatomy. Carotid stenosis measurements (when applicable) are obtained utilizing NASCET criteria, using the distal internal carotid diameter as the denominator. CONTRAST:  152m OMNIPAQUE IOHEXOL 350 MG/ML SOLN COMPARISON:  Plain head CT 1156 hours today. FINDINGS: CTA NECK Skeleton: Negative. Mild hyperostosis of the calvarium, normal variant. Upper chest: Negative. Visible central pulmonary artery appears patent. Other neck: Level 2 cervical lymph nodes are at the upper limits of normal bilaterally, but other neck soft tissues are within normal limits. No cystic or necrotic nodes identified. Aortic arch: 3 vessel arch configuration with no arch atherosclerosis. Right carotid system: Negative aside from mildly tortuous right CCA and moderately to severely tortuous right ICA just below the skull base. Left carotid system: Negative left CCA and left carotid bifurcation. Moderately to severely tortuous left ICA just below the skull base. Vertebral arteries: Proximal right subclavian artery and right vertebral artery origin are normal. Patent right vertebral artery to the skull base without plaque or stenosis. Proximal left subclavian artery and left vertebral artery origin are normal. Fairly codominant vertebral arteries, the left is slightly larger and patent to the skull base without stenosis. CTA HEAD Posterior circulation: No distal vertebral artery or vertebrobasilar junction stenosis. Patent PICA origins. Patent basilar artery, AICA and SCA origins. Normal basilar tip and PCA origins. Posterior communicating arteries are diminutive or absent. Bilateral PCA branches are within normal limits. Anterior circulation: Both ICA siphons are patent without plaque or stenosis. There is mild bilateral  siphon dolichoectasia. Ophthalmic artery origins appear normal. Patent carotid termini. Normal MCA and ACA origins. Diminutive anterior communicating artery. Mildly ectatic proximal ACA A2 segments (series 14, image 50 and series 16, image 22. Bilateral  ACA branches are within normal limits. Left MCA M1 segment bifurcates early without stenosis. Left MCA branches are tortuous and patent without stenosis but demonstrate mild segmental fusiform enlargement in the distal M2 segments on series 16, image 29. Right MCA M1 segment is tortuous and bifurcates early without stenosis. The right MCA branches are patent with less irregularity than those on the left side, although some mild long segment changes in the distal posterior MCA division on series 16, image 14. Venous sinuses: Patent. Anatomic variants: Slightly dominant left vertebral artery. Review of the MIP images confirms the above findings IMPRESSION: 1. Negative for large vessel occlusion, and no atherosclerosis or significant arterial stenosis identified in the head or neck. 2. But positive for generalized arterial tortuosity, which is severe in the distal cervical ICAs, and associated with some segmental fusiform vessel changes intracranially: bilateral proximal A2 segments, and left MCA M2/M3 segments. The appearance is suspicious for an underlying connective tissue disease. Cerebral Vasculitis is also possible although felt less likely. The vessel changes are not typical of Fibromuscular Dysplasia (FMD). Electronically Signed   By: Genevie Ann M.D.   On: 02/01/2020 13:04   CT ANGIO NECK W OR WO CONTRAST  Result Date: 02/01/2020 CLINICAL DATA:  54 year old female code stroke presentation with sudden onset confusion. EXAM: CT ANGIOGRAPHY HEAD AND NECK TECHNIQUE: Multidetector CT imaging of the head and neck was performed using the standard protocol during bolus administration of intravenous contrast. Multiplanar CT image reconstructions and MIPs were obtained to  evaluate the vascular anatomy. Carotid stenosis measurements (when applicable) are obtained utilizing NASCET criteria, using the distal internal carotid diameter as the denominator. CONTRAST:  139m OMNIPAQUE IOHEXOL 350 MG/ML SOLN COMPARISON:  Plain head CT 1156 hours today. FINDINGS: CTA NECK Skeleton: Negative. Mild hyperostosis of the calvarium, normal variant. Upper chest: Negative. Visible central pulmonary artery appears patent. Other neck: Level 2 cervical lymph nodes are at the upper limits of normal bilaterally, but other neck soft tissues are within normal limits. No cystic or necrotic nodes identified. Aortic arch: 3 vessel arch configuration with no arch atherosclerosis. Right carotid system: Negative aside from mildly tortuous right CCA and moderately to severely tortuous right ICA just below the skull base. Left carotid system: Negative left CCA and left carotid bifurcation. Moderately to severely tortuous left ICA just below the skull base. Vertebral arteries: Proximal right subclavian artery and right vertebral artery origin are normal. Patent right vertebral artery to the skull base without plaque or stenosis. Proximal left subclavian artery and left vertebral artery origin are normal. Fairly codominant vertebral arteries, the left is slightly larger and patent to the skull base without stenosis. CTA HEAD Posterior circulation: No distal vertebral artery or vertebrobasilar junction stenosis. Patent PICA origins. Patent basilar artery, AICA and SCA origins. Normal basilar tip and PCA origins. Posterior communicating arteries are diminutive or absent. Bilateral PCA branches are within normal limits. Anterior circulation: Both ICA siphons are patent without plaque or stenosis. 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 Ophthalmic artery origins appear normal. Patent carotid termini. Normal MCA and ACA origins. Diminutive anterior communicating artery. Mildly ectatic proximal ACA A2 segments  (series 14, image 50 and series 16, image 22. Bilateral ACA branches are within normal limits. Left MCA M1 segment bifurcates early without stenosis. Left MCA branches are tortuous and patent without stenosis but demonstrate mild segmental fusiform enlargement in the distal M2 segments on series 16, image 29. Right MCA M1 segment is tortuous and bifurcates early without stenosis. The right  MCA branches are patent with less irregularity than those on the left side, although some mild long segment changes in the distal posterior MCA division on series 16, image 14. Venous sinuses: Patent. Anatomic variants: Slightly dominant left vertebral artery. Review of the MIP images confirms the above findings IMPRESSION: 1. Negative for large vessel occlusion, and no atherosclerosis or significant arterial stenosis identified in the head or neck. 2. But positive for generalized arterial tortuosity, which is severe in the distal cervical ICAs, and associated with some segmental fusiform vessel changes intracranially: bilateral proximal A2 segments, and left MCA M2/M3 segments. The appearance is suspicious for an underlying connective tissue disease. Cerebral Vasculitis is also possible although felt less likely. The vessel changes are not typical of Fibromuscular Dysplasia (FMD). Electronically Signed   By: Genevie Ann M.D.   On: 02/01/2020 13:04   MR BRAIN WO CONTRAST  Result Date: 02/02/2020 CLINICAL DATA:  Transient ischemic attack. Severe headache and confusion. EXAM: MRI HEAD WITHOUT CONTRAST TECHNIQUE: Multiplanar, multiecho pulse sequences of the brain and surrounding structures were obtained without intravenous contrast. COMPARISON:  CT studies earlier same day. FINDINGS: Brain: Suspicion of a small grouping of punctate foci of restricted diffusion at the left frontoparietal junction as marked by arrows. These could represent a tiny grouping of micro embolic infarctions. No other convincing diffusion abnormality. Other  pulse sequences appear entirely normal. No evidence of old small or large vessel infarction, mass lesion, hemorrhage, hydrocephalus or extra-axial collection. Vascular: Major vessels at the base of the brain show flow. Skull and upper cervical spine: Negative Sinuses/Orbits: Clear/normal Other: None IMPRESSION: No definite abnormal finding. 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 See axial diffusion images 90 through 93. Electronically Signed   By: Nelson Chimes M.D.   On: 02/02/2020 00:48   CT HEAD CODE STROKE WO CONTRAST  Addendum Date: 02/01/2020   ADDENDUM REPORT: 02/01/2020 12:04 ADDENDUM: Study discussed by telephone with Dr. Isla Pence on 02/01/2020 at 1201 hours. Electronically Signed   By: Genevie Ann M.D.   On: 02/01/2020 12:04   Result Date: 02/01/2020 CLINICAL DATA:  Code stroke. 54 year old female with sudden onset confusion. EXAM: CT HEAD WITHOUT CONTRAST TECHNIQUE: Contiguous axial images were obtained from the base of the skull through the vertex without intravenous contrast. COMPARISON:  None. FINDINGS: Brain: Normal cerebral volume. No midline shift, ventriculomegaly, mass effect, evidence of mass lesion, intracranial hemorrhage or evidence of cortically based acute infarction. Gray-white matter differentiation is within normal limits throughout the brain. Vascular: No suspicious intracranial vascular hyperdensity. Skull: Negative. Sinuses/Orbits: Visualized paranasal sinuses and mastoids are clear. Other: Visualized orbits and scalp soft tissues are within normal limits. ASPECTS [facility] Stroke Program Early CT Score) Total score (0-10 with 10 being normal): 10 IMPRESSION: Normal noncontrast CT appearance of the brain.  ASPECTS 10. Electronically Signed: By: Genevie Ann  M.D. On: 02/01/2020 11:58    EKG: normal EKG, normal sinus rhythm.   Physical Examination: Temp:  [97.9 F (36.6 C)-98.8 F (37.1 C)] 98.3 F (36.8 C) (02/08 0817) Pulse Rate:  [67-88] 72 (02/08 0817) Resp:  [14-23] 17 (02/08 0817) BP: (104-146)/(62-93) 142/82 (02/08 0817) SpO2:  [94 %-100 %] 97 % (02/08 0817)  General - Well nourished, well developed, in no apparent distress.  Ophthalmologic - fundi not visualized due  to noncooperation.  Cardiovascular - Regular rate and rhythm.  Mental Status -  Level of arousal and orientation to time, place, and person were intact. Language including expression, naming, repetition, comprehension was assessed and found intact. Attention span and concentration were normal. Fund of Knowledge was assessed and was intact.  Cranial Nerves II - XII - II - Visual field intact OU. III, IV, VI - Extraocular movements intact. V - Facial sensation intact bilaterally. VII - Facial movement intact bilaterally. VIII - Hearing & vestibular intact bilaterally. X - Palate elevates symmetrically. XI - Chin turning & shoulder shrug intact bilaterally. XII - Tongue protrusion intact.  Motor Strength - The patient's strength was normal in all extremities and pronator drift was absent.  Bulk was normal and fasciculations were absent.   Motor Tone - Muscle tone was assessed at the neck and appendages and was normal.  Reflexes - The patient's reflexes were symmetrical in all extremities and she had no pathological reflexes.  Sensory - Light touch, temperature/pinprick were assessed and were symmetrical.    Coordination - The patient had normal movements in the hands and feet with no ataxia or dysmetria.  Tremor was absent.  Gait and Station - deferred.   Assessment:  [name] is a 54 y.o. female with history of breast cancer currently undergoing XRT presenting to [place] with confusion and memory loss that developed 40 mins  following a severe HA during sexual intercourse. She did not receive IV t-PA due to NIHSS 0.   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 Also possible migrainous stroke if work up unrevealing. Low suspicious for cardiac source at this time.   Code Stroke CT head No acute stroke. ASPECTS 10.     CTA head & neck no ELVO. Arterial tortuosity, severe in distal ICAs. some segmental fusiform vessel changes intracranially: bilateral proximal A2 segments, and left MCA M2/M3 segments  MRI  Possible L frontoparietal jxn micro embolic infarcts  LE doppler no DVT   TCD w/ bubble negative for PFO  2D Echo   LDL 125  HgbA1c 5.9   Hypercoagulable and autoimmune labs pending  Pt can not stay for LP given her left breast radiation schedule, will do LP as outpt - scheduled for Wednesday.    Dr. Estanislado Pandy aware for outpt cerebral angiogram   No antithrombotic prior to admission, now on aspirin 81 mg daily. Continue on discharge.  Therapy recommendations:  none  Disposition:  Return home - will follow up with Dr. Leonie Man at [facility]  Hypertension  Stable . Long-term BP goal normotensive  Hyperlipidemia  Home meds:  zetia 10, resumed in hospital  LDL 125, goal < 70  Now on Lipitor 20 as well as zetia  Will not prescribe intensive statin given not convinced for stroke at this time   Continue statin and zetia at discharge  Other Stroke Risk Factors  Former Cigarette smoker, quit 24 y ago  ETOH use, advised to drink no more than 1 drink(s) a day  Obesity, Body mass index is 31.48 kg/m., recommend weight loss, diet and exercise  as  appropriate   Other Active Problems  L Breast cancer s/p lumpectomy w/ re-excision 11/2019 undergoing XRT   Crohn's disease on humira and mesalamine  Hospital day # 1   Neurology will sign off. Please call with questions. Pt will follow up with stroke clinic Dr. Leonie Man at [facility] ASAP. Thanks for the consult.  Rosalin Hawking, MD PhD Stroke Neurology 02/02/2020 2:25 PM  To contact Stroke Continuity provider, please refer to [email or web address] After hours, contact General Neurology

## 2020-02-03 ENCOUNTER — Other Ambulatory Visit: Payer: Self-pay | Admitting: Radiology

## 2020-02-03 ENCOUNTER — Ambulatory Visit
Admission: RE | Admit: 2020-02-03 | Discharge: 2020-02-03 | Disposition: A | Discharge status: Home / Self Care | Payer: BC Managed Care – PPO | Source: Ambulatory Visit | Attending: Radiation Oncology | Admitting: Radiation Oncology

## 2020-02-03 ENCOUNTER — Other Ambulatory Visit: Payer: Self-pay

## 2020-02-03 ENCOUNTER — Other Ambulatory Visit (HOSPITAL_COMMUNITY): Payer: Self-pay | Admitting: Interventional Radiology

## 2020-02-03 DIAGNOSIS — Z510 Encounter for antineoplastic radiation therapy: Secondary | ICD-10-CM | POA: Diagnosis not present

## 2020-02-03 DIAGNOSIS — I63412 Cerebral infarction due to embolism of left middle cerebral artery: Secondary | ICD-10-CM

## 2020-02-03 LAB — HOMOCYSTEINE: Homocysteine: 6.4 umol/L (ref 0.0–14.5)

## 2020-02-03 LAB — LUPUS ANTICOAGULANT PANEL
DRVVT: 25.5 s (ref 0.0–47.0)
PTT Lupus Anticoagulant: 29 s (ref 0.0–51.9)

## 2020-02-03 LAB — SJOGRENS SYNDROME-A EXTRACTABLE NUCLEAR ANTIBODY: SSA (Ro) (ENA) Antibody, IgG: <0.2 AI (ref 0.0–0.9)

## 2020-02-03 LAB — RPR: RPR Ser Ql: NONREACTIVE

## 2020-02-03 LAB — MPO/PR-3 (ANCA) ANTIBODIES
ANCA Proteinase 3: <3.5 U/mL (ref 0.0–3.5)
Myeloperoxidase Abs: <9 U/mL (ref 0.0–9.0)

## 2020-02-03 LAB — ANCA TITERS
Atypical P-ANCA titer: <1:20 {titer}
C-ANCA: <1:20 {titer}
P-ANCA: <1:20 {titer}

## 2020-02-03 LAB — PROTEIN S, TOTAL: Protein S Ag, Total: 84 % (ref 60–150)

## 2020-02-03 LAB — RHEUMATOID FACTOR: Rheumatoid fact SerPl-aCnc: <10 [IU]/mL (ref 0.0–13.9)

## 2020-02-03 LAB — PROTEIN S ACTIVITY: Protein S Activity: 122 % (ref 63–140)

## 2020-02-03 LAB — PROTEIN C ACTIVITY: Protein C Activity: 133 % (ref 73–180)

## 2020-02-03 LAB — ANTINUCLEAR ANTIBODIES, IFA: ANA Ab, IFA: NEGATIVE

## 2020-02-03 LAB — SJOGRENS SYNDROME-B EXTRACTABLE NUCLEAR ANTIBODY: SSB (La) (ENA) Antibody, IgG: <0.2 AI (ref 0.0–0.9)

## 2020-02-03 LAB — ANTI-DNA ANTIBODY, DOUBLE-STRANDED: ds DNA Ab: <1 [IU]/mL (ref 0–9)

## 2020-02-04 ENCOUNTER — Other Ambulatory Visit: Payer: Self-pay | Admitting: Radiology

## 2020-02-04 ENCOUNTER — Other Ambulatory Visit: Payer: Self-pay

## 2020-02-04 ENCOUNTER — Ambulatory Visit
Admit: 2020-02-04 | Discharge: 2020-02-04 | Disposition: A | Discharge status: Home / Self Care | Payer: BC Managed Care – PPO | Attending: Neurology | Admitting: Neurology

## 2020-02-04 ENCOUNTER — Ambulatory Visit
Admission: RE | Admit: 2020-02-04 | Discharge: 2020-02-04 | Disposition: A | Discharge status: Home / Self Care | Payer: BC Managed Care – PPO | Source: Ambulatory Visit | Attending: Radiation Oncology | Admitting: Radiation Oncology

## 2020-02-04 DIAGNOSIS — Z510 Encounter for antineoplastic radiation therapy: Secondary | ICD-10-CM | POA: Diagnosis not present

## 2020-02-04 DIAGNOSIS — I63412 Cerebral infarction due to embolism of left middle cerebral artery: Secondary | ICD-10-CM

## 2020-02-04 LAB — BETA-2-GLYCOPROTEIN I ABS, IGG/M/A
Beta-2 Glyco I IgG: <9 GPI IgG units (ref 0–20)
Beta-2-Glycoprotein I IgA: <9 GPI IgA units (ref 0–25)
Beta-2-Glycoprotein I IgM: <9 GPI IgM units (ref 0–32)

## 2020-02-04 LAB — PROTEIN C, TOTAL: Protein C, Total: 104 % (ref 60–150)

## 2020-02-04 NOTE — Progress Notes (Signed)
Peripheral blood drawn for LP via Right forearm by Brita Romp, RN. Pt tolerated well.

## 2020-02-04 NOTE — Discharge Instructions (Signed)

## 2020-02-05 ENCOUNTER — Ambulatory Visit
Admission: RE | Admit: 2020-02-05 | Discharge: 2020-02-05 | Disposition: A | Discharge status: Home / Self Care | Payer: BC Managed Care – PPO | Source: Ambulatory Visit | Attending: Radiation Oncology | Admitting: Radiation Oncology

## 2020-02-05 ENCOUNTER — Other Ambulatory Visit: Payer: Self-pay

## 2020-02-05 ENCOUNTER — Encounter (HOSPITAL_COMMUNITY): Payer: Self-pay

## 2020-02-05 ENCOUNTER — Ambulatory Visit (HOSPITAL_COMMUNITY)
Admission: RE | Admit: 2020-02-05 | Discharge: 2020-02-05 | Disposition: A | Discharge status: Home / Self Care | Payer: BC Managed Care – PPO | Source: Ambulatory Visit | Attending: Interventional Radiology | Admitting: Interventional Radiology

## 2020-02-05 DIAGNOSIS — Z539 Procedure and treatment not carried out, unspecified reason: Secondary | ICD-10-CM | POA: Insufficient documentation

## 2020-02-05 DIAGNOSIS — K5090 Crohn's disease, unspecified, without complications: Secondary | ICD-10-CM | POA: Diagnosis not present

## 2020-02-05 DIAGNOSIS — Z87891 Personal history of nicotine dependence: Secondary | ICD-10-CM | POA: Diagnosis not present

## 2020-02-05 DIAGNOSIS — F419 Anxiety disorder, unspecified: Secondary | ICD-10-CM | POA: Diagnosis not present

## 2020-02-05 DIAGNOSIS — I10 Essential (primary) hypertension: Secondary | ICD-10-CM | POA: Insufficient documentation

## 2020-02-05 DIAGNOSIS — E785 Hyperlipidemia, unspecified: Secondary | ICD-10-CM | POA: Diagnosis not present

## 2020-02-05 DIAGNOSIS — F329 Major depressive disorder, single episode, unspecified: Secondary | ICD-10-CM | POA: Diagnosis not present

## 2020-02-05 DIAGNOSIS — Z7982 Long term (current) use of aspirin: Secondary | ICD-10-CM | POA: Diagnosis not present

## 2020-02-05 DIAGNOSIS — J45909 Unspecified asthma, uncomplicated: Secondary | ICD-10-CM | POA: Diagnosis not present

## 2020-02-05 DIAGNOSIS — Z510 Encounter for antineoplastic radiation therapy: Secondary | ICD-10-CM | POA: Diagnosis not present

## 2020-02-05 DIAGNOSIS — I63412 Cerebral infarction due to embolism of left middle cerebral artery: Secondary | ICD-10-CM | POA: Diagnosis not present

## 2020-02-05 DIAGNOSIS — Z79899 Other long term (current) drug therapy: Secondary | ICD-10-CM | POA: Diagnosis not present

## 2020-02-05 LAB — CBC
HCT: 44.8 % (ref 36.0–46.0)
Hemoglobin: 14.6 g/dL (ref 12.0–15.0)
MCH: 29.4 pg (ref 26.0–34.0)
MCHC: 32.6 g/dL (ref 30.0–36.0)
MCV: 90.1 fL (ref 80.0–100.0)
Platelets: 266 10*3/uL (ref 150–400)
RBC: 4.97 MIL/uL (ref 3.87–5.11)
RDW: 12.8 % (ref 11.5–15.5)
WBC: 7.3 10*3/uL (ref 4.0–10.5)
nRBC: 0 % (ref 0.0–0.2)

## 2020-02-05 LAB — CARDIOLIPIN ANTIBODIES, IGG, IGM, IGA
Anticardiolipin IgA: <9 APL U/mL (ref 0–11)
Anticardiolipin IgG: <9 GPL U/mL (ref 0–14)
Anticardiolipin IgM: <9 MPL U/mL (ref 0–12)

## 2020-02-05 LAB — BASIC METABOLIC PANEL
Anion gap: 11 (ref 5–15)
BUN: 15 mg/dL (ref 6–20)
CO2: 25 mmol/L (ref 22–32)
Calcium: 9.4 mg/dL (ref 8.9–10.3)
Chloride: 103 mmol/L (ref 98–111)
Creatinine, Ser: 0.68 mg/dL (ref 0.44–1.00)
GFR calc Af Amer: >60 mL/min (ref >60)
GFR calc non Af Amer: >60 mL/min (ref >60)
Glucose, Bld: 130 mg/dL — ABNORMAL HIGH (ref 70–99)
Potassium: 3.4 mmol/L — ABNORMAL LOW (ref 3.5–5.1)
Sodium: 139 mmol/L (ref 135–145)

## 2020-02-05 LAB — PROTIME-INR
INR: 1 (ref 0.8–1.2)
Prothrombin Time: 13.2 seconds (ref 11.4–15.2)

## 2020-02-05 MED ORDER — ACETAMINOPHEN 325 MG PO TABS
ORAL_TABLET | ORAL | Status: AC
  Administered 2020-02-05: 650 mg
  Filled 2020-02-05: qty 2

## 2020-02-05 MED ORDER — SODIUM CHLORIDE 0.9 % IV SOLN: INTRAVENOUS | Status: DC

## 2020-02-05 NOTE — H&P (Signed)
Chief Complaint: Patient was seen in consultation today for TIA, concern for vasculitis   Referring Physician(s): Dr. Erlinda Hong  Supervising Physician: Luanne Bras  Patient Status: [facility] - Out-pt  History of Present Illness: Lindsey [name] is a 54 y.o. female with history of HTN, HLD, anxiety, Crohn's disease, and breast cancer currently undergoing radiation therapy. She was recently admitted to [facility] with TIA symptoms characterized by severe headache after sexual intercourse associated with confusion.  During her hospitalization she underwent work-up for acute encephalopathy of unclear etiology.  She was referred to [facility] at discharge for possible work-up for vasculitis.  She was also referred to radiology for lumbar puncture and underwent procedure yesterday afternoon.   Patient presents to short stay this AM complaining of a 10/10 headache.  She reports that after lumbar puncture yesterday she rested and lay flat at home.  She went to bed at her normal hour with a very mild headache, but awoke at 4AM with a "splitting" headache.  She was able to get up, get ready and go to radiation, however her headache was persistent and continued to worsen. She is currently lying flat which was reduced her headache to a 7/10.  She has been NPO.  She does not take blood thinners.   Past Medical History:  Diagnosis Date  . Allergy   . Anxiety   . Cancer ([place])   . Colitis   . Crohn's disease ([place])   . Depression   . Diverticulosis   . Hyperlipidemia   . Hypertension   . Mild intermittent asthma    rarely uses inhaler  . Nephrolithiasis   . PONV (postoperative nausea and vomiting)     Past Surgical History:  Procedure Laterality Date  . BREAST LUMPECTOMY WITH RADIOACTIVE SEED LOCALIZATION Left 12/02/2019   Procedure: LEFT BREAST LUMPECTOMY WITH RADIOACTIVE SEED LOCALIZATION;  Surgeon: Erroll Luna, MD;  Location: [place];  Service: General;  Laterality: Left;  .  COMBINED HYSTEROSCOPY DIAGNOSTIC / D&C    . LEEP    . LITHOTRIPSY    . RE-EXCISION OF BREAST LUMPECTOMY Left 12/24/2019   Procedure: RE-EXCISION OF LEFT BREAST LUMPECTOMY;  Surgeon: Erroll Luna, MD;  Location: [place];  Service: General;  Laterality: Left;  . WISDOM TOOTH EXTRACTION      Allergies: Azithromycin, Nitrofurantoin, and Tamiflu [oseltamivir phosphate]  Medications: Prior to Admission medications   Medication Sig Start Date End Date Taking? Authorizing Provider  Adalimumab (HUMIRA PEN) 40 MG/0.4ML PNKT Inject 40 mg into the skin every 14 (fourteen) days. Patient taking differently: Inject 40 mg into the skin See admin instructions. Inject 40 mg subcutaneously every other Monday night 09/11/19  Yes Willia Craze, NP  amLODipine (NORVASC) 5 MG tablet TAKE ONE (1) TABLET BY MOUTH EVERY DAY Patient taking differently: Take 5 mg by mouth daily.  10/23/19  Yes Ann Held, DO  aspirin EC 81 MG EC tablet Take 1 tablet (81 mg total) by mouth daily. 02/03/20  Yes Black, Lezlie Octave, NP  atorvastatin (LIPITOR) 20 MG tablet Take 1 tablet (20 mg total) by mouth daily at 6 PM. 02/02/20  Yes Black, Lezlie Octave, NP  desloratadine (CLARINEX) 5 MG tablet Take 5 mg by mouth daily.  08/29/18  Yes [provider]  ergocalciferol (VITAMIN D2) 1.25 MG (50000 UT) capsule Take 50,000 Units by mouth every Thursday.  04/14/19  Yes [provider]  ezetimibe (ZETIA) 10 MG tablet Take 1 tablet (10 mg total) by  mouth daily. Patient taking differently: Take 10 mg by mouth at bedtime.  03/07/19  Yes Roma Schanz R, DO  FLUoxetine (PROZAC) 20 MG capsule TAKE ONE CAPSULE BY MOUTH DAILY 02/03/20  Yes Ann Held, DO  icosapent Ethyl (VASCEPA) 1 g capsule Take 2 capsules (2 g total) by mouth 2 (two) times daily. 11/27/19  Yes Roma Schanz R, DO  indapamide (LOZOL) 1.25 MG tablet Take 1.25 mg by mouth daily.  11/22/17  Yes [provider]    levocetirizine (XYZAL) 5 MG tablet Take 1 tablet (5 mg total) by mouth every evening. 06/04/18  Yes Bardelas, Jose A, MD  mesalamine (LIALDA) 1.2 g EC tablet TAKE TWO (2) TABLETS BY MOUTH 2 TIMES DAILY Patient taking differently: Take 2.4 g by mouth daily.  10/14/19  Yes Pyrtle, Lajuan Lines, MD  montelukast (SINGULAIR) 10 MG tablet Take 1 tablet (10 mg total) by mouth at bedtime. 06/04/18  Yes Bardelas, Jens Som, MD  OVER THE COUNTER MEDICATION Take 7 drops by mouth 2 (two) times daily. CBD oil   Yes [provider]  potassium citrate (UROCIT-K) 10 MEQ (1080 MG) SR tablet Take 2 tablets (20 mEq total) by mouth daily. Patient taking differently: Take 10 mEq by mouth 2 (two) times daily.  11/27/16  Yes Roma Schanz R, DO  vitamin B-12 (CYANOCOBALAMIN) 1000 MCG tablet Take 1,000 mcg by mouth daily.   Yes [provider]  albuterol (VENTOLIN HFA) 108 (90 Base) MCG/ACT inhaler Inhale 2 puffs into the lungs every 4 (four) hours as needed for wheezing or shortness of breath.    [provider]  fluticasone (FLONASE) 50 MCG/ACT nasal spray Place 1 spray into both nostrils daily.    [provider]  fluticasone (FLOVENT HFA) 110 MCG/ACT inhaler Inhale 2 puffs into the lungs 2 (two) times daily. Patient not taking: Reported on 02/01/2020 02/18/19   Debbrah Alar, NP  oxyCODONE (OXY IR/ROXICODONE) 5 MG immediate release tablet Take 1 tablet (5 mg total) by mouth every 6 (six) hours as needed for severe pain. Patient not taking: Reported on 12/16/2019 12/02/19   Erroll Luna, MD     Family History  Problem Relation Age of Onset  . Dementia Father   . Alzheimer's disease Father   . Hyperlipidemia Other   . Hypertension Other   . Diabetes Other   . Allergic rhinitis Mother   . Food Allergy Mother        peanut  . Asthma Brother   . Crohn's disease Neg Hx   . Esophageal cancer Neg Hx   . Stomach cancer Neg Hx   . Rectal cancer Neg Hx   . Breast cancer Neg Hx    . Angioedema Neg Hx   . Eczema Neg Hx   . Immunodeficiency Neg Hx   . Urticaria Neg Hx     Social History   Socioeconomic History  . Marital status: Married    Spouse name: Not on file  . Number of children: Not on file  . Years of education: Not on file  . Highest education level: Not on file  Occupational History  . Not on file  Tobacco Use  . Smoking status: Former Smoker    Packs/day: 1.00    Years: 10.00    Pack years: 10.00    Types: Cigarettes    Quit date: 12/26/1995    Years since quitting: 24.1  . Smokeless tobacco: Never Used  Substance and Sexual Activity  .  Alcohol use: Yes    Comment: occasional wine   . Drug use: No  . Sexual activity: Yes  Other Topics Concern  . Not on file  Social History Narrative   Occupation: Babysit   Married   Regular exercise- yes   Social Determinants of Radio broadcast assistant Strain:   . Difficulty of Paying Living Expenses: Not on file  Food Insecurity:   . Worried About Charity fundraiser in the Last Year: Not on file  . Ran Out of Food in the Last Year: Not on file  Transportation Needs:   . Lack of Transportation (Medical): Not on file  . Lack of Transportation (Non-Medical): Not on file  Physical Activity:   . Days of Exercise per Week: Not on file  . Minutes of Exercise per Session: Not on file  Stress:   . Feeling of Stress : Not on file  Social Connections:   . Frequency of Communication with Friends and Family: Not on file  . Frequency of Social Gatherings with Friends and Family: Not on file  . Attends Religious Services: Not on file  . Active Member of Clubs or Organizations: Not on file  . Attends Archivist Meetings: Not on file  . Marital Status: Not on file     Review of Systems: A 12 point ROS discussed and pertinent positives are indicated in the HPI above.  All other systems are negative.  Review of Systems  Constitutional: Negative for fatigue and fever.  Respiratory:  Negative for cough and shortness of breath.   Cardiovascular: Negative for chest pain.  Gastrointestinal: Negative for abdominal pain.  Musculoskeletal: Negative for back pain.  Neurological: Positive for headaches. Negative for dizziness and weakness.  Psychiatric/Behavioral: Negative for behavioral problems and confusion.    Vital Signs: BP (!) 151/80   Pulse 69   Temp 97.7 F (36.5 C) (Oral)   Resp 16   Ht 5' 4"  (1.626 m)   Wt 195 lb (88.5 kg)   SpO2 100%   BMI 33.47 kg/m   Physical Exam Vitals and nursing note reviewed.  Constitutional:      Appearance: Normal appearance.  HENT:     Mouth/Throat:     Mouth: Mucous membranes are moist.     Pharynx: Oropharynx is clear.  Cardiovascular:     Rate and Rhythm: Normal rate and regular rhythm.  Pulmonary:     Effort: Pulmonary effort is normal. No respiratory distress.     Breath sounds: Normal breath sounds.  Abdominal:     General: Abdomen is flat. There is no distension.     Palpations: Abdomen is soft.  Skin:    General: Skin is warm and dry.  Neurological:     General: No focal deficit present.     Mental Status: She is alert and oriented to person, place, and time. Mental status is at baseline.  Psychiatric:        Mood and Affect: Mood normal.        Behavior: Behavior normal.        Thought Content: Thought content normal.        Judgment: Judgment normal.      Imaging: CT ANGIO HEAD W OR WO CONTRAST  Result Date: 02/01/2020 CLINICAL DATA:  54 year old female code stroke presentation with sudden onset confusion. EXAM: CT ANGIOGRAPHY HEAD AND NECK TECHNIQUE: Multidetector CT imaging of the head and neck was performed using the standard protocol during bolus administration of intravenous  contrast. Multiplanar CT image reconstructions and MIPs were obtained to evaluate the vascular anatomy. Carotid stenosis measurements (when applicable) are obtained utilizing NASCET criteria, using the distal internal carotid  diameter as the denominator. CONTRAST:  143m OMNIPAQUE IOHEXOL 350 MG/ML SOLN COMPARISON:  Plain head CT 1156 hours today. FINDINGS: CTA NECK Skeleton: Negative. Mild hyperostosis of the calvarium, normal variant. Upper chest: Negative. Visible central pulmonary artery appears patent. Other neck: Level 2 cervical lymph nodes are at the upper limits of normal bilaterally, but other neck soft tissues are within normal limits. No cystic or necrotic nodes identified. Aortic arch: 3 vessel arch configuration with no arch atherosclerosis. Right carotid system: Negative aside from mildly tortuous right CCA and moderately to severely tortuous right ICA just below the skull base. Left carotid system: Negative left CCA and left carotid bifurcation. Moderately to severely tortuous left ICA just below the skull base. Vertebral arteries: Proximal right subclavian artery and right vertebral artery origin are normal. Patent right vertebral artery to the skull base without plaque or stenosis. Proximal left subclavian artery and left vertebral artery origin are normal. Fairly codominant vertebral arteries, the left is slightly larger and patent to the skull base without stenosis. CTA HEAD Posterior circulation: No distal vertebral artery or vertebrobasilar junction stenosis. Patent PICA origins. Patent basilar artery, AICA and SCA origins. Normal basilar tip and PCA origins. Posterior communicating arteries are diminutive or absent. Bilateral PCA branches are within normal limits. Anterior circulation: Both ICA siphons are patent without plaque or stenosis. 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 Ophthalmic artery origins appear normal. Patent carotid termini. Normal MCA and ACA origins. Diminutive anterior communicating artery. Mildly ectatic proximal ACA A2 segments (series 14, image 50 and series 16, image 22. Bilateral ACA branches are within normal limits. Left MCA M1 segment bifurcates early without stenosis. Left MCA  branches are tortuous and patent without stenosis but demonstrate mild segmental fusiform enlargement in the distal M2 segments on series 16, image 29. Right MCA M1 segment is tortuous and bifurcates early without stenosis. The right MCA branches are patent with less irregularity than those on the left side, although some mild long segment changes in the distal posterior MCA division on series 16, image 14. Venous sinuses: Patent. Anatomic variants: Slightly dominant left vertebral artery. Review of the MIP images confirms the above findings IMPRESSION: 1. Negative for large vessel occlusion, and no atherosclerosis or significant arterial stenosis identified in the head or neck. 2. But positive for generalized arterial tortuosity, which is severe in the distal cervical ICAs, and associated with some segmental fusiform vessel changes intracranially: bilateral proximal A2 segments, and left MCA M2/M3 segments. The appearance is suspicious for an underlying connective tissue disease. Cerebral Vasculitis is also possible although felt less likely. The vessel changes are not typical of Fibromuscular Dysplasia (FMD). Electronically Signed   By: HGenevie AnnM.D.   On: 02/01/2020 13:04   CT ANGIO NECK W OR WO CONTRAST  Result Date: 02/01/2020 CLINICAL DATA:  54year old female code stroke presentation with sudden onset confusion. EXAM: CT ANGIOGRAPHY HEAD AND NECK TECHNIQUE: Multidetector CT imaging of the head and neck was performed using the standard protocol during bolus administration of intravenous contrast. Multiplanar CT image reconstructions and MIPs were obtained to evaluate the vascular anatomy. Carotid stenosis measurements (when applicable) are obtained utilizing NASCET criteria, using the distal internal carotid diameter as the denominator. CONTRAST:  1062mOMNIPAQUE IOHEXOL 350 MG/ML SOLN COMPARISON:  Plain head CT 1156 hours today. FINDINGS: CTA NECK  Skeleton: Negative. Mild hyperostosis of the calvarium,  normal variant. Upper chest: Negative. Visible central pulmonary artery appears patent. Other neck: Level 2 cervical lymph nodes are at the upper limits of normal bilaterally, but other neck soft tissues are within normal limits. No cystic or necrotic nodes identified. Aortic arch: 3 vessel arch configuration with no arch atherosclerosis. Right carotid system: Negative aside from mildly tortuous right CCA and moderately to severely tortuous right ICA just below the skull base. Left carotid system: Negative left CCA and left carotid bifurcation. Moderately to severely tortuous left ICA just below the skull base. Vertebral arteries: Proximal right subclavian artery and right vertebral artery origin are normal. Patent right vertebral artery to the skull base without plaque or stenosis. Proximal left subclavian artery and left vertebral artery origin are normal. Fairly codominant vertebral arteries, the left is slightly larger and patent to the skull base without stenosis. CTA HEAD Posterior circulation: No distal vertebral artery or vertebrobasilar junction stenosis. Patent PICA origins. Patent basilar artery, AICA and SCA origins. Normal basilar tip and PCA origins. Posterior communicating arteries are diminutive or absent. Bilateral PCA branches are within normal limits. Anterior circulation: Both ICA siphons are patent without plaque or stenosis. 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 Ophthalmic artery origins appear normal. Patent carotid termini. Normal MCA and ACA origins. Diminutive anterior communicating artery. Mildly ectatic proximal ACA A2 segments (series 14, image 50 and series 16, image 22. Bilateral ACA branches are within normal limits. Left MCA M1 segment bifurcates early without stenosis. Left MCA branches are tortuous and patent without stenosis but demonstrate mild segmental fusiform enlargement in the distal M2 segments on series 16, image 29. Right MCA M1 segment is tortuous and bifurcates  early without stenosis. The right MCA branches are patent with less irregularity than those on the left side, although some mild long segment changes in the distal posterior MCA division on series 16, image 14. Venous sinuses: Patent. Anatomic variants: Slightly dominant left vertebral artery. Review of the MIP images confirms the above findings IMPRESSION: 1. Negative for large vessel occlusion, and no atherosclerosis or significant arterial stenosis identified in the head or neck. 2. But positive for generalized arterial tortuosity, which is severe in the distal cervical ICAs, and associated with some segmental fusiform vessel changes intracranially: bilateral proximal A2 segments, and left MCA M2/M3 segments. The appearance is suspicious for an underlying connective tissue disease. Cerebral Vasculitis is also possible although felt less likely. The vessel changes are not typical of Fibromuscular Dysplasia (FMD). Electronically Signed   By: Genevie Ann M.D.   On: 02/01/2020 13:04   MR BRAIN WO CONTRAST  Result Date: 02/02/2020 CLINICAL DATA:  Transient ischemic attack. Severe headache and confusion. EXAM: MRI HEAD WITHOUT CONTRAST TECHNIQUE: Multiplanar, multiecho pulse sequences of the brain and surrounding structures were obtained without intravenous contrast. COMPARISON:  CT studies earlier same day. FINDINGS: Brain: Suspicion of a small grouping of punctate foci of restricted diffusion at the left frontoparietal junction as marked by arrows. These could represent a tiny grouping of micro embolic infarctions. No other convincing diffusion abnormality. Other pulse sequences appear entirely normal. No evidence of old small or large vessel infarction, mass lesion, hemorrhage, hydrocephalus or extra-axial collection. Vascular: Major vessels at the base of the brain show flow. Skull and upper cervical spine: Negative Sinuses/Orbits: Clear/normal Other: None IMPRESSION: No definite abnormal finding. On the axial  diffusion imaging but not clearly confirmed on the coronal diffusion imaging, there is a potential small grouping of  punctate foci of restricted diffusion at the left frontoparietal junction, which could indicate a small grouping of micro embolic infarctions. See axial diffusion images 90 through 93. Electronically Signed   By: Nelson Chimes M.D.   On: 02/02/2020 00:48   EEG adult  Result Date: 02/02/2020 Lora Havens, MD     02/02/2020  3:59 PM Patient Name: Sigrid Schwebach MRN: [id number] Epilepsy Attending: Lora Havens Referring Physician/Provider: Dr. Rosalin Hawking Date: 02/02/2020 Duration: 21.1mns Patient history: 54year old female with history of breast cancer currently undergoing XRT presented with transient alteration of awareness.  EEG to evaluate for seizures. Level of alertness: Awake, drowsy AEDs during EEG study: None Technical aspects: This EEG study was done with scalp electrodes positioned according to the 10-20 International system of electrode placement. Electrical activity was acquired at a sampling rate of 500Hz  and reviewed with a high frequency filter of 70Hz  and a low frequency filter of 1Hz . EEG data were recorded continuously and digitally stored. Description: The posterior dominant rhythm consists of 10 Hz activity of moderate voltage (25-35 uV) seen predominantly in posterior head regions, symmetric and reactive to eye opening and eye closing.  Drowsiness was characterized by attenuation of the posterior background rhythm. Hyperventilation and photic stimulation were not performed. IMPRESSION: This study is within normal limits. No seizures or epileptiform discharges were seen throughout the recording. Priyanka O Yadav   VAS [place]ANS[place]IAL DOPPLER W BUBBLES  Result Date: 02/03/2020  Transcranial Doppler with Bubble Indications: Stroke. Performing Technologist: MMaudry MayhewMHA, RDMS, RVT, RDCS  Examination Guidelines: A complete evaluation includes B-mode imaging,  spectral Doppler, color Doppler, and power Doppler as needed of all accessible portions of each vessel. Bilateral testing is considered an integral part of a complete examination. Limited examinations for reoccurring indications may be performed as noted.  Summary:  A vascular evaluation was performed. The right middle cerebral artery was studied. An IV was inserted into the patient's left Forearm. Verbal informed consent was obtained.  No evidence of HITS (high intensity transient signals) at rest or with Valsalva. Therefore, there is no evidence of clinically significant PFO (patent foramen ovale). Negative TCD Bubble study *See table(s) above for TCD measurements and observations.  Diagnosing physician: PAntony ContrasMD Electronically signed by PAntony ContrasMD on 02/03/2020 at 8:17:43 AM.    Final    ECHOCARDIOGRAM COMPLETE  Result Date: 02/02/2020    ECHOCARDIOGRAM REPORT   Patient Name:   CAMIEL SHARROWEDWARDS Date of Exam: 02/02/2020 Medical Rec #:  [id number]             Height:       67.0 in Accession #:    [id number]            Weight:       201.0 lb Date of Birth:  108-24-1967             BSA:          2.03 m Patient Age:    89years               BP:           142/82 mmHg Patient Gender: F                      HR:           72 bpm. Exam Location:  Inpatient Procedure: 2D Echo, Cardiac Doppler, Color Doppler and Strain Analysis Indications:  TIA 435.9 / G45.9  History:        Patient has no prior history of Echocardiogram examinations.                 Risk Factors:Hypertension and Dyslipidemia. Breast Cancer.  Sonographer:    Jonelle Sidle Dance Referring Phys: [place]  1. Left ventricular ejection fraction, by estimation, is 60 to 65%. The left ventricle has normal function. Left ventricular diastolic parameters are indeterminate. GLS -22.1% (normal range). No regional wall motion abnormalities.  2. Right ventricular systolic function is normal. There is normal pulmonary artery  systolic pressure. The estimated right ventricular systolic pressure is 14.4 mmHg.  3. The aortic valve is tricuspid. No significant regurgitation or stenosis.  4. No significant mitral regurgitation.  5. Normal IVC size. FINDINGS  Left Ventricle: Left ventricular ejection fraction, by estimation, is 60 to 65%. The left ventricle has normal function. The left ventricle has no regional wall motion abnormalities. The left ventricular internal cavity size was normal in size. There is  mildly increased left ventricular hypertrophy. Right Ventricle: The right ventricular size is normal. No increase in right ventricular wall thickness. Right ventricular systolic function is normal. There is normal pulmonary artery systolic pressure. The tricuspid regurgitant velocity is 2.16 m/s, and  with an assumed right atrial pressure of 3 mmHg, the estimated right ventricular systolic pressure is 31.5 mmHg. Left Atrium: Left atrial size was normal in size. Right Atrium: Right atrial size was normal in size. Pericardium: There is no evidence of pericardial effusion. Mitral Valve: The mitral valve is normal in structure and function. There is mild thickening of the mitral valve leaflet(s). Mild mitral annular calcification. No evidence of mitral valve regurgitation. No evidence of mitral valve stenosis. Tricuspid Valve: The tricuspid valve is normal in structure. Tricuspid valve regurgitation is trivial. Aortic Valve: The aortic valve is tricuspid. Aortic valve regurgitation is not visualized. Mild aortic valve sclerosis is present, with no evidence of aortic valve stenosis. Pulmonic Valve: The pulmonic valve was normal in structure. Pulmonic valve regurgitation is not visualized. Aorta: The aortic root is normal in size and structure. Venous: The inferior vena cava is normal in size with greater than 50% respiratory variability, suggesting right atrial pressure of 3 mmHg. IAS/Shunts: No atrial level shunt detected by color flow  Doppler.  LEFT VENTRICLE PLAX 2D LVIDd:         3.60 cm  Diastology LVIDs:         2.60 cm  LV e' lateral:   10.35 cm/s LV PW:         1.30 cm  LV E/e' lateral: 10.2 LV IVS:        1.10 cm  LV e' medial:    6.31 cm/s LVOT diam:     1.90 cm  LV E/e' medial:  16.8 LV SV:         75.14 ml LV SV Index:   14.17 LVOT Area:     2.84 cm  RIGHT VENTRICLE             IVC RV Basal diam:  2.20 cm     IVC diam: 1.40 cm RV S prime:     13.70 cm/s TAPSE (M-mode): 2.5 cm LEFT ATRIUM             Index       RIGHT ATRIUM           Index LA diam:  3.70 cm 1.83 cm/m  RA Area:     16.10 cm LA Vol (A2C):   53.1 ml 26.20 ml/m RA Volume:   35.90 ml  17.71 ml/m LA Vol (A4C):   36.8 ml 18.16 ml/m LA Biplane Vol: 44.1 ml 21.76 ml/m  AORTIC VALVE LVOT Vmax:   115.00 cm/s LVOT Vmean:  77.100 cm/s LVOT VTI:    0.265 m  AORTA Ao Root diam: 3.50 cm Ao Asc diam:  3.20 cm MITRAL VALVE                         TRICUSPID VALVE MV Area (PHT): 2.91 cm              TR Peak grad:   18.7 mmHg MV Decel Time: 261 msec              TR Vmax:        216.00 cm/s MV E velocity: 106.00 cm/s 103 cm/s MV A velocity: 81.20 cm/s  70.3 cm/s SHUNTS MV E/A ratio:  1.31        1.5       Systemic VTI:  0.26 m                                      Systemic Diam: 1.90 cm Loralie Champagne MD Electronically signed by Loralie Champagne MD Signature Date/Time: 02/02/2020/5:09:19 PM    Final    CT HEAD CODE STROKE WO CONTRAST  Addendum Date: 02/01/2020   ADDENDUM REPORT: 02/01/2020 12:04 ADDENDUM: Study discussed by telephone with Dr. Isla Pence on 02/01/2020 at 1201 hours. Electronically Signed   By: Genevie Ann M.D.   On: 02/01/2020 12:04   Result Date: 02/01/2020 CLINICAL DATA:  Code stroke. 54 year old female with sudden onset confusion. EXAM: CT HEAD WITHOUT CONTRAST TECHNIQUE: Contiguous axial images were obtained from the base of the skull through the vertex without intravenous contrast. COMPARISON:  None. FINDINGS: Brain: Normal cerebral volume. No midline shift,  ventriculomegaly, mass effect, evidence of mass lesion, intracranial hemorrhage or evidence of cortically based acute infarction. Gray-white matter differentiation is within normal limits throughout the brain. Vascular: No suspicious intracranial vascular hyperdensity. Skull: Negative. Sinuses/Orbits: Visualized paranasal sinuses and mastoids are clear. Other: Visualized orbits and scalp soft tissues are within normal limits. ASPECTS [facility] Stroke Program Early CT Score) Total score (0-10 with 10 being normal): 10 IMPRESSION: Normal noncontrast CT appearance of the brain.  ASPECTS 10. Electronically Signed: By: Genevie Ann M.D. On: 02/01/2020 11:58   VAS [place] LOWER EXTREMITY VENOUS (DVT)  Result Date: 02/02/2020  Lower Venous DVTStudy Indications: Stroke, and history of breast cancer.  Comparison Study: No prior study Performing Technologist: Maudry Mayhew MHA, RDMS, RVT, RDCS  Examination Guidelines: A complete evaluation includes B-mode imaging, spectral Doppler, color Doppler, and power Doppler as needed of all accessible portions of each vessel. Bilateral testing is considered an integral part of a complete examination. Limited examinations for reoccurring indications may be performed as noted. The reflux portion of the exam is performed with the patient in reverse Trendelenburg.  +---------+---------------+---------+-----------+----------+--------------+ RIGHT    CompressibilityPhasicitySpontaneityPropertiesThrombus Aging +---------+---------------+---------+-----------+----------+--------------+ CFV      Full           Yes      Yes                                 +---------+---------------+---------+-----------+----------+--------------+  SFJ      Full                                                        +---------+---------------+---------+-----------+----------+--------------+ FV Prox  Full                                                         +---------+---------------+---------+-----------+----------+--------------+ FV Mid   Full                                                        +---------+---------------+---------+-----------+----------+--------------+ FV DistalFull                                                        +---------+---------------+---------+-----------+----------+--------------+ PFV      Full                                                        +---------+---------------+---------+-----------+----------+--------------+ POP      Full           Yes      Yes                                 +---------+---------------+---------+-----------+----------+--------------+ PTV      Full                                                        +---------+---------------+---------+-----------+----------+--------------+ PERO     Full                                                        +---------+---------------+---------+-----------+----------+--------------+   +---------+---------------+---------+-----------+----------+--------------+ LEFT     CompressibilityPhasicitySpontaneityPropertiesThrombus Aging +---------+---------------+---------+-----------+----------+--------------+ CFV      Full           Yes      Yes                                 +---------+---------------+---------+-----------+----------+--------------+ SFJ      Full                                                        +---------+---------------+---------+-----------+----------+--------------+  FV Prox  Full                                                        +---------+---------------+---------+-----------+----------+--------------+ FV Mid   Full                                                        +---------+---------------+---------+-----------+----------+--------------+ FV DistalFull                                                         +---------+---------------+---------+-----------+----------+--------------+ PFV      Full                                                        +---------+---------------+---------+-----------+----------+--------------+ POP      Full           Yes      Yes                                 +---------+---------------+---------+-----------+----------+--------------+ PTV      Full                                                        +---------+---------------+---------+-----------+----------+--------------+ PERO     Full                                                        +---------+---------------+---------+-----------+----------+--------------+     Summary: RIGHT: - There is no evidence of deep vein thrombosis in the lower extremity.  - No cystic structure found in the popliteal fossa.  LEFT: - There is no evidence of deep vein thrombosis in the lower extremity.  - No cystic structure found in the popliteal fossa.  *See table(s) above for measurements and observations. Electronically signed by Monica Martinez MD on 02/02/2020 at 4:18:15 PM.    Final    DG FL GUIDED LUMBAR PUNCTURE  Result Date: 02/04/2020 CLINICAL DATA:  Transient memory loss.  Abnormal head MRI. EXAM: DIAGNOSTIC LUMBAR PUNCTURE UNDER FLUOROSCOPIC GUIDANCE FLUOROSCOPY TIME:  16 seconds; 31  uGym2 DAP PROCEDURE: Informed consent was obtained from the patient prior to the procedure, including potential complications of headache, allergy, and pain. With the patient in left lateral decubitus positioning, the lower back was prepped with Betadine. 1% Lidocaine was used for local anesthesia. Lumbar puncture was performed at the L3-4 level using a 20 gauge needle with return of clear colorless  CSF with an opening pressure of 19 cm water. 8.5 ml of CSF were obtained for laboratory studies. The patient tolerated the procedure well and there were no apparent complications. IMPRESSION: 1. Technically successful lumbar puncture  under fluoroscopy Electronically Signed   By: Lucrezia Europe M.D.   On: 02/04/2020 13:04    Labs:  CBC: Recent Labs    09/10/19 1217 11/28/19 1500 02/01/20 1202 02/05/20 0917  WBC 9.7 8.5 5.6 7.3  HGB 14.9 14.6 15.1* 14.6  HCT 45.4 43.9 45.6 44.8  PLT 282.0 271 253 266    COAGS: Recent Labs    02/01/20 1202 02/05/20 0917  INR 0.9 1.0  APTT 26  --     BMP: Recent Labs    03/13/19 1144 11/28/19 1500 02/01/20 1202  NA 137 136 138  K 3.6 3.4* 3.5  CL 99 101 102  CO2 30 24 26   GLUCOSE 100* 150* 124*  BUN 17 15 15   CALCIUM 9.3 9.4 9.2  CREATININE 0.69 0.53 0.64  GFRNONAA  --  >60 >60  GFRAA  --  >60 >60    LIVER FUNCTION TESTS: Recent Labs    03/13/19 1144 11/28/19 1500 02/01/20 1202  BILITOT 0.6 0.8 1.0  AST 25 26 31   ALT 25 32 34  ALKPHOS 58 56 70  PROT 7.0 6.9 7.8  ALBUMIN 4.2 4.0 4.2    TUMOR MARKERS: No results for input(s): AFPTM, CEA, CA199, CHROMGRNA in the last 8760 hours.  Assessment and Plan: Patient with past medical history of headache, global anemia, possible TIA presents for diagnostic angiogram at the request of Dr. Erlinda Hong. Case reviewed by Dr. Estanislado Pandy who approves patient for procedure.  Unfortunately, patient presents today with a severe headache, most likely due to lumbar puncture yesterday.  She was immediately laid supine in short stay, started on 75 mL/hr NS.  Also given 650 mg Tylenol. Lying flat instantly reduced her headache to a 7/10, and prolonged rest has reduced it further to 4/10.   Dr. Estanislado Pandy met with patient at beside in short stay.  Hopeful that Tylenol will also further reduce her headache.  Given strict instructions for rest/lying flat for several hours today.  Tylenol/ibuprofen as needed.  She can call the imaging center if not improved in the next 24-49 hours.  Angiogram cancelled today.  Patient to call and reschedule once she is recovered.    Thank you for this interesting consult.  I greatly enjoyed meeting  Akshitha Culmer and look forward to participating in their care.  A copy of this report was sent to the requesting provider on this date.  Electronically Signed: Docia Barrier, PA 02/05/2020, 10:24 AM   I spent a total of  30 Minutes   in face to face in clinical consultation, greater than 50% of which was counseling/coordinating care for TIA.

## 2020-02-05 NOTE — Progress Notes (Signed)
Continues to remian flat o2 placed at 2 liters, now rates pain as a 7.

## 2020-02-05 NOTE — Progress Notes (Signed)
Pt to be cancelled today Dr Estanislado Pandy in to speak with pt

## 2020-02-05 NOTE — Discharge Instructions (Addendum)
Radial Site Care  This sheet gives you information about how to care for yourself after your procedure. Your health care provider may also give you more specific instructions. If you have problems or questions, contact your health care provider. What can I expect after the procedure? After the procedure, it is common to have:  Bruising and tenderness at the catheter insertion area. Follow these instructions at home: Medicines  Take over-the-counter and prescription medicines only as told by your health care provider. Insertion site care  Follow instructions from your health care provider about how to take care of your insertion site. Make sure you: ? Wash your hands with soap and water before you change your bandage (dressing). If soap and water are not available, use hand sanitizer. ? Change your dressing as told by your health care provider. ? Leave stitches (sutures), skin glue, or adhesive strips in place. These skin closures may need to stay in place for 2 weeks or longer. If adhesive strip edges start to loosen and curl up, you may trim the loose edges. Do not remove adhesive strips completely unless your health care provider tells you to do that.  Check your insertion site every day for signs of infection. Check for: ? Redness, swelling, or pain. ? Fluid or blood. ? Pus or a bad smell. ? Warmth.  Do not take baths, swim, or use a hot tub until your health care provider approves.  You may shower 24-48 hours after the procedure, or as directed by your health care provider. ? Remove the dressing and gently wash the site with plain soap and water. ? Pat the area dry with a clean towel. ? Do not rub the site. That could cause bleeding.  Do not apply powder or lotion to the site. Activity   For 24 hours after the procedure, or as directed by your health care provider: ? Do not flex or bend the affected arm. ? Do not push or pull heavy objects with the affected arm. ? Do not  drive yourself home from the hospital or clinic. You may drive 24 hours after the procedure unless your health care provider tells you not to. ? Do not operate machinery or power tools.  Do not lift anything that is heavier than 10 lb (4.5 kg), or the limit that you are told, until your health care provider says that it is safe.  Ask your health care provider when it is okay to: ? Return to work or school. ? Resume usual physical activities or sports. ? Resume sexual activity. General instructions  If the catheter site starts to bleed, raise your arm and put firm pressure on the site. If the bleeding does not stop, get help right away. This is a medical emergency.  If you went home on the same day as your procedure, a responsible adult should be with you for the first 24 hours after you arrive home.  Keep all follow-up visits as told by your health care provider. This is important. Contact a health care provider if:  You have a fever.  You have redness, swelling, or yellow drainage around your insertion site. Get help right away if:  You have unusual pain at the radial site.  The catheter insertion area swells very fast.  The insertion area is bleeding, and the bleeding does not stop when you hold steady pressure on the area.  Your arm or hand becomes pale, cool, tingly, or numb. These symptoms may represent a serious problem  that is an emergency. Do not wait to see if the symptoms will go away. Get medical help right away. Call your local emergency services (911 in the [place] Do not drive yourself to the hospital. Summary  After the procedure, it is common to have bruising and tenderness at the site.  Follow instructions from your health care provider about how to take care of your radial site wound. Check the wound every day for signs of infection.  Do not lift anything that is heavier than 10 lb (4.5 kg), or the limit that you are told, until your health care provider says  that it is safe. This information is not intended to replace advice given to you by your health care provider. Make sure you discuss any questions you have with your health care provider. Document Revised: 01/16/2018 Document Reviewed: 01/16/2018 Elsevier Patient Education  2020 [place] [employer] This sheet gives you information about how to care for yourself after your procedure. Your doctor may also give you more specific instructions. If you have problems or questions, contact your doctor. Follow these instructions at home: Insertion site care  Follow instructions from your doctor about how to take care of your long, thin tube (catheter) insertion area. Make sure you: ? Wash your hands with soap and water before you change your bandage (dressing). If you cannot use soap and water, use hand sanitizer. ? Change your bandage as told by your doctor. ? Leave stitches (sutures), skin glue, or skin tape (adhesive) strips in place. They may need to stay in place for 2 weeks or longer. If tape strips get loose and curl up, you may trim the loose edges. Do not remove tape strips completely unless your doctor says it is okay.  Do not take baths, swim, or use a hot tub until your doctor says it is okay.  You may shower 24-48 hours after the procedure or as told by your doctor. ? Gently wash the area with plain soap and water. ? Pat the area dry with a clean towel. ? Do not rub the area. This may cause bleeding.  Do not apply powder or lotion to the area. Keep the area clean and dry.  Check your insertion area every day for signs of infection. Check for: ? More redness, swelling, or pain. ? Fluid or blood. ? Warmth. ? Pus or a bad smell. Activity  Rest as told by your doctor, usually for 1-2 days.  Do not lift anything that is heavier than 10 lbs. (4.5 kg) or as told by your doctor.  Do not drive for 24 hours if you were given a medicine to help you relax (sedative).  Do  not drive or use heavy machinery while taking prescription pain medicine. General instructions   Go back to your normal activities as told by your doctor, usually in about a week. Ask your doctor what activities are safe for you.  If the insertion area starts to bleed, lie flat and put pressure on the area. If the bleeding does not stop, get help right away. This is an emergency.  Drink enough fluid to keep your pee (urine) clear or pale yellow.  Take over-the-counter and prescription medicines only as told by your doctor.  Keep all follow-up visits as told by your doctor. This is important. Contact a doctor if:  You have a fever.  You have chills.  You have more redness, swelling, or pain around your insertion area.  You have fluid  or blood coming from your insertion area.  The insertion area feels warm to the touch.  You have pus or a bad smell coming from your insertion area.  You have more bruising around the insertion area.  Blood collects in the tissue around the insertion area (hematoma) that may be painful to the touch. Get help right away if:  You have a lot of pain in the insertion area.  The insertion area swells very fast.  The insertion area is bleeding, and the bleeding does not stop after holding steady pressure on the area.  The area near or just beyond the insertion area becomes pale, cool, tingly, or numb. These symptoms may be an emergency. Do not wait to see if the symptoms will go away. Get medical help right away. Call your local emergency services (911 in the [place] Do not drive yourself to the hospital. Summary  After the procedure, it is common to have bruising and tenderness at the long, thin tube insertion area.  After the procedure, it is important to rest and drink plenty of fluids.  Do not take baths, swim, or use a hot tub until your doctor says it is okay to do so. You may shower 24-48 hours after the procedure or as told by your  doctor.  If the insertion area starts to bleed, lie flat and put pressure on the area. If the bleeding does not stop, get help right away. This is an emergency. This information is not intended to replace advice given to you by your health care provider. Make sure you discuss any questions you have with your health care provider. Document Revised: 11/23/2017 Document Reviewed: 12/05/2016 Elsevier Patient Education  [place]. Moderate Conscious Sedation, Adult Sedation is the use of medicines to promote relaxation and relieve discomfort and anxiety. Moderate conscious sedation is a type of sedation. Under moderate conscious sedation, you are less alert than normal, but you are still able to respond to instructions, touch, or both. Moderate conscious sedation is used during short medical and dental procedures. It is milder than deep sedation, which is a type of sedation under which you cannot be easily woken up. It is also milder than general anesthesia, which is the use of medicines to make you unconscious. Moderate conscious sedation allows you to return to your regular activities sooner. Tell a health care provider about:  Any allergies you have.  All medicines you are taking, including vitamins, herbs, eye drops, creams, and over-the-counter medicines.  Use of steroids (by mouth or creams).  Any problems you or family members have had with sedatives and anesthetic medicines.  Any blood disorders you have.  Any surgeries you have had.  Any medical conditions you have, such as sleep apnea.  Whether you are pregnant or may be pregnant.  Any use of cigarettes, alcohol, marijuana, or street drugs. What are the risks? Generally, this is a safe procedure. However, problems may occur, including:  Getting too much medicine (oversedation).  Nausea.  Allergic reaction to medicines.  Trouble breathing. If this happens, a breathing tube may be used to help with breathing. It will  be removed when you are awake and breathing on your own.  Heart trouble.  Lung trouble. What happens before the procedure? Staying hydrated Follow instructions from your health care provider about hydration, which may include:  Up to 2 hours before the procedure - you may continue to drink clear liquids, such as water, clear fruit juice, black coffee, and plain  tea. Eating and drinking restrictions Follow instructions from your health care provider about eating and drinking, which may include:  8 hours before the procedure - stop eating heavy meals or foods such as meat, fried foods, or fatty foods.  6 hours before the procedure - stop eating light meals or foods, such as toast or cereal.  6 hours before the procedure - stop drinking milk or drinks that contain milk.  2 hours before the procedure - stop drinking clear liquids. Medicine Ask your health care provider about:  Changing or stopping your regular medicines. This is especially important if you are taking diabetes medicines or blood thinners.  Taking medicines such as aspirin and ibuprofen. These medicines can thin your blood. Do not take these medicines before your procedure if your health care provider instructs you not to.  Tests and exams  You will have a physical exam.  You may have blood tests done to show: ? How well your kidneys and liver are working. ? How well your blood can clot. General instructions  Plan to have someone take you home from the hospital or clinic.  If you will be going home right after the procedure, plan to have someone with you for 24 hours. What happens during the procedure?  An IV tube will be inserted into one of your veins.  Medicine to help you relax (sedative) will be given through the IV tube.  The medical or dental procedure will be performed. What happens after the procedure?  Your blood pressure, heart rate, breathing rate, and blood oxygen level will be monitored often  until the medicines you were given have worn off.  Do not drive for 24 hours. This information is not intended to replace advice given to you by your health care provider. Make sure you discuss any questions you have with your health care provider. Document Revised: 11/23/2017 Document Reviewed: 04/01/2016 Elsevier Patient Education  2020 [employer].

## 2020-02-05 NOTE — Progress Notes (Addendum)
Pt states she feels much better ambulated in hallway to void tol well d/c via wheelchair rates pain as a 3

## 2020-02-05 NOTE — Progress Notes (Addendum)
Pt comes in today c/o headache starting at 0100 states she needs to be flat. Rates headache as a 10 She states she had a lumbar puncture yesterday, radiation today. She states she almost passed out today during radiation. Assisted to bed placed flat on her back vs taken 70-100% 151/80, Ascencion Dike, PA called and informed.

## 2020-02-06 ENCOUNTER — Other Ambulatory Visit: Payer: Self-pay

## 2020-02-06 ENCOUNTER — Ambulatory Visit
Admission: RE | Admit: 2020-02-06 | Discharge: 2020-02-06 | Disposition: A | Discharge status: Home / Self Care | Payer: BC Managed Care – PPO | Source: Ambulatory Visit | Attending: Neurology | Admitting: Neurology

## 2020-02-06 ENCOUNTER — Other Ambulatory Visit: Payer: Self-pay | Admitting: Neurology

## 2020-02-06 ENCOUNTER — Other Ambulatory Visit: Payer: Self-pay | Admitting: Sports Medicine

## 2020-02-06 ENCOUNTER — Ambulatory Visit
Admission: RE | Admit: 2020-02-06 | Discharge: 2020-02-06 | Disposition: A | Discharge status: Home / Self Care | Payer: BC Managed Care – PPO | Source: Ambulatory Visit | Attending: Radiation Oncology | Admitting: Radiation Oncology

## 2020-02-06 DIAGNOSIS — G971 Other reaction to spinal and lumbar puncture: Secondary | ICD-10-CM

## 2020-02-06 DIAGNOSIS — Z510 Encounter for antineoplastic radiation therapy: Secondary | ICD-10-CM | POA: Diagnosis not present

## 2020-02-06 LAB — PROTHROMBIN GENE MUTATION

## 2020-02-06 MED ORDER — IOPAMIDOL (ISOVUE-M 200) INJECTION 41%
1.0000 mL | Freq: Once | INTRAMUSCULAR | Status: AC
  Administered 2020-02-06: 1 mL via EPIDURAL

## 2020-02-06 NOTE — Progress Notes (Signed)
Called in by patient that she experience HA after the LP. She stated that she was lying down all night after LP on Wednesday. She went to radiation yesterday morning still felt HA after sitting up. While lying down, she was OK. She also came to [facility] for cerebral angiogram but did not have it done due to HA. She was given IVF and she drank coffee and fluid at home. She laid down pretty much all day yesterday but still has HA sitting up this morning with radiation. She can tolerate for 74mn of sitting up but after that the HA is not tolerable. She denies any fever, neck pain, LOC or confusion. Some nausea from HA but once lying down no symptoms. She talked to GWinifredand recommend to have blood patch down. I have put in order, and she is scheduled to have it done this morning.   She will reschedule for cerebral angio once HA resolves. I also let her know that her CSF result is negative so far. She expressed appreciation and understanding.   JRosalin Hawking MD PhD Stroke Neurology 02/06/2020 8:49 AM

## 2020-02-06 NOTE — Discharge Instructions (Signed)

## 2020-02-06 NOTE — Progress Notes (Signed)
20cc of blood drawn from pts right hand for Blood patch procedure. Pt tolerated well.

## 2020-02-08 ENCOUNTER — Telehealth (HOSPITAL_COMMUNITY): Payer: Self-pay | Admitting: Radiology

## 2020-02-08 NOTE — Telephone Encounter (Signed)
Called pt to reschedule angio w/ Deveshwar. Call dropped and unable to reconnect. Will try later. JM

## 2020-02-09 ENCOUNTER — Ambulatory Visit
Admission: RE | Admit: 2020-02-09 | Discharge: 2020-02-09 | Disposition: A | Discharge status: Home / Self Care | Payer: BC Managed Care – PPO | Source: Ambulatory Visit | Attending: Radiation Oncology | Admitting: Radiation Oncology

## 2020-02-09 ENCOUNTER — Other Ambulatory Visit: Payer: Self-pay

## 2020-02-09 ENCOUNTER — Ambulatory Visit: Payer: BC Managed Care – PPO | Admitting: Family Medicine

## 2020-02-09 ENCOUNTER — Other Ambulatory Visit: Payer: Self-pay | Admitting: Student

## 2020-02-09 ENCOUNTER — Encounter: Payer: Self-pay | Admitting: Family Medicine

## 2020-02-09 ENCOUNTER — Ambulatory Visit: Payer: BC Managed Care – PPO | Admitting: Radiation Oncology

## 2020-02-09 VITALS — BP 130/82 | HR 89 | Temp 97.3°F | Ht 64.0 in | Wt 202.4 lb

## 2020-02-09 DIAGNOSIS — F418 Other specified anxiety disorders: Secondary | ICD-10-CM

## 2020-02-09 DIAGNOSIS — Z170 Estrogen receptor positive status [ER+]: Secondary | ICD-10-CM

## 2020-02-09 DIAGNOSIS — E559 Vitamin D deficiency, unspecified: Secondary | ICD-10-CM

## 2020-02-09 DIAGNOSIS — G459 Transient cerebral ischemic attack, unspecified: Secondary | ICD-10-CM

## 2020-02-09 DIAGNOSIS — I10 Essential (primary) hypertension: Secondary | ICD-10-CM

## 2020-02-09 DIAGNOSIS — E785 Hyperlipidemia, unspecified: Secondary | ICD-10-CM | POA: Diagnosis not present

## 2020-02-09 DIAGNOSIS — C50412 Malignant neoplasm of upper-outer quadrant of left female breast: Secondary | ICD-10-CM

## 2020-02-09 DIAGNOSIS — Z510 Encounter for antineoplastic radiation therapy: Secondary | ICD-10-CM | POA: Diagnosis not present

## 2020-02-09 LAB — FACTOR 5 LEIDEN

## 2020-02-09 MED ORDER — FLUOXETINE HCL 40 MG PO CAPS: 40.0000 mg | ORAL_CAPSULE | Freq: Every day | ORAL | 3 refills | Status: DC

## 2020-02-09 NOTE — Assessment & Plan Note (Signed)
?   Or transient global amnesia Angiogram tomorrow LP done a few days ago  F/u neuro

## 2020-02-09 NOTE — Assessment & Plan Note (Signed)
Well controlled, no changes to meds. Encouraged heart healthy diet such as the DASH diet and exercise as tolerated.  °

## 2020-02-09 NOTE — Progress Notes (Signed)
..  6.+9Patient ID: Lindsey [name], female    DOB: 10-28-1966  Age: 53 y.o. MRN: [id number]    Subjective:  Subjective  HPI2/7 Lindsey [name] presents for f/u hosp d/c  2/7-2/8-- ? TIA vs Transient global amnesia.  She had an LP as an outpt with have angiogram tomorrow.    Review of Systems  Constitutional: Negative for appetite change, diaphoresis, fatigue and unexpected weight change.  Eyes: Negative for pain, redness and visual disturbance.  Respiratory: Negative for cough, chest tightness, shortness of breath and wheezing.   Cardiovascular: Negative for chest pain, palpitations and leg swelling.  Endocrine: Negative for cold intolerance, heat intolerance, polydipsia, polyphagia and polyuria.  Genitourinary: Negative for difficulty urinating, dysuria and frequency.  Neurological: Negative for dizziness, light-headedness, numbness and headaches.    History Past Medical History:  Diagnosis Date  . Allergy   . Anxiety   . Cancer ([place])   . Colitis   . Crohn's disease ([place])   . Depression   . Diverticulosis   . Hyperlipidemia   . Hypertension   . Mild intermittent asthma    rarely uses inhaler  . Nephrolithiasis   . PONV (postoperative nausea and vomiting)     She has a past surgical history that includes LEEP; Lithotripsy; Wisdom tooth extraction; Combined hysteroscopy diagnostic / D&C; Breast lumpectomy with radioactive seed localization (Left, 12/02/2019); and Re-excision of breast lumpectomy (Left, 12/24/2019).   Her family history includes Allergic rhinitis in her mother; Alzheimer's disease in her father; Asthma in her brother; Dementia in her father; Diabetes in an other family member; Food Allergy in her mother; Hyperlipidemia in an other family member; Hypertension in an other family member.She reports that she quit smoking about 24 years ago. Her smoking use included cigarettes. She has a 10.00 pack-year smoking history. She has never used smokeless  tobacco. She reports current alcohol use. She reports that she does not use drugs.  Current Outpatient Medications on File Prior to Visit  Medication Sig Dispense Refill  . Adalimumab (HUMIRA PEN) 40 MG/0.4ML PNKT Inject 40 mg into the skin every 14 (fourteen) days. (Patient taking differently: Inject 40 mg into the skin See admin instructions. Inject 40 mg subcutaneously every other Monday night) 2 each 5  . albuterol (VENTOLIN HFA) 108 (90 Base) MCG/ACT inhaler Inhale 2 puffs into the lungs every 4 (four) hours as needed for wheezing or shortness of breath.    Marland Kitchen amLODipine (NORVASC) 5 MG tablet TAKE ONE (1) TABLET BY MOUTH EVERY DAY (Patient taking differently: Take 5 mg by mouth daily. ) 30 tablet 0  . aspirin EC 81 MG EC tablet Take 1 tablet (81 mg total) by mouth daily.    Marland Kitchen atorvastatin (LIPITOR) 20 MG tablet Take 1 tablet (20 mg total) by mouth daily at 6 PM. 30 tablet 1  . desloratadine (CLARINEX) 5 MG tablet Take 5 mg by mouth daily.   4  . ergocalciferol (VITAMIN D2) 1.25 MG (50000 UT) capsule Take 50,000 Units by mouth every Thursday.     . ezetimibe (ZETIA) 10 MG tablet Take 1 tablet (10 mg total) by mouth daily. (Patient taking differently: Take 10 mg by mouth at bedtime. ) 90 tablet 3  . fluticasone (FLOVENT HFA) 110 MCG/ACT inhaler Inhale 2 puffs into the lungs 2 (two) times daily. 1 Inhaler 0  . icosapent Ethyl (VASCEPA) 1 g capsule Take 2 capsules (2 g total) by mouth 2 (two) times daily. 120 capsule 2  . indapamide (LOZOL) 1.25  MG tablet Take 1.25 mg by mouth daily.   9  . levocetirizine (XYZAL) 5 MG tablet Take 1 tablet (5 mg total) by mouth every evening. 30 tablet 0  . mesalamine (LIALDA) 1.2 g EC tablet TAKE TWO (2) TABLETS BY MOUTH 2 TIMES DAILY (Patient taking differently: Take 2.4 g by mouth daily. ) 120 tablet 4  . montelukast (SINGULAIR) 10 MG tablet Take 1 tablet (10 mg total) by mouth at bedtime. 30 tablet 0  . OVER THE COUNTER MEDICATION Take 7 drops by mouth 2 (two)  times daily. CBD oil    . vitamin B-12 (CYANOCOBALAMIN) 1000 MCG tablet Take 1,000 mcg by mouth daily.    . fluticasone (FLONASE) 50 MCG/ACT nasal spray Place 1 spray into both nostrils daily.     No current facility-administered medications on file prior to visit.     Objective:  Objective  Physical Exam Vitals and nursing note reviewed.  Constitutional:      Appearance: She is well-developed.  HENT:     Head: Normocephalic and atraumatic.  Eyes:     Conjunctiva/sclera: Conjunctivae normal.  Neck:     Thyroid: No thyromegaly.     Vascular: No carotid bruit or JVD.  Cardiovascular:     Rate and Rhythm: Normal rate and regular rhythm.     Heart sounds: Normal heart sounds. No murmur.  Pulmonary:     Effort: Pulmonary effort is normal. No respiratory distress.     Breath sounds: Normal breath sounds. No wheezing or rales.  Chest:     Chest wall: No tenderness.  Musculoskeletal:        General: No tenderness.     Cervical back: Normal range of motion and neck supple.     Right lower leg: No edema.     Left lower leg: No edema.  Neurological:     General: No focal deficit present.     Mental Status: She is alert and oriented to person, place, and time.     Motor: No weakness.     Coordination: Coordination normal.     Gait: Gait normal.     Deep Tendon Reflexes: Reflexes normal.    BP 130/82 (BP Location: Left Arm, Patient Position: Sitting, Cuff Size: Large)   Pulse 89   Temp (!) 97.3 F (36.3 C) (Temporal)   Ht 5' 4"  (1.626 m)   Wt 202 lb 6 oz (91.8 kg)   SpO2 97%   BMI 34.74 kg/m  Wt Readings from Last 3 Encounters:  02/09/20 202 lb 6 oz (91.8 kg)  02/05/20 195 lb (88.5 kg)  02/01/20 201 lb (91.2 kg)     Lab Results  Component Value Date   WBC 7.3 02/05/2020   HGB 14.6 02/05/2020   HCT 44.8 02/05/2020   PLT 266 02/05/2020   GLUCOSE 130 (H) 02/05/2020   CHOL 205 (H) 02/02/2020   TRIG 101 02/02/2020   HDL 60 02/02/2020   LDLDIRECT 123.2 12/20/2011    LDLCALC 125 (H) 02/02/2020   ALT 34 02/01/2020   AST 31 02/01/2020   NA 139 02/05/2020   K 3.4 (L) 02/05/2020   CL 103 02/05/2020   CREATININE 0.68 02/05/2020   BUN 15 02/05/2020   CO2 25 02/05/2020   TSH 2.653 02/02/2020   INR 1.0 02/05/2020   HGBA1C 5.9 (H) 02/02/2020    No results found.   Assessment & Plan:  Plan  I have discontinued Adaja A. Komperda's potassium citrate, oxyCODONE, and FLUoxetine. I am also  having her start on FLUoxetine. Additionally, I am having her maintain her indapamide, vitamin B-12, montelukast, levocetirizine, desloratadine, fluticasone, ezetimibe, Humira Pen, mesalamine, amLODipine, Vascepa, ergocalciferol, albuterol, fluticasone, OVER THE COUNTER MEDICATION, aspirin, and atorvastatin.  Meds ordered this encounter  Medications  . FLUoxetine (PROZAC) 40 MG capsule    Sig: Take 1 capsule (40 mg total) by mouth daily.    Dispense:  90 capsule    Refill:  3    Problem List Items Addressed This Visit      Unprioritized   Essential hypertension    Well controlled, no changes to meds. Encouraged heart healthy diet such as the DASH diet and exercise as tolerated.       Hyperlipidemia   Hyperlipidemia LDL goal <100    Encouraged heart healthy diet, increase exercise, avoid trans fats, consider a krill oil cap daily      Malignant neoplasm of upper-outer quadrant of left breast in female, estrogen receptor positive ([place])    Per onc       TIA (transient ischemic attack)    ? Or transient global amnesia Angiogram tomorrow LP done a few days ago  F/u neuro        Other Visit Diagnoses    Depression with anxiety    -  Primary   Relevant Medications   FLUoxetine (PROZAC) 40 MG capsule   Vitamin D deficiency          Follow-up: Return in about 3 months (around 05/08/2020) for hypertension, hyperlipidemia.  Ann Held, DO

## 2020-02-09 NOTE — Assessment & Plan Note (Signed)
Per onc

## 2020-02-09 NOTE — Assessment & Plan Note (Signed)
Encouraged heart healthy diet, increase exercise, avoid trans fats, consider a krill oil cap daily 

## 2020-02-09 NOTE — Patient Instructions (Signed)
Transient Ischemic Attack A transient ischemic attack (TIA) is a "warning stroke" that causes stroke-like symptoms that then go away quickly. The symptoms of a TIA come on suddenly, and they last less than 24 hours. Unlike a stroke, a TIA does not cause permanent damage to the brain. It is important to know the symptoms of a TIA and what to do. Seek medical care right away, even if your symptoms go away. Having a TIA is a sign that you are at higher risk for a permanent stroke. Lifestyle changes and medical treatments can help prevent a stroke. What are the causes? This condition is caused by a temporary blockage in an artery in the head or neck. The blockage does not allow the brain to get the blood supply it needs and can cause various symptoms. The blockage can be caused by:  Fatty buildup in an artery in the head or neck (atherosclerosis).  A blood clot.  Tearing of an artery (dissection).  Inflammation of an artery (vasculitis). Sometimes the cause is not known. What increases the risk? Certain factors may make you more likely to develop this condition. Some of these factors are things that you can change, such as:  Obesity.  Using products that contain nicotine or tobacco, such as cigarettes and e-cigarettes.  Taking oral birth control, especially if you also use tobacco.  Lack of physical activity.  Excessive use of alcohol.  Use of drugs, especially cocaine and methamphetamine. Other risk factors include:  High blood pressure (hypertension).  High cholesterol.  Diabetes mellitus.  Heart disease (coronary artery disease).  Atrial fibrillation.  Being over the age of 80.  Being female.  Family history of stroke.  Previous history of blood clots, stroke, TIA, or heart attack.  Sickle cell disease.  Being a woman with a history of preeclampsia.  Migraine headache.  Sleep apnea.  Chronic inflammatory diseases, such as rheumatoid arthritis or lupus.  Blood  clotting disorders (hypercoagulable state). What are the signs or symptoms? Symptoms of this condition are the same as those of a stroke, but they are temporary. The symptoms develop suddenly, and they go away quickly, usually within minutes to hours. Symptoms may include sudden:  Weakness or numbness in your face, arm, or leg, especially on one side of your body.  Trouble walking or difficulty moving your arms or legs.  Trouble speaking, understanding speech, or both (aphasia).  Vision changes in one or both eyes. These include double vision, blurred vision, or loss of vision.  Dizziness.  Confusion.  Loss of balance or coordination.  Nausea and vomiting.  Severe headache with no known cause. If possible, make note of the exact time that you last felt like your normal self and what time your symptoms started. Tell your health care provider. How is this diagnosed? This condition may be diagnosed based on:  Your symptoms and medical history.  A physical exam.  Imaging tests, usually a CT or MRI scan of the brain.  Blood tests. You may also have other tests, including:  Electrocardiogram (ECG).  Echocardiogram.  Carotid ultrasound.  A scan of the brain circulation (CT angiogram or MRI angiogram).  Continuous heart monitoring. How is this treated? The goal of treatment is to reduce the risk for a subsequent stroke. Treatment may include stroke prevention therapies such as:  Changes to diet or lifestyle to decrease your risk. Lifestyle changes may include exercising and stopping smoking.  Medicines to thin the blood (antiplatelets or anticoagulants).  Blood pressure medicines.  Medicines to reduce cholesterol.  Treating other health conditions, such as diabetes or atrial fibrillation. If testing shows that you have narrowing in the arteries to your brain, your health care provider may recommend a procedure, such as:  Carotid endarterectomy. This is a surgery to  remove the blockage from your artery.  Carotid angioplasty and stenting. This is a procedure to open or widen an artery in the neck using a metal mesh tube (stent). The stent helps keep the artery open by supporting the artery walls. Follow these instructions at home: Medicines  Take over-the-counter and prescription medicines only as told by your health care provider.  If you were told to take a medicine to thin your blood, such as aspirin or an anticoagulant, take it exactly as told by your health care provider. ? Taking too much blood-thinning medicine can cause bleeding. ? If you do not take enough blood-thinning medicine, you will not have the protection that you need against a stroke and other problems. Eating and drinking   Eat 5 or more servings of fruits and vegetables each day.  Follow instructions from your health care provider about diet. You may need to follow a certain nutrition plan to help manage risk factors for stroke, such as high blood pressure, high cholesterol, diabetes, or obesity. This may include: ? Eating a low-fat, low-salt diet. ? Including a lot of fiber in your diet. ? Limiting the amount of carbohydrates and sugar in your diet.  Limit alcohol intake to no more than 1 drink a day for nonpregnant women and 2 drinks a day for men. One drink equals 12 oz of beer, 5 oz of wine, or 1 oz of hard liquor. General instructions  Maintain a healthy weight.  Stay physically active. Try to get at least 30 minutes of exercise on most or all days.  Find out if you have sleep apnea, and seek treatment if needed.  Do not use any products that contain nicotine or tobacco, such as cigarettes and e-cigarettes. If you need help quitting, ask your health care provider.  Do not abuse drugs.  Keep all follow-up visits as told by your health care provider. This is important. Where to find more information  American Stroke Association: www.strokeassociation.org  National  Stroke Association: www.stroke.org Get help right away if:  You have chest pain or an irregular heartbeat.  You have any symptoms of stroke. The acronym BEFAST is an easy way to remember the main warning signs of stroke. ? B - Balance problems. Signs include dizziness, sudden trouble walking, or loss of balance. ? E - Eye problems. This includes trouble seeing or a sudden change in vision. ? F - Face changes. This includes sudden weakness or numbness of the face, or the face or eyelid drooping to one side. ? A - Arm weakness or numbness. This happens suddenly and usually on one side of the body. ? S - Speech problems. This includes trouble speaking or trouble understanding speech. ? T - Time. Time to call 911 or seek emergency care. Do not wait to see if symptoms will go away. Make note of the time your symptoms started.  Other signs of stroke may include: ? A sudden, severe headache with no known cause. ? Nausea or vomiting. ? Seizure. These symptoms may represent a serious problem that is an emergency. Do not wait to see if the symptoms will go away. Get medical help right away. Call your local emergency services (911 in the [place] Do  not drive yourself to the hospital. Summary  A TIA happens when an artery in the head or neck is blocked, leading to stroke-like symptoms that then go away quickly. The blockage clears before there is any permanent brain damage. A TIA is a medical emergency and requires immediate medical attention.  Symptoms of this condition are the same as those of a stroke, but they are temporary. The symptoms usually develop suddenly, and they go away quickly, usually within minutes to hours.  Having a TIA means that you are at high risk of a stroke in the near future.  Treatment may include medicines to thin the blood as well as medicines, diet changes, and lifestyle changes to manage conditions that increase the risk of another TIA or a stroke. This information is not  intended to replace advice given to you by your health care provider. Make sure you discuss any questions you have with your health care provider. Document Revised: 06/20/2019 Document Reviewed: 06/20/2019 Elsevier Patient Education  [place].

## 2020-02-10 ENCOUNTER — Other Ambulatory Visit: Payer: Self-pay

## 2020-02-10 ENCOUNTER — Encounter (HOSPITAL_COMMUNITY): Payer: Self-pay

## 2020-02-10 ENCOUNTER — Ambulatory Visit
Admission: RE | Admit: 2020-02-10 | Discharge: 2020-02-10 | Disposition: A | Discharge status: Home / Self Care | Payer: BC Managed Care – PPO | Source: Ambulatory Visit | Attending: Radiation Oncology | Admitting: Radiation Oncology

## 2020-02-10 ENCOUNTER — Ambulatory Visit (HOSPITAL_COMMUNITY)
Admission: RE | Admit: 2020-02-10 | Discharge: 2020-02-10 | Disposition: A | Discharge status: Home / Self Care | Payer: BC Managed Care – PPO | Source: Ambulatory Visit | Attending: Interventional Radiology | Admitting: Interventional Radiology

## 2020-02-10 ENCOUNTER — Other Ambulatory Visit (HOSPITAL_COMMUNITY): Payer: Self-pay | Admitting: Interventional Radiology

## 2020-02-10 DIAGNOSIS — Z510 Encounter for antineoplastic radiation therapy: Secondary | ICD-10-CM | POA: Diagnosis not present

## 2020-02-10 DIAGNOSIS — Z923 Personal history of irradiation: Secondary | ICD-10-CM | POA: Insufficient documentation

## 2020-02-10 DIAGNOSIS — F419 Anxiety disorder, unspecified: Secondary | ICD-10-CM | POA: Diagnosis not present

## 2020-02-10 DIAGNOSIS — G454 Transient global amnesia: Secondary | ICD-10-CM | POA: Insufficient documentation

## 2020-02-10 DIAGNOSIS — E785 Hyperlipidemia, unspecified: Secondary | ICD-10-CM | POA: Diagnosis not present

## 2020-02-10 DIAGNOSIS — I10 Essential (primary) hypertension: Secondary | ICD-10-CM | POA: Insufficient documentation

## 2020-02-10 DIAGNOSIS — I63412 Cerebral infarction due to embolism of left middle cerebral artery: Secondary | ICD-10-CM

## 2020-02-10 DIAGNOSIS — F329 Major depressive disorder, single episode, unspecified: Secondary | ICD-10-CM | POA: Diagnosis not present

## 2020-02-10 DIAGNOSIS — Z853 Personal history of malignant neoplasm of breast: Secondary | ICD-10-CM | POA: Diagnosis not present

## 2020-02-10 DIAGNOSIS — Z87891 Personal history of nicotine dependence: Secondary | ICD-10-CM | POA: Diagnosis not present

## 2020-02-10 DIAGNOSIS — Z79899 Other long term (current) drug therapy: Secondary | ICD-10-CM | POA: Diagnosis not present

## 2020-02-10 DIAGNOSIS — J45909 Unspecified asthma, uncomplicated: Secondary | ICD-10-CM | POA: Insufficient documentation

## 2020-02-10 DIAGNOSIS — K5090 Crohn's disease, unspecified, without complications: Secondary | ICD-10-CM | POA: Diagnosis not present

## 2020-02-10 DIAGNOSIS — Z7982 Long term (current) use of aspirin: Secondary | ICD-10-CM | POA: Diagnosis not present

## 2020-02-10 HISTORY — PX: IR ANGIO INTRA EXTRACRAN SEL COM CAROTID INNOMINATE BILAT MOD SED: IMG5360

## 2020-02-10 HISTORY — PX: IR ANGIO VERTEBRAL SEL VERTEBRAL BILAT MOD SED: IMG5369

## 2020-02-10 HISTORY — PX: IR US GUIDE VASC ACCESS RIGHT: IMG2390

## 2020-02-10 LAB — POCT I-STAT, CHEM 8
BUN: 17 mg/dL (ref 6–20)
Calcium, Ion: 1.21 mmol/L (ref 1.15–1.40)
Chloride: 103 mmol/L (ref 98–111)
Creatinine, Ser: 0.5 mg/dL (ref 0.44–1.00)
Glucose, Bld: 111 mg/dL — ABNORMAL HIGH (ref 70–99)
HCT: 39 % (ref 36.0–46.0)
Hemoglobin: 13.3 g/dL (ref 12.0–15.0)
Potassium: 3.3 mmol/L — ABNORMAL LOW (ref 3.5–5.1)
Sodium: 141 mmol/L (ref 135–145)
TCO2: 28 mmol/L (ref 22–32)

## 2020-02-10 MED ORDER — VERAPAMIL HCL 2.5 MG/ML IV SOLN
INTRAVENOUS | Status: AC
  Filled 2020-02-10: qty 2

## 2020-02-10 MED ORDER — MIDAZOLAM HCL 2 MG/2ML IJ SOLN
INTRAMUSCULAR | Status: AC
  Filled 2020-02-10: qty 2

## 2020-02-10 MED ORDER — SODIUM CHLORIDE 0.9 % IV SOLN
INTRAVENOUS | Status: AC | PRN
  Administered 2020-02-10 (×2): 250 mL via INTRAVENOUS

## 2020-02-10 MED ORDER — MIDAZOLAM HCL 2 MG/2ML IJ SOLN
INTRAMUSCULAR | Status: AC | PRN
  Administered 2020-02-10: 1 mg via INTRAVENOUS

## 2020-02-10 MED ORDER — IOHEXOL 300 MG/ML  SOLN
150.0000 mL | Freq: Once | INTRAMUSCULAR | Status: AC | PRN
  Administered 2020-02-10: 12:00:00 100 mL via INTRA_ARTERIAL

## 2020-02-10 MED ORDER — SODIUM CHLORIDE 0.9 % IV SOLN: INTRAVENOUS | Status: AC

## 2020-02-10 MED ORDER — FENTANYL CITRATE (PF) 100 MCG/2ML IJ SOLN
INTRAMUSCULAR | Status: AC | PRN
  Administered 2020-02-10: 25 ug via INTRAVENOUS

## 2020-02-10 MED ORDER — LIDOCAINE HCL (PF) 1 % IJ SOLN
INTRAMUSCULAR | Status: AC | PRN
  Administered 2020-02-10: 5 mL

## 2020-02-10 MED ORDER — SODIUM CHLORIDE 0.9 % IV SOLN: INTRAVENOUS | Status: DC

## 2020-02-10 MED ORDER — NITROGLYCERIN 1 MG/10 ML FOR IR/CATH LAB
INTRA_ARTERIAL | Status: AC
  Filled 2020-02-10: qty 10

## 2020-02-10 MED ORDER — FENTANYL CITRATE (PF) 100 MCG/2ML IJ SOLN
INTRAMUSCULAR | Status: AC
  Filled 2020-02-10: qty 2

## 2020-02-10 MED ORDER — LIDOCAINE HCL 1 % IJ SOLN
INTRAMUSCULAR | Status: AC
  Filled 2020-02-10: qty 20

## 2020-02-10 MED ORDER — HEPARIN SODIUM (PORCINE) 1000 UNIT/ML IJ SOLN
INTRAMUSCULAR | Status: AC
  Filled 2020-02-10: qty 1

## 2020-02-10 NOTE — H&P (Signed)
Chief Complaint: Patient was seen in consultation today for cerebral arteriogram at the request of Dr Lavera Guise   Supervising Physician: Luanne Bras  Patient Status: [facility] - Out-pt  History of Present Illness: Lark Langenfeld is a 54 y.o. female   Hx Breast Ca Radiation therapy ongoing Admitted to [facility] for TIA and HA after Intercourse 02/01/20 Transient global amnesia Stress level extremely hight with recent " family trauma"  CTA 02/01/20: IMPRESSION: 1. Negative for large vessel occlusion, and no atherosclerosis or significant arterial stenosis identified in the head or neck. 2. But positive for generalized arterial tortuosity, which is severe in the distal cervical ICAs, and associated with some segmental fusiform vessel changes intracranially: bilateral proximal A2 segments, and left MCA M2/M3 segments. The appearance is suspicious for an underlying connective tissue disease. Cerebral Vasculitis is also possible although felt less likely.  Was scheduled for cerebral arteriogram 2/11 but pt having severe post LP headache and was cancelled Underwent Blood patch 2/12 and headache resolved   Now rescheduled for cerebral arteriogram  Headaches resolved No recurrent TIA/amnesia    Past Medical History:  Diagnosis Date  . Allergy   . Anxiety   . Cancer ([place])   . Colitis   . Crohn's disease ([place])   . Depression   . Diverticulosis   . Hyperlipidemia   . Hypertension   . Mild intermittent asthma    rarely uses inhaler  . Nephrolithiasis   . PONV (postoperative nausea and vomiting)     Past Surgical History:  Procedure Laterality Date  . BREAST LUMPECTOMY WITH RADIOACTIVE SEED LOCALIZATION Left 12/02/2019   Procedure: LEFT BREAST LUMPECTOMY WITH RADIOACTIVE SEED LOCALIZATION;  Surgeon: Erroll Luna, MD;  Location: [place];  Service: General;  Laterality: Left;  . COMBINED HYSTEROSCOPY DIAGNOSTIC / D&C    . LEEP    . LITHOTRIPSY    .  RE-EXCISION OF BREAST LUMPECTOMY Left 12/24/2019   Procedure: RE-EXCISION OF LEFT BREAST LUMPECTOMY;  Surgeon: Erroll Luna, MD;  Location: [place];  Service: General;  Laterality: Left;  . WISDOM TOOTH EXTRACTION      Allergies: Azithromycin, Nitrofurantoin, and Tamiflu [oseltamivir phosphate]  Medications: Prior to Admission medications   Medication Sig Start Date End Date Taking? Authorizing Provider  Adalimumab (HUMIRA PEN) 40 MG/0.4ML PNKT Inject 40 mg into the skin every 14 (fourteen) days. Patient taking differently: Inject 40 mg into the skin See admin instructions. Inject 40 mg subcutaneously every other Monday night 09/11/19  Yes Willia Craze, NP  amLODipine (NORVASC) 5 MG tablet TAKE ONE (1) TABLET BY MOUTH EVERY DAY Patient taking differently: Take 5 mg by mouth daily.  10/23/19  Yes Ann Held, DO  aspirin EC 81 MG EC tablet Take 1 tablet (81 mg total) by mouth daily. 02/03/20  Yes Black, Lezlie Octave, NP  atorvastatin (LIPITOR) 20 MG tablet Take 1 tablet (20 mg total) by mouth daily at 6 PM. 02/02/20  Yes Black, Lezlie Octave, NP  desloratadine (CLARINEX) 5 MG tablet Take 5 mg by mouth daily.  08/29/18  Yes [provider]  ergocalciferol (VITAMIN D2) 1.25 MG (50000 UT) capsule Take 50,000 Units by mouth every Thursday.  04/14/19  Yes [provider]  ezetimibe (ZETIA) 10 MG tablet Take 1 tablet (10 mg total) by mouth daily. Patient taking differently: Take 10 mg by mouth at bedtime.  03/07/19  Yes Roma Schanz R, DO  FLUoxetine (PROZAC) 40 MG capsule Take 1 capsule (  40 mg total) by mouth daily. 02/09/20  Yes Ann Held, DO  icosapent Ethyl (VASCEPA) 1 g capsule Take 2 capsules (2 g total) by mouth 2 (two) times daily. 11/27/19  Yes Roma Schanz R, DO  indapamide (LOZOL) 1.25 MG tablet Take 1.25 mg by mouth daily.  11/22/17  Yes [provider]  levocetirizine (XYZAL) 5 MG tablet Take 1 tablet (5 mg total) by  mouth every evening. 06/04/18  Yes Bardelas, Jose A, MD  mesalamine (LIALDA) 1.2 g EC tablet TAKE TWO (2) TABLETS BY MOUTH 2 TIMES DAILY Patient taking differently: Take 2.4 g by mouth daily.  10/14/19  Yes Pyrtle, Lajuan Lines, MD  montelukast (SINGULAIR) 10 MG tablet Take 1 tablet (10 mg total) by mouth at bedtime. 06/04/18  Yes Bardelas, Jens Som, MD  OVER THE COUNTER MEDICATION Take 7 drops by mouth 2 (two) times daily. CBD oil   Yes [provider]  vitamin B-12 (CYANOCOBALAMIN) 1000 MCG tablet Take 1,000 mcg by mouth daily.   Yes [provider]  albuterol (VENTOLIN HFA) 108 (90 Base) MCG/ACT inhaler Inhale 2 puffs into the lungs every 4 (four) hours as needed for wheezing or shortness of breath.    [provider]  fluticasone (FLONASE) 50 MCG/ACT nasal spray Place 1 spray into both nostrils daily.    [provider]  fluticasone (FLOVENT HFA) 110 MCG/ACT inhaler Inhale 2 puffs into the lungs 2 (two) times daily. 02/18/19   Debbrah Alar, NP     Family History  Problem Relation Age of Onset  . Dementia Father   . Alzheimer's disease Father   . Hyperlipidemia Other   . Hypertension Other   . Diabetes Other   . Allergic rhinitis Mother   . Food Allergy Mother        peanut  . Asthma Brother   . Crohn's disease Neg Hx   . Esophageal cancer Neg Hx   . Stomach cancer Neg Hx   . Rectal cancer Neg Hx   . Breast cancer Neg Hx   . Angioedema Neg Hx   . Eczema Neg Hx   . Immunodeficiency Neg Hx   . Urticaria Neg Hx     Social History   Socioeconomic History  . Marital status: Married    Spouse name: Not on file  . Number of children: Not on file  . Years of education: Not on file  . Highest education level: Not on file  Occupational History  . Not on file  Tobacco Use  . Smoking status: Former Smoker    Packs/day: 1.00    Years: 10.00    Pack years: 10.00    Types: Cigarettes    Quit date: 12/26/1995    Years since quitting: 24.1  .  Smokeless tobacco: Never Used  Substance and Sexual Activity  . Alcohol use: Yes    Comment: occasional wine   . Drug use: No  . Sexual activity: Yes  Other Topics Concern  . Not on file  Social History Narrative   Occupation: Babysit   Married   Regular exercise- yes   Social Determinants of Radio broadcast assistant Strain:   . Difficulty of Paying Living Expenses: Not on file  Food Insecurity:   . Worried About Charity fundraiser in the Last Year: Not on file  . Ran Out of Food in the Last Year: Not on file  Transportation Needs:   . Lack of Transportation (Medical): Not on  file  . Lack of Transportation (Non-Medical): Not on file  Physical Activity:   . Days of Exercise per Week: Not on file  . Minutes of Exercise per Session: Not on file  Stress:   . Feeling of Stress : Not on file  Social Connections:   . Frequency of Communication with Friends and Family: Not on file  . Frequency of Social Gatherings with Friends and Family: Not on file  . Attends Religious Services: Not on file  . Active Member of Clubs or Organizations: Not on file  . Attends Archivist Meetings: Not on file  . Marital Status: Not on file    Review of Systems: A 12 point ROS discussed and pertinent positives are indicated in the HPI above.  All other systems are negative.  Review of Systems  Constitutional: Positive for fatigue. Negative for activity change.  Respiratory: Negative for cough and shortness of breath.   Cardiovascular: Negative for chest pain.  Gastrointestinal: Negative for abdominal pain.  Neurological: Positive for headaches. Negative for dizziness, tremors, seizures, syncope, facial asymmetry, speech difficulty, weakness, light-headedness and numbness.  Psychiatric/Behavioral: Negative for behavioral problems and confusion.    Vital Signs: BP 127/76 (BP Location: Right Arm)   Pulse 75   SpO2 97%   Physical Exam Vitals reviewed.  Cardiovascular:     Rate  and Rhythm: Normal rate and regular rhythm.     Heart sounds: Normal heart sounds.  Pulmonary:     Effort: Pulmonary effort is normal.     Breath sounds: Normal breath sounds.  Abdominal:     Palpations: Abdomen is soft.  Musculoskeletal:        General: Normal range of motion.  Skin:    General: Skin is warm and dry.  Neurological:     Mental Status: She is alert and oriented to person, place, and time.  Psychiatric:        Behavior: Behavior normal.        Thought Content: Thought content normal.        Judgment: Judgment normal.     Imaging: CT ANGIO HEAD W OR WO CONTRAST  Result Date: 02/01/2020 CLINICAL DATA:  54 year old female code stroke presentation with sudden onset confusion. EXAM: CT ANGIOGRAPHY HEAD AND NECK TECHNIQUE: Multidetector CT imaging of the head and neck was performed using the standard protocol during bolus administration of intravenous contrast. Multiplanar CT image reconstructions and MIPs were obtained to evaluate the vascular anatomy. Carotid stenosis measurements (when applicable) are obtained utilizing NASCET criteria, using the distal internal carotid diameter as the denominator. CONTRAST:  148m OMNIPAQUE IOHEXOL 350 MG/ML SOLN COMPARISON:  Plain head CT 1156 hours today. FINDINGS: CTA NECK Skeleton: Negative. Mild hyperostosis of the calvarium, normal variant. Upper chest: Negative. Visible central pulmonary artery appears patent. Other neck: Level 2 cervical lymph nodes are at the upper limits of normal bilaterally, but other neck soft tissues are within normal limits. No cystic or necrotic nodes identified. Aortic arch: 3 vessel arch configuration with no arch atherosclerosis. Right carotid system: Negative aside from mildly tortuous right CCA and moderately to severely tortuous right ICA just below the skull base. Left carotid system: Negative left CCA and left carotid bifurcation. Moderately to severely tortuous left ICA just below the skull base. Vertebral  arteries: Proximal right subclavian artery and right vertebral artery origin are normal. Patent right vertebral artery to the skull base without plaque or stenosis. Proximal left subclavian artery and left vertebral artery origin are normal. Fairly  codominant vertebral arteries, the left is slightly larger and patent to the skull base without stenosis. CTA HEAD Posterior circulation: No distal vertebral artery or vertebrobasilar junction stenosis. Patent PICA origins. Patent basilar artery, AICA and SCA origins. Normal basilar tip and PCA origins. Posterior communicating arteries are diminutive or absent. Bilateral PCA branches are within normal limits. Anterior circulation: Both ICA siphons are patent without plaque or stenosis. 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 Ophthalmic artery origins appear normal. Patent carotid termini. Normal MCA and ACA origins. Diminutive anterior communicating artery. Mildly ectatic proximal ACA A2 segments (series 14, image 50 and series 16, image 22. Bilateral ACA branches are within normal limits. Left MCA M1 segment bifurcates early without stenosis. Left MCA branches are tortuous and patent without stenosis but demonstrate mild segmental fusiform enlargement in the distal M2 segments on series 16, image 29. Right MCA M1 segment is tortuous and bifurcates early without stenosis. The right MCA branches are patent with less irregularity than those on the left side, although some mild long segment changes in the distal posterior MCA division on series 16, image 14. Venous sinuses: Patent. Anatomic variants: Slightly dominant left vertebral artery. Review of the MIP images confirms the above findings IMPRESSION: 1. Negative for large vessel occlusion, and no atherosclerosis or significant arterial stenosis identified in the head or neck. 2. But positive for generalized arterial tortuosity, which is severe in the distal cervical ICAs, and associated with some segmental  fusiform vessel changes intracranially: bilateral proximal A2 segments, and left MCA M2/M3 segments. The appearance is suspicious for an underlying connective tissue disease. Cerebral Vasculitis is also possible although felt less likely. The vessel changes are not typical of Fibromuscular Dysplasia (FMD). Electronically Signed   By: Genevie Ann M.D.   On: 02/01/2020 13:04   CT ANGIO NECK W OR WO CONTRAST  Result Date: 02/01/2020 CLINICAL DATA:  54 year old female code stroke presentation with sudden onset confusion. EXAM: CT ANGIOGRAPHY HEAD AND NECK TECHNIQUE: Multidetector CT imaging of the head and neck was performed using the standard protocol during bolus administration of intravenous contrast. Multiplanar CT image reconstructions and MIPs were obtained to evaluate the vascular anatomy. Carotid stenosis measurements (when applicable) are obtained utilizing NASCET criteria, using the distal internal carotid diameter as the denominator. CONTRAST:  150m OMNIPAQUE IOHEXOL 350 MG/ML SOLN COMPARISON:  Plain head CT 1156 hours today. FINDINGS: CTA NECK Skeleton: Negative. Mild hyperostosis of the calvarium, normal variant. Upper chest: Negative. Visible central pulmonary artery appears patent. Other neck: Level 2 cervical lymph nodes are at the upper limits of normal bilaterally, but other neck soft tissues are within normal limits. No cystic or necrotic nodes identified. Aortic arch: 3 vessel arch configuration with no arch atherosclerosis. Right carotid system: Negative aside from mildly tortuous right CCA and moderately to severely tortuous right ICA just below the skull base. Left carotid system: Negative left CCA and left carotid bifurcation. Moderately to severely tortuous left ICA just below the skull base. Vertebral arteries: Proximal right subclavian artery and right vertebral artery origin are normal. Patent right vertebral artery to the skull base without plaque or stenosis. Proximal left subclavian artery  and left vertebral artery origin are normal. Fairly codominant vertebral arteries, the left is slightly larger and patent to the skull base without stenosis. CTA HEAD Posterior circulation: No distal vertebral artery or vertebrobasilar junction stenosis. Patent PICA origins. Patent basilar artery, AICA and SCA origins. Normal basilar tip and PCA origins. Posterior communicating arteries are diminutive or absent. Bilateral  PCA branches are within normal limits. Anterior circulation: Both ICA siphons are patent without plaque or stenosis. 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 Ophthalmic artery origins appear normal. Patent carotid termini. Normal MCA and ACA origins. Diminutive anterior communicating artery. Mildly ectatic proximal ACA A2 segments (series 14, image 50 and series 16, image 22. Bilateral ACA branches are within normal limits. Left MCA M1 segment bifurcates early without stenosis. Left MCA branches are tortuous and patent without stenosis but demonstrate mild segmental fusiform enlargement in the distal M2 segments on series 16, image 29. Right MCA M1 segment is tortuous and bifurcates early without stenosis. The right MCA branches are patent with less irregularity than those on the left side, although some mild long segment changes in the distal posterior MCA division on series 16, image 14. Venous sinuses: Patent. Anatomic variants: Slightly dominant left vertebral artery. Review of the MIP images confirms the above findings IMPRESSION: 1. Negative for large vessel occlusion, and no atherosclerosis or significant arterial stenosis identified in the head or neck. 2. But positive for generalized arterial tortuosity, which is severe in the distal cervical ICAs, and associated with some segmental fusiform vessel changes intracranially: bilateral proximal A2 segments, and left MCA M2/M3 segments. The appearance is suspicious for an underlying connective tissue disease. Cerebral Vasculitis is also  possible although felt less likely. The vessel changes are not typical of Fibromuscular Dysplasia (FMD). Electronically Signed   By: Genevie Ann M.D.   On: 02/01/2020 13:04   MR BRAIN WO CONTRAST  Result Date: 02/02/2020 CLINICAL DATA:  Transient ischemic attack. Severe headache and confusion. EXAM: MRI HEAD WITHOUT CONTRAST TECHNIQUE: Multiplanar, multiecho pulse sequences of the brain and surrounding structures were obtained without intravenous contrast. COMPARISON:  CT studies earlier same day. FINDINGS: Brain: Suspicion of a small grouping of punctate foci of restricted diffusion at the left frontoparietal junction as marked by arrows. These could represent a tiny grouping of micro embolic infarctions. No other convincing diffusion abnormality. Other pulse sequences appear entirely normal. No evidence of old small or large vessel infarction, mass lesion, hemorrhage, hydrocephalus or extra-axial collection. Vascular: Major vessels at the base of the brain show flow. Skull and upper cervical spine: Negative Sinuses/Orbits: Clear/normal Other: None IMPRESSION: No definite abnormal finding. 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 See axial diffusion images 90 through 93. Electronically Signed   By: Nelson Chimes M.D.   On: 02/02/2020 00:48   EEG adult  Result Date: 02/02/2020 Lora Havens, MD     02/02/2020  3:59 PM Patient Name: Sallie Staron MRN: [id number] Epilepsy Attending: Lora Havens Referring Physician/Provider: Dr. Rosalin Hawking Date: 02/02/2020 Duration: 21.56mns Patient history: 54year old female with history of breast cancer currently undergoing XRT presented with transient alteration of awareness.  EEG to evaluate for seizures. Level of alertness: Awake, drowsy  AEDs during EEG study: None Technical aspects: This EEG study was done with scalp electrodes positioned according to the 10-20 International system of electrode placement. Electrical activity was acquired at a sampling rate of 500Hz  and reviewed with a high frequency filter of 70Hz  and a low frequency filter of 1Hz . EEG data were recorded continuously and digitally stored. Description: The posterior dominant rhythm consists of 10 Hz activity of moderate voltage (25-35 uV) seen predominantly in posterior head regions,  symmetric and reactive to eye opening and eye closing.  Drowsiness was characterized by attenuation of the posterior background rhythm. Hyperventilation and photic stimulation were not performed. IMPRESSION: This study is within normal limits. No seizures or epileptiform discharges were seen throughout the recording. Priyanka Barbra Sarks   DG INJECT DIAG/THERA/INC NEEDLE/CATH/PLC EPI/LUMB/SAC W/IMG  Result Date: 02/06/2020 CLINICAL DATA:  Post lumbar puncture headache. FLUOROSCOPY TIME:  Fluoroscopy Time: 12 seconds Radiation Exposure Index: 10.65 microGray*m^2 PROCEDURE: LUMBAR EPIDURAL BLOOD PATCH INJECTION After a thorough discussion of risks and benefits of the procedure, written and verbal consent was obtained. Specific risks included puncture of the thecal sac and dura as well as nontherapeutic injection with general risks of bleeding, infection, injury to nerves, blood vessels, and adjacent structures. Prior to the procedure, 20 mL of the patient's blood was harvested using stringent sterile technique. An interlaminar approach was performed on the left at L3-4. Under stringent sterile technique, overlying skin was cleansed with betadine soap and anesthetized with 1% lidocaine without epinephrine. A 3.5 inch 20 gauge needle was advanced using loss-of-resistance technique. DIAGNOSTIC EPIDURAL INJECTION Injection of Isovue-M 200 shows a good epidural pattern with spread above and below the level of  needle placement, primarily on the side of needle placement. No vascular or subarachnoid opacification is seen. THERAPEUTIC EPIDURAL INJECTION 19 mL of the patient's blood was injected into the epidural space at the site of prior lumbar puncture. IMPRESSION: Technically successful lumbar blood patch at L3-4. Electronically Signed   By: Logan Bores M.D.   On: 02/06/2020 10:48   VAS [place] TRANSCRANIAL DOPPLER W BUBBLES  Result Date: 02/03/2020  Transcranial Doppler with Bubble Indications: Stroke. Performing Technologist: Maudry Mayhew MHA, RDMS, RVT, RDCS  Examination Guidelines: A complete evaluation includes B-mode imaging, spectral Doppler, color Doppler, and power Doppler as needed of all accessible portions of each vessel. Bilateral testing is considered an integral part of a complete examination. Limited examinations for reoccurring indications may be performed as noted.  Summary:  A vascular evaluation was performed. The right middle cerebral artery was studied. An IV was inserted into the patient's left Forearm. Verbal informed consent was obtained.  No evidence of HITS (high intensity transient signals) at rest or with Valsalva. Therefore, there is no evidence of clinically significant PFO (patent foramen ovale). Negative TCD Bubble study *See table(s) above for TCD measurements and observations.  Diagnosing physician: Antony Contras MD Electronically signed by Antony Contras MD on 02/03/2020 at 8:17:43 AM.    Final    ECHOCARDIOGRAM COMPLETE  Result Date: 02/02/2020    ECHOCARDIOGRAM REPORT   Patient Name:   ELLIE BRYAND Date of Exam: 02/02/2020 Medical Rec #:  [id number]              Height:       67.0 in Accession #:    [id number]             Weight:       201.0 lb Date of Birth:  [birth year]              BSA:          2.03 m Patient Age:    75 years               BP:           142/82 mmHg Patient Gender: F                      HR:  72 bpm. Exam Location:  Inpatient Procedure: 2D Echo,  Cardiac Doppler, Color Doppler and Strain Analysis Indications:    TIA 435.9 / G45.9  History:        Patient has no prior history of Echocardiogram examinations.                 Risk Factors:Hypertension and Dyslipidemia. Breast Cancer.  Sonographer:    Jonelle Sidle Dance Referring Phys: [place]  1. Left ventricular ejection fraction, by estimation, is 60 to 65%. The left ventricle has normal function. Left ventricular diastolic parameters are indeterminate. GLS -22.1% (normal range). No regional wall motion abnormalities.  2. Right ventricular systolic function is normal. There is normal pulmonary artery systolic pressure. The estimated right ventricular systolic pressure is 36.6 mmHg.  3. The aortic valve is tricuspid. No significant regurgitation or stenosis.  4. No significant mitral regurgitation.  5. Normal IVC size. FINDINGS  Left Ventricle: Left ventricular ejection fraction, by estimation, is 60 to 65%. The left ventricle has normal function. The left ventricle has no regional wall motion abnormalities. The left ventricular internal cavity size was normal in size. There is  mildly increased left ventricular hypertrophy. Right Ventricle: The right ventricular size is normal. No increase in right ventricular wall thickness. Right ventricular systolic function is normal. There is normal pulmonary artery systolic pressure. The tricuspid regurgitant velocity is 2.16 m/s, and  with an assumed right atrial pressure of 3 mmHg, the estimated right ventricular systolic pressure is 44.0 mmHg. Left Atrium: Left atrial size was normal in size. Right Atrium: Right atrial size was normal in size. Pericardium: There is no evidence of pericardial effusion. Mitral Valve: The mitral valve is normal in structure and function. There is mild thickening of the mitral valve leaflet(s). Mild mitral annular calcification. No evidence of mitral valve regurgitation. No evidence of mitral valve stenosis. Tricuspid  Valve: The tricuspid valve is normal in structure. Tricuspid valve regurgitation is trivial. Aortic Valve: The aortic valve is tricuspid. Aortic valve regurgitation is not visualized. Mild aortic valve sclerosis is present, with no evidence of aortic valve stenosis. Pulmonic Valve: The pulmonic valve was normal in structure. Pulmonic valve regurgitation is not visualized. Aorta: The aortic root is normal in size and structure. Venous: The inferior vena cava is normal in size with greater than 50% respiratory variability, suggesting right atrial pressure of 3 mmHg. IAS/Shunts: No atrial level shunt detected by color flow Doppler.  LEFT VENTRICLE PLAX 2D LVIDd:         3.60 cm  Diastology LVIDs:         2.60 cm  LV e' lateral:   10.35 cm/s LV PW:         1.30 cm  LV E/e' lateral: 10.2 LV IVS:        1.10 cm  LV e' medial:    6.31 cm/s LVOT diam:     1.90 cm  LV E/e' medial:  16.8 LV SV:         75.14 ml LV SV Index:   14.17 LVOT Area:     2.84 cm  RIGHT VENTRICLE             IVC RV Basal diam:  2.20 cm     IVC diam: 1.40 cm RV S prime:     13.70 cm/s TAPSE (M-mode): 2.5 cm LEFT ATRIUM             Index       RIGHT ATRIUM  Index LA diam:        3.70 cm 1.83 cm/m  RA Area:     16.10 cm LA Vol (A2C):   53.1 ml 26.20 ml/m RA Volume:   35.90 ml  17.71 ml/m LA Vol (A4C):   36.8 ml 18.16 ml/m LA Biplane Vol: 44.1 ml 21.76 ml/m  AORTIC VALVE LVOT Vmax:   115.00 cm/s LVOT Vmean:  77.100 cm/s LVOT VTI:    0.265 m  AORTA Ao Root diam: 3.50 cm Ao Asc diam:  3.20 cm MITRAL VALVE                         TRICUSPID VALVE MV Area (PHT): 2.91 cm              TR Peak grad:   18.7 mmHg MV Decel Time: 261 msec              TR Vmax:        216.00 cm/s MV E velocity: 106.00 cm/s 103 cm/s MV A velocity: 81.20 cm/s  70.3 cm/s SHUNTS MV E/A ratio:  1.31        1.5       Systemic VTI:  0.26 m                                      Systemic Diam: 1.90 cm Loralie Champagne MD Electronically signed by Loralie Champagne MD Signature  Date/Time: 02/02/2020/5:09:19 PM    Final    CT HEAD CODE STROKE WO CONTRAST  Addendum Date: 02/01/2020   ADDENDUM REPORT: 02/01/2020 12:04 ADDENDUM: Study discussed by telephone with Dr. Isla Pence on 02/01/2020 at 1201 hours. Electronically Signed   By: Genevie Ann M.D.   On: 02/01/2020 12:04   Result Date: 02/01/2020 CLINICAL DATA:  Code stroke. 53 year old female with sudden onset confusion. EXAM: CT HEAD WITHOUT CONTRAST TECHNIQUE: Contiguous axial images were obtained from the base of the skull through the vertex without intravenous contrast. COMPARISON:  None. FINDINGS: Brain: Normal cerebral volume. No midline shift, ventriculomegaly, mass effect, evidence of mass lesion, intracranial hemorrhage or evidence of cortically based acute infarction. Gray-white matter differentiation is within normal limits throughout the brain. Vascular: No suspicious intracranial vascular hyperdensity. Skull: Negative. Sinuses/Orbits: Visualized paranasal sinuses and mastoids are clear. Other: Visualized orbits and scalp soft tissues are within normal limits. ASPECTS [facility] Stroke Program Early CT Score) Total score (0-10 with 10 being normal): 10 IMPRESSION: Normal noncontrast CT appearance of the brain.  ASPECTS 10. Electronically Signed: By: Genevie Ann M.D. On: 02/01/2020 11:58   VAS [place] LOWER EXTREMITY VENOUS (DVT)  Result Date: 02/02/2020  Lower Venous DVTStudy Indications: Stroke, and history of breast cancer.  Comparison Study: No prior study Performing Technologist: Maudry Mayhew MHA, RDMS, RVT, RDCS  Examination Guidelines: A complete evaluation includes B-mode imaging, spectral Doppler, color Doppler, and power Doppler as needed of all accessible portions of each vessel. Bilateral testing is considered an integral part of a complete examination. Limited examinations for reoccurring indications may be performed as noted. The reflux portion of the exam is performed with the patient in reverse Trendelenburg.   +---------+---------------+---------+-----------+----------+--------------+ RIGHT    CompressibilityPhasicitySpontaneityPropertiesThrombus Aging +---------+---------------+---------+-----------+----------+--------------+ CFV      Full           Yes      Yes                                 +---------+---------------+---------+-----------+----------+--------------+  SFJ      Full                                                        +---------+---------------+---------+-----------+----------+--------------+ FV Prox  Full                                                        +---------+---------------+---------+-----------+----------+--------------+ FV Mid   Full                                                        +---------+---------------+---------+-----------+----------+--------------+ FV DistalFull                                                        +---------+---------------+---------+-----------+----------+--------------+ PFV      Full                                                        +---------+---------------+---------+-----------+----------+--------------+ POP      Full           Yes      Yes                                 +---------+---------------+---------+-----------+----------+--------------+ PTV      Full                                                        +---------+---------------+---------+-----------+----------+--------------+ PERO     Full                                                        +---------+---------------+---------+-----------+----------+--------------+   +---------+---------------+---------+-----------+----------+--------------+ LEFT     CompressibilityPhasicitySpontaneityPropertiesThrombus Aging +---------+---------------+---------+-----------+----------+--------------+ CFV      Full           Yes      Yes                                  +---------+---------------+---------+-----------+----------+--------------+ SFJ      Full                                                        +---------+---------------+---------+-----------+----------+--------------+  FV Prox  Full                                                        +---------+---------------+---------+-----------+----------+--------------+ FV Mid   Full                                                        +---------+---------------+---------+-----------+----------+--------------+ FV DistalFull                                                        +---------+---------------+---------+-----------+----------+--------------+ PFV      Full                                                        +---------+---------------+---------+-----------+----------+--------------+ POP      Full           Yes      Yes                                 +---------+---------------+---------+-----------+----------+--------------+ PTV      Full                                                        +---------+---------------+---------+-----------+----------+--------------+ PERO     Full                                                        +---------+---------------+---------+-----------+----------+--------------+     Summary: RIGHT: - There is no evidence of deep vein thrombosis in the lower extremity.  - No cystic structure found in the popliteal fossa.  LEFT: - There is no evidence of deep vein thrombosis in the lower extremity.  - No cystic structure found in the popliteal fossa.  *See table(s) above for measurements and observations. Electronically signed by Monica Martinez MD on 02/02/2020 at 4:18:15 PM.    Final    DG FL GUIDED LUMBAR PUNCTURE  Result Date: 02/04/2020 CLINICAL DATA:  Transient memory loss.  Abnormal head MRI. EXAM: DIAGNOSTIC LUMBAR PUNCTURE UNDER FLUOROSCOPIC GUIDANCE FLUOROSCOPY TIME:  16 seconds; 64  uGym2 DAP PROCEDURE:  Informed consent was obtained from the patient prior to the procedure, including potential complications of headache, allergy, and pain. With the patient in left lateral decubitus positioning, the lower back was prepped with Betadine. 1% Lidocaine was used for local anesthesia. Lumbar puncture was performed at the L3-4 level using a 20 gauge needle with return of clear colorless  CSF with an opening pressure of 19 cm water. 8.5 ml of CSF were obtained for laboratory studies. The patient tolerated the procedure well and there were no apparent complications. IMPRESSION: 1. Technically successful lumbar puncture under fluoroscopy Electronically Signed   By: Lucrezia Europe M.D.   On: 02/04/2020 13:04    Labs:  CBC: Recent Labs    09/10/19 1217 11/28/19 1500 02/01/20 1202 02/05/20 0917  WBC 9.7 8.5 5.6 7.3  HGB 14.9 14.6 15.1* 14.6  HCT 45.4 43.9 45.6 44.8  PLT 282.0 271 253 266    COAGS: Recent Labs    02/01/20 1202 02/05/20 0917  INR 0.9 1.0  APTT 26  --     BMP: Recent Labs    03/13/19 1144 11/28/19 1500 02/01/20 1202 02/05/20 0917  NA 137 136 138 139  K 3.6 3.4* 3.5 3.4*  CL 99 101 102 103  CO2 30 24 26 25   GLUCOSE 100* 150* 124* 130*  BUN 17 15 15 15   CALCIUM 9.3 9.4 9.2 9.4  CREATININE 0.69 0.53 0.64 0.68  GFRNONAA  --  >60 >60 >60  GFRAA  --  >60 >60 >60    LIVER FUNCTION TESTS: Recent Labs    03/13/19 1144 11/28/19 1500 02/01/20 1202  BILITOT 0.6 0.8 1.0  AST 25 26 31   ALT 25 32 34  ALKPHOS 58 56 70  PROT 7.0 6.9 7.8  ALBUMIN 4.2 4.0 4.2    TUMOR MARKERS: No results for input(s): AFPTM, CEA, CA199, CHROMGRNA in the last 8760 hours.  Assessment and Plan:  Onset headache and transient global amnesia/TIA after intercourse 02/01/20 Work up shows : positive for generalized arterial tortuosity, which is severe in the distal cervical ICAs, and associated with some segmental fusiform vessel changes intracranially: bilateral proximal A2 segments, and left MCA  M2/M3 segments. The appearance is suspicious for an underlying connective tissue disease. Cerebral Vasculitis is also possible although felt less likely.  Scheduled for cerebral arteriogram per Dr Erlinda Hong request  Risks and benefits of cerebral angiogram with intervention were discussed with the patient including, but not limited to bleeding, infection, vascular injury, contrast induced renal failure, stroke or even death.  This interventional procedure involves the use of X-rays and because of the nature of the planned procedure, it is possible that we will have prolonged use of X-ray fluoroscopy.  Potential radiation risks to you include (but are not limited to) the following: - A slightly elevated risk for cancer  several years later in life. This risk is typically less than 0.5% percent. This risk is low in comparison to the normal incidence of human cancer, which is 33% for women and 50% for men according to the [place]. - Radiation induced injury can include skin redness, resembling a rash, tissue breakdown / ulcers and hair loss (which can be temporary or permanent).   The likelihood of either of these occurring depends on the difficulty of the procedure and whether you are sensitive to radiation due to previous procedures, disease, or genetic conditions.   IF your procedure requires a prolonged use of radiation, you will be notified and given written instructions for further action.  It is your responsibility to monitor the irradiated area for the 2 weeks following the procedure and to notify your physician if you are concerned that you have suffered a radiation induced injury.    All of the patient's questions were answered, patient is agreeable to proceed.  Consent signed and in chart.  Thank  you for this interesting consult.  I greatly enjoyed meeting Trea Carnegie and look forward to participating in their care.  A copy of this report was sent to the  requesting provider on this date.  Electronically Signed: Lavonia Drafts, PA-C 02/10/2020, 9:11 AM   I spent a total of  30 Minutes   in face to face in clinical consultation, greater than 50% of which was counseling/coordinating care for cerebral arteriogram

## 2020-02-10 NOTE — Discharge Instructions (Signed)
Radial Site Care  This sheet gives you information about how to care for yourself after your procedure. Your health care provider may also give you more specific instructions. If you have problems or questions, contact your health care provider. What can I expect after the procedure? After the procedure, it is common to have:  Bruising and tenderness at the catheter insertion area. Follow these instructions at home: Medicines  Take over-the-counter and prescription medicines only as told by your health care provider. Insertion site care  Follow instructions from your health care provider about how to take care of your insertion site. Make sure you: ? Wash your hands with soap and water before you change your bandage (dressing). If soap and water are not available, use hand sanitizer. ? Change your dressing as told by your health care provider. ? Leave stitches (sutures), skin glue, or adhesive strips in place. These skin closures may need to stay in place for 2 weeks or longer. If adhesive strip edges start to loosen and curl up, you may trim the loose edges. Do not remove adhesive strips completely unless your health care provider tells you to do that.  Check your insertion site every day for signs of infection. Check for: ? Redness, swelling, or pain. ? Fluid or blood. ? Pus or a bad smell. ? Warmth.  Do not take baths, swim, or use a hot tub until your health care provider approves.  You may shower 24-48 hours after the procedure, or as directed by your health care provider. ? Remove the dressing and gently wash the site with plain soap and water. ? Pat the area dry with a clean towel. ? Do not rub the site. That could cause bleeding.  Do not apply powder or lotion to the site. Activity   For 24 hours after the procedure, or as directed by your health care provider: ? Do not flex or bend the affected arm. ? Do not push or pull heavy objects with the affected arm. ? Do not  drive yourself home from the hospital or clinic. You may drive 24 hours after the procedure unless your health care provider tells you not to. ? Do not operate machinery or power tools.  Do not lift anything that is heavier than 10 lb (4.5 kg), or the limit that you are told, until your health care provider says that it is safe.  Ask your health care provider when it is okay to: ? Return to work or school. ? Resume usual physical activities or sports. ? Resume sexual activity. General instructions  If the catheter site starts to bleed, raise your arm and put firm pressure on the site. If the bleeding does not stop, get help right away. This is a medical emergency.  If you went home on the same day as your procedure, a responsible adult should be with you for the first 24 hours after you arrive home.  Keep all follow-up visits as told by your health care provider. This is important. Contact a health care provider if:  You have a fever.  You have redness, swelling, or yellow drainage around your insertion site. Get help right away if:  You have unusual pain at the radial site.  The catheter insertion area swells very fast.  The insertion area is bleeding, and the bleeding does not stop when you hold steady pressure on the area.  Your arm or hand becomes pale, cool, tingly, or numb. These symptoms may represent a serious problem   that is an emergency. Do not wait to see if the symptoms will go away. Get medical help right away. Call your local emergency services (911 in the [place] Do not drive yourself to the hospital. Summary  After the procedure, it is common to have bruising and tenderness at the site.  Follow instructions from your health care provider about how to take care of your radial site wound. Check the wound every day for signs of infection.  Do not lift anything that is heavier than 10 lb (4.5 kg), or the limit that you are told, until your health care provider says  that it is safe. This information is not intended to replace advice given to you by your health care provider. Make sure you discuss any questions you have with your health care provider. Document Revised: 01/16/2018 Document Reviewed: 01/16/2018 Elsevier Patient Education  2020 [employer]  

## 2020-02-10 NOTE — Procedures (Signed)
S/P 4 vessel cerebral arteriogram RT Rad approach. Findings . 1No stenosis,occlusions,dissections or aterial or venous abnormalities noted on this exam. D/W patient. Arlean Hopping MD

## 2020-02-11 ENCOUNTER — Ambulatory Visit: Payer: BC Managed Care – PPO

## 2020-02-11 ENCOUNTER — Other Ambulatory Visit: Payer: Self-pay

## 2020-02-11 ENCOUNTER — Ambulatory Visit
Admission: RE | Admit: 2020-02-11 | Discharge: 2020-02-11 | Disposition: A | Discharge status: Home / Self Care | Payer: BC Managed Care – PPO | Source: Ambulatory Visit | Attending: Radiation Oncology | Admitting: Radiation Oncology

## 2020-02-11 DIAGNOSIS — Z510 Encounter for antineoplastic radiation therapy: Secondary | ICD-10-CM | POA: Diagnosis not present

## 2020-02-12 ENCOUNTER — Other Ambulatory Visit: Payer: Self-pay

## 2020-02-12 ENCOUNTER — Other Ambulatory Visit: Payer: Self-pay | Admitting: Family Medicine

## 2020-02-12 ENCOUNTER — Ambulatory Visit
Admission: RE | Admit: 2020-02-12 | Discharge: 2020-02-12 | Disposition: A | Discharge status: Home / Self Care | Payer: BC Managed Care – PPO | Source: Ambulatory Visit | Attending: Radiation Oncology | Admitting: Radiation Oncology

## 2020-02-12 ENCOUNTER — Ambulatory Visit: Payer: BC Managed Care – PPO

## 2020-02-12 DIAGNOSIS — Z510 Encounter for antineoplastic radiation therapy: Secondary | ICD-10-CM | POA: Diagnosis not present

## 2020-02-12 LAB — GRAM STAIN
MICRO NUMBER:: 10137264
SPECIMEN QUALITY:: ADEQUATE

## 2020-02-12 LAB — CSF CELL COUNT WITH DIFFERENTIAL
RBC Count, CSF: 1 cells/uL — ABNORMAL HIGH
WBC, CSF: 0 cells/uL (ref 0–5)

## 2020-02-12 LAB — PROTEIN, CSF: Total Protein, CSF: 31 mg/dL (ref 15–45)

## 2020-02-12 LAB — OLIGOCLONAL BANDS, CSF + SERM

## 2020-02-12 LAB — GLUCOSE, CSF: Glucose, CSF: 66 mg/dL (ref 40–80)

## 2020-02-12 LAB — VDRL, CSF: VDRL Quant, CSF: NONREACTIVE

## 2020-02-13 ENCOUNTER — Telehealth: Payer: Self-pay | Admitting: Neurology

## 2020-02-13 ENCOUNTER — Encounter (HOSPITAL_COMMUNITY): Payer: Self-pay

## 2020-02-13 ENCOUNTER — Other Ambulatory Visit: Payer: Self-pay

## 2020-02-13 ENCOUNTER — Ambulatory Visit
Admission: RE | Admit: 2020-02-13 | Discharge: 2020-02-13 | Disposition: A | Discharge status: Home / Self Care | Payer: BC Managed Care – PPO | Source: Ambulatory Visit | Attending: Radiation Oncology | Admitting: Radiation Oncology

## 2020-02-13 DIAGNOSIS — Z510 Encounter for antineoplastic radiation therapy: Secondary | ICD-10-CM | POA: Diagnosis not present

## 2020-02-13 LAB — POCT I-STAT, CHEM 8
BUN: 27 mg/dL — ABNORMAL HIGH (ref 6–20)
Calcium, Ion: 1.07 mmol/L — ABNORMAL LOW (ref 1.15–1.40)
Chloride: 103 mmol/L (ref 98–111)
Creatinine, Ser: 0.6 mg/dL (ref 0.44–1.00)
Glucose, Bld: 109 mg/dL — ABNORMAL HIGH (ref 70–99)
HCT: 42 % (ref 36.0–46.0)
Hemoglobin: 14.3 g/dL (ref 12.0–15.0)
Potassium: 7.2 mmol/L (ref 3.5–5.1)
Sodium: 137 mmol/L (ref 135–145)
TCO2: 31 mmol/L (ref 22–32)

## 2020-02-13 NOTE — Telephone Encounter (Signed)
Thanks agree with your assessment

## 2020-02-13 NOTE — Telephone Encounter (Signed)
Patient with history of breast cancer stage I undergoing XRT and Crohn's disease on Humira and mesalamine was recently admitted for an episode of headache during sexual intercourse followed by inconsolable crying, amnesia, confusion.  Was seen by telemetry neurology and symptoms resolving, NIH score 0.  No TPA given.  Overnight symptom resolved.  When I saw her second day, patient at normal baseline.  Patient has coital headache in the past but never had episode like this before.  Stroke work-up with negative CT head, normal EEG, no DVT, no PFO, EF 60 to 65%.  LDL 125 A1c 5.9.  Hypercoagulable and autoimmune work-up negative. However, CTA showed segmental fusiform vessel changes intracranially including bilateral proximal A2 and left MCA M2/M3 segments.  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  Patient then had outpatient LP which was negative.  Patient did develope post LP headache and received blood patch, headache resolved.  Had cerebral angiogram about 10 days after the episode showed normal intracranial vasculature.  Discussed with interventional neuroradiology Dr. Estanislado Pandy and Dr. Norma Fredrickson who also discussed with Dr. 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 She is currently on ASA 81 and lipitor 20.   I called patient today and relayed all my thoughts to her regarding her case.  She expressed understanding and appreciation.  We also discussed about prevention for further episode.  As per uptodate, pt may consider taking indomethacin or propranolol 30 to 60 minutes before sexual intercourse.  Medication doses can be titrated based on effectiveness.  Patient has an appointment with Dr. Leonie Man on 02/19/2020, she would like to discuss with Dr. Leonie Man about the further plan of prevention.  I will follow the note to Dr. Leonie Man and let him know.   Rosalin Hawking, MD PhD Stroke Neurology 02/13/2020 4:41 PM

## 2020-02-16 ENCOUNTER — Other Ambulatory Visit: Payer: Self-pay

## 2020-02-16 ENCOUNTER — Ambulatory Visit
Admission: RE | Admit: 2020-02-16 | Discharge: 2020-02-16 | Disposition: A | Discharge status: Home / Self Care | Payer: BC Managed Care – PPO | Source: Ambulatory Visit | Attending: Radiation Oncology | Admitting: Radiation Oncology

## 2020-02-16 DIAGNOSIS — Z510 Encounter for antineoplastic radiation therapy: Secondary | ICD-10-CM | POA: Diagnosis not present

## 2020-02-17 ENCOUNTER — Other Ambulatory Visit: Payer: Self-pay

## 2020-02-17 ENCOUNTER — Ambulatory Visit: Payer: BC Managed Care – PPO

## 2020-02-17 ENCOUNTER — Ambulatory Visit
Admission: RE | Admit: 2020-02-17 | Discharge: 2020-02-17 | Disposition: A | Discharge status: Home / Self Care | Payer: BC Managed Care – PPO | Source: Ambulatory Visit | Attending: Radiation Oncology | Admitting: Radiation Oncology

## 2020-02-17 DIAGNOSIS — Z510 Encounter for antineoplastic radiation therapy: Secondary | ICD-10-CM | POA: Diagnosis not present

## 2020-02-18 ENCOUNTER — Ambulatory Visit
Admission: RE | Admit: 2020-02-18 | Discharge: 2020-02-18 | Disposition: A | Discharge status: Home / Self Care | Payer: BC Managed Care – PPO | Source: Ambulatory Visit | Attending: Radiation Oncology | Admitting: Radiation Oncology

## 2020-02-18 ENCOUNTER — Encounter: Payer: Self-pay | Admitting: *Deleted

## 2020-02-18 ENCOUNTER — Other Ambulatory Visit: Payer: Self-pay

## 2020-02-18 DIAGNOSIS — Z510 Encounter for antineoplastic radiation therapy: Secondary | ICD-10-CM | POA: Diagnosis not present

## 2020-02-19 ENCOUNTER — Ambulatory Visit: Payer: BC Managed Care – PPO | Admitting: Neurology

## 2020-02-19 ENCOUNTER — Encounter: Payer: Self-pay | Admitting: Neurology

## 2020-02-19 VITALS — BP 131/81 | HR 79 | Temp 97.7°F | Ht 64.0 in | Wt 201.8 lb

## 2020-02-19 DIAGNOSIS — R413 Other amnesia: Secondary | ICD-10-CM

## 2020-02-19 DIAGNOSIS — G454 Transient global amnesia: Secondary | ICD-10-CM | POA: Diagnosis not present

## 2020-02-19 MED ORDER — INDOMETHACIN 50 MG PO CAPS: 50.0000 mg | ORAL_CAPSULE | ORAL | 0 refills | Status: DC

## 2020-02-19 NOTE — Patient Instructions (Addendum)
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  This is usually a benign condition with very small risk for recurrence and usually improves over time.  I reviewed available imaging films personally and recommend repeating MRI scan of the brain with contrast with thin section through medial temporal lobes as well as EEG.  I also recommend they try indomethacin 30 mg half an hour before colitis to reduce postcoital headaches.  She will return for follow-up in the future in 2 months or call earlier if necessary   Transient Global Amnesia Transient global amnesia causes a sudden and temporary (transient) loss of memory (amnesia). You may recall memories from your distant past and people you know well. However, you may not recall things that happened more recently in the past days, months, or even year. A transient global amnesia episode does not last longer than 24 hours. Transient global amnesia does not affect your other brain functions. Your memory usually returns to normal after an episode is over. One episode of transient global amnesia does not make you more likely to have a stroke, a relapse, or other complications. What are the causes? The cause of this condition is not known. Certain activities have been reported to trigger transient global amnesia. These activities include:  Swimming in very cold or hot water.  Sexual intercourse.  Emotional distress, such as receiving bad news or having a lot of stress at once.  Strenuous exercise or activity. What increases the risk? You are more likely to develop this condition if:  You are 17-7 years old.  You have a history of migraine headaches. What are the signs or symptoms? The main symptoms of this condition include:  Being unable to remember recent events.  Asking repetitive questions about a situation and  surroundings and not recalling the answers to these questions. Other symptoms include:  Restlessness and nervousness.  Confusion.  Headaches.  Dizziness.  Nausea. How is this diagnosed? This condition may be diagnosed based on:  Your symptoms.  A physical exam.  A test to check your mental abilities (cognitive evaluation).  Imaging studies to check brain function. These may include: ? Electroencephalogram (EEG). This test checks the brain's electrical activity. ? CT scan. ? MRI. How is this treated? There is no treatment for this condition. An episode typically goes away on its own after a few hours. You may receive medicines to treat other conditions, such as a migraine. Follow these instructions at home:  Take over-the-counter and prescription medicines only as told by your health care provider.  Avoid taking medicines that can affect thinking, such as pain or sleeping medicines.  Learn what activities may trigger an episode. Avoid these activities as told by your health care provider.  Find ways to manage stress, such as meditation or yoga.  Keep all follow-up visits as told by your health care provider. This is important. Contact a health care provider if you:  Have a migraine that does not go away.  Experience transient global amnesia repeatedly. Get help right away if you:  Have a seizure. Summary  Transient global amnesia causes a sudden and temporary (transient) loss of memory (amnesia).  There is no treatment for this condition. An episode typically goes away on its own after a few hours.  You may receive medicines to treat other conditions, such as a migraine.  Transient global amnesia does not affect your  other brain functions. Your memory usually returns to normal after an episode is over. This information is not intended to replace advice given to you by your health care provider. Make sure you discuss any questions you have with your health care  provider. Document Revised: 12/26/2017 Document Reviewed: 12/26/2017 Elsevier Patient Education  2020 [employer].

## 2020-02-19 NOTE — Progress Notes (Signed)
[facility] [address] [place]. [place] [address] [phone]       OFFICE CONSULT NOTE  [name] Date of Birth:  May 29, 1966 Medical Record Number:  [id number]   Referring MD: Rosalin Hawking Reason for Referral: Headache and memory loss HPI: Lindsey [name] is a 54 year old Caucasian lady seen today for initial office consultation visit.  She is accompanied by her husband.  History is obtained from them, review of electronic medical records and I personally reviewed imaging films in PACS.  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  She also has Crohn's disease diagnosed in 2017 and is on Humira since 2018 as well as mesalamine.  She states that on 02/01/2020 while having sex with her husband she developed her usual coital headache during her orgasm and stopped in wanted to resume but then developed memory difficulties and could not remember events of the day.  She kept on asking the same questions over and over again to the husband despite being told answers she could not remember.  Patient also developed episode of being emotionally upset and persistent crying and was inconsolable.  Her speech was clear and she was fully aware of her surroundings.  She was taken to the emergency room and could not remember events from earlier that day from 9:30 AM until an hour or so after reaching the emergency room when she started remembering new events.  She states to date she still has no memory of the events of the time.  That day that she was not remembering.  She has had no trouble making new memories and remembering more recent events.  She has not had any further headaches.  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  MRI scan of the brain showed tiny punctate left frontoparietal very weakly diffusion positive lesion which was seen only in the axial but not on coronal images.  Transcranial Doppler bubble study was negative for PFO.  2D echo was unremarkable.  LDL cholesterol 125 mg percent.-Cabbell panel was all normal.  Hemoglobin A1c was 5.9.  Spinal tap was done which showed normal protein and glucose and oligoclonal bands were negative.  Patient developed post spine and lumbar puncture headaches which gradually improved.  She underwent cerebral catheter angiogram by Dr. Estanislado Pandy which showed no evidence of vasculitis, aneurysms or vessel stenosis or occlusion.  Patient states that she was under significant stress at that time because of her son as well as she herself is going through treatment for breast cancer.  She has had no further episodes of memory difficulties.  She said no problems making new memories but still cannot remember fully the events of that day.  She has no history of significant head injury, migraines but does have a history of recurrent coital headaches.  ROS:   14 system review of systems is positive for headaches, memory loss, stress, anxiety and all other systems negative  PMH:  Past Medical History:  Diagnosis Date  . Allergy   . Anxiety   . Cancer ([employer])   . Colitis   .  Crohn's disease ([place])   . Depression   . Diverticulosis   . Hyperlipidemia   . Hypertension   . Mild intermittent asthma    rarely uses inhaler  . Nephrolithiasis   . PONV (postoperative nausea and vomiting)   . Stroke [facility])     Social History:  Social History   Socioeconomic History  . Marital status: Married    Spouse name: Not on file  . Number of children: Not on file  . Years of education: Not on file    . Highest education level: Not on file  Occupational History  . Not on file  Tobacco Use  . Smoking status: Former Smoker    Packs/day: 1.00    Years: 10.00    Pack years: 10.00    Types: Cigarettes    Quit date: 12/26/1995    Years since quitting: 24.1  . Smokeless tobacco: Never Used  Substance and Sexual Activity  . Alcohol use: Yes    Comment: occasional wine   . Drug use: No  . Sexual activity: Yes  Other Topics Concern  . Not on file  Social History Narrative   Occupation: Babysit   Married   Regular exercise- yes   Social Determinants of Radio broadcast assistant Strain:   . Difficulty of Paying Living Expenses: Not on file  Food Insecurity:   . Worried About Charity fundraiser in the Last Year: Not on file  . Ran Out of Food in the Last Year: Not on file  Transportation Needs:   . Lack of Transportation (Medical): Not on file  . Lack of Transportation (Non-Medical): Not on file  Physical Activity:   . Days of Exercise per Week: Not on file  . Minutes of Exercise per Session: Not on file  Stress:   . Feeling of Stress : Not on file  Social Connections:   . Frequency of Communication with Friends and Family: Not on file  . Frequency of Social Gatherings with Friends and Family: Not on file  . Attends Religious Services: Not on file  . Active Member of Clubs or Organizations: Not on file  . Attends Archivist Meetings: Not on file  . Marital Status: Not on file  Intimate Partner Violence:   . Fear of Current or Ex-Partner: Not on file  . Emotionally Abused: Not on file  . Physically Abused: Not on file  . Sexually Abused: Not on file    Medications:   Current Outpatient Medications on File Prior to Visit  Medication Sig Dispense Refill  . Adalimumab (HUMIRA PEN) 40 MG/0.4ML PNKT Inject 40 mg into the skin every 14 (fourteen) days. (Patient taking differently: Inject 40 mg into the skin See admin instructions. Inject 40 mg subcutaneously every  other Monday night) 2 each 5  . albuterol (VENTOLIN HFA) 108 (90 Base) MCG/ACT inhaler Inhale 2 puffs into the lungs every 4 (four) hours as needed for wheezing or shortness of breath.    Marland Kitchen amLODipine (NORVASC) 5 MG tablet Take 1 tablet (5 mg total) by mouth daily. 90 tablet 0  . aspirin EC 81 MG EC tablet Take 1 tablet (81 mg total) by mouth daily.    Marland Kitchen atorvastatin (LIPITOR) 20 MG tablet Take 1 tablet (20 mg total) by mouth daily at 6 PM. 30 tablet 1  . desloratadine (CLARINEX) 5 MG tablet Take 5 mg by mouth daily.   4  . ergocalciferol (VITAMIN D2) 1.25 MG (50000 UT) capsule Take 50,000  Units by mouth every Thursday.     . ezetimibe (ZETIA) 10 MG tablet Take 1 tablet (10 mg total) by mouth daily. (Patient taking differently: Take 10 mg by mouth at bedtime. ) 90 tablet 3  . FLUoxetine (PROZAC) 40 MG capsule Take 1 capsule (40 mg total) by mouth daily. 90 capsule 3  . fluticasone (FLONASE) 50 MCG/ACT nasal spray Place 1 spray into both nostrils daily.    . fluticasone (FLOVENT HFA) 110 MCG/ACT inhaler Inhale 2 puffs into the lungs 2 (two) times daily. 1 Inhaler 0  . icosapent Ethyl (VASCEPA) 1 g capsule Take 2 capsules (2 g total) by mouth 2 (two) times daily. 120 capsule 2  . indapamide (LOZOL) 1.25 MG tablet Take 1.25 mg by mouth daily.   9  . levocetirizine (XYZAL) 5 MG tablet Take 1 tablet (5 mg total) by mouth every evening. 30 tablet 0  . mesalamine (LIALDA) 1.2 g EC tablet TAKE TWO (2) TABLETS BY MOUTH 2 TIMES DAILY (Patient taking differently: Take 2.4 g by mouth daily. ) 120 tablet 4  . montelukast (SINGULAIR) 10 MG tablet Take 1 tablet (10 mg total) by mouth at bedtime. 30 tablet 0  . OVER THE COUNTER MEDICATION Take 7 drops by mouth 2 (two) times daily. CBD oil    . POTASSIUM CITRATE PO Take 108 mg by mouth 2 (two) times daily.    . vitamin B-12 (CYANOCOBALAMIN) 1000 MCG tablet Take 1,000 mcg by mouth daily.     No current facility-administered medications on file prior to visit.     Allergies:   Allergies  Allergen Reactions  . Azithromycin Hives  . Nitrofurantoin Other (See Comments)    abd pain  . Tamiflu [Oseltamivir Phosphate] Hives    Physical Exam General: well developed, well nourished middle-aged Caucasian lady, seated, in no evident distress Head: head normocephalic and atraumatic.   Neck: supple with no carotid or supraclavicular bruits Cardiovascular: regular rate and rhythm, no murmurs Musculoskeletal: no deformity Skin:  no rash/petichiae Vascular:  Normal pulses all extremities  Neurologic Exam Mental Status: Awake and fully alert. Oriented to place and time. Recent and remote memory intact. Attention span, concentration and fund of knowledge appropriate. Mood and affect appropriate.  3 word recall 3/3.  Able to name 15 animals which can walk on 4 legs.  Clock drawing 4/4. Cranial Nerves: Fundoscopic exam reveals sharp disc margins. Pupils equal, briskly reactive to light. Extraocular movements full without nystagmus. Visual fields full to confrontation. Hearing intact. Facial sensation intact. Face, tongue, palate moves normally and symmetrically.  Motor: Normal bulk and tone. Normal strength in all tested extremity muscles. Sensory.: intact to touch , pinprick , position and vibratory sensation.  Coordination: Rapid alternating movements normal in all extremities. Finger-to-nose and heel-to-shin performed accurately bilaterally. Gait and Station: Arises from chair without difficulty. Stance is normal. Gait demonstrates normal stride length and balance . Able to heel, toe and tandem walk without difficulty.  Reflexes: 1+ and symmetric. Toes downgoing.       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  I doubt this is reversible cerebral vasoconstriction syndrome due to profound and  significant memory difficulties.  MRI findings are very subtle diffusion punctate abnormalities in the left frontoparietal region.  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  This is usually a benign condition with very small risk for recurrence and usually improves over time.  I reviewed available imaging films personally and recommend repeating MRI scan of the brain with contrast with thin section through medial temporal lobes as well as EEG.  I also recommend they try indomethacin 30 mg half an hour before colitis to reduce postcoital headaches.  Greater than 50% time during this 50-minute consultation visit was spent on counseling and coordination of care about her episode of memory loss and discussion about headaches and answering questions she will return for follow-up in the future in 2 months or call earlier if necessary Antony Contras, MD  [facility] [address] [place], [place] [address]  Phone [phone] Fax [phone] Note: This document was prepared with digital dictation and possible smart phrase technology. Any transcriptional errors that result from this process are unintentional.

## 2020-02-23 ENCOUNTER — Encounter: Payer: Self-pay | Admitting: Radiation Oncology

## 2020-02-23 NOTE — Progress Notes (Signed)
[place]  [phone] Fax:([phone]) [phone]     ID: Lindsey [name] DOB: 11-07-1966  MR#: [id number]  JEH#:[id number]  Patient Care Team: Lindsey [name], Lindsey Apa, DO as PCP - General Lindsey Fus, MD as Consulting Physician (Obstetrics and Gynecology) Bobbitt, Lindsey Muta, MD as Consulting Physician (Allergy and Immunology) Lindsey Luna, MD as Consulting Physician (General Surgery) , Lindsey Dad, MD as Consulting Physician (Oncology) [name], Lindsey Lines, MD as Consulting Physician (Gastroenterology) Lindsey Hurry, MD as Consulting Physician (Urology) Lindsey Kaufmann, RN as Oncology Nurse Navigator Lindsey Germany, RN as Oncology Nurse Navigator Lindsey Gibson, MD as Attending Physician (Radiation Oncology) Lindsey Fila, MD as Consulting Physician (Neurology) Lindsey Cruel, MD OTHER MD:  CHIEF COMPLAINT: noninvasive breast cancer, estrogen receptor positive  CURRENT TREATMENT: Anastrozole   INTERVAL HISTORY: Lindsey [name] returns today for follow up of her noninvasive breast cancer. She was evaluated in the breast cancer clinic on 12/16/2019.  Since her last visit, she underwent re-excision of the positive lumpectomy margin on 12/24/2019 under Dr. Brantley [name]. Pathology from the procedure ([id number]) showed no carcinoma.  This was a little bit more painful than the original surgery, as is frequently the case, but she has recovered nicely.  She then began radiation treatment on 01/21/2020 under Dr. Isidore [name]. Her final treatment was on 02/18/2020.  On 02/01/2020, she experienced a TIA and was admitted to the hospital. Brain MRI showed no definite abnormal finding. She was cleared by neurology for discharge the following day.  Additional work-up detailed below was negative and Dr. Leonie [name] is considering a diagnosis of transient global amnesia  She underwent genetic testing at [facility] on 11/28/2019. Her results returned since her last visit, and these  were negative.   REVIEW OF SYSTEMS: [name] is still somewhat fatigued from her radiation treatments.  Her skin held up well although right now she is having some itching in the treated breast.  They have a Peloton at home and she is enjoying doing that.  She uses it 3-4 times a week.  There have been some difficult developments related to a family friend and the entire family is dealing with this as well as can be.  Aside from these concerns a detailed review of systems today was noncontributory   HISTORY OF CURRENT ILLNESS: From the original intake note:  Lindsey [name] had routine screening mammography on 10/08/2017 showing a possible abnormality in the left breast. She underwent left diagnostic mammography with tomography and left breast ultrasonography at [facility] on 10/17/2017 showing: breast density category B; 9 mm indeterminate left breast mass without sonographic correlate. She was scheduled to undergo biopsy of the left breast mass on 10/22/2017, but the mass was not clearly seen on stereotactic imaging. Left diagnostic mammogram was performed instead and showed the mass as smaller and less conspicuous. Short term follow up was recommended.   She underwent left diagnostic mammogram and left breast ultrasonography on 01/23/2018 and bilateral diagnostic mammogram on 10/11/2018. When she returned for follow up bilateral diagnostic mammogram on 10/15/2019, this showed: breast density category B; new 6 mm group of calcifications within the upper-outer left breast; interval increase in size of lobular mass within the 12 o'clock position. Left breast ultrasonography performed that day measured the 12 o'clock left breast mass at 8 mm and showed no left axillary adenopathy.  Accordingly on 10/17/2019 she proceeded to biopsy of the left breast areas in question. 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   Pathology from the left 12 o'clock mass was negative for malignancy, showing fibrocystic changes with apocrine metaplasia. However, the biopsy clip was found to be located posterior to the mammographically identified mass, so she proceeded to repeat biopsy of the area on 10/30/2019. Pathology [id number]) was again benign, showing fibrocystic changes and fat necrosis.  She opted to proceed with left breast lumpectomy on 12/02/2019 under Dr. Brantley [name]. Pathology from the procedure ([id number]) showed: intermediate grade ductal carcinoma in situ, 0.6 cm; cauterized carcinoma in situ focally present at the lateral margin. Prognostic indicators significant for: estrogen receptor 90% positive with weak staining intensity, and progesterone receptor 100% positive with strong staining intensity.  The patient's subsequent history is as detailed below.   PAST MEDICAL HISTORY: Past Medical History:  Diagnosis Date  . Allergy   . Anxiety   . Cancer ([place])   . Colitis   . Crohn's disease ([place])   . Depression   . Diverticulosis   . Hyperlipidemia   . Hypertension   . Mild intermittent asthma    rarely uses inhaler  . Nephrolithiasis   . PONV (postoperative nausea and vomiting)   . Stroke [facility])     PAST SURGICAL HISTORY: Past Surgical History:  Procedure Laterality Date  . BREAST LUMPECTOMY WITH RADIOACTIVE SEED LOCALIZATION Left 12/02/2019   Procedure: LEFT BREAST LUMPECTOMY WITH RADIOACTIVE SEED LOCALIZATION;  Surgeon: Lindsey Luna, MD;  Location: [place];  Service: General;  Laterality: Left;  . COMBINED HYSTEROSCOPY DIAGNOSTIC / D&C    . IR ANGIO INTRA EXTRACRAN SEL COM CAROTID INNOMINATE BILAT MOD SED  02/10/2020  . IR ANGIO VERTEBRAL SEL VERTEBRAL BILAT MOD SED  02/10/2020  . IR US GUIDE VASC  ACCESS RIGHT  02/10/2020  . LEEP    . LITHOTRIPSY    . RE-EXCISION OF BREAST LUMPECTOMY Left 12/24/2019   Procedure: RE-EXCISION OF LEFT BREAST LUMPECTOMY;  Surgeon: Lindsey Luna, MD;  Location: [place];  Service: General;  Laterality: Left;  . WISDOM TOOTH EXTRACTION      FAMILY HISTORY: Family History  Problem Relation Age of Onset  . Dementia Father   . Alzheimer's disease Father   . Hyperlipidemia Other   . Hypertension Other   . Diabetes Other   . Allergic rhinitis Mother   . Food Allergy Mother        peanut  . Asthma Brother   . Crohn's disease Neg Hx   . Esophageal cancer Neg Hx   . Stomach cancer Neg Hx   . Rectal cancer Neg Hx   . Breast cancer Neg Hx   . Angioedema Neg Hx   . Eczema Neg Hx   . Immunodeficiency Neg Hx   . Urticaria Neg Hx    The patient's father died from Alzheimer's disease at age 46.  The patient's mother is 63 years old as of December 2020.  The patient denies a family hx of ovarian cancer. She reports breast cancer in a maternal cousin at age 34 (recurrent [name] IV in her late 73s) and in a maternal great aunt. She also notes uterine cancer in her maternal grandmother, bladder cancer in an uncle, and colon cancer in her paternal grandmother.  The patient has 2 sisters, no brothers   GYNECOLOGIC HISTORY:  No LMP recorded. (Menstrual status:  Perimenopausal). Menarche: 54 years old Age at first live birth: 54 years old GX P 3 LMP current, irregular, about every 2 months Contraceptive: Barrier methods HRT no Hysterectomy? no BSO?  No   SOCIAL HISTORY: (updated 11/2019)  Adelene is currently working at the [employer].  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  Her husband Yvone Neu works at [employer].  The patient has 3 children, Corey 36 teaches physical education in [place], Legrand Como 17 and Alda Crumb 15 are at home.  Yvone Neu has a child Erasmo Downer from an earlier marriage.   She is 26     ADVANCED DIRECTIVES: In the absence of any documents to the contrary the patient's husband is her healthcare power of attorney   HEALTH MAINTENANCE: Social History   Tobacco Use  . Smoking status: Former Smoker    Packs/day: 1.00    Years: 10.00    Pack years: 10.00    Types: Cigarettes    Quit date: 12/26/1995    Years since quitting: 24.1  . Smokeless tobacco: Never Used  Substance Use Topics  . Alcohol use: Yes    Comment: occasional wine   . Drug use: No     Colonoscopy: 12/2018, repeat in 5 years  PAP: Per Dr. Nori Riis  Bone density: Remote   Allergies  Allergen Reactions  . Azithromycin Hives  . Nitrofurantoin Other (See Comments)    abd pain  . Tamiflu [Oseltamivir Phosphate] Hives    Current Outpatient Medications  Medication Sig Dispense Refill  . Adalimumab (HUMIRA PEN) 40 MG/0.4ML PNKT Inject 40 mg into the skin every 14 (fourteen) days. (Patient taking differently: Inject 40 mg into the skin See admin instructions. Inject 40 mg subcutaneously every other Monday night) 2 each 5  . albuterol (VENTOLIN HFA) 108 (90 Base) MCG/ACT inhaler Inhale 2 puffs into the lungs every 4 (four) hours as needed for wheezing or shortness of breath.    Marland Kitchen amLODipine (NORVASC) 5 MG tablet Take 1 tablet (5 mg total) by mouth daily. 90 tablet 0  . aspirin EC 81 MG EC tablet Take 1 tablet (81 mg total) by mouth daily.    Marland Kitchen atorvastatin (LIPITOR) 20 MG tablet Take 1 tablet (20 mg total) by mouth daily at 6 PM. 30 tablet 1  . desloratadine (CLARINEX) 5 MG tablet Take 5 mg by mouth daily.   4  . ergocalciferol (VITAMIN D2) 1.25 MG (50000 UT) capsule Take 50,000 Units by mouth every Thursday.     . ezetimibe (ZETIA) 10 MG tablet Take 1 tablet (10 mg total) by mouth daily. (Patient taking differently: Take 10 mg by mouth at bedtime. ) 90 tablet 3  . FLUoxetine (PROZAC) 40 MG capsule Take 1 capsule (40 mg total) by mouth daily. 90 capsule 3  . fluticasone (FLONASE) 50 MCG/ACT nasal  spray Place 1 spray into both nostrils daily.    . fluticasone (FLOVENT HFA) 110 MCG/ACT inhaler Inhale 2 puffs into the lungs 2 (two) times daily. 1 Inhaler 0  . icosapent Ethyl (VASCEPA) 1 g capsule Take 2 capsules (2 g total) by mouth 2 (two) times daily. 120 capsule 2  . indapamide (LOZOL) 1.25 MG tablet Take 1.25 mg by mouth daily.   9  . indomethacin (INDOCIN) 50 MG capsule Take 1 capsule (50 mg total) by mouth as directed. Use 30 mins prior to sex 30 capsule 0  . levocetirizine (XYZAL) 5 MG tablet Take 1 tablet (5 mg  total) by mouth every evening. 30 tablet 0  . mesalamine (LIALDA) 1.2 g EC tablet TAKE TWO (2) TABLETS BY MOUTH 2 TIMES DAILY (Patient taking differently: Take 2.4 g by mouth daily. ) 120 tablet 4  . montelukast (SINGULAIR) 10 MG tablet Take 1 tablet (10 mg total) by mouth at bedtime. 30 tablet 0  . OVER THE COUNTER MEDICATION Take 7 drops by mouth 2 (two) times daily. CBD oil    . POTASSIUM CITRATE PO Take 108 mg by mouth 2 (two) times daily.    . vitamin B-12 (CYANOCOBALAMIN) 1000 MCG tablet Take 1,000 mcg by mouth daily.     No current facility-administered medications for this visit.    OBJECTIVE: Middle-aged white woman in no acute distress  Vitals:   02/24/20 1050  BP: 116/66  Pulse: 65  Resp: 18  Temp: 97.8 F (36.6 C)  SpO2: 99%     Body mass index is 34.62 kg/m.   Wt Readings from Last 3 Encounters:  02/24/20 201 lb 11.2 oz (91.5 kg)  02/19/20 201 lb 12.8 oz (91.5 kg)  02/10/20 197 lb (89.4 kg)      ECOG FS:1 - Symptomatic but completely ambulatory  Sclerae unicteric, EOMs intact Wearing a mask No cervical or supraclavicular adenopathy Lungs no rales or rhonchi Heart regular rate and rhythm Abd soft, nontender, positive bowel sounds MSK no focal spinal tenderness, no upper extremity lymphedema Neuro: nonfocal, well oriented, appropriate affect Breasts: The right breast is benign.  The left breast has undergone lumpectomy and radiation.  The  cosmetic result is very good.  There is minimal hyperpigmentation.  There is no evidence of disease recurrence.  Both axillae are benign.   LAB RESULTS:  CMP     Component Value Date/Time   NA 141 02/10/2020 1117   K 3.3 (L) 02/10/2020 1117   CL 103 02/10/2020 1117   CO2 25 02/05/2020 0917   GLUCOSE 111 (H) 02/10/2020 1117   GLUCOSE 94 12/31/2006 1033   BUN 17 02/10/2020 1117   CREATININE 0.50 02/10/2020 1117   CREATININE 0.66 11/12/2015 1452   CALCIUM 9.4 02/05/2020 0917   PROT 7.8 02/01/2020 1202   ALBUMIN 4.2 02/01/2020 1202   AST 31 02/01/2020 1202   ALT 34 02/01/2020 1202   ALKPHOS 70 02/01/2020 1202   BILITOT 1.0 02/01/2020 1202   GFRNONAA >60 02/05/2020 0917   GFRAA >60 02/05/2020 0917    No results found for: TOTALPROTELP, ALBUMINELP, A1GS, A2GS, BETS, BETA2SER, GAMS, MSPIKE, SPEI  Lab Results  Component Value Date   WBC 7.3 02/05/2020   NEUTROABS 2.9 02/01/2020   HGB 13.3 02/10/2020   HCT 39.0 02/10/2020   MCV 90.1 02/05/2020   PLT 266 02/05/2020    No results found for: LABCA2  No components found for: SVXBLT903  No results for input(s): INR in the last 168 hours.  No results found for: LABCA2  No results found for: ESP233  No results found for: AQT622  No results found for: QJF354  No results found for: CA2729  No components found for: HGQUANT  No results found for: CEA1 / No results found for: CEA1   No results found for: AFPTUMOR  No results found for: CHROMOGRNA  No results found for: KPAFRELGTCHN, LAMBDASER, KAPLAMBRATIO (kappa/lambda light chains)  No results found for: HGBA, HGBA2QUANT, HGBFQUANT, HGBSQUAN (Hemoglobinopathy evaluation)   No results found for: LDH  Lab Results  Component Value Date   IRON 101 09/06/2010   IRONPCTSAT 18.3 (L) 09/06/2010   (  Iron and TIBC)  No results found for: FERRITIN  Urinalysis    Component Value Date/Time   COLORURINE YELLOW 02/01/2020 1247   APPEARANCEUR CLEAR 02/01/2020 1247    LABSPEC 1.010 02/01/2020 1247   PHURINE 7.5 02/01/2020 1247   GLUCOSEU NEGATIVE 02/01/2020 1247   GLUCOSEU NEGATIVE 05/08/2012 0811   HGBUR SMALL (A) 02/01/2020 1247   HGBUR large 09/19/2010 1245   BILIRUBINUR NEGATIVE 02/01/2020 1247   BILIRUBINUR neg 10/24/2016 0851   KETONESUR NEGATIVE 02/01/2020 1247   PROTEINUR NEGATIVE 02/01/2020 1247   UROBILINOGEN 0.2 10/24/2016 0851   UROBILINOGEN 0.2 01/05/2015 1143   NITRITE NEGATIVE 02/01/2020 1247   LEUKOCYTESUR NEGATIVE 02/01/2020 1247     STUDIES: CT ANGIO HEAD W OR WO CONTRAST  Result Date: 02/01/2020 CLINICAL DATA:  54 year old female code stroke presentation with sudden onset confusion. EXAM: CT ANGIOGRAPHY HEAD AND NECK TECHNIQUE: Multidetector CT imaging of the head and neck was performed using the standard protocol during bolus administration of intravenous contrast. Multiplanar CT image reconstructions and MIPs were obtained to evaluate the vascular anatomy. Carotid stenosis measurements (when applicable) are obtained utilizing NASCET criteria, using the distal internal carotid diameter as the denominator. CONTRAST:  144m OMNIPAQUE IOHEXOL 350 MG/ML SOLN COMPARISON:  Plain head CT 1156 hours today. FINDINGS: CTA NECK Skeleton: Negative. Mild hyperostosis of the calvarium, normal variant. Upper chest: Negative. Visible central pulmonary artery appears patent. Other neck: Level 2 cervical lymph nodes are at the upper limits of normal bilaterally, but other neck soft tissues are within normal limits. No cystic or necrotic nodes identified. Aortic arch: 3 vessel arch configuration with no arch atherosclerosis. Right carotid system: Negative aside from mildly tortuous right CCA and moderately to severely tortuous right ICA just below the skull base. Left carotid system: Negative left CCA and left carotid bifurcation. Moderately to severely tortuous left ICA just below the skull base. Vertebral arteries: Proximal right subclavian artery and right  vertebral artery origin are normal. Patent right vertebral artery to the skull base without plaque or stenosis. Proximal left subclavian artery and left vertebral artery origin are normal. Fairly codominant vertebral arteries, the left is slightly larger and patent to the skull base without stenosis. CTA HEAD Posterior circulation: No distal vertebral artery or vertebrobasilar junction stenosis. Patent PICA origins. Patent basilar artery, AICA and SCA origins. Normal basilar tip and PCA origins. Posterior communicating arteries are diminutive or absent. Bilateral PCA branches are within normal limits. Anterior circulation: Both ICA siphons are patent without plaque or stenosis. 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 Ophthalmic artery origins appear normal. Patent carotid termini. Normal MCA and ACA origins. Diminutive anterior communicating artery. Mildly ectatic proximal ACA A2 segments (series 14, image 50 and series 16, image 22. Bilateral ACA branches are within normal limits. Left MCA M1 segment bifurcates early without stenosis. Left MCA branches are tortuous and patent without stenosis but demonstrate mild segmental fusiform enlargement in the distal M2 segments on series 16, image 29. Right MCA M1 segment is tortuous and bifurcates early without stenosis. The right MCA branches are patent with less irregularity than those on the left side, although some mild long segment changes in the distal posterior MCA division on series 16, image 14. Venous sinuses: Patent. Anatomic variants: Slightly dominant left vertebral artery. Review of the MIP images confirms the above findings IMPRESSION: 1. Negative for large vessel occlusion, and no atherosclerosis or significant arterial stenosis identified in the head or neck. 2. But positive for generalized arterial tortuosity, which is severe in  the distal cervical ICAs, and associated with some segmental fusiform vessel changes intracranially: bilateral proximal  A2 segments, and left MCA M2/M3 segments. The appearance is suspicious for an underlying connective tissue disease. Cerebral Vasculitis is also possible although felt less likely. The vessel changes are not typical of Fibromuscular Dysplasia (FMD). Electronically Signed   By: Genevie Ann M.D.   On: 02/01/2020 13:04   CT ANGIO NECK W OR WO CONTRAST  Result Date: 02/01/2020 CLINICAL DATA:  54 year old female code stroke presentation with sudden onset confusion. EXAM: CT ANGIOGRAPHY HEAD AND NECK TECHNIQUE: Multidetector CT imaging of the head and neck was performed using the standard protocol during bolus administration of intravenous contrast. Multiplanar CT image reconstructions and MIPs were obtained to evaluate the vascular anatomy. Carotid stenosis measurements (when applicable) are obtained utilizing NASCET criteria, using the distal internal carotid diameter as the denominator. CONTRAST:  160m OMNIPAQUE IOHEXOL 350 MG/ML SOLN COMPARISON:  Plain head CT 1156 hours today. FINDINGS: CTA NECK Skeleton: Negative. Mild hyperostosis of the calvarium, normal variant. Upper chest: Negative. Visible central pulmonary artery appears patent. Other neck: Level 2 cervical lymph nodes are at the upper limits of normal bilaterally, but other neck soft tissues are within normal limits. No cystic or necrotic nodes identified. Aortic arch: 3 vessel arch configuration with no arch atherosclerosis. Right carotid system: Negative aside from mildly tortuous right CCA and moderately to severely tortuous right ICA just below the skull base. Left carotid system: Negative left CCA and left carotid bifurcation. Moderately to severely tortuous left ICA just below the skull base. Vertebral arteries: Proximal right subclavian artery and right vertebral artery origin are normal. Patent right vertebral artery to the skull base without plaque or stenosis. Proximal left subclavian artery and left vertebral artery origin are normal. Fairly  codominant vertebral arteries, the left is slightly larger and patent to the skull base without stenosis. CTA HEAD Posterior circulation: No distal vertebral artery or vertebrobasilar junction stenosis. Patent PICA origins. Patent basilar artery, AICA and SCA origins. Normal basilar tip and PCA origins. Posterior communicating arteries are diminutive or absent. Bilateral PCA branches are within normal limits. Anterior circulation: Both ICA siphons are patent without plaque or stenosis. 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 Ophthalmic artery origins appear normal. Patent carotid termini. Normal MCA and ACA origins. Diminutive anterior communicating artery. Mildly ectatic proximal ACA A2 segments (series 14, image 50 and series 16, image 22. Bilateral ACA branches are within normal limits. Left MCA M1 segment bifurcates early without stenosis. Left MCA branches are tortuous and patent without stenosis but demonstrate mild segmental fusiform enlargement in the distal M2 segments on series 16, image 29. Right MCA M1 segment is tortuous and bifurcates early without stenosis. The right MCA branches are patent with less irregularity than those on the left side, although some mild long segment changes in the distal posterior MCA division on series 16, image 14. Venous sinuses: Patent. Anatomic variants: Slightly dominant left vertebral artery. Review of the MIP images confirms the above findings IMPRESSION: 1. Negative for large vessel occlusion, and no atherosclerosis or significant arterial stenosis identified in the head or neck. 2. But positive for generalized arterial tortuosity, which is severe in the distal cervical ICAs, and associated with some segmental fusiform vessel changes intracranially: bilateral proximal A2 segments, and left MCA M2/M3 segments. The appearance is suspicious for an underlying connective tissue disease. Cerebral Vasculitis is also possible although felt less likely. The vessel  changes are not typical of Fibromuscular Dysplasia (FMD).  Electronically Signed   By: Genevie Ann M.D.   On: 02/01/2020 13:04   MR BRAIN WO CONTRAST  Result Date: 02/02/2020 CLINICAL DATA:  Transient ischemic attack. Severe headache and confusion. EXAM: MRI HEAD WITHOUT CONTRAST TECHNIQUE: Multiplanar, multiecho pulse sequences of the brain and surrounding structures were obtained without intravenous contrast. COMPARISON:  CT studies earlier same day. FINDINGS: Brain: Suspicion of a small grouping of punctate foci of restricted diffusion at the left frontoparietal junction as marked by arrows. These could represent a tiny grouping of micro embolic infarctions. No other convincing diffusion abnormality. Other pulse sequences appear entirely normal. No evidence of old small or large vessel infarction, mass lesion, hemorrhage, hydrocephalus or extra-axial collection. Vascular: Major vessels at the base of the brain show flow. Skull and upper cervical spine: Negative Sinuses/Orbits: Clear/normal Other: None IMPRESSION: No definite abnormal finding. 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 See axial diffusion images 90 through 93. Electronically Signed   By: Nelson Chimes M.D.   On: 02/02/2020 00:48   IR US Guide Vasc Access Right  Result Date: 02/10/2020 CLINICAL DATA:  Headaches. Abnormal CTA of the head and neck. Clinical impression vasculitis.  EXAM: BILATERAL COMMON CAROTID AND INNOMINATE ANGIOGRAPHY  COMPARISON:  CT angiogram of the head and neck of February 01, 2020.  MEDICATIONS: Heparin 2000 intra-arterially units IV; no antibiotic was administered within 1 hour of the procedure.  ANESTHESIA/SEDATION: Versed 1 mg IV; Fentanyl 25 mcg IV  Moderate Sedation Time:  30 minutes  The  patient was continuously monitored during the procedure by the interventional radiology nurse under my direct supervision.  CONTRAST:  Isovue 300 approximately 60 mL.  FLUOROSCOPY TIME:  Fluoroscopy Time: 9 minutes 42 seconds (1166 mGy).  COMPLICATIONS: None immediate.  TECHNIQUE: Informed written consent was obtained from the patient after a thorough discussion of the procedural risks, benefits and alternatives. All questions were addressed. Maximal Sterile Barrier Technique was utilized including caps, mask, sterile gowns, sterile gloves, sterile drape, hand hygiene and skin antiseptic. A timeout was performed prior to the initiation of the procedure.  The right forearm to the wrist was prepped and draped in the usual sterile manner. The radial pulse was then identified and radial palmar anastomosis was then verified to be present. Using ultrasound the morphology of the right radial artery was then documented. Using ultrasound guidance, trans radial access into the right radial artery was then obtained with a micropuncture set and micro guidewire.  Over the 018 inch micro guidewire, a 4/5 French radial sheath was then inserted. The obturator and the micro guidewire were removed. Good aspiration was obtained in the side port of the sheath. A cocktail of 2000 units of heparin, 2.5 mg of verapamil, and 200 mcg of nitroglycerin was then injected through the sheath in diluted form without event.  A right radial angiogram was then obtained.  Over an 035 inch guidewire, a 5 French Simmons 2 diagnostic catheter was then advanced to the aortic arch region, and cannulation was performed of the right vertebral artery, the right common carotid artery and the left common carotid artery.  Following the procedure, hemostasis at the right radial artery puncture site was achieved with a wrist band. The distal radial pulse was verified to be present.  FINDINGS: The origin of the right vertebral  artery demonstrates wide  patency.  The vessel ascends to the cranial skull base. Wide patency is seen of the right vertebrobasilar junction and the right posteroinferior cerebral arteries.  Opacified portion of the basilar artery, the posterior cerebral arteries, the superior cerebellar arteries and the anterior-inferior cerebellar arteries demonstrate opacification into the capillary and venous phases. Unopacified blood is seen in the basilar artery from the contralateral vertebral artery.  There is mild stenosis at the origin of the left vertebral artery.  The vessel, otherwise, opacifies to the cranial skull base unimpeded.  Wide patency is seen of the left vertebrobasilar junction and the left posteroinferior cerebellar artery.  The opacified portion of the basilar artery, the left posterior cerebral artery, the superior cerebellar arteries and the left anterior inferior cerebellar artery opacify into the capillary and venous phases. Unopacified blood is seen in the right side of the artery with faint opacification of the right posterior cerebral artery and the superior cerebellar arteries. The left common carotid arteriogram demonstrates the left external carotid artery origin and its branches to be widely patent.  The left internal carotid artery at the bulb is widely patent. Moderate tortuosity is noticeable of the proximal 1/3 of the left internal carotid artery.  More distally, a sharp tortuosity is seen at the junction of the middle and the distal 1/3 of the left internal carotid artery without evidence of kinking.  The petrous, cavernous and the supraclinoid left ICA demonstrates wide patency.  The left middle cerebral artery and the left anterior cerebral artery opacify into the capillary and venous phases.  Prompt cross-filling via the anterior communicating artery of the right anterior cerebral A2 segment and distally is seen.  The right common carotid arteriogram demonstrates the right external carotid artery and  its major branches to be widely patent.  The right internal carotid artery at the bulb and its proximal 2/3 is widely patent.  There is a U-shaped configuration of the distal 1/3 of the right internal carotid artery. Distal to this the petrous, cavernous and the supraclinoid right ICA demonstrate wide patency.  The right middle cerebral artery and the right anterior cerebral artery opacify into the capillary and venous phases. Transient opacification via the anterior communicating artery of the left anterior cerebral A2 segment is seen.  IMPRESSION: Angiographically no evidence of occlusions, stenosis dissections, or of arteriovenous shunting or aneurysm seen on this study.  PLAN: Follow-up CT angiogram of the head and neck in 6 months time.   Electronically Signed   By: Luanne Bras M.D.   On: 02/10/2020 14:41  EEG adult  Result Date: 02/02/2020 Lora Havens, MD     02/02/2020  3:59 PM Patient Name: Morna Flud MRN: [id number] Epilepsy Attending: Lora Havens Referring Physician/Provider: Dr. Rosalin Hawking Date: 02/02/2020 Duration: 21.54mns Patient history: 54year old female with history of breast cancer currently undergoing XRT presented with transient alteration of awareness.  EEG to evaluate for seizures. Level of alertness: Awake, drowsy AEDs during EEG study: None Technical aspects: This EEG study was done with scalp electrodes positioned according to the 10-20 International system of electrode placement. Electrical activity was acquired at a sampling rate of 500Hz and reviewed with a high frequency filter of 70Hz and a low frequency filter of 1Hz. EEG data were recorded continuously and digitally stored. Description: The posterior dominant rhythm consists of 10 Hz activity of moderate voltage (25-35 uV) seen predominantly in posterior head regions, symmetric and reactive to eye opening and eye closing.  Drowsiness  was characterized by attenuation of the posterior background  rhythm. Hyperventilation and photic stimulation were not performed. IMPRESSION: This study is within normal limits. No seizures or epileptiform discharges were seen throughout the recording. Priyanka Barbra Sarks   DG INJECT DIAG/THERA/INC NEEDLE/CATH/PLC EPI/LUMB/SAC W/IMG  Result Date: 02/06/2020 CLINICAL DATA:  Post lumbar puncture headache. FLUOROSCOPY TIME:  Fluoroscopy Time: 12 seconds Radiation Exposure Index: 10.65 microGray*m^2 PROCEDURE: LUMBAR EPIDURAL BLOOD PATCH INJECTION After a thorough discussion of risks and benefits of the procedure, written and verbal consent was obtained. Specific risks included puncture of the thecal sac and dura as well as nontherapeutic injection with general risks of bleeding, infection, injury to nerves, blood vessels, and adjacent structures. Prior to the procedure, 20 mL of the patient's blood was harvested using stringent sterile technique. An interlaminar approach was performed on the left at L3-4. Under stringent sterile technique, overlying skin was cleansed with betadine soap and anesthetized with 1% lidocaine without epinephrine. A 3.5 inch 20 gauge needle was advanced using loss-of-resistance technique. DIAGNOSTIC EPIDURAL INJECTION Injection of Isovue-M 200 shows a good epidural pattern with spread above and below the level of needle placement, primarily on the side of needle placement. No vascular or subarachnoid opacification is seen. THERAPEUTIC EPIDURAL INJECTION 19 mL of the patient's blood was injected into the epidural space at the site of prior lumbar puncture. IMPRESSION: Technically successful lumbar blood patch at L3-4. Electronically Signed   By: Logan Bores M.D.   On: 02/06/2020 10:48   VAS [place] TRANSCRANIAL DOPPLER W BUBBLES  Result Date: 02/03/2020  Transcranial Doppler with Bubble Indications: Stroke. Performing Technologist: Maudry Mayhew MHA, RDMS, RVT, RDCS  Examination Guidelines: A complete evaluation includes B-mode imaging, spectral  Doppler, color Doppler, and power Doppler as needed of all accessible portions of each vessel. Bilateral testing is considered an integral part of a complete examination. Limited examinations for reoccurring indications may be performed as noted.  Summary:  A vascular evaluation was performed. The right middle cerebral artery was studied. An IV was inserted into the patient's left Forearm. Verbal informed consent was obtained.  No evidence of HITS (high intensity transient signals) at rest or with Valsalva. Therefore, there is no evidence of clinically significant PFO (patent foramen ovale). Negative TCD Bubble study *See table(s) above for TCD measurements and observations.  Diagnosing physician: Antony Contras MD Electronically signed by Antony Contras MD on 02/03/2020 at 8:17:43 AM.    Final    ECHOCARDIOGRAM COMPLETE  Result Date: 02/02/2020    ECHOCARDIOGRAM REPORT   Patient Name:   CARMINA WALLE Date of Exam: 02/02/2020 Medical Rec #:  [id number]              Height:       67.0 in Accession #:    [id number]             Weight:       201.0 lb Date of Birth:  03-15-1966              BSA:          2.03 m Patient Age:    74 years               BP:           142/82 mmHg Patient Gender: F                      HR:           72 bpm. Exam  Location:  Inpatient Procedure: 2D Echo, Cardiac Doppler, Color Doppler and Strain Analysis Indications:    TIA 435.9 / G45.9  History:        Patient has no prior history of Echocardiogram examinations.                 Risk Factors:Hypertension and Dyslipidemia. Breast Cancer.  Sonographer:    Jonelle Sidle Dance Referring Phys: [place]  1. Left ventricular ejection fraction, by estimation, is 60 to 65%. The left ventricle has normal function. Left ventricular diastolic parameters are indeterminate. GLS -22.1% (normal range). No regional wall motion abnormalities.  2. Right ventricular systolic function is normal. There is normal pulmonary artery systolic  pressure. The estimated right ventricular systolic pressure is 25.6 mmHg.  3. The aortic valve is tricuspid. No significant regurgitation or stenosis.  4. No significant mitral regurgitation.  5. Normal IVC size. FINDINGS  Left Ventricle: Left ventricular ejection fraction, by estimation, is 60 to 65%. The left ventricle has normal function. The left ventricle has no regional wall motion abnormalities. The left ventricular internal cavity size was normal in size. There is  mildly increased left ventricular hypertrophy. Right Ventricle: The right ventricular size is normal. No increase in right ventricular wall thickness. Right ventricular systolic function is normal. There is normal pulmonary artery systolic pressure. The tricuspid regurgitant velocity is 2.16 m/s, and  with an assumed right atrial pressure of 3 mmHg, the estimated right ventricular systolic pressure is 38.9 mmHg. Left Atrium: Left atrial size was normal in size. Right Atrium: Right atrial size was normal in size. Pericardium: There is no evidence of pericardial effusion. Mitral Valve: The mitral valve is normal in structure and function. There is mild thickening of the mitral valve leaflet(s). Mild mitral annular calcification. No evidence of mitral valve regurgitation. No evidence of mitral valve stenosis. Tricuspid Valve: The tricuspid valve is normal in structure. Tricuspid valve regurgitation is trivial. Aortic Valve: The aortic valve is tricuspid. Aortic valve regurgitation is not visualized. Mild aortic valve sclerosis is present, with no evidence of aortic valve stenosis. Pulmonic Valve: The pulmonic valve was normal in structure. Pulmonic valve regurgitation is not visualized. Aorta: The aortic root is normal in size and structure. Venous: The inferior vena cava is normal in size with greater than 50% respiratory variability, suggesting right atrial pressure of 3 mmHg. IAS/Shunts: No atrial level shunt detected by color flow Doppler.  LEFT  VENTRICLE PLAX 2D LVIDd:         3.60 cm  Diastology LVIDs:         2.60 cm  LV e' lateral:   10.35 cm/s LV PW:         1.30 cm  LV E/e' lateral: 10.2 LV IVS:        1.10 cm  LV e' medial:    6.31 cm/s LVOT diam:     1.90 cm  LV E/e' medial:  16.8 LV SV:         75.14 ml LV SV Index:   14.17 LVOT Area:     2.84 cm  RIGHT VENTRICLE             IVC RV Basal diam:  2.20 cm     IVC diam: 1.40 cm RV S prime:     13.70 cm/s TAPSE (M-mode): 2.5 cm LEFT ATRIUM             Index       RIGHT ATRIUM  Index LA diam:        3.70 cm 1.83 cm/m  RA Area:     16.10 cm LA Vol (A2C):   53.1 ml 26.20 ml/m RA Volume:   35.90 ml  17.71 ml/m LA Vol (A4C):   36.8 ml 18.16 ml/m LA Biplane Vol: 44.1 ml 21.76 ml/m  AORTIC VALVE LVOT Vmax:   115.00 cm/s LVOT Vmean:  77.100 cm/s LVOT VTI:    0.265 m  AORTA Ao Root diam: 3.50 cm Ao Asc diam:  3.20 cm MITRAL VALVE                         TRICUSPID VALVE MV Area (PHT): 2.91 cm              TR Peak grad:   18.7 mmHg MV Decel Time: 261 msec              TR Vmax:        216.00 cm/s MV E velocity: 106.00 cm/s 103 cm/s MV A velocity: 81.20 cm/s  70.3 cm/s SHUNTS MV E/A ratio:  1.31        1.5       Systemic VTI:  0.26 m                                      Systemic Diam: 1.90 cm Loralie Champagne MD Electronically signed by Loralie Champagne MD Signature Date/Time: 02/02/2020/5:09:19 PM    Final    CT HEAD CODE STROKE WO CONTRAST  Addendum Date: 02/01/2020   ADDENDUM REPORT: 02/01/2020 12:04 ADDENDUM: Study discussed by telephone with Dr. Isla Pence on 02/01/2020 at 1201 hours. Electronically Signed   By: Genevie Ann M.D.   On: 02/01/2020 12:04   Result Date: 02/01/2020 CLINICAL DATA:  Code stroke. 54 year old female with sudden onset confusion. EXAM: CT HEAD WITHOUT CONTRAST TECHNIQUE: Contiguous axial images were obtained from the base of the skull through the vertex without intravenous contrast. COMPARISON:  None. FINDINGS: Brain: Normal cerebral volume. No midline shift, ventriculomegaly,  mass effect, evidence of mass lesion, intracranial hemorrhage or evidence of cortically based acute infarction. Gray-white matter differentiation is within normal limits throughout the brain. Vascular: No suspicious intracranial vascular hyperdensity. Skull: Negative. Sinuses/Orbits: Visualized paranasal sinuses and mastoids are clear. Other: Visualized orbits and scalp soft tissues are within normal limits. ASPECTS [facility] Stroke Program Early CT Score) Total score (0-10 with 10 being normal): 10 IMPRESSION: Normal noncontrast CT appearance of the brain.  ASPECTS 10. Electronically Signed: By: Genevie Ann M.D. On: 02/01/2020 11:58   VAS [place] LOWER EXTREMITY VENOUS (DVT)  Result Date: 02/02/2020  Lower Venous DVTStudy Indications: Stroke, and history of breast cancer.  Comparison Study: No prior study Performing Technologist: Maudry Mayhew MHA, RDMS, RVT, RDCS  Examination Guidelines: A complete evaluation includes B-mode imaging, spectral Doppler, color Doppler, and power Doppler as needed of all accessible portions of each vessel. Bilateral testing is considered an integral part of a complete examination. Limited examinations for reoccurring indications may be performed as noted. The reflux portion of the exam is performed with the patient in reverse Trendelenburg.  +---------+---------------+---------+-----------+----------+--------------+ RIGHT    CompressibilityPhasicitySpontaneityPropertiesThrombus Aging +---------+---------------+---------+-----------+----------+--------------+ CFV      Full           Yes      Yes                                 +---------+---------------+---------+-----------+----------+--------------+  SFJ      Full                                                        +---------+---------------+---------+-----------+----------+--------------+ FV Prox  Full                                                         +---------+---------------+---------+-----------+----------+--------------+ FV Mid   Full                                                        +---------+---------------+---------+-----------+----------+--------------+ FV DistalFull                                                        +---------+---------------+---------+-----------+----------+--------------+ PFV      Full                                                        +---------+---------------+---------+-----------+----------+--------------+ POP      Full           Yes      Yes                                 +---------+---------------+---------+-----------+----------+--------------+ PTV      Full                                                        +---------+---------------+---------+-----------+----------+--------------+ PERO     Full                                                        +---------+---------------+---------+-----------+----------+--------------+   +---------+---------------+---------+-----------+----------+--------------+ LEFT     CompressibilityPhasicitySpontaneityPropertiesThrombus Aging +---------+---------------+---------+-----------+----------+--------------+ CFV      Full           Yes      Yes                                 +---------+---------------+---------+-----------+----------+--------------+ SFJ      Full                                                        +---------+---------------+---------+-----------+----------+--------------+  FV Prox  Full                                                        +---------+---------------+---------+-----------+----------+--------------+ FV Mid   Full                                                        +---------+---------------+---------+-----------+----------+--------------+ FV DistalFull                                                         +---------+---------------+---------+-----------+----------+--------------+ PFV      Full                                                        +---------+---------------+---------+-----------+----------+--------------+ POP      Full           Yes      Yes                                 +---------+---------------+---------+-----------+----------+--------------+ PTV      Full                                                        +---------+---------------+---------+-----------+----------+--------------+ PERO     Full                                                        +---------+---------------+---------+-----------+----------+--------------+     Summary: RIGHT: - There is no evidence of deep vein thrombosis in the lower extremity.  - No cystic structure found in the popliteal fossa.  LEFT: - There is no evidence of deep vein thrombosis in the lower extremity.  - No cystic structure found in the popliteal fossa.  *See table(s) above for measurements and observations. Electronically signed by Monica Martinez MD on 02/02/2020 at 4:18:15 PM.    Final    DG FL GUIDED LUMBAR PUNCTURE  Result Date: 02/04/2020 CLINICAL DATA:  Transient memory loss.  Abnormal head MRI. EXAM: DIAGNOSTIC LUMBAR PUNCTURE UNDER FLUOROSCOPIC GUIDANCE FLUOROSCOPY TIME:  16 seconds; 31  uGym2 DAP PROCEDURE: Informed consent was obtained from the patient prior to the procedure, including potential complications of headache, allergy, and pain. With the patient in left lateral decubitus positioning, the lower back was prepped with Betadine. 1% Lidocaine was used for local anesthesia. Lumbar puncture was performed at the L3-4 level using a 20 gauge needle with return of clear colorless  CSF with an opening pressure of 19 cm water. 8.5 ml of CSF were obtained for laboratory studies. The patient tolerated the procedure well and there were no apparent complications. IMPRESSION: 1. Technically successful lumbar puncture  under fluoroscopy Electronically Signed   By: Lucrezia Europe M.D.   On: 02/04/2020 13:04   IR ANGIO INTRA EXTRACRAN SEL COM CAROTID INNOMINATE BILAT MOD SED  Result Date: 02/11/2020 CLINICAL DATA:  Headaches. Abnormal CTA of the head and neck. Clinical impression vasculitis. EXAM: BILATERAL COMMON CAROTID AND INNOMINATE ANGIOGRAPHY COMPARISON:  CT angiogram of the head and neck of February 01, 2020. MEDICATIONS: Heparin 2000 intra-arterially units IV; no antibiotic was administered within 1 hour of the procedure. ANESTHESIA/SEDATION: Versed 1 mg IV; Fentanyl 25 mcg IV Moderate Sedation Time:  30 minutes The patient was continuously monitored during the procedure by the interventional radiology nurse under my direct supervision. CONTRAST:  Isovue 300 approximately 60 mL. FLUOROSCOPY TIME:  Fluoroscopy Time: 9 minutes 42 seconds (1166 mGy). COMPLICATIONS: None immediate. TECHNIQUE: Informed written consent was obtained from the patient after a thorough discussion of the procedural risks, benefits and alternatives. All questions were addressed. Maximal Sterile Barrier Technique was utilized including caps, mask, sterile gowns, sterile gloves, sterile drape, hand hygiene and skin antiseptic. A timeout was performed prior to the initiation of the procedure. The right forearm to the wrist was prepped and draped in the usual sterile manner. The radial pulse was then identified and radial palmar anastomosis was then verified to be present. Using ultrasound the morphology of the right radial artery was then documented. Using ultrasound guidance, trans radial access into the right radial artery was then obtained with a micropuncture set and micro guidewire. Over the 018 inch micro guidewire, a 4/5 French radial sheath was then inserted. The obturator and the micro guidewire were removed. Good aspiration was obtained in the side port of the sheath. A cocktail of 2000 units of heparin, 2.5 mg of verapamil, and 200 mcg of  nitroglycerin was then injected through the sheath in diluted form without event. A right radial angiogram was then obtained. Over an 035 inch guidewire, a 5 French Simmons 2 diagnostic catheter was then advanced to the aortic arch region, and cannulation was performed of the right vertebral artery, the right common carotid artery and the left common carotid artery. Following the procedure, hemostasis at the right radial artery puncture site was achieved with a wrist band. The distal radial pulse was verified to be present. FINDINGS: The origin of the right vertebral artery demonstrates wide patency. The vessel ascends to the cranial skull base. Wide patency is seen of the right vertebrobasilar junction and the right posteroinferior cerebral arteries. Opacified portion of the basilar artery, the posterior cerebral arteries, the superior cerebellar arteries and the anterior-inferior cerebellar arteries demonstrate opacification into the capillary and venous phases. Unopacified blood is seen in the basilar artery from the contralateral vertebral artery. There is mild stenosis at the origin of the left vertebral artery. The vessel, otherwise, opacifies to the cranial skull base unimpeded. Wide patency is seen of the left vertebrobasilar junction and the left posteroinferior cerebellar artery. The opacified portion of the basilar artery, the left posterior cerebral artery, the superior cerebellar arteries and the left anterior inferior cerebellar artery opacify into the capillary and venous phases. Unopacified blood is seen in the right side of the artery with faint opacification of the right posterior cerebral artery and the superior cerebellar arteries. The left common carotid arteriogram demonstrates the left  external carotid artery origin and its branches to be widely patent. The left internal carotid artery at the bulb is widely patent. Moderate tortuosity is noticeable of the proximal 1/3 of the left internal  carotid artery. More distally, a sharp tortuosity is seen at the junction of the middle and the distal 1/3 of the left internal carotid artery without evidence of kinking. The petrous, cavernous and the supraclinoid left ICA demonstrates wide patency. The left middle cerebral artery and the left anterior cerebral artery opacify into the capillary and venous phases. Prompt cross-filling via the anterior communicating artery of the right anterior cerebral A2 segment and distally is seen. The right common carotid arteriogram demonstrates the right external carotid artery and its major branches to be widely patent. The right internal carotid artery at the bulb and its proximal 2/3 is widely patent. There is a U-shaped configuration of the distal 1/3 of the right internal carotid artery. Distal to this the petrous, cavernous and the supraclinoid right ICA demonstrate wide patency. The right middle cerebral artery and the right anterior cerebral artery opacify into the capillary and venous phases. Transient opacification via the anterior communicating artery of the left anterior cerebral A2 segment is seen. IMPRESSION: Angiographically no evidence of occlusions, stenosis dissections, or of arteriovenous shunting or aneurysm seen on this study. PLAN: Follow-up CT angiogram of the head and neck in 6 months time. Electronically Signed   By: Luanne Bras M.D.   On: 02/10/2020 14:41   IR ANGIO VERTEBRAL SEL VERTEBRAL BILAT MOD SED  Result Date: 02/10/2020 CLINICAL DATA:  Headaches. Abnormal CTA of the head and neck. Clinical impression vasculitis.  EXAM: BILATERAL COMMON CAROTID AND INNOMINATE ANGIOGRAPHY  COMPARISON:  CT angiogram of the head and neck of February 01, 2020.  MEDICATIONS: Heparin 2000 intra-arterially units IV; no antibiotic was administered within 1 hour of the procedure.  ANESTHESIA/SEDATION: Versed 1 mg IV; Fentanyl 25 mcg IV  Moderate Sedation Time:  30 minutes  The patient was continuously  monitored during the procedure by the interventional radiology nurse under my direct supervision.  CONTRAST:  Isovue 300 approximately 60 mL.  FLUOROSCOPY TIME:  Fluoroscopy Time: 9 minutes 42 seconds (1166 mGy).  COMPLICATIONS: None immediate.  TECHNIQUE: Informed written consent was obtained from the patient after a thorough discussion of the procedural risks, benefits and alternatives. All questions were addressed. Maximal Sterile Barrier Technique was utilized including caps, mask, sterile gowns, sterile gloves, sterile drape, hand hygiene and skin antiseptic. A timeout was performed prior to the initiation of the procedure.  The right forearm to the wrist was prepped and draped in the usual sterile manner. The radial pulse was then identified and radial palmar anastomosis was then verified to be present. Using ultrasound the morphology of the right radial artery was then documented. Using ultrasound guidance, trans radial access into the right radial artery was then obtained with a micropuncture set and micro guidewire.  Over the 018 inch micro guidewire, a 4/5 French radial sheath was then inserted. The obturator and the micro guidewire were removed. Good aspiration was obtained in the side port of the sheath. A cocktail of 2000 units of heparin, 2.5 mg of verapamil, and 200 mcg of nitroglycerin was then injected through the sheath in diluted form without event.  A right radial angiogram was then obtained.  Over an 035 inch guidewire, a 5 French Simmons 2 diagnostic catheter was then advanced to the aortic arch region, and cannulation was performed of the right vertebral artery,  the right common carotid artery and the left common carotid artery.  Following the procedure, hemostasis at the right radial artery puncture site was achieved with a wrist band. The distal radial pulse was verified to be present.  FINDINGS: The origin of the right vertebral artery demonstrates wide patency.  The vessel  ascends to the cranial skull base. Wide patency is seen of the right vertebrobasilar junction and the right posteroinferior cerebral arteries.  Opacified portion of the basilar artery, the posterior cerebral arteries, the superior cerebellar arteries and the anterior-inferior cerebellar arteries demonstrate opacification into the capillary and venous phases. Unopacified blood is seen in the basilar artery from the contralateral vertebral artery.  There is mild stenosis at the origin of the left vertebral artery.  The vessel, otherwise, opacifies to the cranial skull base unimpeded.  Wide patency is seen of the left vertebrobasilar junction and the left posteroinferior cerebellar artery.  The opacified portion of the basilar artery, the left posterior cerebral artery, the superior cerebellar arteries and the left anterior inferior cerebellar artery opacify into the capillary and venous phases. Unopacified blood is seen in the right side of the artery with faint opacification of the right posterior cerebral artery and the superior cerebellar arteries. The left common carotid arteriogram demonstrates the left external carotid artery origin and its branches to be widely patent.  The left internal carotid artery at the bulb is widely patent. Moderate tortuosity is noticeable of the proximal 1/3 of the left internal carotid artery.  More distally, a sharp tortuosity is seen at the junction of the middle and the distal 1/3 of the left internal carotid artery without evidence of kinking.  The petrous, cavernous and the supraclinoid left ICA demonstrates wide patency.  The left middle cerebral artery and the left anterior cerebral artery opacify into the capillary and venous phases.  Prompt cross-filling via the anterior communicating artery of the right anterior cerebral A2 segment and distally is seen.  The right common carotid arteriogram demonstrates the right external carotid artery and its major branches to  be widely patent.  The right internal carotid artery at the bulb and its proximal 2/3 is widely patent.  There is a U-shaped configuration of the distal 1/3 of the right internal carotid artery. Distal to this the petrous, cavernous and the supraclinoid right ICA demonstrate wide patency.  The right middle cerebral artery and the right anterior cerebral artery opacify into the capillary and venous phases. Transient opacification via the anterior communicating artery of the left anterior cerebral A2 segment is seen.  IMPRESSION: Angiographically no evidence of occlusions, stenosis dissections, or of arteriovenous shunting or aneurysm seen on this study.  PLAN: Follow-up CT angiogram of the head and neck in 6 months time.   Electronically Signed   By: Luanne Bras M.D.   On: 02/10/2020 14:41    ELIGIBLE FOR AVAILABLE RESEARCH PROTOCOL: no  ASSESSMENT: 54 y.o. 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, with a positive lateral margin  (a) successful additional surgery for margin clearance 12/24/2019  (1) adjuvant radiation 01/21/2020 - 02/18/2020  (2) consider antiestrogens  (3) genetics testing 11/28/2019 at [facility] through the Common Hereditary Cancers Panel test by [facility] showed no deleterious mutations in APC, ATM, AXIN2, BARD1, BMPR1A, BRCA1, BRCA2, BRIP1, CDH1, CDK4, CDKN2A, CHEK2, CTNNA1, DICER1, EPCAM, GREM1, HOXB13, KIT, MEN1, MLH1, MSH2, MSH3, MSH6, MUTYH, NBN, NF1, NTHL1, PALB2, PDGFRA, PMS2, POLD1, POLE, PTEN, RAD50, RAD51C, RAD51D, SDHA,  SDHB, SDHC, SDHD, SMAD4, SMARCA4, STK11, TP53, TSC1, TSC2, VHL.   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  Quite aside from decreasing the already low risk of local recurrence antiestrogens cut in half the risk of developing a new breast cancer in either breast in the future.  That risk otherwise approximate 1 %/year.  We discussed anastrozole in detail and she has a good understanding of the possible toxicities side effects and complications of this agent as well as the benefits.  She is interested in giving it a try.  I am putting the prescription in.  She will return to see me in August.  She will have repeat mammography at that time.  If all is going well then I will see her on a yearly basis thereafter.  She will see Dr. Brantley [name] her surgeon in February 2022 and yearly thereafter  She knows to call for any other issue that may develop before the next visit  Total encounter time 30 minutes.Lindsey Cruel, MD   02/24/2020 11:22 AM Medical Oncology and Hematology [facility] [place], [place] [address] Tel. [phone]    Fax. [phone]   This document serves as a record of services personally performed by Lurline Del, MD. It was created on his behalf by Wilburn Mylar, a trained medical scribe. The creation of this record is based on the scribe's personal observations and the provider's statements to them.   I, Lurline Del MD, have reviewed the above documentation for accuracy and completeness, and I agree with the above.   *Total Encounter Time as defined by the Centers for Medicare and Medicaid Services includes, in addition to the face-to-face time of a patient visit (documented in the note above) non-face-to-face time: obtaining and reviewing outside history, ordering and reviewing medications, tests or procedures, care coordination (communications with other health care professionals or caregivers) and documentation in the medical record.

## 2020-02-24 ENCOUNTER — Other Ambulatory Visit: Payer: Self-pay

## 2020-02-24 ENCOUNTER — Inpatient Hospital Stay: Payer: BC Managed Care – PPO | Attending: Oncology | Admitting: Oncology

## 2020-02-24 VITALS — BP 116/66 | HR 65 | Temp 97.8°F | Resp 18 | Ht 64.0 in | Wt 201.7 lb

## 2020-02-24 DIAGNOSIS — C50412 Malignant neoplasm of upper-outer quadrant of left female breast: Secondary | ICD-10-CM

## 2020-02-24 DIAGNOSIS — G459 Transient cerebral ischemic attack, unspecified: Secondary | ICD-10-CM | POA: Diagnosis not present

## 2020-02-24 DIAGNOSIS — Z170 Estrogen receptor positive status [ER+]: Secondary | ICD-10-CM

## 2020-02-24 DIAGNOSIS — D0512 Intraductal carcinoma in situ of left breast: Secondary | ICD-10-CM | POA: Diagnosis not present

## 2020-02-24 MED ORDER — ANASTROZOLE 1 MG PO TABS: 1.0000 mg | ORAL_TABLET | Freq: Every day | ORAL | 4 refills | Status: DC

## 2020-02-25 ENCOUNTER — Telehealth: Payer: Self-pay | Admitting: Oncology

## 2020-02-25 NOTE — Telephone Encounter (Signed)
I talk with patient regarding schedule  

## 2020-02-26 ENCOUNTER — Other Ambulatory Visit: Payer: Self-pay | Admitting: Neurology

## 2020-02-26 MED ORDER — ALPRAZOLAM 1 MG PO TABS: ORAL_TABLET | ORAL | 0 refills | Status: DC

## 2020-02-26 NOTE — Telephone Encounter (Signed)
I returned the call to the patient. I confirmed her allergies and reviewed Xanax instructions, as prescribed per MRI protocol.   Rx sent to Dr. Krista Blue for approval.

## 2020-02-26 NOTE — Addendum Note (Signed)
Addended by: Noberto Retort C on: 02/26/2020 12:17 PM   Modules accepted: Orders

## 2020-02-26 NOTE — Telephone Encounter (Signed)
Patient returned my call she is scheduled at [facility] for 03/02/20. She did inform me she is claustrophobic and would like something to help her she is aware to have a driver.

## 2020-02-26 NOTE — Telephone Encounter (Signed)
LVM for pt to call back to schedule MRI   [employer] Auth: [id number] (exp. 02/25/20 to 08/22/20)

## 2020-03-02 ENCOUNTER — Other Ambulatory Visit: Payer: Self-pay

## 2020-03-02 ENCOUNTER — Ambulatory Visit (INDEPENDENT_AMBULATORY_CARE_PROVIDER_SITE_OTHER): Payer: BC Managed Care – PPO

## 2020-03-02 DIAGNOSIS — R413 Other amnesia: Secondary | ICD-10-CM

## 2020-03-02 DIAGNOSIS — G454 Transient global amnesia: Secondary | ICD-10-CM

## 2020-03-02 MED ORDER — GADOBENATE DIMEGLUMINE 529 MG/ML IV SOLN
20.0000 mL | Freq: Once | INTRAVENOUS | Status: AC | PRN
  Administered 2020-03-02: 10:00:00 20 mL via INTRAVENOUS

## 2020-03-10 ENCOUNTER — Other Ambulatory Visit: Payer: Self-pay

## 2020-03-10 ENCOUNTER — Ambulatory Visit: Payer: BC Managed Care – PPO | Admitting: Neurology

## 2020-03-10 DIAGNOSIS — R410 Disorientation, unspecified: Secondary | ICD-10-CM

## 2020-03-10 DIAGNOSIS — G454 Transient global amnesia: Secondary | ICD-10-CM

## 2020-03-10 DIAGNOSIS — R413 Other amnesia: Secondary | ICD-10-CM

## 2020-03-16 NOTE — Progress Notes (Signed)
   [facility]  EEG (ELECTROENCEPHALOGRAM) REPORT   STUDY DATE: 03/10/2020 PATIENT NAME: Lindsey [name] DOB: 1966/09/10 MRN: [id number]  ORDERING CLINICIAN: Antony Contras, MD  TECHNOLOGIST: Nonda Lou, RPSGT  TECHNIQUE: Electroencephalogram was recorded utilizing standard 10-20 system of lead placement and reformatted into average and bipolar montages.  RECORDING TIME: 26 minutes 40 seconds  CLINICAL INFORMATION: 54 year old woman with episode of memory loss  FINDINGS: A digital EEG was performed while the patient was awake and drowsy. While awake and most alert there was a 10 hz posterior dominant rhythm. Voltages and frequencies were symmetric.  Rhythmic midline theta, normal variant, was noted at times.  There were no focal, lateralizing, epileptiform activity or seizures seen.  Photic stimulation had a normal driving response. Hyperventilation and recovery did not change the underlying rhythms.  The patient became drowsy but did not enter definitive sleep  IMPRESSION: This is a normal EEG while the patient was awake and drowsy.   INTERPRETING PHYSICIAN:   Shahzaib Azevedo A. Felecia Shelling, MD, PhD, [facility] Certified in Neurology, Clinical Neurophysiology, Sleep Medicine, Pain Medicine and Neuroimaging  [facility] [address] [place], [place] [address] [phone]

## 2020-03-17 ENCOUNTER — Other Ambulatory Visit: Payer: Self-pay | Admitting: Nurse Practitioner

## 2020-03-17 NOTE — Progress Notes (Signed)
I called the patient today about her upcoming follow-up appointment in radiation oncology.   Given the state of the COVID-19 pandemic, concerning case numbers in our community, and guidance from [facility], I offered a phone assessment with the patient to determine if coming to the clinic was necessary. She accepted.  I let the patient know that I had spoken with Dr. Isidore Moos, and she wanted them to know the importance of washing their hands for at least 20 seconds at a time, especially after going out in public, and before they eat.  Limit going out in public whenever possible. Do not touch your face, unless your hands are clean, such as when bathing. Get plenty of rest, eat well, and stay hydrated.   The patient denies any symptomatic concerns.  Specifically, they report good healing of their skin in the radiation fields.  Skin is intact.    I recommended that she continue skin care by applying oil or lotion with vitamin E to the skin in the radiation fields, BID, for 2 more months.  Continue follow-up with medical oncology - follow-up is scheduled on 08/10/20 with Dr. Jana Hakim .  I explained that yearly mammograms are important for patients with intact breast tissue, and physical exams are important after mastectomy for patients that cannot undergo mammography.  I encouraged her to call if she had further questions or concerns about her healing. Otherwise, she will follow-up PRN in radiation oncology. Patient is pleased with this plan, and we will cancel her upcoming follow-up to reduce the risk of COVID-19 transmission.

## 2020-03-19 ENCOUNTER — Inpatient Hospital Stay
Admission: RE | Admit: 2020-03-19 | Discharge: 2020-03-19 | Disposition: A | Discharge status: Home / Self Care | Payer: Self-pay | Source: Ambulatory Visit | Attending: Radiation Oncology | Admitting: Radiation Oncology

## 2020-04-03 ENCOUNTER — Other Ambulatory Visit: Payer: Self-pay | Admitting: Family Medicine

## 2020-04-03 DIAGNOSIS — E785 Hyperlipidemia, unspecified: Secondary | ICD-10-CM

## 2020-04-07 ENCOUNTER — Telehealth: Payer: Self-pay

## 2020-04-07 NOTE — Telephone Encounter (Signed)
I called [facility] at [place] [phone] ext [phone]. I stated we receive labs that were order by a hospital physician. I stated pt was in hospital from 01/31/2020 to 02/02/2020,and LP and labs were order 02/04/2020. I stated that we are unsure if form or bill should be sent to [facility] or hospital MD. The rep advise to fax form back stating our concern, and they will investigate. Form fax back to  [phone] and confirmed.

## 2020-04-07 NOTE — Progress Notes (Addendum)
  Patient Name: Lindsey [name] MRN: [id number] DOB: 03-Aug-1966 Referring Physician: Erroll Luna (Profile Not Attached) Date of Service: 02/23/2020 [facility], [place]                                                        End Of Treatment Note  Diagnoses:  D05.12-Intraductal carcinoma in situ of left breast  Cancer Staging: Cancer Staging Malignant neoplasm of upper-outer quadrant of left breast in female, estrogen receptor positive ([place]) Staging form: Breast, AJCC 8th Edition - Clinical stage from 10/17/2019: Stage 0 (cTis (DCIS), cN0, cM0) - Signed by Gardenia Phlegm, NP on 10/29/2019 - Pathologic stage from 12/02/2019: Stage 0 (pTis (DCIS), pN0, cM0, ER+, PR+) - Signed by Gardenia Phlegm, NP on 12/17/2019  Intent: curative  Radiation Treatment Dates: 01/21/2020 through 02/18/2020 Site Technique Total Dose (Gy) Dose per Fx (Gy) Completed Fx Beam Energies  Breast, Left: Breast_Lt 3D 40.05/40.05 2.67 15/15 10X  Breast, Left: Breast_Lt_Bst 3D 10/10 2 5/5 6X, 10X   Narrative: The patient tolerated radiation therapy relatively well.   Plan: The patient will follow-up with radiation oncology in [place] as needed. -----------------------------------  SEppie Gibson MD

## 2020-05-06 ENCOUNTER — Other Ambulatory Visit: Payer: Self-pay | Admitting: Family Medicine

## 2020-05-06 ENCOUNTER — Telehealth: Payer: Self-pay | Admitting: *Deleted

## 2020-05-06 DIAGNOSIS — E559 Vitamin D deficiency, unspecified: Secondary | ICD-10-CM

## 2020-05-06 DIAGNOSIS — E538 Deficiency of other specified B group vitamins: Secondary | ICD-10-CM

## 2020-05-06 NOTE — Telephone Encounter (Signed)
I added vita d and b12 as well thanks

## 2020-05-06 NOTE — Telephone Encounter (Signed)
Pt has lab appointment tomorrow morning.  The only future orders I see are from December for cmet and lipid panel. Is this all pt needs at this time?

## 2020-05-07 ENCOUNTER — Other Ambulatory Visit: Payer: BC Managed Care – PPO

## 2020-05-12 ENCOUNTER — Other Ambulatory Visit: Payer: Self-pay

## 2020-05-12 ENCOUNTER — Telehealth: Payer: Self-pay

## 2020-05-12 MED ORDER — HUMIRA (2 PEN) 40 MG/0.4ML ~~LOC~~ AJKT: AUTO-INJECTOR | SUBCUTANEOUS | 3 refills | Status: DC

## 2020-05-12 NOTE — Telephone Encounter (Signed)
Faxed refill request from [facility] for Humira 25m/0.4ml every 14 days. Last visit was 09/10/19 with APP. Patient has had new diagnosis of cancer since then. Please advise on her refills

## 2020-05-12 NOTE — Telephone Encounter (Signed)
Ok to refill, she should followup this summer (June, July or Aug), sooner if active GI symptoms

## 2020-05-13 ENCOUNTER — Other Ambulatory Visit: Payer: Self-pay | Admitting: Family Medicine

## 2020-05-17 ENCOUNTER — Other Ambulatory Visit: Payer: Self-pay

## 2020-05-17 ENCOUNTER — Other Ambulatory Visit (INDEPENDENT_AMBULATORY_CARE_PROVIDER_SITE_OTHER): Payer: BC Managed Care – PPO

## 2020-05-17 DIAGNOSIS — E785 Hyperlipidemia, unspecified: Secondary | ICD-10-CM

## 2020-05-17 DIAGNOSIS — E559 Vitamin D deficiency, unspecified: Secondary | ICD-10-CM | POA: Diagnosis not present

## 2020-05-17 DIAGNOSIS — E538 Deficiency of other specified B group vitamins: Secondary | ICD-10-CM

## 2020-05-17 LAB — COMPREHENSIVE METABOLIC PANEL
ALT: 17 U/L (ref 0–35)
AST: 15 U/L (ref 0–37)
Albumin: 4 g/dL (ref 3.5–5.2)
Alkaline Phosphatase: 62 U/L (ref 39–117)
BUN: 22 mg/dL (ref 6–23)
CO2: 26 mEq/L (ref 19–32)
Calcium: 9 mg/dL (ref 8.4–10.5)
Chloride: 102 mEq/L (ref 96–112)
Creatinine, Ser: 0.81 mg/dL (ref 0.40–1.20)
GFR: 73.78 mL/min (ref >60.00)
Glucose, Bld: 113 mg/dL — ABNORMAL HIGH (ref 70–99)
Potassium: 3.5 mEq/L (ref 3.5–5.1)
Sodium: 137 mEq/L (ref 135–145)
Total Bilirubin: 0.6 mg/dL (ref 0.2–1.2)
Total Protein: 6.4 g/dL (ref 6.0–8.3)

## 2020-05-17 LAB — LIPID PANEL
Cholesterol: 174 mg/dL (ref 0–200)
HDL: 54.4 mg/dL (ref >39.00)
LDL Cholesterol: 100 mg/dL — ABNORMAL HIGH (ref 0–99)
NonHDL: 119.42
Total CHOL/HDL Ratio: 3
Triglycerides: 97 mg/dL (ref 0.0–149.0)
VLDL: 19.4 mg/dL (ref 0.0–40.0)

## 2020-05-17 LAB — VITAMIN D 25 HYDROXY (VIT D DEFICIENCY, FRACTURES): VITD: 25.85 ng/mL — ABNORMAL LOW (ref 30.00–100.00)

## 2020-05-17 LAB — VITAMIN B12: Vitamin B-12: 297 pg/mL (ref 211–911)

## 2020-05-19 ENCOUNTER — Other Ambulatory Visit: Payer: Self-pay

## 2020-05-19 MED ORDER — VITAMIN D (ERGOCALCIFEROL) 1.25 MG (50000 UNIT) PO CAPS
50000.0000 [IU] | ORAL_CAPSULE | ORAL | 1 refills | Status: DC
Start: 2020-05-19 — End: 2020-09-22

## 2020-05-29 ENCOUNTER — Other Ambulatory Visit: Payer: Self-pay | Admitting: Family Medicine

## 2020-08-03 ENCOUNTER — Other Ambulatory Visit: Payer: Self-pay | Admitting: Family Medicine

## 2020-08-10 ENCOUNTER — Ambulatory Visit: Payer: BC Managed Care – PPO | Admitting: Oncology

## 2020-08-12 ENCOUNTER — Telehealth (HOSPITAL_COMMUNITY): Payer: Self-pay

## 2020-08-12 NOTE — Telephone Encounter (Signed)
Called to schedule f/u cta head/neck, no answer, left vm. AW 

## 2020-08-17 ENCOUNTER — Other Ambulatory Visit: Payer: Self-pay | Admitting: Internal Medicine

## 2020-08-18 ENCOUNTER — Telehealth: Payer: Self-pay | Admitting: Internal Medicine

## 2020-08-18 ENCOUNTER — Other Ambulatory Visit: Payer: Self-pay

## 2020-08-18 ENCOUNTER — Ambulatory Visit
Admission: RE | Admit: 2020-08-18 | Discharge: 2020-08-18 | Disposition: A | Discharge status: Home / Self Care | Payer: BC Managed Care – PPO | Source: Ambulatory Visit | Attending: Oncology | Admitting: Oncology

## 2020-08-18 DIAGNOSIS — G459 Transient cerebral ischemic attack, unspecified: Secondary | ICD-10-CM

## 2020-08-18 DIAGNOSIS — C50412 Malignant neoplasm of upper-outer quadrant of left female breast: Secondary | ICD-10-CM

## 2020-08-18 DIAGNOSIS — Z170 Estrogen receptor positive status [ER+]: Secondary | ICD-10-CM

## 2020-08-18 HISTORY — DX: Personal history of irradiation: Z92.3

## 2020-08-18 MED ORDER — HUMIRA (2 PEN) 40 MG/0.4ML ~~LOC~~ AJKT: AUTO-INJECTOR | SUBCUTANEOUS | 3 refills | Status: DC

## 2020-08-18 NOTE — Telephone Encounter (Signed)
Pt is requesting a medication refill on her Humira. Pt is scheduled for 10/27.

## 2020-08-18 NOTE — Telephone Encounter (Signed)
Refill sent to pharmacy.   

## 2020-08-20 ENCOUNTER — Telehealth: Payer: Self-pay | Admitting: Internal Medicine

## 2020-08-20 MED ORDER — MESALAMINE 1.2 G PO TBEC: DELAYED_RELEASE_TABLET | ORAL | 0 refills | Status: DC

## 2020-08-20 NOTE — Telephone Encounter (Signed)
Rx sent 

## 2020-08-20 NOTE — Telephone Encounter (Signed)
Patient needs to refill mesalamine. Please send it to [facility] in [place].

## 2020-08-24 ENCOUNTER — Other Ambulatory Visit: Payer: Self-pay

## 2020-08-24 ENCOUNTER — Inpatient Hospital Stay: Payer: BC Managed Care – PPO | Attending: Oncology | Admitting: Oncology

## 2020-08-24 DIAGNOSIS — C50412 Malignant neoplasm of upper-outer quadrant of left female breast: Secondary | ICD-10-CM

## 2020-08-24 DIAGNOSIS — Z170 Estrogen receptor positive status [ER+]: Secondary | ICD-10-CM | POA: Diagnosis not present

## 2020-08-24 DIAGNOSIS — Z853 Personal history of malignant neoplasm of breast: Secondary | ICD-10-CM | POA: Insufficient documentation

## 2020-08-24 NOTE — Progress Notes (Signed)
[place]  [phone] [phone]     ID: Lindsey [name] DOB: 1966-06-04  MR#: [id number]  JYN#:[id number]  Patient Care Team: Carollee Herter, Alferd Apa, DO as PCP - General Maisie Fus, MD as Consulting Physician (Obstetrics and Gynecology) Bobbitt, Sedalia Muta, MD as Consulting Physician (Allergy and Immunology) Erroll Luna, MD as Consulting Physician (General Surgery) Abad Manard, Virgie Dad, MD as Consulting Physician (Oncology) Pyrtle, Lajuan Lines, MD as Consulting Physician (Gastroenterology) Pamala Hurry, MD as Consulting Physician (Urology) Mauro Kaufmann, RN as Oncology Nurse Navigator Rockwell Germany, RN as Oncology Nurse Navigator Eppie Gibson, MD as Attending Physician (Radiation Oncology) Garvin Fila, MD as Consulting Physician (Neurology) Chauncey Cruel, MD OTHER MD:  CHIEF COMPLAINT: noninvasive breast cancer, estrogen receptor positive  CURRENT TREATMENT:  Considering anastrozole   INTERVAL HISTORY: Lindsey [name] returns today for follow up of her noninvasive breast cancer.   She was prescribed anastrozole at her last visit on 02/24/2020.  However she never started it.  She had too many questions in her own mind and did not see a clear advantage to it.  Since her last visit, she underwent bilateral diagnostic mammography with tomography at [facility] on 08/18/2020 showing: breast density category B; no evidence of malignancy in either breast.  She also underwent brain MRI on 03/02/2020, for evaluation of memory loss.  The MRI  was normal.   REVIEW OF SYSTEMS: [name] has pretty much returned to baseline in terms of her activity level and being with family.  She has no unusual headaches visual changes cough phlegm production pleurisy shortness of breath or change in bowel or bladder habits.  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  A detailed review of systems today was otherwise  stable.   HISTORY OF CURRENT ILLNESS: From the original intake note:  Lindsey [name] had routine screening mammography on 10/08/2017 showing a possible abnormality in the left breast. She underwent left diagnostic mammography with tomography and left breast ultrasonography at [facility] on 10/17/2017 showing: breast density category B; 9 mm indeterminate left breast mass without sonographic correlate. She was scheduled to undergo biopsy of the left breast mass on 10/22/2017, but the mass was not clearly seen on stereotactic imaging. Left diagnostic mammogram was performed instead and showed the mass as smaller and less conspicuous. Short term follow up was recommended.   She underwent left diagnostic mammogram and left breast ultrasonography on 01/23/2018 and bilateral diagnostic mammogram on 10/11/2018. When she returned for follow up bilateral diagnostic mammogram on 10/15/2019, this showed: breast density category B; new 6 mm group of calcifications within the upper-outer left breast; interval increase in size of lobular mass within the 12 o'clock position. Left breast ultrasonography performed that day measured the 12 o'clock left breast mass at 8 mm and showed no left axillary adenopathy.  Accordingly on 10/17/2019 she proceeded to biopsy of the left breast areas in question. 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   Pathology from the left 12 o'clock mass was negative for malignancy, showing fibrocystic changes with apocrine metaplasia. However, the biopsy clip was found to be located posterior to the mammographically identified mass, so she proceeded to repeat biopsy of the area on 10/30/2019. Pathology [id number]) was again  benign,  showing fibrocystic changes and fat necrosis.  She opted to proceed with left breast lumpectomy on 12/02/2019 under Dr. Brantley Stage. Pathology from the procedure ([id number]) showed: intermediate grade ductal carcinoma in situ, 0.6 cm; cauterized carcinoma in situ focally present at the lateral margin. Prognostic indicators significant for: estrogen receptor 90% positive with weak staining intensity, and progesterone receptor 100% positive with strong staining intensity.  The patient's subsequent history is as detailed below.   PAST MEDICAL HISTORY: Past Medical History:  Diagnosis Date  . Allergy   . Anxiety   . Cancer ([place])   . Colitis   . Crohn's disease ([place])   . Depression   . Diverticulosis   . Hyperlipidemia   . Hypertension   . Mild intermittent asthma    rarely uses inhaler  . Nephrolithiasis   . Personal history of radiation therapy   . PONV (postoperative nausea and vomiting)   . Stroke [facility])     PAST SURGICAL HISTORY: Past Surgical History:  Procedure Laterality Date  . BREAST BIOPSY Left 10/17/2019  . BREAST LUMPECTOMY Left 12/02/2019  . BREAST LUMPECTOMY WITH RADIOACTIVE SEED LOCALIZATION Left 12/02/2019   Procedure: LEFT BREAST LUMPECTOMY WITH RADIOACTIVE SEED LOCALIZATION;  Surgeon: Erroll Luna, MD;  Location: [facility];  Service: General;  Laterality: Left;  . COMBINED HYSTEROSCOPY DIAGNOSTIC / D&C    . IR ANGIO INTRA EXTRACRAN SEL COM CAROTID INNOMINATE BILAT MOD SED  02/10/2020  . IR ANGIO VERTEBRAL SEL VERTEBRAL BILAT MOD SED  02/10/2020  . IR US GUIDE VASC ACCESS RIGHT  02/10/2020  . LEEP    . LITHOTRIPSY    . RE-EXCISION OF BREAST LUMPECTOMY Left 12/24/2019   Procedure: RE-EXCISION OF LEFT BREAST LUMPECTOMY;  Surgeon: Erroll Luna, MD;  Location: [facility];  Service: General;  Laterality: Left;  . WISDOM TOOTH EXTRACTION      FAMILY HISTORY: Family History  Problem Relation Age of Onset  . Dementia Father    . Alzheimer's disease Father   . Hyperlipidemia Other   . Hypertension Other   . Diabetes Other   . Allergic rhinitis Mother   . Food Allergy Mother        peanut  . Asthma Brother   . Breast cancer Cousin   . Crohn's disease Neg Hx   . Esophageal cancer Neg Hx   . Stomach cancer Neg Hx   . Rectal cancer Neg Hx   . Angioedema Neg Hx   . Eczema Neg Hx   . Immunodeficiency Neg Hx   . Urticaria Neg Hx    The patient's father died from Alzheimer's disease at age 79.  The patient's mother is 45 years old as of December 2020.  The patient denies a family hx of ovarian cancer. She reports breast cancer in a maternal cousin at age 60 (recurrent stage IV in her late 55s) and in a maternal great aunt. She also notes uterine cancer in her maternal grandmother, bladder cancer in an uncle, and colon cancer in her paternal grandmother.  The patient has 2 sisters, no brothers   GYNECOLOGIC HISTORY:  No LMP recorded. (Menstrual status: Perimenopausal). Menarche: 54 years old Age at first live birth: 54 years old GX P 3 LMP current, irregular, about every 2 months Contraceptive: Barrier methods HRT no Hysterectomy? no BSO?  No   SOCIAL HISTORY: (updated 11/2019)  Camary is currently working at the [employer].  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  Her husband Yvone Neu works at [employer].  The patient has 3 children, Corey 36 teaches physical education in [place], Legrand Como 17 and Lanell Crumb 15 are at home.  Yvone Neu has a child Erasmo Downer from an earlier marriage.  She is 26     ADVANCED DIRECTIVES: In the absence of any documents to the contrary the patient's husband is her healthcare power of attorney   HEALTH MAINTENANCE: Social History   Tobacco Use  . Smoking status: Former Smoker    Packs/day: 1.00    Years: 10.00    Pack years: 10.00    Types: Cigarettes    Quit date: 12/26/1995    Years since quitting: 24.6  . Smokeless  tobacco: Never Used  Vaping Use  . Vaping Use: Never used  Substance Use Topics  . Alcohol use: Yes    Comment: occasional wine   . Drug use: No     Colonoscopy: 12/2018, repeat in 5 years  PAP: Per Dr. Nori Riis  Bone density: Remote   Allergies  Allergen Reactions  . Azithromycin Hives  . Nitrofurantoin Other (See Comments)    abd pain  . Tamiflu [Oseltamivir Phosphate] Hives    Current Outpatient Medications  Medication Sig Dispense Refill  . Adalimumab (HUMIRA PEN) 40 MG/0.4ML PNKT INJECT 1 PEN (40 MG) UNDER THE SKIN (SUBCUTANEOUS INJECTION) EVERY 14 DAYS 2 each 3  . albuterol (VENTOLIN HFA) 108 (90 Base) MCG/ACT inhaler Inhale 2 puffs into the lungs every 4 (four) hours as needed for wheezing or shortness of breath.    . ALPRAZolam (XANAX) 1 MG tablet Take 1-2 tablets thirty minutes prior to MRI.  May take one additional tablet before entering scanner, if needed.  MUST HAVE DRIVER. 3 tablet 0  . amLODipine (NORVASC) 5 MG tablet TAKE ONE (1) TABLET BY MOUTH EVERY DAY 90 tablet 0  . anastrozole (ARIMIDEX) 1 MG tablet Take 1 tablet (1 mg total) by mouth daily. 90 tablet 4  . aspirin EC 81 MG EC tablet Take 1 tablet (81 mg total) by mouth daily.    Marland Kitchen atorvastatin (LIPITOR) 20 MG tablet Take 1 tablet (20 mg total) by mouth daily at 6 PM. 30 tablet 1  . desloratadine (CLARINEX) 5 MG tablet Take 5 mg by mouth daily.   4  . ergocalciferol (VITAMIN D2) 1.25 MG (50000 UT) capsule Take 50,000 Units by mouth every Thursday.     . ezetimibe (ZETIA) 10 MG tablet TAKE ONE (1) TABLET BY MOUTH EVERY DAY 90 tablet 3  . FLUoxetine (PROZAC) 40 MG capsule Take 1 capsule (40 mg total) by mouth daily. 90 capsule 3  . fluticasone (FLONASE) 50 MCG/ACT nasal spray Place 1 spray into both nostrils daily.    . fluticasone (FLOVENT HFA) 110 MCG/ACT inhaler Inhale 2 puffs into the lungs 2 (two) times daily. 1 Inhaler 0  . indapamide (LOZOL) 1.25 MG tablet Take 1.25 mg by mouth daily.   9  . indomethacin  (INDOCIN) 50 MG capsule Take 1 capsule (50 mg total) by mouth as directed. Use 30 mins prior to sex 30 capsule 0  . levocetirizine (XYZAL) 5 MG tablet Take 1 tablet (5 mg total) by mouth every evening. 30 tablet 0  . mesalamine (LIALDA) 1.2 g EC tablet TAKE TWO (2) TABLETS BY MOUTH 2 TIMES DAILY 120 tablet 0  . montelukast (SINGULAIR) 10 MG tablet Take 1 tablet (10 mg total) by mouth at bedtime. 30 tablet 0  . OVER THE COUNTER MEDICATION Take  7 drops by mouth 2 (two) times daily. CBD oil    . POTASSIUM CITRATE PO Take 108 mg by mouth 2 (two) times daily.    Marland Kitchen VASCEPA 1 g capsule TAKE 2 CAPSULES BY MOUTH TWICE DAILY 120 capsule 0  . vitamin B-12 (CYANOCOBALAMIN) 1000 MCG tablet Take 1,000 mcg by mouth daily.    . Vitamin D, Ergocalciferol, (DRISDOL) 1.25 MG (50000 UNIT) CAPS capsule Take 1 capsule (50,000 Units total) by mouth every 7 (seven) days. 12 capsule 1   No current facility-administered medications for this visit.    OBJECTIVE: White woman who appears well  There were no vitals filed for this visit.   There is no height or weight on file to calculate BMI.   Wt Readings from Last 3 Encounters:  02/24/20 201 lb 11.2 oz (91.5 kg)  02/19/20 201 lb 12.8 oz (91.5 kg)  02/10/20 197 lb (89.4 kg)      ECOG FS:1 - Symptomatic but completely ambulatory  Sclerae unicteric, EOMs intact Wearing a mask No cervical or supraclavicular adenopathy Lungs no rales or rhonchi Heart regular rate and rhythm Abd soft, nontender, positive bowel sounds MSK no focal spinal tenderness, no upper extremity lymphedema Neuro: nonfocal, well oriented, appropriate affect Breasts: The right breast is benign.  The left breast is status post lumpectomy followed by radiation.  There is no evidence of local recurrence.  Both axillae are benign.   LAB RESULTS:  CMP     Component Value Date/Time   NA 137 05/17/2020 0748   K 3.5 05/17/2020 0748   CL 102 05/17/2020 0748   CO2 26 05/17/2020 0748   GLUCOSE 113  (H) 05/17/2020 0748   GLUCOSE 94 12/31/2006 1033   BUN 22 05/17/2020 0748   CREATININE 0.81 05/17/2020 0748   CREATININE 0.66 11/12/2015 1452   CALCIUM 9.0 05/17/2020 0748   PROT 6.4 05/17/2020 0748   ALBUMIN 4.0 05/17/2020 0748   AST 15 05/17/2020 0748   ALT 17 05/17/2020 0748   ALKPHOS 62 05/17/2020 0748   BILITOT 0.6 05/17/2020 0748   GFRNONAA >60 02/05/2020 0917   GFRAA >60 02/05/2020 0917    No results found for: TOTALPROTELP, ALBUMINELP, A1GS, A2GS, BETS, BETA2SER, GAMS, MSPIKE, SPEI  Lab Results  Component Value Date   WBC 7.3 02/05/2020   NEUTROABS 2.9 02/01/2020   HGB 13.3 02/10/2020   HCT 39.0 02/10/2020   MCV 90.1 02/05/2020   PLT 266 02/05/2020    No results found for: LABCA2  No components found for: VXBLTJ030  No results for input(s): INR in the last 168 hours.  No results found for: LABCA2  No results found for: SPQ330  No results found for: QTM226  No results found for: JFH545  No results found for: CA2729  No components found for: HGQUANT  No results found for: CEA1 / No results found for: CEA1   No results found for: AFPTUMOR  No results found for: CHROMOGRNA  No results found for: KPAFRELGTCHN, LAMBDASER, KAPLAMBRATIO (kappa/lambda light chains)  No results found for: HGBA, HGBA2QUANT, HGBFQUANT, HGBSQUAN (Hemoglobinopathy evaluation)   No results found for: LDH  Lab Results  Component Value Date   IRON 101 09/06/2010   IRONPCTSAT 18.3 (L) 09/06/2010   (Iron and TIBC)  No results found for: FERRITIN  Urinalysis    Component Value Date/Time   COLORURINE YELLOW 02/01/2020 [place] 02/01/2020 1247   LABSPEC 1.010 02/01/2020 1247   PHURINE 7.5 02/01/2020 1247   GLUCOSEU NEGATIVE 02/01/2020  [place] 05/08/2012 0811   HGBUR SMALL (A) 02/01/2020 1247   HGBUR large 09/19/2010 1245   BILIRUBINUR NEGATIVE 02/01/2020 1247   BILIRUBINUR neg 10/24/2016 0851   KETONESUR NEGATIVE 02/01/2020 1247    PROTEINUR NEGATIVE 02/01/2020 1247   UROBILINOGEN 0.2 10/24/2016 0851   UROBILINOGEN 0.2 01/05/2015 1143   NITRITE NEGATIVE 02/01/2020 1247   LEUKOCYTESUR NEGATIVE 02/01/2020 1247     STUDIES: MM DIAG BREAST TOMO BILATERAL  Result Date: 08/18/2020 CLINICAL DATA:  54 year old female with history of left breast cancer in 2020 status post lumpectomy and radiation. Patient presents for routine surveillance. No new problems today. EXAM: DIGITAL DIAGNOSTIC BILATERAL MAMMOGRAM WITH TOMO AND CAD COMPARISON:  Previous exam(s). ACR Breast Density Category b: There are scattered areas of fibroglandular density. FINDINGS: Right breast: No suspicious mass, distortion, or microcalcifications are identified to suggest presence of malignancy. Left breast: Spot 2D magnification views of the lumpectomy site were performed in addition to standard views. There are expected postsurgical changes in the upper outer quadrant. No suspicious mass, distortion, or microcalcifications are identified to suggest presence of malignancy. Mammographic images were processed with CAD. IMPRESSION: Expected postsurgical changes in the left breast. No mammographic evidence of malignancy bilaterally. RECOMMENDATION: Diagnostic mammogram is suggested in 1 year. (Code:DM-B-01Y) I have discussed the findings and recommendations with the patient. If applicable, a reminder letter will be sent to the patient regarding the next appointment. BI-RADS CATEGORY  2: Benign. Electronically Signed   By: Audie Pinto M.D.   On: 08/18/2020 10:38     ELIGIBLE FOR AVAILABLE RESEARCH PROTOCOL: no  ASSESSMENT: 54 y.o. [place], [place] woman status post left lumpectomy 12/02/2019 for ductal carcinoma in situ, grade 2, measuring 0.6 cm, with a positive lateral margin  (a) successful additional surgery for margin clearance 12/24/2019  (1) adjuvant radiation 01/21/2020 - 02/18/2020  (2) anastrozole prescribed March 2021, never started by patient  (3)  genetics testing 11/28/2019 [facility] through the Common Hereditary Cancers Panel test by [facility] showed no deleterious mutations in APC, ATM, AXIN2, BARD1, BMPR1A, BRCA1, BRCA2, BRIP1, CDH1, CDK4, CDKN2A, CHEK2, CTNNA1, DICER1, EPCAM, GREM1, HOXB13, KIT, MEN1, MLH1, MSH2, MSH3, MSH6, MUTYH, NBN, NF1, NTHL1, PALB2, PDGFRA, PMS2, POLD1, POLE, PTEN, RAD50, RAD51C, RAD51D, SDHA, SDHB, SDHC, SDHD, SMAD4, SMARCA4, STK11, TP53, TSC1, TSC2, VHL.   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  This is favorable.  I thought she had started anastrozole and the point of the visit today was to review side effects.  However she never started the medication.  She is reluctant to start for a variety of reasons some of which are clear and some of which are less clear.  We reviewed the fact that she has a noninvasive cancer which is not life-threatening and therefore anastrozole or any antiestrogen in this setting is optional.  It will reduce the risk of recurrence but will not affect survival.  She is going to continue to think about it and if she does start anastrozole she will let me know and we will discuss any side effects that may develop.  However at this point she appears likely to go for observation alone.  Accordingly she will see me in approximately a year, after her next mammography.  She knows to call for any other issue that may develop  Total encounter time 25 minutes.  Chauncey Cruel, MD   08/24/2020 6:43 PM Medical Oncology and Hematology [facility] / [facility], [place] [address] Tel. [phone]    Fax. [phone]   This document serves as a record of services personally performed by Lurline Del, MD. It was created on his behalf by Wilburn Mylar, a trained medical scribe. The creation of this  record is based on the scribe's personal observations and the provider's statements to them.   I, Lurline Del MD, have reviewed the above documentation for accuracy and completeness, and I agree with the above.   *Total Encounter Time as defined by the Centers for Medicare and Medicaid Services includes, in addition to the face-to-face time of a patient visit (documented in the note above) non-face-to-face time: obtaining and reviewing outside history, ordering and reviewing medications, tests or procedures, care coordination (communications with other health care professionals or caregivers) and documentation in the medical record.

## 2020-08-25 ENCOUNTER — Telehealth: Payer: Self-pay | Admitting: Oncology

## 2020-08-25 NOTE — Telephone Encounter (Signed)
Scheduled appts per 8/31 los. Left voicemail with appt date and time.

## 2020-08-31 ENCOUNTER — Telehealth (HOSPITAL_COMMUNITY): Payer: Self-pay

## 2020-08-31 NOTE — Telephone Encounter (Signed)
Called to schedule cta head/neck, no answer, left vm. AW

## 2020-09-15 ENCOUNTER — Other Ambulatory Visit (HOSPITAL_COMMUNITY): Payer: Self-pay | Admitting: Interventional Radiology

## 2020-09-15 ENCOUNTER — Telehealth (HOSPITAL_COMMUNITY): Payer: Self-pay

## 2020-09-15 DIAGNOSIS — I639 Cerebral infarction, unspecified: Secondary | ICD-10-CM

## 2020-09-15 NOTE — Telephone Encounter (Signed)
Third attempt to contact pt to schedule cta head/neck, no answer, left vm. AW

## 2020-09-22 ENCOUNTER — Other Ambulatory Visit: Payer: Self-pay | Admitting: Family Medicine

## 2020-09-22 ENCOUNTER — Other Ambulatory Visit: Payer: Self-pay | Admitting: Internal Medicine

## 2020-10-06 ENCOUNTER — Other Ambulatory Visit: Payer: Self-pay | Admitting: Family Medicine

## 2020-10-14 ENCOUNTER — Other Ambulatory Visit: Payer: Self-pay

## 2020-10-14 ENCOUNTER — Ambulatory Visit (HOSPITAL_COMMUNITY)
Admission: RE | Admit: 2020-10-14 | Discharge: 2020-10-14 | Disposition: A | Discharge status: Home / Self Care | Payer: BC Managed Care – PPO | Source: Ambulatory Visit | Attending: Interventional Radiology | Admitting: Interventional Radiology

## 2020-10-14 DIAGNOSIS — I639 Cerebral infarction, unspecified: Secondary | ICD-10-CM | POA: Insufficient documentation

## 2020-10-14 LAB — POCT I-STAT CREATININE: Creatinine, Ser: 0.7 mg/dL (ref 0.44–1.00)

## 2020-10-14 MED ORDER — IOHEXOL 350 MG/ML SOLN
100.0000 mL | Freq: Once | INTRAVENOUS | Status: AC | PRN
  Administered 2020-10-14: 60 mL via INTRAVENOUS

## 2020-10-18 ENCOUNTER — Telehealth (HOSPITAL_COMMUNITY): Payer: Self-pay | Admitting: Radiology

## 2020-10-18 NOTE — Telephone Encounter (Signed)
Called pt, left VM that recent scan was reviewed by Deveshwar. Remains stable and follow-up recommended for 1 year with CTA head/neck. JM

## 2020-10-20 ENCOUNTER — Encounter: Payer: Self-pay | Admitting: Internal Medicine

## 2020-10-20 ENCOUNTER — Ambulatory Visit: Payer: BC Managed Care – PPO | Admitting: Internal Medicine

## 2020-10-20 VITALS — BP 130/82 | HR 72 | Ht 64.0 in | Wt 203.0 lb

## 2020-10-20 DIAGNOSIS — Z23 Encounter for immunization: Secondary | ICD-10-CM | POA: Diagnosis not present

## 2020-10-20 DIAGNOSIS — K5010 Crohn's disease of large intestine without complications: Secondary | ICD-10-CM | POA: Diagnosis not present

## 2020-10-20 MED ORDER — MESALAMINE 1.2 G PO TBEC
DELAYED_RELEASE_TABLET | ORAL | 3 refills | Status: DC
Start: 2020-10-20 — End: 2021-10-19

## 2020-10-20 NOTE — Patient Instructions (Addendum)
We have given you your influenza vaccine today.  We have sent the following medications to your pharmacy for you to pick up at your convenience: Lialda 4.8 grams daily  Continue Humira  Your provider has requested that you go to the basement level for lab work on November 4th or November 5th. Press "B" on the elevator. The lab is located at the first door on the left as you exit the elevator.  If you are age 54 or younger, your body mass index should be between 19-25. Your Body mass index is 34.84 kg/m. If this is out of the aformentioned range listed, please consider follow up with your Primary Care Provider.   Due to recent changes in healthcare laws, you may see the results of your imaging and laboratory studies on MyChart before your provider has had a chance to review them.  We understand that in some cases there may be results that are confusing or concerning to you. Not all laboratory results come back in the same time frame and the provider may be waiting for multiple results in order to interpret others.  Please give [place] 48 hours in order for your provider to thoroughly review all the results before contacting the office for clarification of your results.
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
## 2020-10-22 ENCOUNTER — Encounter: Payer: Self-pay | Admitting: Family Medicine

## 2020-10-22 ENCOUNTER — Ambulatory Visit: Payer: BC Managed Care – PPO | Admitting: Family Medicine

## 2020-10-22 ENCOUNTER — Other Ambulatory Visit: Payer: Self-pay

## 2020-10-22 VITALS — BP 120/86 | HR 73 | Temp 98.3°F | Resp 18 | Ht 64.0 in | Wt 203.2 lb

## 2020-10-22 DIAGNOSIS — E785 Hyperlipidemia, unspecified: Secondary | ICD-10-CM | POA: Diagnosis not present

## 2020-10-22 DIAGNOSIS — I10 Essential (primary) hypertension: Secondary | ICD-10-CM

## 2020-10-22 IMAGING — CT CT ANGIO HEAD
1 of 12 series · 2 of 33 positions shown · IV contrast (Omnipaque)
Comparison: Plain head CT 6661 hours today.

CLINICAL DATA: 53-year-old female code stroke presentation with
sudden onset confusion.

EXAM:
CT ANGIOGRAPHY HEAD AND NECK
TECHNIQUE: Multidetector CT imaging of the head and neck was performed using
the standard protocol during bolus administration of intravenous
contrast. Multiplanar CT image reconstructions and MIPs were
obtained to evaluate the vascular anatomy. Carotid stenosis
measurements (when applicable) are obtained utilizing NASCET
criteria, using the distal internal carotid diameter as the
denominator.
CONTRAST:  100mL OMNIPAQUE IOHEXOL 350 MG/ML SOLN

[Series 11: axial thin · axial · 0.49mm/px · z∈[-4,+107]mm · 2 of 337 slices shown]
[im 113/337  soft-tissue]
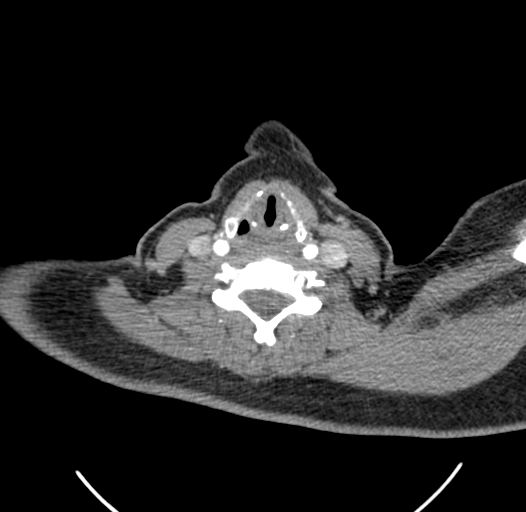
[im 225/337  bone]
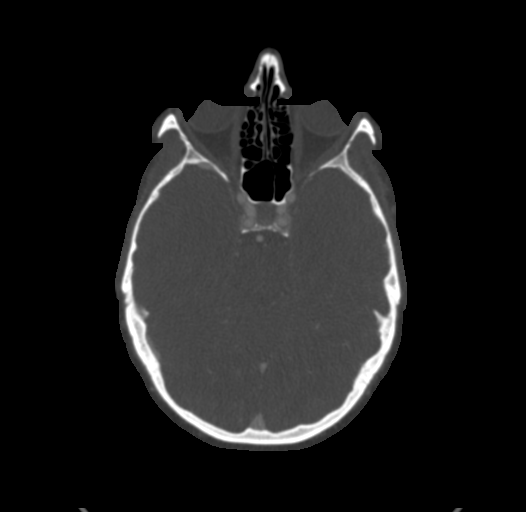

[2 of 33 positions shown; findings below may reference images not displayed]

FINDINGS: CTA NECK

Skeleton: Negative. Mild hyperostosis of the calvarium, normal
variant.

Upper chest: Negative. Visible central pulmonary artery appears
patent.

Other neck: Level 2 cervical lymph nodes are at the upper limits of
normal bilaterally, but other neck soft tissues are within normal
limits. No cystic or necrotic nodes identified.

Aortic arch: 3 vessel arch configuration with no arch
atherosclerosis.

Right carotid system: Negative aside from mildly tortuous right CCA
and moderately to severely tortuous right ICA just below the skull
base.

Left carotid system: Negative left CCA and left carotid bifurcation.
Moderately to severely tortuous left ICA just below the skull base.

Vertebral arteries:
Proximal right subclavian artery and right vertebral artery origin
are normal. Patent right vertebral artery to the skull base without
plaque or stenosis.

Proximal left subclavian artery and left vertebral artery origin are
normal. Fairly codominant vertebral arteries, the left is slightly
larger and patent to the skull base without stenosis.

CTA HEAD

Posterior circulation: No distal vertebral artery or vertebrobasilar
junction stenosis. Patent PICA origins. Patent basilar artery, AICA
and SCA origins. Normal basilar tip and PCA origins. Posterior
communicating arteries are diminutive or absent. Bilateral PCA
branches are within normal limits.

Anterior circulation: Both ICA siphons are patent without plaque or
stenosis. There is mild bilateral siphon dolichoectasia. Ophthalmic
artery origins appear normal. Patent carotid termini. Normal MCA and
ACA origins. Diminutive anterior communicating artery. Mildly
ectatic proximal ACA A2 segments (series 14, image 50 and series 16,
image 22. Bilateral ACA branches are within normal limits. Left MCA
M1 segment bifurcates early without stenosis. Left MCA branches are
tortuous and patent without stenosis but demonstrate mild segmental
fusiform enlargement in the distal M2 segments on series 16, image
29.

Right MCA M1 segment is tortuous and bifurcates early without
stenosis. The right MCA branches are patent with less irregularity
than those on the left side, although some mild long segment changes
in the distal posterior MCA division on series 16, image 14.

Venous sinuses: Patent.

Anatomic variants: Slightly dominant left vertebral artery.

Review of the MIP images confirms the above findings
IMPRESSION: 1. Negative for large vessel occlusion, and no atherosclerosis or
significant arterial stenosis identified in the head or neck.

2. But positive for generalized arterial tortuosity, which is severe
in the distal cervical ICAs, and associated with some segmental
fusiform vessel changes intracranially: bilateral proximal A2
segments, and left MCA M2/M3 segments.
The appearance is suspicious for an underlying connective tissue
disease.
Cerebral Vasculitis is also possible although felt less likely.
The vessel changes are not typical of Fibromuscular Dysplasia (FMD).

## 2020-10-22 MED ORDER — EZETIMIBE 10 MG PO TABS: ORAL_TABLET | ORAL | 3 refills | Status: DC

## 2020-10-22 MED ORDER — VASCEPA 1 G PO CAPS: 2.0000 g | ORAL_CAPSULE | Freq: Two times a day (BID) | ORAL | 3 refills | Status: DC

## 2020-10-22 NOTE — Patient Instructions (Signed)
DASH Eating Plan DASH stands for "Dietary Approaches to Stop Hypertension." The DASH eating plan is a healthy eating plan that has been shown to reduce high blood pressure (hypertension). It may also reduce your risk for type 2 diabetes, heart disease, and stroke. The DASH eating plan may also help with weight loss. What are tips for following this plan?  General guidelines  Avoid eating more than 2,300 mg (milligrams) of salt (sodium) a day. If you have hypertension, you may need to reduce your sodium intake to 1,500 mg a day.  Limit alcohol intake to no more than 1 drink a day for nonpregnant women and 2 drinks a day for men. One drink equals 12 oz of beer, 5 oz of wine, or 1 oz of hard liquor.  Work with your health care provider to maintain a healthy body weight or to lose weight. Ask what an ideal weight is for you.  Get at least 30 minutes of exercise that causes your heart to beat faster (aerobic exercise) most days of the week. Activities may include walking, swimming, or biking.  Work with your health care provider or diet and nutrition specialist (dietitian) to adjust your eating plan to your individual calorie needs. Reading food labels   Check food labels for the amount of sodium per serving. Choose foods with less than 5 percent of the Daily Value of sodium. Generally, foods with less than 300 mg of sodium per serving fit into this eating plan.  To find whole grains, look for the word "whole" as the first word in the ingredient list. Shopping  Buy products labeled as "low-sodium" or "no salt added."  Buy fresh foods. Avoid canned foods and premade or frozen meals. Cooking  Avoid adding salt when cooking. Use salt-free seasonings or herbs instead of table salt or sea salt. Check with your health care provider or pharmacist before using salt substitutes.  Do not fry foods. Cook foods using healthy methods such as baking, boiling, grilling, and broiling instead.  Cook with  heart-healthy oils, such as olive, canola, soybean, or sunflower oil. Meal planning  Eat a balanced diet that includes: ? 5 or more servings of fruits and vegetables each day. At each meal, try to fill half of your plate with fruits and vegetables. ? Up to 6-8 servings of whole grains each day. ? Less than 6 oz of lean meat, poultry, or fish each day. A 3-oz serving of meat is about the same size as a deck of cards. One egg equals 1 oz. ? 2 servings of low-fat dairy each day. ? A serving of nuts, seeds, or beans 5 times each week. ? Heart-healthy fats. Healthy fats called Omega-3 fatty acids are found in foods such as flaxseeds and coldwater fish, like sardines, salmon, and mackerel.  Limit how much you eat of the following: ? Canned or prepackaged foods. ? Food that is high in trans fat, such as fried foods. ? Food that is high in saturated fat, such as fatty meat. ? Sweets, desserts, sugary drinks, and other foods with added sugar. ? Full-fat dairy products.  Do not salt foods before eating.  Try to eat at least 2 vegetarian meals each week.  Eat more home-cooked food and less restaurant, buffet, and fast food.  When eating at a restaurant, ask that your food be prepared with less salt or no salt, if possible. What foods are recommended? The items listed may not be a complete list. Talk with your dietitian about   what dietary choices are best for you. Grains Whole-grain or whole-wheat bread. Whole-grain or whole-wheat pasta. Brown rice. Oatmeal. Quinoa. Bulgur. Whole-grain and low-sodium cereals. Pita bread. Low-fat, low-sodium crackers. Whole-wheat flour tortillas. Vegetables Fresh or frozen vegetables (raw, steamed, roasted, or grilled). Low-sodium or reduced-sodium tomato and vegetable juice. Low-sodium or reduced-sodium tomato sauce and tomato paste. Low-sodium or reduced-sodium canned vegetables. Fruits All fresh, dried, or frozen fruit. Canned fruit in natural juice (without  added sugar). Meat and other protein foods Skinless chicken or turkey. Ground chicken or turkey. Pork with fat trimmed off. Fish and seafood. Egg whites. Dried beans, peas, or lentils. Unsalted nuts, nut butters, and seeds. Unsalted canned beans. Lean cuts of beef with fat trimmed off. Low-sodium, lean deli meat. Dairy Low-fat (1%) or fat-free (skim) milk. Fat-free, low-fat, or reduced-fat cheeses. Nonfat, low-sodium ricotta or cottage cheese. Low-fat or nonfat yogurt. Low-fat, low-sodium cheese. Fats and oils Soft margarine without trans fats. Vegetable oil. Low-fat, reduced-fat, or light mayonnaise and salad dressings (reduced-sodium). Canola, safflower, olive, soybean, and sunflower oils. Avocado. Seasoning and other foods Herbs. Spices. Seasoning mixes without salt. Unsalted popcorn and pretzels. Fat-free sweets. What foods are not recommended? The items listed may not be a complete list. Talk with your dietitian about what dietary choices are best for you. Grains Baked goods made with fat, such as croissants, muffins, or some breads. Dry pasta or rice meal packs. Vegetables Creamed or fried vegetables. Vegetables in a cheese sauce. Regular canned vegetables (not low-sodium or reduced-sodium). Regular canned tomato sauce and paste (not low-sodium or reduced-sodium). Regular tomato and vegetable juice (not low-sodium or reduced-sodium). Pickles. Olives. Fruits Canned fruit in a light or heavy syrup. Fried fruit. Fruit in cream or butter sauce. Meat and other protein foods Fatty cuts of meat. Ribs. Fried meat. Bacon. Sausage. Bologna and other processed lunch meats. Salami. Fatback. Hotdogs. Bratwurst. Salted nuts and seeds. Canned beans with added salt. Canned or smoked fish. Whole eggs or egg yolks. Chicken or turkey with skin. Dairy Whole or 2% milk, cream, and half-and-half. Whole or full-fat cream cheese. Whole-fat or sweetened yogurt. Full-fat cheese. Nondairy creamers. Whipped toppings.  Processed cheese and cheese spreads. Fats and oils Butter. Stick margarine. Lard. Shortening. Ghee. Bacon fat. Tropical oils, such as coconut, palm kernel, or palm oil. Seasoning and other foods Salted popcorn and pretzels. Onion salt, garlic salt, seasoned salt, table salt, and sea salt. Worcestershire sauce. Tartar sauce. Barbecue sauce. Teriyaki sauce. Soy sauce, including reduced-sodium. Steak sauce. Canned and packaged gravies. Fish sauce. Oyster sauce. Cocktail sauce. Horseradish that you find on the shelf. Ketchup. Mustard. Meat flavorings and tenderizers. Bouillon cubes. Hot sauce and Tabasco sauce. Premade or packaged marinades. Premade or packaged taco seasonings. Relishes. Regular salad dressings. Where to find more information:  National Heart, Lung, and Blood Institute: www.nhlbi.nih.gov  American Heart Association: www.heart.org Summary  The DASH eating plan is a healthy eating plan that has been shown to reduce high blood pressure (hypertension). It may also reduce your risk for type 2 diabetes, heart disease, and stroke.  With the DASH eating plan, you should limit salt (sodium) intake to 2,300 mg a day. If you have hypertension, you may need to reduce your sodium intake to 1,500 mg a day.  When on the DASH eating plan, aim to eat more fresh fruits and vegetables, whole grains, lean proteins, low-fat dairy, and heart-healthy fats.  Work with your health care provider or diet and nutrition specialist (dietitian) to adjust your eating plan to your   individual calorie needs. This information is not intended to replace advice given to you by your health care provider. Make sure you discuss any questions you have with your health care provider. Document Revised: 11/23/2017 Document Reviewed: 12/04/2016 [employer] Patient Education  2020 [employer]  

## 2020-10-22 NOTE — Assessment & Plan Note (Signed)
Well controlled, no changes to meds. Encouraged heart healthy diet such as the DASH diet and exercise as tolerated.  °

## 2020-10-22 NOTE — Assessment & Plan Note (Signed)
Encouraged heart healthy diet, increase exercise, avoid trans fats, consider a krill oil cap daily 

## 2020-10-22 NOTE — Progress Notes (Signed)
Patient ID: Lindsey [name], female    DOB: 09/11/1966  Age: 54 y.o. MRN: [id number]    Subjective:  Subjective  HPI Lindsey [name] presents for f/u bp and cholesterol    No complaints   Review of Systems  Constitutional: Negative for appetite change, diaphoresis, fatigue and unexpected weight change.  Eyes: Negative for pain, redness and visual disturbance.  Respiratory: Negative for cough, chest tightness, shortness of breath and wheezing.   Cardiovascular: Negative for chest pain, palpitations and leg swelling.  Endocrine: Negative for cold intolerance, heat intolerance, polydipsia, polyphagia and polyuria.  Genitourinary: Negative for difficulty urinating, dysuria and frequency.  Neurological: Negative for dizziness, light-headedness, numbness and headaches.    History Past Medical History:  Diagnosis Date  . Allergy   . Anxiety   . Breast cancer ([place])   . Cancer ([place])   . Colitis   . Crohn's disease ([place])   . Depression   . Diverticulosis   . Hyperlipidemia   . Hypertension   . Mild intermittent asthma    rarely uses inhaler  . Nephrolithiasis   . Personal history of radiation therapy   . PONV (postoperative nausea and vomiting)   . Stroke [facility])     She has a past surgical history that includes LEEP; Lithotripsy; Wisdom tooth extraction; Combined hysteroscopy diagnostic / D&C; Breast lumpectomy with radioactive seed localization (Left, 12/02/2019); Re-excision of breast lumpectomy (Left, 12/24/2019); IR ANGIO INTRA EXTRACRAN SEL COM CAROTID INNOMINATE BILAT MOD SED (02/10/2020); IR US Guide Vasc Access Right (02/10/2020); IR ANGIO VERTEBRAL SEL VERTEBRAL BILAT MOD SED (02/10/2020); Breast biopsy (Left, 10/17/2019); and Breast lumpectomy (Left, 12/02/2019).   Her family history includes Allergic rhinitis in her mother; Alzheimer's disease in her father; Asthma in her brother; Breast cancer in her cousin; Dementia in her father; Diabetes in an other family  member; Food Allergy in her mother; Hyperlipidemia in an other family member; Hypertension in an other family member.She reports that she quit smoking about 24 years ago. Her smoking use included cigarettes. She has a 10.00 pack-year smoking history. She has never used smokeless tobacco. She reports current alcohol use. She reports that she does not use drugs.  Current Outpatient Medications on File Prior to Visit  Medication Sig Dispense Refill  . Adalimumab (HUMIRA PEN) 40 MG/0.4ML PNKT INJECT 1 PEN (40 MG) UNDER THE SKIN (SUBCUTANEOUS INJECTION) EVERY 14 DAYS 2 each 3  . albuterol (VENTOLIN HFA) 108 (90 Base) MCG/ACT inhaler Inhale 2 puffs into the lungs every 4 (four) hours as needed for wheezing or shortness of breath.    . ALPRAZolam (XANAX) 1 MG tablet Take 1-2 tablets thirty minutes prior to MRI.  May take one additional tablet before entering scanner, if needed.  MUST HAVE DRIVER. 3 tablet 0  . amLODipine (NORVASC) 5 MG tablet TAKE ONE (1) TABLET BY MOUTH EVERY DAY 90 tablet 0  . aspirin EC 81 MG EC tablet Take 1 tablet (81 mg total) by mouth daily.    Marland Kitchen desloratadine (CLARINEX) 5 MG tablet Take 5 mg by mouth daily.   4  . ergocalciferol (VITAMIN D2) 1.25 MG (50000 UT) capsule Take 50,000 Units by mouth every Thursday.     Marland Kitchen FLUoxetine (PROZAC) 40 MG capsule Take 1 capsule (40 mg total) by mouth daily. 90 capsule 3  . fluticasone (FLONASE) 50 MCG/ACT nasal spray Place 1 spray into both nostrils daily.    . fluticasone (FLOVENT HFA) 110 MCG/ACT inhaler Inhale 2 puffs into the lungs 2 (two)  times daily. 1 Inhaler 0  . indapamide (LOZOL) 1.25 MG tablet Take 1.25 mg by mouth daily.   9  . indomethacin (INDOCIN) 50 MG capsule Take 1 capsule (50 mg total) by mouth as directed. Use 30 mins prior to sex 30 capsule 0  . levocetirizine (XYZAL) 5 MG tablet Take 1 tablet (5 mg total) by mouth every evening. 30 tablet 0  . mesalamine (LIALDA) 1.2 g EC tablet TAKE TWO TABLETS BY MOUTH TWICE DAILY 360  tablet 3  . montelukast (SINGULAIR) 10 MG tablet Take 1 tablet (10 mg total) by mouth at bedtime. 30 tablet 0  . OVER THE COUNTER MEDICATION Take 7 drops by mouth 2 (two) times daily. CBD oil    . POTASSIUM CITRATE PO Take 108 mg by mouth 2 (two) times daily.    . vitamin B-12 (CYANOCOBALAMIN) 1000 MCG tablet Take 1,000 mcg by mouth daily.     No current facility-administered medications on file prior to visit.     Objective:  Objective  Physical Exam Vitals and nursing note reviewed.  Constitutional:      Appearance: She is well-developed.  HENT:     Head: Normocephalic and atraumatic.  Eyes:     Conjunctiva/sclera: Conjunctivae normal.  Neck:     Thyroid: No thyromegaly.     Vascular: No carotid bruit or JVD.  Cardiovascular:     Rate and Rhythm: Normal rate and regular rhythm.     Heart sounds: Normal heart sounds. No murmur heard.   Pulmonary:     Effort: Pulmonary effort is normal. No respiratory distress.     Breath sounds: Normal breath sounds. No wheezing or rales.  Chest:     Chest wall: No tenderness.  Musculoskeletal:     Cervical back: Normal range of motion and neck supple.  Neurological:     Mental Status: She is alert and oriented to person, place, and time.    BP 120/86 (BP Location: Right Arm, Patient Position: Sitting, Cuff Size: Large)   Pulse 73   Temp 98.3 F (36.8 C) (Oral)   Resp 18   Ht 5' 4"  (1.626 m)   Wt 203 lb 3.2 oz (92.2 kg)   SpO2 96%   BMI 34.88 kg/m  Wt Readings from Last 3 Encounters:  10/22/20 203 lb 3.2 oz (92.2 kg)  10/20/20 203 lb (92.1 kg)  02/24/20 201 lb 11.2 oz (91.5 kg)     Lab Results  Component Value Date   WBC 7.3 02/05/2020   HGB 13.3 02/10/2020   HCT 39.0 02/10/2020   PLT 266 02/05/2020   GLUCOSE 113 (H) 05/17/2020   CHOL 174 05/17/2020   TRIG 97.0 05/17/2020   HDL 54.40 05/17/2020   LDLDIRECT 123.2 12/20/2011   LDLCALC 100 (H) 05/17/2020   ALT 17 05/17/2020   AST 15 05/17/2020   NA 137 05/17/2020   K  3.5 05/17/2020   CL 102 05/17/2020   CREATININE 0.70 10/14/2020   BUN 22 05/17/2020   CO2 26 05/17/2020   TSH 2.653 02/02/2020   INR 1.0 02/05/2020   HGBA1C 5.9 (H) 02/02/2020    CT ANGIO HEAD W OR WO CONTRAST  Result Date: 10/15/2020 CLINICAL DATA:  54 year old female status post code stroke presentation in February, negative for large vessel occlusion but positive for tortuous another vessel changes raising the possibility of connective tissue disease. EXAM: CT ANGIOGRAPHY HEAD AND NECK TECHNIQUE: Multidetector CT imaging of the head and neck was performed using the standard protocol during  bolus administration of intravenous contrast. Multiplanar CT image reconstructions and MIPs were obtained to evaluate the vascular anatomy. Carotid stenosis measurements (when applicable) are obtained utilizing NASCET criteria, using the distal internal carotid diameter as the denominator. CONTRAST:  63m OMNIPAQUE IOHEXOL 350 MG/ML SOLN COMPARISON:  CTA head and neck 02/01/2020. Cerebral angiogram 02/10/2020. Brain MRI 02/02/2020. FINDINGS: CT HEAD Brain: No midline shift, ventriculomegaly, mass effect, evidence of mass lesion, intracranial hemorrhage or evidence of cortically based acute infarction. Gray-white matter differentiation is within normal limits throughout the brain. Calvarium and skull base: No acute osseous abnormality identified. Paranasal sinuses: Visualized paranasal sinuses and mastoids are stable and well pneumatized. Orbits: Visualized orbits and scalp soft tissues are within normal limits. CTA NECK Skeleton: Negative. Upper chest: Negative. Other neck: Negative. Aortic arch: 3 vessel arch configuration. Stable minimal arch atherosclerosis. Right carotid system: Negative right CCA aside from mild tortuosity. Negative right carotid bifurcation. Tortuous and somewhat dolichoectatic right ICA distal to the bulb is stable since February (series 17, image 23). No stenosis. Left carotid system:  Mildly tortuous left CCA is negative. Left carotid bifurcation is negative. Stable tortuous left ICA just below the skull base, with less fusiform dilatation than the opposite side. Vertebral arteries: Mild plaque at the right subclavian artery origin is stable without stenosis. Normal right vertebral artery origin. The cervical right vertebral is mildly irregular in the V3 segment without stenosis. Normal proximal left subclavian artery and left vertebral artery origin. Mildly dominant left vertebral is normal to the skull base. CTA HEAD Posterior circulation: Normal distal vertebral arteries. The left is mildly dominant. Patent PICA origins and vertebrobasilar junction. Patent basilar artery without stenosis. Normal SCA and PCA origins. Posterior communicating arteries are diminutive or absent. Bilateral PCA branches are within normal limits. Anterior circulation: Both ICA siphons are patent without plaque or stenosis. Minimal to mild ectasia. Carotid termini, MCA and ACA origins remain normal. Anterior communicating artery and bilateral ACA branches are stable, minimal to mild proximal A2 ectasia (series 18, image 19. MCA M1 segments and bifurcations are patent without stenosis. Tortuous right M1 as before. Normalized appearance of the left MCA branches compared to February (series 18, image 27 today versus series 16, image 29 previously). The right MCA branches were less abnormal before but still appear more uniform now on series 18, image 11. Venous sinuses: Patent. Anatomic variants: Unchanged slightly dominant left vertebral artery. Review of the MIP images confirms the above findings IMPRESSION: 1. Normalized appearance of the Left MCA branches since the February CTA. This reversibility suggest the possibility of Reversible Cerebral Vasoconstriction Syndrome (RCVS). CNS vasculitis seems less likely given the largely unremarkable MRI appearance of the brain in February. 2. However, there is unchanged  tortuosity and ectasia of the distal cervical ICAs, more so the right. And to a lesser extent the M1 and A2 segments. This may be due to more conventional causes such as chronic hypertension. No features strongly suggestive of fibromuscular Dysplasia (FMD) are identified. 3. No atherosclerosis or stenosis in the head or neck. Electronically Signed   By: HGenevie AnnM.D.   On: 10/15/2020 00:29   CT ANGIO NECK W OR WO CONTRAST  Result Date: 10/15/2020 CLINICAL DATA:  54year old female status post code stroke presentation in February, negative for large vessel occlusion but positive for tortuous another vessel changes raising the possibility of connective tissue disease. EXAM: CT ANGIOGRAPHY HEAD AND NECK TECHNIQUE: Multidetector CT imaging of the head and neck was performed using the standard protocol during  bolus administration of intravenous contrast. Multiplanar CT image reconstructions and MIPs were obtained to evaluate the vascular anatomy. Carotid stenosis measurements (when applicable) are obtained utilizing NASCET criteria, using the distal internal carotid diameter as the denominator. CONTRAST:  36m OMNIPAQUE IOHEXOL 350 MG/ML SOLN COMPARISON:  CTA head and neck 02/01/2020. Cerebral angiogram 02/10/2020. Brain MRI 02/02/2020. FINDINGS: CT HEAD Brain: No midline shift, ventriculomegaly, mass effect, evidence of mass lesion, intracranial hemorrhage or evidence of cortically based acute infarction. Gray-white matter differentiation is within normal limits throughout the brain. Calvarium and skull base: No acute osseous abnormality identified. Paranasal sinuses: Visualized paranasal sinuses and mastoids are stable and well pneumatized. Orbits: Visualized orbits and scalp soft tissues are within normal limits. CTA NECK Skeleton: Negative. Upper chest: Negative. Other neck: Negative. Aortic arch: 3 vessel arch configuration. Stable minimal arch atherosclerosis. Right carotid system: Negative right CCA aside from  mild tortuosity. Negative right carotid bifurcation. Tortuous and somewhat dolichoectatic right ICA distal to the bulb is stable since February (series 17, image 23). No stenosis. Left carotid system: Mildly tortuous left CCA is negative. Left carotid bifurcation is negative. Stable tortuous left ICA just below the skull base, with less fusiform dilatation than the opposite side. Vertebral arteries: Mild plaque at the right subclavian artery origin is stable without stenosis. Normal right vertebral artery origin. The cervical right vertebral is mildly irregular in the V3 segment without stenosis. Normal proximal left subclavian artery and left vertebral artery origin. Mildly dominant left vertebral is normal to the skull base. CTA HEAD Posterior circulation: Normal distal vertebral arteries. The left is mildly dominant. Patent PICA origins and vertebrobasilar junction. Patent basilar artery without stenosis. Normal SCA and PCA origins. Posterior communicating arteries are diminutive or absent. Bilateral PCA branches are within normal limits. Anterior circulation: Both ICA siphons are patent without plaque or stenosis. Minimal to mild ectasia. Carotid termini, MCA and ACA origins remain normal. Anterior communicating artery and bilateral ACA branches are stable, minimal to mild proximal A2 ectasia (series 18, image 19. MCA M1 segments and bifurcations are patent without stenosis. Tortuous right M1 as before. Normalized appearance of the left MCA branches compared to February (series 18, image 27 today versus series 16, image 29 previously). The right MCA branches were less abnormal before but still appear more uniform now on series 18, image 11. Venous sinuses: Patent. Anatomic variants: Unchanged slightly dominant left vertebral artery. Review of the MIP images confirms the above findings IMPRESSION: 1. Normalized appearance of the Left MCA branches since the February CTA. This reversibility suggest the possibility  of Reversible Cerebral Vasoconstriction Syndrome (RCVS). CNS vasculitis seems less likely given the largely unremarkable MRI appearance of the brain in February. 2. However, there is unchanged tortuosity and ectasia of the distal cervical ICAs, more so the right. And to a lesser extent the M1 and A2 segments. This may be due to more conventional causes such as chronic hypertension. No features strongly suggestive of fibromuscular Dysplasia (FMD) are identified. 3. No atherosclerosis or stenosis in the head or neck. Electronically Signed   By: HGenevie AnnM.D.   On: 10/15/2020 00:29     Assessment & Plan:  Plan  I am having CRed LakeA. Dietzman maintain her indapamide, vitamin B-12, montelukast, levocetirizine, desloratadine, fluticasone, ergocalciferol, albuterol, fluticasone, OVER THE COUNTER MEDICATION, aspirin, FLUoxetine, POTASSIUM CITRATE PO, indomethacin, ALPRAZolam, amLODipine, Humira Pen, mesalamine, Vascepa, and ezetimibe.  Meds ordered this encounter  Medications  . VASCEPA 1 g capsule    Sig: Take 2 capsules (  2 g total) by mouth 2 (two) times daily.    Dispense:  120 capsule    Refill:  3  . ezetimibe (ZETIA) 10 MG tablet    Sig: TAKE ONE (1) TABLET BY MOUTH EVERY DAY    Dispense:  90 tablet    Refill:  3    Problem List Items Addressed This Visit      Unprioritized   Essential hypertension    Well controlled, no changes to meds. Encouraged heart healthy diet such as the DASH diet and exercise as tolerated.        Relevant Medications   VASCEPA 1 g capsule   ezetimibe (ZETIA) 10 MG tablet   Hyperlipidemia   Relevant Medications   VASCEPA 1 g capsule   ezetimibe (ZETIA) 10 MG tablet   Other Relevant Orders   Lipid panel   Comprehensive metabolic panel   Hyperlipidemia LDL goal <100    Encouraged heart healthy diet, increase exercise, avoid trans fats, consider a krill oil cap daily      Relevant Medications   VASCEPA 1 g capsule   ezetimibe (ZETIA) 10 MG tablet      Other Visit Diagnoses    Primary hypertension    -  Primary   Relevant Medications   VASCEPA 1 g capsule   ezetimibe (ZETIA) 10 MG tablet      Follow-up: Return in about 6 months (around 04/22/2021), or if symptoms worsen or fail to improve, for annual exam, fasting.  Ann Held, DO

## 2020-10-23 LAB — COMPREHENSIVE METABOLIC PANEL
AG Ratio: 1.7 (calc) (ref 1.0–2.5)
ALT: 20 U/L (ref 6–29)
AST: 18 U/L (ref 10–35)
Albumin: 4.4 g/dL (ref 3.6–5.1)
Alkaline phosphatase (APISO): 78 U/L (ref 37–153)
BUN: 19 mg/dL (ref 7–25)
CO2: 30 mmol/L (ref 20–32)
Calcium: 9.3 mg/dL (ref 8.6–10.4)
Chloride: 100 mmol/L (ref 98–110)
Creat: 0.61 mg/dL (ref 0.50–1.05)
Globulin: 2.6 g/dL (calc) (ref 1.9–3.7)
Glucose, Bld: 111 mg/dL — ABNORMAL HIGH (ref 65–99)
Potassium: 3.6 mmol/L (ref 3.5–5.3)
Sodium: 140 mmol/L (ref 135–146)
Total Bilirubin: 0.7 mg/dL (ref 0.2–1.2)
Total Protein: 7 g/dL (ref 6.1–8.1)

## 2020-10-23 LAB — LIPID PANEL
Cholesterol: 194 mg/dL (ref <200)
HDL: 55 mg/dL (ref >=50)
LDL Cholesterol (Calc): 121 mg/dL (calc) — ABNORMAL HIGH
Non-HDL Cholesterol (Calc): 139 mg/dL (calc) — ABNORMAL HIGH (ref <130)
Total CHOL/HDL Ratio: 3.5 (calc) (ref <5.0)
Triglycerides: 81 mg/dL (ref <150)

## 2020-10-25 IMAGING — XA DG SPINAL PUNCT LUMBAR DIAG WITH FL CT GUIDANCE
1 series · 1 of 1 positions shown · non-contrast
Comparison: none

CLINICAL DATA: Transient memory loss.  Abnormal head MRI.

[Series 1: ortho adipose · 1 of 1 slices shown]
[im 1/1]
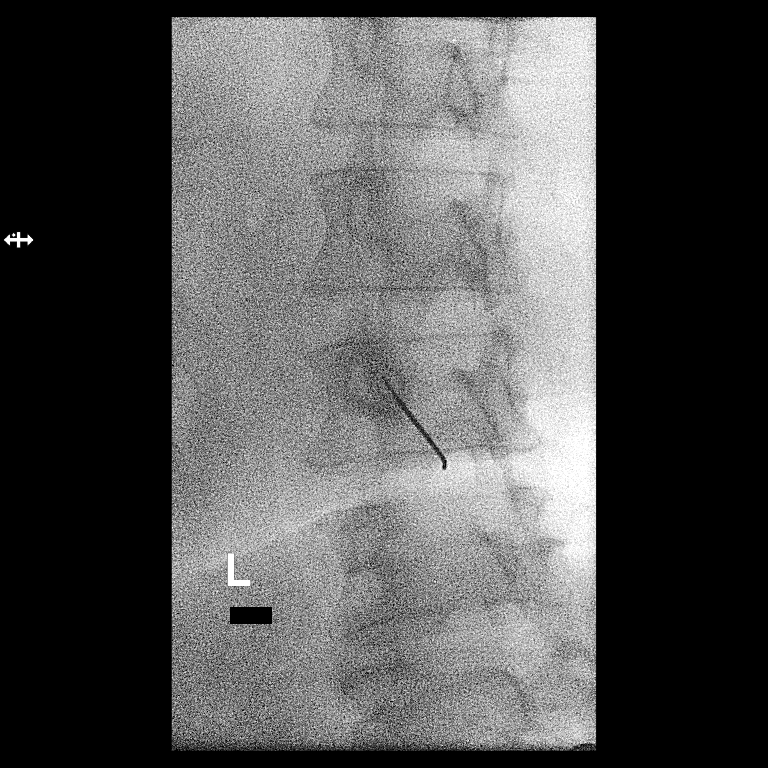

[1 of 1 positions shown; findings below may reference images not displayed]

EXAM:
DIAGNOSTIC LUMBAR PUNCTURE UNDER FLUOROSCOPIC GUIDANCE

FLUOROSCOPY TIME:  16 seconds; 66  uNym1 DAP

PROCEDURE:
Informed consent was obtained from the patient prior to the
procedure, including potential complications of headache, allergy,
and pain. With the patient in left lateral decubitus positioning,
the lower back was prepped with Betadine. 1% Lidocaine was used for
local anesthesia. Lumbar puncture was performed at the L3-4 level
using a 20 gauge needle with return of clear colorless CSF with an
opening pressure of 19 cm water. 8.5 ml of CSF were obtained for
laboratory studies. The patient tolerated the procedure well and
there were no apparent complications.
IMPRESSION: 1. Technically successful lumbar puncture under fluoroscopy

## 2020-10-29 ENCOUNTER — Other Ambulatory Visit (INDEPENDENT_AMBULATORY_CARE_PROVIDER_SITE_OTHER): Payer: BC Managed Care – PPO

## 2020-10-29 DIAGNOSIS — K5010 Crohn's disease of large intestine without complications: Secondary | ICD-10-CM

## 2020-10-29 LAB — CBC WITH DIFFERENTIAL/PLATELET
Basophils Absolute: 0 10*3/uL (ref 0.0–0.1)
Basophils Relative: 0.8 % (ref 0.0–3.0)
Eosinophils Absolute: 0.2 10*3/uL (ref 0.0–0.7)
Eosinophils Relative: 3.6 % (ref 0.0–5.0)
HCT: 43.5 % (ref 36.0–46.0)
Hemoglobin: 14.7 g/dL (ref 12.0–15.0)
Lymphocytes Relative: 23.5 % (ref 12.0–46.0)
Lymphs Abs: 1.3 10*3/uL (ref 0.7–4.0)
MCHC: 33.8 g/dL (ref 30.0–36.0)
MCV: 86.1 fl (ref 78.0–100.0)
Monocytes Absolute: 0.6 10*3/uL (ref 0.1–1.0)
Monocytes Relative: 10.4 % (ref 3.0–12.0)
Neutro Abs: 3.4 10*3/uL (ref 1.4–7.7)
Neutrophils Relative %: 61.7 % (ref 43.0–77.0)
Platelets: 266 10*3/uL (ref 150.0–400.0)
RBC: 5.05 Mil/uL (ref 3.87–5.11)
RDW: 14.3 % (ref 11.5–15.5)
WBC: 5.5 10*3/uL (ref 4.0–10.5)

## 2020-10-29 LAB — COMPREHENSIVE METABOLIC PANEL
ALT: 22 U/L (ref 0–35)
AST: 20 U/L (ref 0–37)
Albumin: 4.4 g/dL (ref 3.5–5.2)
Alkaline Phosphatase: 67 U/L (ref 39–117)
BUN: 23 mg/dL (ref 6–23)
CO2: 27 mEq/L (ref 19–32)
Calcium: 9.2 mg/dL (ref 8.4–10.5)
Chloride: 99 mEq/L (ref 96–112)
Creatinine, Ser: 0.64 mg/dL (ref 0.40–1.20)
GFR: 100.42 mL/min (ref >60.00)
Glucose, Bld: 121 mg/dL — ABNORMAL HIGH (ref 70–99)
Potassium: 3.2 mEq/L — ABNORMAL LOW (ref 3.5–5.1)
Sodium: 137 mEq/L (ref 135–145)
Total Bilirubin: 0.9 mg/dL (ref 0.2–1.2)
Total Protein: 7.5 g/dL (ref 6.0–8.3)

## 2020-10-31 LAB — QUANTIFERON-TB GOLD PLUS
Mitogen-NIL: 8.5 IU/mL
NIL: 0.01 IU/mL
QuantiFERON-TB Gold Plus: NEGATIVE
TB1-NIL: 0 IU/mL
TB2-NIL: 0 IU/mL

## 2020-11-05 LAB — ADALIMUMAB+AB (SERIAL MONITOR)
Adalimumab Drug Level: 5.5 ug/mL
Anti-Adalimumab Antibody: <25 ng/mL

## 2020-11-11 ENCOUNTER — Other Ambulatory Visit: Payer: Self-pay | Admitting: Family Medicine

## 2020-12-07 ENCOUNTER — Other Ambulatory Visit: Payer: Self-pay | Admitting: Internal Medicine

## 2021-01-23 ENCOUNTER — Other Ambulatory Visit: Payer: Self-pay | Admitting: Family Medicine

## 2021-02-07 ENCOUNTER — Other Ambulatory Visit: Payer: Self-pay | Admitting: Family Medicine

## 2021-02-07 DIAGNOSIS — F418 Other specified anxiety disorders: Secondary | ICD-10-CM

## 2021-03-03 ENCOUNTER — Ambulatory Visit: Payer: BC Managed Care – PPO | Admitting: Family Medicine

## 2021-03-03 ENCOUNTER — Encounter: Payer: Self-pay | Admitting: Family Medicine

## 2021-03-03 ENCOUNTER — Other Ambulatory Visit: Payer: Self-pay

## 2021-03-03 VITALS — BP 140/70 | HR 83 | Temp 98.1°F | Ht 64.0 in | Wt 205.4 lb

## 2021-03-03 DIAGNOSIS — J209 Acute bronchitis, unspecified: Secondary | ICD-10-CM | POA: Diagnosis not present

## 2021-03-03 MED ORDER — ALBUTEROL SULFATE HFA 108 (90 BASE) MCG/ACT IN AERS: 1.0000 | INHALATION_SPRAY | RESPIRATORY_TRACT | 3 refills | Status: AC | PRN

## 2021-03-03 MED ORDER — FLOVENT HFA 110 MCG/ACT IN AERO: 2.0000 | INHALATION_SPRAY | Freq: Two times a day (BID) | RESPIRATORY_TRACT | 0 refills | Status: AC

## 2021-03-03 MED ORDER — DOXYCYCLINE HYCLATE 100 MG PO CAPS: 100.0000 mg | ORAL_CAPSULE | Freq: Two times a day (BID) | ORAL | 0 refills | Status: DC

## 2021-03-03 NOTE — Progress Notes (Signed)
[place] at [facility] [address] [place], [place] [address] [phone]  Date:  03/03/2021   Name:  Lindsey [name]   DOB:  1966/12/20   MRN:  [id number]  PCP:  Ann Held, DO    Chief Complaint: Cough (Wheezing while lying down most of the time. Weyman Pedro drainage. Redmond Baseman vit D  levels )   History of Present Illness:  Lindsey [name] is a 55 y.o. very pleasant female patient who presents with the following:  Pt here today with concern of illness Pt of Dr Etter Sjogren- I have not seen her myself in the past  History of asthma, crohn's disease, CVA, HTN, breast cancer   Crohn's disease- GI is Dr Hilarie Fredrickson  1.  Crohn's colitis --she had had some mild activity of her Crohn's symptoms which has improved since she increased mesalamine dose.  We will continue current therapy at the higher dose of mesalamine.  I recommend we check Humira level and antibody. --Continue Humira 40 mg every 14 days --Continue Lialda 4.8 g daily --Check a adalimumab serum drug level and antibody test next Thursday or Friday at trough --CBC, CMP, quantiferon gold  Oncology Dr Jana Hakim  She is vaccinated against COVID-19  She has been feeling sick since Monday (today is Thursday)-started with a cough  She is getting allergy shots now-she does not have severe allergies Her sx started when she was in [place] last week- she drove home, no airplane No fever noted She tested negative for covid twice at home symptoms began Her cough is dry She notes that she tends to be bronchitis and this feels like her typical sx  She has albuterol on hand that she uses as needed -she notes her inhaler is old and expired She is not using any flovent -does not have any available  Patient Active Problem List   Diagnosis Date Noted  . Cerebral embolism with cerebral infarction 02/02/2020  . Acute encephalopathy 02/02/2020  . TIA (transient ischemic attack)  02/02/2020  . Acute confusion 02/01/2020  . Malignant neoplasm of upper-outer quadrant of left breast in female, estrogen receptor positive ([place]) 10/29/2019  . Hyperlipidemia 03/13/2019  . High risk medication use 03/13/2019  . Crohn's disease without complication ([place]) 42/87/6811  . Mild persistent asthma without complication 57/26/2035  . Crohn's colitis ([place]) 02/07/2017  . Mild intermittent asthma 08/23/2016  . Chronic rhinitis 08/23/2016  . Acute cystitis without hematuria 05/05/2015  . Nephrolithiasis 05/05/2015  . Paronychia 09/21/2014  . 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 Obesity (BMI 30-39.9) 05/14/2014  . 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 Idiopathic urticaria 02/23/2012  . TINEA PEDIS 11/24/2010  . DYSURIA 09/19/2010  . History of allergy 09/19/2010  . BACK PAIN 04/01/2010  . PAIN IN THORACIC SPINE 12/13/2009  . VITAMIN B12 DEFICIENCY 03/09/2009  . SINUSITIS - ACUTE-NOS 03/09/2009  . NEPHROLITHIASIS, HX OF 07/29/2008  . BACK STRAIN, LUMBAR 02/18/2008  . Hyperlipidemia LDL goal <100 11/05/2007  . Anxiety state 08/15/2007  . BRONCHITIS, ACUTE 08/15/2007  . FATIGUE 08/15/2007  . PALPITATIONS, OCCASIONAL 08/15/2007  . Essential hypertension 05/15/2007    Past Medical History:  Diagnosis Date  . Allergy   . Anxiety   . Breast cancer ([place])   . Cancer ([place])   . Colitis   . Crohn's disease ([place])   . Depression   . Diverticulosis   . Hyperlipidemia   . Hypertension   . Mild intermittent asthma    rarely  uses inhaler  . Nephrolithiasis   . Personal history of radiation therapy   . PONV (postoperative nausea and vomiting)   . Stroke [facility])     Past Surgical History:  Procedure Laterality Date  . BREAST BIOPSY Left 10/17/2019  . BREAST LUMPECTOMY Left 12/02/2019  . BREAST LUMPECTOMY WITH RADIOACTIVE SEED LOCALIZATION Left 12/02/2019   Procedure: LEFT BREAST LUMPECTOMY WITH RADIOACTIVE SEED LOCALIZATION;  Surgeon: Erroll Luna, MD;  Location: [place];  Service: General;  Laterality: Left;  . COMBINED HYSTEROSCOPY DIAGNOSTIC / D&C    . IR ANGIO INTRA EXTRACRAN SEL COM CAROTID INNOMINATE BILAT MOD SED  02/10/2020  . IR ANGIO VERTEBRAL SEL VERTEBRAL BILAT MOD SED  02/10/2020  . IR US GUIDE VASC ACCESS RIGHT  02/10/2020  . LEEP    . LITHOTRIPSY    . RE-EXCISION OF BREAST LUMPECTOMY Left 12/24/2019   Procedure: RE-EXCISION OF LEFT BREAST LUMPECTOMY;  Surgeon: Erroll Luna, MD;  Location: [place];  Service: General;  Laterality: Left;  . WISDOM TOOTH EXTRACTION      Social History   Tobacco Use  . Smoking status: Former Smoker    Packs/day: 1.00    Years: 10.00    Pack years: 10.00    Types: Cigarettes    Quit date: 12/26/1995    Years since quitting: 25.2  . Smokeless tobacco: Never Used  Vaping Use  . Vaping Use: Never used  Substance Use Topics  . Alcohol use: Yes    Comment: occasional wine   . Drug use: No    Family History  Problem Relation Age of Onset  . Dementia Father   . Alzheimer's disease Father   . Hyperlipidemia Other   . Hypertension Other   . Diabetes Other   . Allergic rhinitis Mother   . Food Allergy Mother        peanut  . Asthma Brother   . Breast cancer Cousin   . Crohn's disease Neg Hx   . Esophageal cancer Neg Hx   . Stomach cancer Neg Hx   . Rectal cancer Neg Hx   . Angioedema Neg Hx   . Eczema Neg Hx   . Immunodeficiency Neg Hx   . Urticaria Neg Hx     Allergies  Allergen Reactions  . Azithromycin Hives  . Nitrofurantoin Other (See Comments)    abd pain  . Tamiflu [Oseltamivir Phosphate] Hives    Medication list has been reviewed and updated.  Current Outpatient Medications on File Prior to Visit  Medication Sig Dispense Refill  . Adalimumab (HUMIRA PEN) 40 MG/0.4ML PNKT INJECT 40 MG (1 PEN) UNDER THE SKIN EVERY 14 DAYS 2 each 3  . albuterol (VENTOLIN HFA) 108 (90 Base) MCG/ACT inhaler Inhale 2 puffs into the lungs every 4 (four) hours as needed for  wheezing or shortness of breath.    . ALPRAZolam (XANAX) 1 MG tablet Take 1-2 tablets thirty minutes prior to MRI.  May take one additional tablet before entering scanner, if needed.  MUST HAVE DRIVER. 3 tablet 0  . amLODipine (NORVASC) 5 MG tablet Take 1 tablet (5 mg total) by mouth daily. 90 tablet 1  . aspirin EC 81 MG EC tablet Take 1 tablet (81 mg total) by mouth daily.    Marland Kitchen desloratadine (CLARINEX) 5 MG tablet Take 5 mg by mouth daily.   4  . ezetimibe (ZETIA) 10 MG tablet TAKE ONE (1) TABLET BY MOUTH EVERY DAY 90 tablet 3  . FLUoxetine (  PROZAC) 40 MG capsule Take 1 capsule (40 mg total) by mouth daily. Due for appt 02/2021 90 capsule 0  . fluticasone (FLONASE) 50 MCG/ACT nasal spray Place 1 spray into both nostrils daily.    . fluticasone (FLOVENT HFA) 110 MCG/ACT inhaler Inhale 2 puffs into the lungs 2 (two) times daily. 1 Inhaler 0  . indapamide (LOZOL) 1.25 MG tablet Take 1.25 mg by mouth daily.   9  . indomethacin (INDOCIN) 50 MG capsule Take 1 capsule (50 mg total) by mouth as directed. Use 30 mins prior to sex 30 capsule 0  . levocetirizine (XYZAL) 5 MG tablet Take 1 tablet (5 mg total) by mouth every evening. 30 tablet 0  . mesalamine (LIALDA) 1.2 g EC tablet TAKE TWO TABLETS BY MOUTH TWICE DAILY 360 tablet 3  . montelukast (SINGULAIR) 10 MG tablet Take 1 tablet (10 mg total) by mouth at bedtime. 30 tablet 0  . OVER THE COUNTER MEDICATION Take 7 drops by mouth 2 (two) times daily. CBD oil    . POTASSIUM CITRATE PO Take 108 mg by mouth 2 (two) times daily.    Marland Kitchen VASCEPA 1 g capsule Take 2 capsules (2 g total) by mouth 2 (two) times daily. 120 capsule 3  . vitamin B-12 (CYANOCOBALAMIN) 1000 MCG tablet Take 1,000 mcg by mouth daily.    . ergocalciferol (VITAMIN D2) 1.25 MG (50000 UT) capsule Take 50,000 Units by mouth every Thursday.  (Patient not taking: Reported on 03/03/2021)     No current facility-administered medications on file prior to visit.    Review of Systems:  As per  HPI- otherwise negative.   Physical Examination: Vitals:   03/03/21 1546  BP: 140/70  Pulse: 83  Temp: 98.1 F (36.7 C)  SpO2: 98%   Vitals:   03/03/21 1546  Weight: 205 lb 6.4 oz (93.2 kg)  Height: 5' 4"  (1.626 m)   Body mass index is 35.26 kg/m. Ideal Body Weight: Weight in (lb) to have BMI = 25: 145.3  GEN: no acute distress.  Obese, otherwise looks well HEENT: Atraumatic, Normocephalic.   Bilateral TM wnl, oropharynx normal.  PEERL,EOMI.   Ears and Nose: No external deformity. CV: RRR, No M/G/R. No JVD. No thrill. No extra heart sounds. PULM: CTA B, no wheezes, crackles, rhonchi. No retractions. No resp. distress. No accessory muscle use. ABD: S, NT, ND EXTR: No c/c/e PSYCH: Normally interactive. Conversant.    Assessment and Plan: Acute bronchitis, unspecified organism - Plan: fluticasone (FLOVENT HFA) 110 MCG/ACT inhaler, albuterol (VENTOLIN HFA) 108 (90 Base) MCG/ACT inhaler, doxycycline (VIBRAMYCIN) 100 MG capsule  Patient today with concern of possible bronchitis.  She noted onset of cough about 5 days ago, she notes that she tends towards bronchitis.  At this time her exam is relatively benign.  She has tested negative for COVID-19 at home.  She prefers to avoid oral oral steroids due to side effects, also potential interaction with other medications.  We will have her use albuterol as needed and Flovent inhaled steroid.  I did give her a prescription for doxycycline which she may fill if symptoms fail to improve in the next few days.  She is asked to please contact us if getting worse or if any other concerns  This visit occurred during the SARS-CoV-2 public health emergency.  Safety protocols were in place, including screening questions prior to the visit, additional usage of staff PPE, and extensive cleaning of exam room while observing appropriate contact time as indicated for  disinfecting solutions.    Signed Lamar Blinks, MD

## 2021-03-03 NOTE — Patient Instructions (Signed)
It was nice to meet you today!  We are going to use albuterol inhaler as needed for tightness and wheezing, and flovent twice a day for 1-2 weeks I also sent in a course of doxycycline for you to use for 10 days if your symptoms continue   Please let me know if you need any assistance or if you are not improving

## 2021-04-17 ENCOUNTER — Telehealth: Payer: Self-pay | Admitting: Emergency Medicine

## 2021-04-17 ENCOUNTER — Encounter: Payer: Self-pay | Admitting: Family Medicine

## 2021-04-17 ENCOUNTER — Emergency Department: Admit: 2021-04-17 | Discharge status: Against Medical Advice | Payer: Self-pay

## 2021-04-17 NOTE — Telephone Encounter (Signed)
Call to [facility] regarding her plan to be seen today - pt is using her inhaler more frequently (asthmatic) due to DOE, tested positive for COVID, doe not have a pulse ox at home - RN updated provider her today ( Dr Lysle Morales) - pt instructed to have a family member pick up at pulse oximeter from the store- is the readings are in the 90-92% range , she should proceed to the ED. Pt to go to ED if SOB becomes worse. [place]oya verbalized an understanding

## 2021-04-18 ENCOUNTER — Encounter: Payer: Self-pay | Admitting: Family Medicine

## 2021-04-18 ENCOUNTER — Telehealth: Payer: Self-pay

## 2021-04-18 DIAGNOSIS — U071 COVID-19: Secondary | ICD-10-CM

## 2021-04-18 MED ORDER — PAXLOVID 20 X 150 MG & 10 X 100MG PO TBPK: 3.0000 | ORAL_TABLET | Freq: Two times a day (BID) | ORAL | 0 refills | Status: DC

## 2021-04-18 NOTE — Telephone Encounter (Signed)
After Hours Call   Self Return Phone Number [phone] (Primary) Chief Complaint BREATHING - shortness of breath or sounds breathless Reason for Call Symptomatic / Request for Lindsey [name] states she is covid postive. Pt has a cough and sinus infection sxs. Pt does have asthma and is having some tightness.  Caller states she is covid postive-- tested this morning with home test. Symptoms began on Thursday. Pt has a dry cough, sinus pressure, headache. Pt does have asthma and is having some slight chest tightness-- Albuterol is relieving it.

## 2021-04-18 NOTE — Telephone Encounter (Signed)
Pt sent a mychart message asking how she should move forward. She has a dry cough- albuterol helps. She is concerned since she is on Humira for Crohn's.

## 2021-04-18 NOTE — Telephone Encounter (Signed)
Called pt- no answer.  LMOM and will send her a mychart message

## 2021-05-09 ENCOUNTER — Other Ambulatory Visit: Payer: Self-pay | Admitting: Family Medicine

## 2021-05-09 DIAGNOSIS — F418 Other specified anxiety disorders: Secondary | ICD-10-CM

## 2021-05-12 ENCOUNTER — Other Ambulatory Visit: Payer: Self-pay | Admitting: Family Medicine

## 2021-06-07 ENCOUNTER — Other Ambulatory Visit: Payer: Self-pay | Admitting: Family Medicine

## 2021-06-07 DIAGNOSIS — F418 Other specified anxiety disorders: Secondary | ICD-10-CM

## 2021-06-14 ENCOUNTER — Other Ambulatory Visit: Payer: Self-pay | Admitting: Family Medicine

## 2021-06-20 ENCOUNTER — Telehealth: Payer: Self-pay | Admitting: Family Medicine

## 2021-06-20 NOTE — Telephone Encounter (Signed)
Pt states she is needing orders put in for her standard labs that she gets regular however she is overdue. Pt states then she will make appt with Dr. Etter Sjogren to review after the results come in.

## 2021-06-20 NOTE — Telephone Encounter (Signed)
Pt requesting labs be placed prior to visit

## 2021-06-21 NOTE — Telephone Encounter (Signed)
Pt needs lab appointment please

## 2021-06-23 ENCOUNTER — Telehealth: Payer: Self-pay | Admitting: *Deleted

## 2021-06-23 ENCOUNTER — Other Ambulatory Visit: Payer: Self-pay | Admitting: Family Medicine

## 2021-06-23 DIAGNOSIS — E785 Hyperlipidemia, unspecified: Secondary | ICD-10-CM

## 2021-06-23 DIAGNOSIS — I10 Essential (primary) hypertension: Secondary | ICD-10-CM

## 2021-06-23 DIAGNOSIS — E538 Deficiency of other specified B group vitamins: Secondary | ICD-10-CM

## 2021-06-23 DIAGNOSIS — E559 Vitamin D deficiency, unspecified: Secondary | ICD-10-CM

## 2021-06-23 NOTE — Telephone Encounter (Signed)
Pt has lab appointment tomorrow morning. I do not see any future orders in Epic.  Please place future orders if appropriate or call pt to cancel appt.

## 2021-06-24 ENCOUNTER — Other Ambulatory Visit: Payer: Self-pay

## 2021-06-24 ENCOUNTER — Other Ambulatory Visit (INDEPENDENT_AMBULATORY_CARE_PROVIDER_SITE_OTHER): Payer: BC Managed Care – PPO

## 2021-06-24 DIAGNOSIS — E559 Vitamin D deficiency, unspecified: Secondary | ICD-10-CM

## 2021-06-24 DIAGNOSIS — E538 Deficiency of other specified B group vitamins: Secondary | ICD-10-CM | POA: Diagnosis not present

## 2021-06-24 DIAGNOSIS — I10 Essential (primary) hypertension: Secondary | ICD-10-CM | POA: Diagnosis not present

## 2021-06-24 DIAGNOSIS — E785 Hyperlipidemia, unspecified: Secondary | ICD-10-CM

## 2021-06-24 LAB — COMPREHENSIVE METABOLIC PANEL
ALT: 24 U/L (ref 0–35)
AST: 22 U/L (ref 0–37)
Albumin: 4.3 g/dL (ref 3.5–5.2)
Alkaline Phosphatase: 59 U/L (ref 39–117)
BUN: 18 mg/dL (ref 6–23)
CO2: 27 mEq/L (ref 19–32)
Calcium: 9 mg/dL (ref 8.4–10.5)
Chloride: 99 mEq/L (ref 96–112)
Creatinine, Ser: 0.74 mg/dL (ref 0.40–1.20)
GFR: 91.52 mL/min (ref >60.00)
Glucose, Bld: 107 mg/dL — ABNORMAL HIGH (ref 70–99)
Potassium: 3.5 mEq/L (ref 3.5–5.1)
Sodium: 136 mEq/L (ref 135–145)
Total Bilirubin: 0.8 mg/dL (ref 0.2–1.2)
Total Protein: 7.1 g/dL (ref 6.0–8.3)

## 2021-06-24 LAB — VITAMIN B12: Vitamin B-12: 587 pg/mL (ref 211–911)

## 2021-06-24 LAB — LIPID PANEL
Cholesterol: 192 mg/dL (ref 0–200)
HDL: 57.1 mg/dL (ref >39.00)
LDL Cholesterol: 116 mg/dL — ABNORMAL HIGH (ref 0–99)
NonHDL: 134.58
Total CHOL/HDL Ratio: 3
Triglycerides: 94 mg/dL (ref 0.0–149.0)
VLDL: 18.8 mg/dL (ref 0.0–40.0)

## 2021-06-24 LAB — VITAMIN D 25 HYDROXY (VIT D DEFICIENCY, FRACTURES): VITD: 34.48 ng/mL (ref 30.00–100.00)

## 2021-06-24 NOTE — Telephone Encounter (Signed)
There were 2 comprehensive metabolic panel orders in lab orders today. I drew a lavender tube in case you intended to place CBC order. Please review and advise?

## 2021-06-24 NOTE — Telephone Encounter (Signed)
Ok, thank you

## 2021-06-27 ENCOUNTER — Other Ambulatory Visit: Payer: Self-pay | Admitting: Family Medicine

## 2021-06-27 DIAGNOSIS — R739 Hyperglycemia, unspecified: Secondary | ICD-10-CM

## 2021-06-27 DIAGNOSIS — E1169 Type 2 diabetes mellitus with other specified complication: Secondary | ICD-10-CM

## 2021-06-27 DIAGNOSIS — E559 Vitamin D deficiency, unspecified: Secondary | ICD-10-CM

## 2021-07-07 ENCOUNTER — Other Ambulatory Visit: Payer: Self-pay | Admitting: Oncology

## 2021-07-07 DIAGNOSIS — Z09 Encounter for follow-up examination after completed treatment for conditions other than malignant neoplasm: Secondary | ICD-10-CM

## 2021-07-09 ENCOUNTER — Other Ambulatory Visit: Payer: Self-pay | Admitting: Family Medicine

## 2021-07-09 DIAGNOSIS — F418 Other specified anxiety disorders: Secondary | ICD-10-CM

## 2021-07-14 ENCOUNTER — Other Ambulatory Visit: Payer: Self-pay | Admitting: Family Medicine

## 2021-07-15 ENCOUNTER — Encounter: Payer: Self-pay | Admitting: Family Medicine

## 2021-07-15 MED ORDER — AMLODIPINE BESYLATE 5 MG PO TABS: ORAL_TABLET | ORAL | 0 refills | Status: DC

## 2021-07-25 ENCOUNTER — Telehealth: Payer: Self-pay | Admitting: Oncology

## 2021-07-25 NOTE — Telephone Encounter (Signed)
Scheduled appointment per provider. Left message.

## 2021-08-04 ENCOUNTER — Other Ambulatory Visit: Payer: Self-pay | Admitting: Internal Medicine

## 2021-08-08 ENCOUNTER — Other Ambulatory Visit: Payer: Self-pay | Admitting: Family Medicine

## 2021-08-08 DIAGNOSIS — E785 Hyperlipidemia, unspecified: Secondary | ICD-10-CM

## 2021-08-11 ENCOUNTER — Other Ambulatory Visit: Payer: Self-pay | Admitting: Family Medicine

## 2021-08-11 DIAGNOSIS — F418 Other specified anxiety disorders: Secondary | ICD-10-CM

## 2021-08-19 ENCOUNTER — Encounter: Payer: Self-pay | Admitting: Family Medicine

## 2021-08-22 ENCOUNTER — Other Ambulatory Visit: Payer: Self-pay | Admitting: Oncology

## 2021-08-22 DIAGNOSIS — Z853 Personal history of malignant neoplasm of breast: Secondary | ICD-10-CM

## 2021-08-25 ENCOUNTER — Ambulatory Visit: Payer: BC Managed Care – PPO | Admitting: Oncology

## 2021-08-25 ENCOUNTER — Other Ambulatory Visit: Payer: BC Managed Care – PPO

## 2021-08-30 ENCOUNTER — Other Ambulatory Visit: Payer: Self-pay

## 2021-08-30 ENCOUNTER — Other Ambulatory Visit: Payer: Self-pay | Admitting: Oncology

## 2021-08-30 ENCOUNTER — Ambulatory Visit
Admission: RE | Admit: 2021-08-30 | Discharge: 2021-08-30 | Disposition: A | Discharge status: Home / Self Care | Payer: BC Managed Care – PPO | Source: Ambulatory Visit | Attending: Oncology | Admitting: Oncology

## 2021-08-30 ENCOUNTER — Ambulatory Visit
Admission: RE | Admit: 2021-08-30 | Discharge: 2021-08-30 | Disposition: A | Discharge status: Home / Self Care | Payer: BC Managed Care – PPO | Source: Ambulatory Visit | Attending: Adult Health | Admitting: Adult Health

## 2021-08-30 DIAGNOSIS — Z853 Personal history of malignant neoplasm of breast: Secondary | ICD-10-CM

## 2021-08-30 DIAGNOSIS — N6489 Other specified disorders of breast: Secondary | ICD-10-CM

## 2021-08-30 DIAGNOSIS — N6310 Unspecified lump in the right breast, unspecified quadrant: Secondary | ICD-10-CM

## 2021-09-05 ENCOUNTER — Other Ambulatory Visit: Payer: Self-pay

## 2021-09-05 ENCOUNTER — Other Ambulatory Visit: Payer: Self-pay | Admitting: Family Medicine

## 2021-09-05 DIAGNOSIS — C50412 Malignant neoplasm of upper-outer quadrant of left female breast: Secondary | ICD-10-CM

## 2021-09-05 DIAGNOSIS — E785 Hyperlipidemia, unspecified: Secondary | ICD-10-CM

## 2021-09-05 DIAGNOSIS — F418 Other specified anxiety disorders: Secondary | ICD-10-CM

## 2021-09-05 NOTE — Progress Notes (Signed)
[place]  [phone] [phone]     ID: Lindsey [name] DOB: 1966-06-22  MR#: [id number]  GGE#:[id number]  Patient Care Team: Carollee Herter, Alferd Apa, DO as PCP - General Maisie Fus, MD as Consulting Physician (Obstetrics and Gynecology) Bobbitt, Sedalia Muta, MD as Consulting Physician (Allergy and Immunology) Erroll Luna, MD as Consulting Physician (General Surgery) Keneshia Tena, Virgie Dad, MD as Consulting Physician (Oncology) Pyrtle, Lajuan Lines, MD as Consulting Physician (Gastroenterology) Pamala Hurry, MD as Consulting Physician (Urology) Mauro Kaufmann, RN as Oncology Nurse Navigator Rockwell Germany, RN as Oncology Nurse Navigator Eppie Gibson, MD as Attending Physician (Radiation Oncology) Garvin Fila, MD as Consulting Physician (Neurology) Aurea Graff OTHER MD:  CHIEF COMPLAINT: noninvasive breast cancer, estrogen receptor positive  CURRENT TREATMENT:  Considering anastrozole   INTERVAL HISTORY: Lindsey [name] was scheduled today for follow up of her noninvasive breast cancer.  However I am moving her visit to next week so we can discuss the results of her biopsy today.  To review: She was prescribed anastrozole on 02/24/2020.  However she never started it.  She had too many questions in her own mind and did not see a clear advantage to it.  Since her last visit, she underwent bilateral diagnostic mammography with tomography and right breast ultrasonography at [facility] on 08/30/2021 showing: breast density category B; indeterminate right breast mass without ultrasound correlate; no suspicious right axillary lymphadenopathy; stable left breast posttreatment changes without mammographic evidence of malignancy.  She proceeded to biopsy of the right breast area in question earlier this morning. The results are pending.   REVIEW OF SYSTEMS: Lindsey [name] tells me she did well with the biopsy.  She is agreeable to  moving her visit with me today to next week so we can discuss results   COVID 19 VACCINATION STATUS: Moderna x2; infection 03/2021   HISTORY OF CURRENT ILLNESS: From the original intake note:  Lindsey [name] had routine screening mammography on 10/08/2017 showing a possible abnormality in the left breast. She underwent left diagnostic mammography with tomography and left breast ultrasonography at [facility] on 10/17/2017 showing: breast density category B; 9 mm indeterminate left breast mass without sonographic correlate. She was scheduled to undergo biopsy of the left breast mass on 10/22/2017, but the mass was not clearly seen on stereotactic imaging. Left diagnostic mammogram was performed instead and showed the mass as smaller and less conspicuous. Short term follow up was recommended.   She underwent left diagnostic mammogram and left breast ultrasonography on 01/23/2018 and bilateral diagnostic mammogram on 10/11/2018. When she returned for follow up bilateral diagnostic mammogram on 10/15/2019, this showed: breast density category B; new 6 mm group of calcifications within the upper-outer left breast; interval increase in size of lobular mass within the 12 o'clock position. Left breast ultrasonography performed that day measured the 12 o'clock left breast mass at 8 mm and showed no left axillary adenopathy.  Accordingly on 10/17/2019 she proceeded to biopsy of the left breast areas in question. 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   Pathology from the left 12 o'clock mass was negative for malignancy, showing fibrocystic changes with apocrine metaplasia. However, the biopsy clip was found to be located posterior to the  mammographically identified mass, so  she proceeded to repeat biopsy of the area on 10/30/2019. Pathology [phone]) was again benign, showing fibrocystic changes and fat necrosis.  She opted to proceed with left breast lumpectomy on 12/02/2019 under Dr. Brantley Stage. Pathology from the procedure ([id number]) showed: intermediate grade ductal carcinoma in situ, 0.6 cm; cauterized carcinoma in situ focally present at the lateral margin. Prognostic indicators significant for: estrogen receptor 90% positive with weak staining intensity, and progesterone receptor 100% positive with strong staining intensity.  The patient's subsequent history is as detailed below.   PAST MEDICAL HISTORY: Past Medical History:  Diagnosis Date   Allergy    Anxiety    Breast cancer ([place])    Cancer ([facility])    Colitis    Crohn's disease ([place])    Depression    Diverticulosis    Hyperlipidemia    Hypertension    Mild intermittent asthma    rarely uses inhaler   Nephrolithiasis    Personal history of radiation therapy    PONV (postoperative nausea and vomiting)    Stroke [facility])     PAST SURGICAL HISTORY: Past Surgical History:  Procedure Laterality Date   BREAST BIOPSY Left 10/17/2019   BREAST LUMPECTOMY Left 12/02/2019   BREAST LUMPECTOMY WITH RADIOACTIVE SEED LOCALIZATION Left 12/02/2019   Procedure: LEFT BREAST LUMPECTOMY WITH RADIOACTIVE SEED LOCALIZATION;  Surgeon: Erroll Luna, MD;  Location: [place];  Service: General;  Laterality: Left;   COMBINED HYSTEROSCOPY DIAGNOSTIC / D&C     IR ANGIO INTRA EXTRACRAN SEL COM CAROTID INNOMINATE BILAT MOD SED  02/10/2020   IR ANGIO VERTEBRAL SEL VERTEBRAL BILAT MOD SED  02/10/2020   IR US GUIDE VASC ACCESS RIGHT  02/10/2020   LEEP     LITHOTRIPSY     RE-EXCISION OF BREAST LUMPECTOMY Left 12/24/2019   Procedure: RE-EXCISION OF LEFT BREAST LUMPECTOMY;  Surgeon: Erroll Luna, MD;  Location: [place];  Service: General;  Laterality:  Left;   WISDOM TOOTH EXTRACTION      FAMILY HISTORY: Family History  Problem Relation Age of Onset   Dementia Father    Alzheimer's disease Father    Hyperlipidemia Other    Hypertension Other    Diabetes Other    Allergic rhinitis Mother    Food Allergy Mother        peanut   Asthma Brother    Breast cancer Cousin    Crohn's disease Neg Hx    Esophageal cancer Neg Hx    Stomach cancer Neg Hx    Rectal cancer Neg Hx    Angioedema Neg Hx    Eczema Neg Hx    Immunodeficiency Neg Hx    Urticaria Neg Hx   The patient's father died from Alzheimer's disease at age 2.  The patient's mother is 39 years old as of December 2020.  The patient denies a family hx of ovarian cancer. She reports breast cancer in a maternal cousin at age 85 (recurrent stage IV in her late 41s) and in a maternal great aunt. She also notes uterine cancer in her maternal grandmother, bladder cancer in an uncle, and colon cancer in her paternal grandmother.  The patient has 2 sisters, no brothers   GYNECOLOGIC HISTORY:  No LMP recorded. (Menstrual status: Perimenopausal). Menarche: 55 years old Age at first live birth: 55 years old GX P 3 LMP current, irregular, about every 2 months Contraceptive: Barrier methods HRT no Hysterectomy? no BSO?  No   SOCIAL HISTORY: (updated 11/2019)  Lindsey [name] is currently  working at the [employer].  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  Her husband Lindsey [name] works at [employer].  The patient has 3 children, Lindsey [name] 36 teaches physical education in [place], Lindsey [name] 17 and Lindsey [name] 15 are at home.  Lindsey [name] has a child Erasmo Downer from an earlier marriage.  She is 26     ADVANCED DIRECTIVES: In the absence of any documents to the contrary the patient's husband is her healthcare power of attorney   HEALTH MAINTENANCE: Social History   Tobacco Use   Smoking status: Former    Packs/day: 1.00    Years: 10.00    Pack years:  10.00    Types: Cigarettes    Quit date: 12/26/1995    Years since quitting: 25.7   Smokeless tobacco: Never  Vaping Use   Vaping Use: Never used  Substance Use Topics   Alcohol use: Yes    Comment: occasional wine    Drug use: No     Colonoscopy: 12/2018, repeat in 5 years  PAP: Per Dr. Nori Riis  Bone density: Remote   Allergies  Allergen Reactions   Azithromycin Hives   Nitrofurantoin Other (See Comments)    abd pain   Tamiflu [Oseltamivir Phosphate] Hives    Current Outpatient Medications  Medication Sig Dispense Refill   albuterol (VENTOLIN HFA) 108 (90 Base) MCG/ACT inhaler Inhale 1-2 puffs into the lungs every 4 (four) hours as needed for wheezing or shortness of breath. 18 g 3   ALPRAZolam (XANAX) 1 MG tablet Take 1-2 tablets thirty minutes prior to MRI.  May take one additional tablet before entering scanner, if needed.  MUST HAVE DRIVER. 3 tablet 0   amLODipine (NORVASC) 5 MG tablet TAKE ONE (1) TABLET BY MOUTH EVERY DAY 90 tablet 0   aspirin EC 81 MG EC tablet Take 1 tablet (81 mg total) by mouth daily.     desloratadine (CLARINEX) 5 MG tablet Take 5 mg by mouth daily.   4   doxycycline (VIBRAMYCIN) 100 MG capsule Take 1 capsule (100 mg total) by mouth 2 (two) times daily. 20 capsule 0   ergocalciferol (VITAMIN D2) 1.25 MG (50000 UT) capsule Take 50,000 Units by mouth every Thursday.  (Patient not taking: Reported on 03/03/2021)     ezetimibe (ZETIA) 10 MG tablet TAKE ONE (1) TABLET BY MOUTH EACH DAY. Pt need office visit for further refills. 30 tablet 0   FLUoxetine (PROZAC) 40 MG capsule TAKE ONE CAPSULE BY MOUTH DAILY 30 capsule 0   fluticasone (FLONASE) 50 MCG/ACT nasal spray Place 1 spray into both nostrils daily.     fluticasone (FLOVENT HFA) 110 MCG/ACT inhaler Inhale 2 puffs into the lungs 2 (two) times daily. 1 each 0   HUMIRA PEN 40 MG/0.4ML PNKT INJECT 40 MG (1 PEN) UNDER THE SKIN EVERY 14 DAYS 2 each 3   indapamide (LOZOL) 1.25 MG tablet Take 1.25 mg by mouth  daily.   9   indomethacin (INDOCIN) 50 MG capsule Take 1 capsule (50 mg total) by mouth as directed. Use 30 mins prior to sex 30 capsule 0   levocetirizine (XYZAL) 5 MG tablet Take 1 tablet (5 mg total) by mouth every evening. 30 tablet 0   mesalamine (LIALDA) 1.2 g EC tablet TAKE TWO TABLETS BY MOUTH TWICE DAILY 360 tablet 3   montelukast (SINGULAIR) 10 MG tablet Take 1 tablet (10 mg total) by mouth at bedtime. 30 tablet 0  Nirmatrelvir & Ritonavir (PAXLOVID) 20 x 150 MG & 10 x 100MG TBPK Take 3 tablets by mouth 2 (two) times daily. 1 each 0   OVER THE COUNTER MEDICATION Take 7 drops by mouth 2 (two) times daily. CBD oil     POTASSIUM CITRATE PO Take 108 mg by mouth 2 (two) times daily.     VASCEPA 1 g capsule Take 2 capsules (2 g total) by mouth 2 (two) times daily. 120 capsule 0   vitamin B-12 (CYANOCOBALAMIN) 1000 MCG tablet Take 1,000 mcg by mouth daily.     No current facility-administered medications for this visit.    OBJECTIVE: White woman who appears well  There were no vitals filed for this visit.   There is no height or weight on file to calculate BMI.   Wt Readings from Last 3 Encounters:  03/03/21 205 lb 6.4 oz (93.2 kg)  10/22/20 203 lb 3.2 oz (92.2 kg)  10/20/20 203 lb (92.1 kg)      ECOG FS:1 - Symptomatic but completely ambulatory     LAB RESULTS:  CMP     Component Value Date/Time   NA 136 06/24/2021 0717   K 3.5 06/24/2021 0717   CL 99 06/24/2021 0717   CO2 27 06/24/2021 0717   GLUCOSE 107 (H) 06/24/2021 0717   GLUCOSE 94 12/31/2006 1033   BUN 18 06/24/2021 0717   CREATININE 0.74 06/24/2021 0717   CREATININE 0.61 10/22/2020 1006   CALCIUM 9.0 06/24/2021 0717   PROT 7.1 06/24/2021 0717   ALBUMIN 4.3 06/24/2021 0717   AST 22 06/24/2021 0717   ALT 24 06/24/2021 0717   ALKPHOS 59 06/24/2021 0717   BILITOT 0.8 06/24/2021 0717   GFRNONAA >60 02/05/2020 0917   GFRAA >60 02/05/2020 0917    No results found for: TOTALPROTELP, ALBUMINELP, A1GS, A2GS,  BETS, BETA2SER, GAMS, MSPIKE, SPEI  Lab Results  Component Value Date   WBC 5.5 10/29/2020   NEUTROABS 3.4 10/29/2020   HGB 14.7 10/29/2020   HCT 43.5 10/29/2020   MCV 86.1 10/29/2020   PLT 266.0 10/29/2020    No results found for: LABCA2  No components found for: ZCHYIF027  No results for input(s): INR in the last 168 hours.  No results found for: LABCA2  No results found for: XAJ287  No results found for: OMV672  No results found for: CNO709  No results found for: CA2729  No components found for: HGQUANT  No results found for: CEA1 / No results found for: CEA1   No results found for: AFPTUMOR  No results found for: CHROMOGRNA  No results found for: KPAFRELGTCHN, LAMBDASER, KAPLAMBRATIO (kappa/lambda light chains)  No results found for: HGBA, HGBA2QUANT, HGBFQUANT, HGBSQUAN (Hemoglobinopathy evaluation)   No results found for: LDH  Lab Results  Component Value Date   IRON 101 09/06/2010   IRONPCTSAT 18.3 (L) 09/06/2010   (Iron and TIBC)  No results found for: FERRITIN  Urinalysis    Component Value Date/Time   COLORURINE YELLOW 02/01/2020 1247   APPEARANCEUR CLEAR 02/01/2020 1247   LABSPEC 1.010 02/01/2020 1247   PHURINE 7.5 02/01/2020 1247   GLUCOSEU NEGATIVE 02/01/2020 1247   GLUCOSEU NEGATIVE 05/08/2012 0811   HGBUR SMALL (A) 02/01/2020 1247   HGBUR large 09/19/2010 1245   BILIRUBINUR NEGATIVE 02/01/2020 1247   BILIRUBINUR neg 10/24/2016 0851   KETONESUR NEGATIVE 02/01/2020 1247   PROTEINUR NEGATIVE 02/01/2020 1247   UROBILINOGEN 0.2 10/24/2016 0851   UROBILINOGEN 0.2 01/05/2015 1143   NITRITE NEGATIVE 02/01/2020 1247  LEUKOCYTESUR NEGATIVE 02/01/2020 1247    STUDIES: US BREAST LTD UNI RIGHT INC AXILLA  Result Date: 08/30/2021 CLINICAL DATA:  55 year old female status post malignant left lumpectomy with radiation in 2020. EXAM: DIGITAL DIAGNOSTIC BILATERAL MAMMOGRAM WITH TOMOSYNTHESIS AND CAD; ULTRASOUND RIGHT BREAST LIMITED TECHNIQUE:  Bilateral digital diagnostic mammography and breast tomosynthesis was performed. The images were evaluated with computer-aided detection.; Targeted ultrasound examination of the right breast was performed COMPARISON:  Previous exam(s). ACR Breast Density Category b: There are scattered areas of fibroglandular density. FINDINGS: There has been interval development of an oval, partially circumscribed partially obscured mass in the deep central right breast is seen on the cc projection only. Otherwise, no new or suspicious findings in either breast. Stable posttreatment changes of the left breast. Targeted ultrasound is performed, showing no focal or suspicious sonographic abnormalities in the deep central right breast. Evaluation of the right axilla demonstrates no suspicious lymphadenopathy. IMPRESSION: 1. Indeterminate right breast mass without ultrasound correlate. Recommendation is for stereotactic biopsy. 2. No suspicious right axillary lymphadenopathy. 3. Stable left breast posttreatment changes without mammographic evidence of malignancy. RECOMMENDATION: Stereotactic biopsy of the right breast. I have discussed the findings and recommendations with the patient. If applicable, a reminder letter will be sent to the patient regarding the next appointment. BI-RADS CATEGORY  4: Suspicious. Electronically Signed   By: Kristopher Oppenheim M.D.   On: 08/30/2021 09:43  MM DIAG BREAST TOMO BILATERAL  Result Date: 08/30/2021 CLINICAL DATA:  55 year old female status post malignant left lumpectomy with radiation in 2020. EXAM: DIGITAL DIAGNOSTIC BILATERAL MAMMOGRAM WITH TOMOSYNTHESIS AND CAD; ULTRASOUND RIGHT BREAST LIMITED TECHNIQUE: Bilateral digital diagnostic mammography and breast tomosynthesis was performed. The images were evaluated with computer-aided detection.; Targeted ultrasound examination of the right breast was performed COMPARISON:  Previous exam(s). ACR Breast Density Category b: There are scattered areas of  fibroglandular density. FINDINGS: There has been interval development of an oval, partially circumscribed partially obscured mass in the deep central right breast is seen on the cc projection only. Otherwise, no new or suspicious findings in either breast. Stable posttreatment changes of the left breast. Targeted ultrasound is performed, showing no focal or suspicious sonographic abnormalities in the deep central right breast. Evaluation of the right axilla demonstrates no suspicious lymphadenopathy. IMPRESSION: 1. Indeterminate right breast mass without ultrasound correlate. Recommendation is for stereotactic biopsy. 2. No suspicious right axillary lymphadenopathy. 3. Stable left breast posttreatment changes without mammographic evidence of malignancy. RECOMMENDATION: Stereotactic biopsy of the right breast. I have discussed the findings and recommendations with the patient. If applicable, a reminder letter will be sent to the patient regarding the next appointment. BI-RADS CATEGORY  4: Suspicious. Electronically Signed   By: Kristopher Oppenheim M.D.   On: 08/30/2021 09:43     ELIGIBLE FOR AVAILABLE RESEARCH PROTOCOL: no  ASSESSMENT: 55 y.o. [place], [place] woman status post left lumpectomy 12/02/2019 for ductal carcinoma in situ, grade 2, measuring 0.6 cm, with a positive lateral margin  (a) successful additional surgery for margin clearance 12/24/2019  (1) adjuvant radiation 01/21/2020 - 02/18/2020  (2) anastrozole prescribed March 2021, never started by patient  (3) genetics testing 11/28/2019 at [facility] through the Common Hereditary Cancers Panel test by [facility] showed no deleterious mutations in APC, ATM, AXIN2, BARD1, BMPR1A, BRCA1, BRCA2, BRIP1, CDH1, CDK4, CDKN2A, CHEK2, CTNNA1, DICER1, EPCAM, GREM1, HOXB13, KIT, MEN1, MLH1, MSH2, MSH3, MSH6, MUTYH, NBN, NF1, NTHL1, PALB2, PDGFRA, PMS2, POLD1, POLE, PTEN, RAD50, RAD51C, RAD51D, SDHA, SDHB, SDHC, SDHD, SMAD4, SMARCA4, STK11, TP53, TSC1,  TSC2, VHL.   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  It makes more sense to postpone the visit with me until we have those results so she will see me on 09/15/2021.  I phoned her and she was in agreement with this plan.  She will have labs today since she is already in the parking lot.    Aurea Graff   09/05/2021 9:21 PM Medical Oncology and Hematology [facility] [place], [place] [address] Tel. [phone]    Fax. [phone]   This document serves as a record of services personally performed by Lurline Del, MD. It was created on his behalf by Wilburn Mylar, a trained medical scribe. The creation of this record is based on the scribe's personal observations and the provider's statements to them.   I, Lurline Del MD, have reviewed the above documentation for accuracy and completeness, and I agree with the above.   *Total Encounter Time as defined by the Centers for Medicare and Medicaid Services includes, in addition to the face-to-face time of a patient visit (documented in the note above) non-face-to-face time: obtaining and reviewing outside history, ordering and reviewing medications, tests or procedures, care coordination (communications with other health care professionals or caregivers) and documentation in the medical record.

## 2021-09-06 ENCOUNTER — Other Ambulatory Visit: Payer: Self-pay

## 2021-09-06 ENCOUNTER — Inpatient Hospital Stay: Payer: BC Managed Care – PPO | Attending: Oncology

## 2021-09-06 ENCOUNTER — Inpatient Hospital Stay (HOSPITAL_BASED_OUTPATIENT_CLINIC_OR_DEPARTMENT_OTHER): Payer: BC Managed Care – PPO | Admitting: Oncology

## 2021-09-06 ENCOUNTER — Ambulatory Visit
Admission: RE | Admit: 2021-09-06 | Discharge: 2021-09-06 | Disposition: A | Discharge status: Home / Self Care | Payer: BC Managed Care – PPO | Source: Ambulatory Visit | Attending: Adult Health | Admitting: Adult Health

## 2021-09-06 DIAGNOSIS — Z170 Estrogen receptor positive status [ER+]: Secondary | ICD-10-CM | POA: Diagnosis not present

## 2021-09-06 DIAGNOSIS — Z923 Personal history of irradiation: Secondary | ICD-10-CM | POA: Diagnosis not present

## 2021-09-06 DIAGNOSIS — E785 Hyperlipidemia, unspecified: Secondary | ICD-10-CM | POA: Insufficient documentation

## 2021-09-06 DIAGNOSIS — D0512 Intraductal carcinoma in situ of left breast: Secondary | ICD-10-CM | POA: Diagnosis not present

## 2021-09-06 DIAGNOSIS — Z7951 Long term (current) use of inhaled steroids: Secondary | ICD-10-CM | POA: Diagnosis not present

## 2021-09-06 DIAGNOSIS — Z79899 Other long term (current) drug therapy: Secondary | ICD-10-CM | POA: Diagnosis not present

## 2021-09-06 DIAGNOSIS — C50412 Malignant neoplasm of upper-outer quadrant of left female breast: Secondary | ICD-10-CM

## 2021-09-06 DIAGNOSIS — N6489 Other specified disorders of breast: Secondary | ICD-10-CM

## 2021-09-06 DIAGNOSIS — I10 Essential (primary) hypertension: Secondary | ICD-10-CM | POA: Insufficient documentation

## 2021-09-06 DIAGNOSIS — Z7982 Long term (current) use of aspirin: Secondary | ICD-10-CM | POA: Insufficient documentation

## 2021-09-06 LAB — CBC WITH DIFFERENTIAL (CANCER CENTER ONLY)
Abs Immature Granulocytes: 0.02 10*3/uL (ref 0.00–0.07)
Basophils Absolute: 0.1 10*3/uL (ref 0.0–0.1)
Basophils Relative: 1 %
Eosinophils Absolute: 0.3 10*3/uL (ref 0.0–0.5)
Eosinophils Relative: 4 %
HCT: 42.6 % (ref 36.0–46.0)
Hemoglobin: 14.6 g/dL (ref 12.0–15.0)
Immature Granulocytes: 0 %
Lymphocytes Relative: 27 %
Lymphs Abs: 2 10*3/uL (ref 0.7–4.0)
MCH: 29.9 pg (ref 26.0–34.0)
MCHC: 34.3 g/dL (ref 30.0–36.0)
MCV: 87.1 fL (ref 80.0–100.0)
Monocytes Absolute: 0.7 10*3/uL (ref 0.1–1.0)
Monocytes Relative: 10 %
Neutro Abs: 4.1 10*3/uL (ref 1.7–7.7)
Neutrophils Relative %: 58 %
Platelet Count: 265 10*3/uL (ref 150–400)
RBC: 4.89 MIL/uL (ref 3.87–5.11)
RDW: 13 % (ref 11.5–15.5)
WBC Count: 7.2 10*3/uL (ref 4.0–10.5)
nRBC: 0 % (ref 0.0–0.2)

## 2021-09-06 LAB — CMP (CANCER CENTER ONLY)
ALT: 23 U/L (ref 0–44)
AST: 22 U/L (ref 15–41)
Albumin: 4 g/dL (ref 3.5–5.0)
Alkaline Phosphatase: 71 U/L (ref 38–126)
Anion gap: 11 (ref 5–15)
BUN: 16 mg/dL (ref 6–20)
CO2: 27 mmol/L (ref 22–32)
Calcium: 9.6 mg/dL (ref 8.9–10.3)
Chloride: 101 mmol/L (ref 98–111)
Creatinine: 0.73 mg/dL (ref 0.44–1.00)
GFR, Estimated: >60 mL/min (ref >60)
Glucose, Bld: 135 mg/dL — ABNORMAL HIGH (ref 70–99)
Potassium: 2.8 mmol/L — ABNORMAL LOW (ref 3.5–5.1)
Sodium: 139 mmol/L (ref 135–145)
Total Bilirubin: 0.7 mg/dL (ref 0.3–1.2)
Total Protein: 7.5 g/dL (ref 6.5–8.1)

## 2021-09-06 LAB — VITAMIN D 25 HYDROXY (VIT D DEFICIENCY, FRACTURES): Vit D, 25-Hydroxy: 36.45 ng/mL (ref 30–100)

## 2021-09-08 LAB — HM PAP SMEAR: HPV, high-risk: NEGATIVE

## 2021-09-09 ENCOUNTER — Telehealth: Payer: Self-pay | Admitting: Hematology and Oncology

## 2021-09-09 ENCOUNTER — Telehealth: Payer: Self-pay | Admitting: Adult Health

## 2021-09-09 NOTE — Progress Notes (Deleted)
Lindsey [name]'s 9/13 Bx came back benign/concordant. I have cancelled her 9/22 appt and set her up fpr f/u 9/23; left voice mail and asked LC to call patient to clarify any questions

## 2021-09-09 NOTE — Telephone Encounter (Signed)
[facility] and she verified she received the message from dr. Jana Hakim.  Lindsey [name] verified this and has no questions.  She verbalized her next appt in one year with Dr. Chryl Heck.  She knows to call and see [place] if anything should arise in the interim.    Wilber Bihari, NP

## 2021-09-09 NOTE — Telephone Encounter (Signed)
Scheduled appt per 9/16 sch msg. Pt is aware.

## 2021-09-09 NOTE — Telephone Encounter (Signed)
-----   Message from Chauncey Cruel, MD sent at 09/09/2021  9:02 AM EDT ----- Kermit Balo morning Ria Comment! I am starting to clean up my insane schedule so you will get occasional notes like this. Ms Zuver Bx came back benign, she does not need to see me 9/22, I have cancelled that visit and set her up to see [place] 9/23; I called her and lefther a voice message.  If you can call her sometime today and make sure she got the message and got any quesitons answered, that would be great  Thanks!  GM

## 2021-09-15 ENCOUNTER — Inpatient Hospital Stay: Payer: BC Managed Care – PPO | Admitting: Oncology

## 2021-10-11 ENCOUNTER — Other Ambulatory Visit: Payer: Self-pay | Admitting: Family Medicine

## 2021-10-11 ENCOUNTER — Other Ambulatory Visit: Payer: Self-pay | Admitting: Internal Medicine

## 2021-10-11 DIAGNOSIS — F418 Other specified anxiety disorders: Secondary | ICD-10-CM

## 2021-10-14 ENCOUNTER — Encounter: Payer: Self-pay | Admitting: Family Medicine

## 2021-10-14 DIAGNOSIS — F418 Other specified anxiety disorders: Secondary | ICD-10-CM

## 2021-10-14 MED ORDER — FLUOXETINE HCL 40 MG PO CAPS: 40.0000 mg | ORAL_CAPSULE | Freq: Every day | ORAL | 0 refills | Status: DC

## 2021-10-19 ENCOUNTER — Telehealth: Payer: Self-pay | Admitting: Internal Medicine

## 2021-10-19 MED ORDER — MESALAMINE 1.2 G PO TBEC: DELAYED_RELEASE_TABLET | ORAL | 0 refills | Status: DC

## 2021-10-19 NOTE — Telephone Encounter (Signed)
Inbound call from pt stating that she has been out of refills for mesalamine for a week. Pt is schedule for appt on 11/07/21 at 2pm. Please advise. Thank you.

## 2021-10-19 NOTE — Telephone Encounter (Signed)
Patient did schedule appointment today (10/19/21) for follow up visit on 11/07/21. Rx for mesalamine has been filled until her visit.

## 2021-10-20 ENCOUNTER — Ambulatory Visit: Payer: BC Managed Care – PPO | Attending: Internal Medicine

## 2021-10-20 DIAGNOSIS — Z23 Encounter for immunization: Secondary | ICD-10-CM

## 2021-10-20 NOTE — Progress Notes (Signed)
   Covid-19 Vaccination Clinic  Name:  Lindsey [name]    MRN: [id number] DOB: February 25, 1966  10/20/2021  [name] was observed post Covid-19 immunization for 15 minutes without incident. She was provided with Vaccine Information Sheet and instruction to access the V-Safe system.   [name] was instructed to call 911 with any severe reactions post vaccine: Difficulty breathing  Swelling of face and throat  A fast heartbeat  A bad rash all over body  Dizziness and weakness   Immunizations Administered     Name Date Dose VIS Date Route   Moderna Covid-19 vaccine Bivalent Booster 10/20/2021  1:36 PM 0.5 mL 08/06/2021 Intramuscular   Manufacturer: Moderna   Lot: [id number]   [place]: [id number]

## 2021-10-24 ENCOUNTER — Ambulatory Visit: Payer: BC Managed Care – PPO | Admitting: Family Medicine

## 2021-10-25 ENCOUNTER — Other Ambulatory Visit: Payer: Self-pay

## 2021-10-25 ENCOUNTER — Encounter: Payer: Self-pay | Admitting: Family Medicine

## 2021-10-25 ENCOUNTER — Ambulatory Visit (INDEPENDENT_AMBULATORY_CARE_PROVIDER_SITE_OTHER): Payer: PRIVATE HEALTH INSURANCE | Admitting: Family Medicine

## 2021-10-25 VITALS — BP 120/86 | HR 81 | Temp 98.2°F | Resp 18 | Ht 64.0 in | Wt 203.8 lb

## 2021-10-25 DIAGNOSIS — R739 Hyperglycemia, unspecified: Secondary | ICD-10-CM

## 2021-10-25 DIAGNOSIS — Z23 Encounter for immunization: Secondary | ICD-10-CM

## 2021-10-25 DIAGNOSIS — I10 Essential (primary) hypertension: Secondary | ICD-10-CM | POA: Diagnosis not present

## 2021-10-25 DIAGNOSIS — E538 Deficiency of other specified B group vitamins: Secondary | ICD-10-CM | POA: Diagnosis not present

## 2021-10-25 DIAGNOSIS — E785 Hyperlipidemia, unspecified: Secondary | ICD-10-CM

## 2021-10-25 NOTE — Patient Instructions (Addendum)

## 2021-10-25 NOTE — Assessment & Plan Note (Signed)
con't diet  Check labs

## 2021-10-25 NOTE — Progress Notes (Addendum)
Subjective:   By signing my name below, I, Shehryar Baig, attest that this documentation has been prepared under the direction and in the presence of Dr. Roma Schanz, DO. 10/25/2021    Patient ID: Lindsey [name], female    DOB: [birth year], 55 y.o.   MRN: [id number]  Chief Complaint  Patient presents with   Hypertension   Follow-up    HPI Patient is in today for a office visit.   Oncology- She reports her oncologist prescribed her anti-estrogen medication but she has not started taking it yet.  Blood pressure- Her diastolic blood pressure is slightly elevated during this visit. She continues taking 5 mg amlodipine daily PO and reports no new issues while taking it.   BP Readings from Last 3 Encounters:  10/25/21 120/86  03/03/21 140/70  10/22/20 120/86   Pulse Readings from Last 3 Encounters:  10/25/21 81  03/03/21 83  10/22/20 73    Past Medical History:  Diagnosis Date   Allergy    Anxiety    Breast cancer ([place])    Cancer ([place])    Colitis    Crohn's disease ([place])    Depression    Diverticulosis    Hyperlipidemia    Hypertension    Mild intermittent asthma    rarely uses inhaler   Nephrolithiasis    Personal history of radiation therapy    PONV (postoperative nausea and vomiting)    Stroke [facility])     Past Surgical History:  Procedure Laterality Date   BREAST BIOPSY Left 10/17/2019   BREAST LUMPECTOMY Left 12/02/2019   BREAST LUMPECTOMY WITH RADIOACTIVE SEED LOCALIZATION Left 12/02/2019   Procedure: LEFT BREAST LUMPECTOMY WITH RADIOACTIVE SEED LOCALIZATION;  Surgeon: Erroll Luna, MD;  Location: [place];  Service: General;  Laterality: Left;   COMBINED HYSTEROSCOPY DIAGNOSTIC / D&C     IR ANGIO INTRA EXTRACRAN SEL COM CAROTID INNOMINATE BILAT MOD SED  02/10/2020   IR ANGIO VERTEBRAL SEL VERTEBRAL BILAT MOD SED  02/10/2020   IR US GUIDE VASC ACCESS RIGHT  02/10/2020   LEEP     LITHOTRIPSY     RE-EXCISION OF BREAST  LUMPECTOMY Left 12/24/2019   Procedure: RE-EXCISION OF LEFT BREAST LUMPECTOMY;  Surgeon: Erroll Luna, MD;  Location: [place];  Service: General;  Laterality: Left;   WISDOM TOOTH EXTRACTION      Family History  Problem Relation Age of Onset   Dementia Father    Alzheimer's disease Father    Hyperlipidemia Other    Hypertension Other    Diabetes Other    Allergic rhinitis Mother    Food Allergy Mother        peanut   Asthma Brother    Breast cancer Cousin    Crohn's disease Neg Hx    Esophageal cancer Neg Hx    Stomach cancer Neg Hx    Rectal cancer Neg Hx    Angioedema Neg Hx    Eczema Neg Hx    Immunodeficiency Neg Hx    Urticaria Neg Hx     Social History   Socioeconomic History   Marital status: Married    Spouse name: Not on file   Number of children: Not on file   Years of education: Not on file   Highest education level: Not on file  Occupational History   Not on file  Tobacco Use   Smoking status: Former    Packs/day: 1.00    Years: 10.00    Pack  years: 10.00    Types: Cigarettes    Quit date: 12/26/1995    Years since quitting: 25.8   Smokeless tobacco: Never  Vaping Use   Vaping Use: Never used  Substance and Sexual Activity   Alcohol use: Yes    Comment: occasional wine    Drug use: No   Sexual activity: Yes  Other Topics Concern   Not on file  Social History Narrative   Occupation: Babysit   Married   Regular exercise- yes   Social Determinants of Radio broadcast assistant Strain: Not on file  Food Insecurity: Not on file  Transportation Needs: Not on file  Physical Activity: Not on file  Stress: Not on file  Social Connections: Not on file  Intimate Partner Violence: Not on file    Outpatient Medications Prior to Visit  Medication Sig Dispense Refill   albuterol (VENTOLIN HFA) 108 (90 Base) MCG/ACT inhaler Inhale 1-2 puffs into the lungs every 4 (four) hours as needed for wheezing or shortness of breath. 18 g 3    ALPRAZolam (XANAX) 1 MG tablet Take 1-2 tablets thirty minutes prior to MRI.  May take one additional tablet before entering scanner, if needed.  MUST HAVE DRIVER. 3 tablet 0   amLODipine (NORVASC) 5 MG tablet TAKE ONE (1) TABLET BY MOUTH EVERY DAY 90 tablet 0   aspirin EC 81 MG EC tablet Take 1 tablet (81 mg total) by mouth daily.     desloratadine (CLARINEX) 5 MG tablet Take 5 mg by mouth daily.   4   ezetimibe (ZETIA) 10 MG tablet TAKE ONE (1) TABLET BY MOUTH EACH DAY. Pt need office visit for further refills. 30 tablet 0   FLUoxetine (PROZAC) 40 MG capsule Take 1 capsule (40 mg total) by mouth daily. 30 capsule 0   fluticasone (FLONASE) 50 MCG/ACT nasal spray Place 1 spray into both nostrils daily.     fluticasone (FLOVENT HFA) 110 MCG/ACT inhaler Inhale 2 puffs into the lungs 2 (two) times daily. 1 each 0   HUMIRA PEN 40 MG/0.4ML PNKT INJECT 40 MG (1 PEN) UNDER THE SKIN EVERY 14 DAYS 2 each 3   indapamide (LOZOL) 1.25 MG tablet Take 1.25 mg by mouth daily.   9   indomethacin (INDOCIN) 50 MG capsule Take 1 capsule (50 mg total) by mouth as directed. Use 30 mins prior to sex 30 capsule 0   levocetirizine (XYZAL) 5 MG tablet Take 1 tablet (5 mg total) by mouth every evening. 30 tablet 0   mesalamine (LIALDA) 1.2 g EC tablet TAKE TWO TABLETS BY MOUTH TWICE DAILY 120 tablet 0   montelukast (SINGULAIR) 10 MG tablet Take 1 tablet (10 mg total) by mouth at bedtime. 30 tablet 0   OVER THE COUNTER MEDICATION Take 7 drops by mouth 2 (two) times daily. CBD oil     POTASSIUM CITRATE PO Take 108 mg by mouth 2 (two) times daily.     VASCEPA 1 g capsule Take 2 capsules (2 g total) by mouth 2 (two) times daily. 120 capsule 0   vitamin B-12 (CYANOCOBALAMIN) 1000 MCG tablet Take 1,000 mcg by mouth daily.     doxycycline (VIBRAMYCIN) 100 MG capsule Take 1 capsule (100 mg total) by mouth 2 (two) times daily. (Patient not taking: Reported on 10/25/2021) 20 capsule 0   ergocalciferol (VITAMIN D2) 1.25 MG (50000  UT) capsule Take 50,000 Units by mouth every Thursday.  (Patient not taking: No sig reported)     Nirmatrelvir &  Ritonavir (PAXLOVID) 20 x 150 MG & 10 x 100MG TBPK Take 3 tablets by mouth 2 (two) times daily. (Patient not taking: Reported on 10/25/2021) 1 each 0   No facility-administered medications prior to visit.    Allergies  Allergen Reactions   Azithromycin Hives   Nitrofurantoin Other (See Comments)    abd pain   Tamiflu [Oseltamivir Phosphate] Hives    ROS     Objective:    Physical Exam Constitutional:      General: She is not in acute distress.    Appearance: Normal appearance. She is not ill-appearing.  HENT:     Head: Normocephalic and atraumatic.     Right Ear: External ear normal.     Left Ear: External ear normal.  Eyes:     Extraocular Movements: Extraocular movements intact.     Pupils: Pupils are equal, round, and reactive to light.  Cardiovascular:     Rate and Rhythm: Normal rate and regular rhythm.     Heart sounds: Normal heart sounds. No murmur heard.   No gallop.  Pulmonary:     Effort: Pulmonary effort is normal. No respiratory distress.     Breath sounds: Normal breath sounds. No wheezing or rales.  Skin:    General: Skin is warm and dry.  Neurological:     Mental Status: She is alert and oriented to person, place, and time.  Psychiatric:        Behavior: Behavior normal.        Judgment: Judgment normal.    BP 120/86 (BP Location: Left Arm, Patient Position: Sitting, Cuff Size: Large)   Pulse 81   Temp 98.2 F (36.8 C) (Oral)   Resp 18   Ht 5' 4"  (1.626 m)   Wt 203 lb 12.8 oz (92.4 kg)   SpO2 96%   BMI 34.98 kg/m  Wt Readings from Last 3 Encounters:  10/25/21 203 lb 12.8 oz (92.4 kg)  03/03/21 205 lb 6.4 oz (93.2 kg)  10/22/20 203 lb 3.2 oz (92.2 kg)    Diabetic Foot Exam - Simple   No data filed    Lab Results  Component Value Date   WBC 7.2 09/06/2021   HGB 14.6 09/06/2021   HCT 42.6 09/06/2021   PLT 265 09/06/2021    GLUCOSE 135 (H) 09/06/2021   CHOL 192 06/24/2021   TRIG 94.0 06/24/2021   HDL 57.10 06/24/2021   LDLDIRECT 123.2 12/20/2011   LDLCALC 116 (H) 06/24/2021   ALT 23 09/06/2021   AST 22 09/06/2021   NA 139 09/06/2021   K 2.8 (L) 09/06/2021   CL 101 09/06/2021   CREATININE 0.73 09/06/2021   BUN 16 09/06/2021   CO2 27 09/06/2021   TSH 2.653 02/02/2020   INR 1.0 02/05/2020   HGBA1C 5.9 (H) 02/02/2020    Lab Results  Component Value Date   TSH 2.653 02/02/2020   Lab Results  Component Value Date   WBC 7.2 09/06/2021   HGB 14.6 09/06/2021   HCT 42.6 09/06/2021   MCV 87.1 09/06/2021   PLT 265 09/06/2021   Lab Results  Component Value Date   NA 139 09/06/2021   K 2.8 (L) 09/06/2021   CO2 27 09/06/2021   GLUCOSE 135 (H) 09/06/2021   BUN 16 09/06/2021   CREATININE 0.73 09/06/2021   BILITOT 0.7 09/06/2021   ALKPHOS 71 09/06/2021   AST 22 09/06/2021   ALT 23 09/06/2021   PROT 7.5 09/06/2021   ALBUMIN 4.0 09/06/2021  CALCIUM 9.6 09/06/2021   ANIONGAP 11 09/06/2021   GFR 91.52 06/24/2021   Lab Results  Component Value Date   CHOL 192 06/24/2021   Lab Results  Component Value Date   HDL 57.10 06/24/2021   Lab Results  Component Value Date   LDLCALC 116 (H) 06/24/2021   Lab Results  Component Value Date   TRIG 94.0 06/24/2021   Lab Results  Component Value Date   CHOLHDL 3 06/24/2021   Lab Results  Component Value Date   HGBA1C 5.9 (H) 02/02/2020       Assessment & Plan:   Problem List Items Addressed This Visit       Unprioritized   B12 deficiency    Stable  Check labs      Relevant Orders   Vitamin B12   Hyperglycemia    con't diet  Check labs      Hyperlipidemia    Encourage heart healthy diet such as MIND or DASH diet, increase exercise, avoid trans fats, simple carbohydrates and processed foods, consider a krill or fish or flaxseed oil cap daily.       Primary hypertension - Primary    Well controlled, no changes to meds. Encouraged  heart healthy diet such as the DASH diet and exercise as tolerated.       Other Visit Diagnoses     Need for influenza vaccination       Relevant Orders   Flu Vaccine QUAD 77moIM (Fluarix, Fluzone & Alfiuria Quad PF) (Completed)        No orders of the defined types were placed in this encounter.   I, Dr. LRoma Schanz DO, personally preformed the services described in this documentation.  All medical record entries made by the scribe were at my direction and in my presence.  I have reviewed the chart and discharge instructions (if applicable) and agree that the record reflects my personal performance and is accurate and complete. 10/25/2021   I,Shehryar Baig,acting as a scribe for YAnn Held DO.,have documented all relevant documentation on the behalf of YAnn Held DO,as directed by  YAnn Held DO while in the presence of YAnn Held DO.   YAnn Held DO

## 2021-10-25 NOTE — Assessment & Plan Note (Signed)
Well controlled, no changes to meds. Encouraged heart healthy diet such as the DASH diet and exercise as tolerated.  °

## 2021-10-25 NOTE — Assessment & Plan Note (Signed)
Stable  Check labs

## 2021-10-25 NOTE — Assessment & Plan Note (Signed)
Encourage heart healthy diet such as MIND or DASH diet, increase exercise, avoid trans fats, simple carbohydrates and processed foods, consider a krill or fish or flaxseed oil cap daily.  °

## 2021-11-01 ENCOUNTER — Other Ambulatory Visit: Payer: Self-pay | Admitting: Family Medicine

## 2021-11-02 ENCOUNTER — Telehealth (HOSPITAL_COMMUNITY): Payer: Self-pay

## 2021-11-02 NOTE — Telephone Encounter (Signed)
Called to schedule cta head/neck, no answer, left vm. AW

## 2021-11-03 ENCOUNTER — Telehealth: Payer: Self-pay | Admitting: *Deleted

## 2021-11-03 NOTE — Telephone Encounter (Signed)
Pt has lab appt on Tuesday.  There is a future order from November and July.  Is it ok to do orders from both of these date?

## 2021-11-07 ENCOUNTER — Ambulatory Visit: Payer: PRIVATE HEALTH INSURANCE | Admitting: Gastroenterology

## 2021-11-08 ENCOUNTER — Other Ambulatory Visit: Payer: Self-pay

## 2021-11-08 ENCOUNTER — Other Ambulatory Visit (INDEPENDENT_AMBULATORY_CARE_PROVIDER_SITE_OTHER): Payer: PRIVATE HEALTH INSURANCE

## 2021-11-08 DIAGNOSIS — E1169 Type 2 diabetes mellitus with other specified complication: Secondary | ICD-10-CM | POA: Diagnosis not present

## 2021-11-08 DIAGNOSIS — R739 Hyperglycemia, unspecified: Secondary | ICD-10-CM

## 2021-11-08 DIAGNOSIS — E785 Hyperlipidemia, unspecified: Secondary | ICD-10-CM | POA: Diagnosis not present

## 2021-11-08 DIAGNOSIS — E559 Vitamin D deficiency, unspecified: Secondary | ICD-10-CM

## 2021-11-08 DIAGNOSIS — E538 Deficiency of other specified B group vitamins: Secondary | ICD-10-CM

## 2021-11-08 LAB — LIPID PANEL
Cholesterol: 205 mg/dL — ABNORMAL HIGH (ref 0–200)
HDL: 58.6 mg/dL (ref >39.00)
LDL Cholesterol: 121 mg/dL — ABNORMAL HIGH (ref 0–99)
NonHDL: 146.49
Total CHOL/HDL Ratio: 3
Triglycerides: 125 mg/dL (ref 0.0–149.0)
VLDL: 25 mg/dL (ref 0.0–40.0)

## 2021-11-08 LAB — COMPREHENSIVE METABOLIC PANEL
ALT: 32 U/L (ref 0–35)
AST: 24 U/L (ref 0–37)
Albumin: 4.3 g/dL (ref 3.5–5.2)
Alkaline Phosphatase: 61 U/L (ref 39–117)
BUN: 19 mg/dL (ref 6–23)
CO2: 29 mEq/L (ref 19–32)
Calcium: 9.6 mg/dL (ref 8.4–10.5)
Chloride: 101 mEq/L (ref 96–112)
Creatinine, Ser: 0.67 mg/dL (ref 0.40–1.20)
GFR: 98.61 mL/min (ref >60.00)
Glucose, Bld: 121 mg/dL — ABNORMAL HIGH (ref 70–99)
Potassium: 3.7 mEq/L (ref 3.5–5.1)
Sodium: 140 mEq/L (ref 135–145)
Total Bilirubin: 0.9 mg/dL (ref 0.2–1.2)
Total Protein: 7.1 g/dL (ref 6.0–8.3)

## 2021-11-08 LAB — VITAMIN D 25 HYDROXY (VIT D DEFICIENCY, FRACTURES): VITD: 39.13 ng/mL (ref 30.00–100.00)

## 2021-11-08 LAB — HEMOGLOBIN A1C: Hgb A1c MFr Bld: 6.8 % — ABNORMAL HIGH (ref 4.6–6.5)

## 2021-11-08 LAB — VITAMIN B12: Vitamin B-12: 633 pg/mL (ref 211–911)

## 2021-11-09 LAB — MICROALBUMIN, URINE: Microalb, Ur: 26.3 mg/dL

## 2021-11-11 ENCOUNTER — Other Ambulatory Visit: Payer: Self-pay | Admitting: Family Medicine

## 2021-11-11 DIAGNOSIS — F418 Other specified anxiety disorders: Secondary | ICD-10-CM

## 2021-11-12 ENCOUNTER — Other Ambulatory Visit: Payer: Self-pay | Admitting: Family Medicine

## 2021-11-12 DIAGNOSIS — E1165 Type 2 diabetes mellitus with hyperglycemia: Secondary | ICD-10-CM

## 2021-11-12 DIAGNOSIS — E785 Hyperlipidemia, unspecified: Secondary | ICD-10-CM

## 2021-11-14 ENCOUNTER — Encounter: Payer: Self-pay | Admitting: Family Medicine

## 2021-11-14 ENCOUNTER — Other Ambulatory Visit (HOSPITAL_BASED_OUTPATIENT_CLINIC_OR_DEPARTMENT_OTHER): Payer: Self-pay

## 2021-11-14 MED ORDER — MODERNA COVID-19 BIVAL BOOSTER 50 MCG/0.5ML IM SUSP
INTRAMUSCULAR | 0 refills | Status: DC
  Filled 2021-11-14: qty 0.5, 1d supply, fill #0

## 2021-11-24 ENCOUNTER — Other Ambulatory Visit: Payer: Self-pay | Admitting: Family Medicine

## 2021-11-24 ENCOUNTER — Other Ambulatory Visit: Payer: Self-pay | Admitting: Internal Medicine

## 2021-11-24 DIAGNOSIS — E785 Hyperlipidemia, unspecified: Secondary | ICD-10-CM

## 2021-11-25 ENCOUNTER — Encounter: Payer: Self-pay | Admitting: Gastroenterology

## 2021-11-25 ENCOUNTER — Other Ambulatory Visit: Payer: Self-pay | Admitting: Internal Medicine

## 2021-11-25 ENCOUNTER — Telehealth: Payer: Self-pay | Admitting: *Deleted

## 2021-11-25 ENCOUNTER — Ambulatory Visit: Payer: PRIVATE HEALTH INSURANCE | Admitting: Gastroenterology

## 2021-11-25 ENCOUNTER — Other Ambulatory Visit (INDEPENDENT_AMBULATORY_CARE_PROVIDER_SITE_OTHER): Payer: PRIVATE HEALTH INSURANCE

## 2021-11-25 VITALS — BP 126/84 | HR 81 | Ht 64.0 in | Wt 207.0 lb

## 2021-11-25 DIAGNOSIS — K5010 Crohn's disease of large intestine without complications: Secondary | ICD-10-CM

## 2021-11-25 LAB — SEDIMENTATION RATE: Sed Rate: 27 mm/hr (ref 0–30)

## 2021-11-25 LAB — C-REACTIVE PROTEIN: CRP: <1 mg/dL (ref 0.5–20.0)

## 2021-11-25 MED ORDER — MESALAMINE 1.2 G PO TBEC: DELAYED_RELEASE_TABLET | ORAL | 3 refills | Status: DC

## 2021-11-25 NOTE — Progress Notes (Signed)
11/25/2021 Lindsey [name] [id number] 10/18/1966   HISTORY OF PRESENT ILLNESS: This is a 55 year old female who is a patient of Dr. Vena Rua.  She follows here for Crohn's colitis diagnosed in 2017 maintained on Humira and Lialda 4.8 g daily.  She is here for follow-up.  She says that she needs refills on her Lialda.  She is still on Humira every other week.  Last year she had Humira antibodies and trough levels drawn.  She did not have any antibodies, but her trough levels were low.  Due to her improvement in symptoms with increase of mesalamine dosing they chose not to change her Humira dosing at that time.  She is here today stating that for the past few months she has been having more pain and more frequent, looser stools when she is close the time of her next injection.  No rectal bleeding.  Colonoscopy 12/2018:  - The examined portion of the ileum was normal. - Diverticulosis in the sigmoid colon and in the descending colon. - Colonic Crohn's disease in remission on Humira. - The distal rectum and anal verge are normal on retroflexion view  Repeat recommended in 5 years.  Past Medical History:  Diagnosis Date   Allergy    Anxiety    Breast cancer ([place])    Cancer ([place])    Colitis    Crohn's disease ([place])    Depression    Diverticulosis    Hyperlipidemia    Hypertension    Mild intermittent asthma    rarely uses inhaler   Nephrolithiasis    Personal history of radiation therapy    PONV (postoperative nausea and vomiting)    Stroke [facility])    Past Surgical History:  Procedure Laterality Date   BREAST BIOPSY Left 10/17/2019   BREAST LUMPECTOMY Left 12/02/2019   BREAST LUMPECTOMY WITH RADIOACTIVE SEED LOCALIZATION Left 12/02/2019   Procedure: LEFT BREAST LUMPECTOMY WITH RADIOACTIVE SEED LOCALIZATION;  Surgeon: Erroll Luna, MD;  Location: [place];  Service: General;  Laterality: Left;   COMBINED HYSTEROSCOPY DIAGNOSTIC / D&C     IR ANGIO INTRA  EXTRACRAN SEL COM CAROTID INNOMINATE BILAT MOD SED  02/10/2020   IR ANGIO VERTEBRAL SEL VERTEBRAL BILAT MOD SED  02/10/2020   IR US GUIDE VASC ACCESS RIGHT  02/10/2020   LEEP     LITHOTRIPSY     RE-EXCISION OF BREAST LUMPECTOMY Left 12/24/2019   Procedure: RE-EXCISION OF LEFT BREAST LUMPECTOMY;  Surgeon: Erroll Luna, MD;  Location: [place];  Service: General;  Laterality: Left;   WISDOM TOOTH EXTRACTION      reports that she quit smoking about 25 years ago. Her smoking use included cigarettes. She has a 10.00 pack-year smoking history. She has never used smokeless tobacco. She reports current alcohol use. She reports that she does not use drugs. family history includes Allergic rhinitis in her mother; Alzheimer's disease in her father; Asthma in her brother; Breast cancer in her cousin; Colon cancer in her paternal grandmother; Dementia in her father; Diabetes in an other family member; Food Allergy in her mother; Hyperlipidemia in an other family member; Hypertension in an other family member. Allergies  Allergen Reactions   Azithromycin Hives   Nitrofurantoin Other (See Comments)    abd pain   Tamiflu [Oseltamivir Phosphate] Hives      Outpatient Encounter Medications as of 11/25/2021  Medication Sig   albuterol (VENTOLIN HFA) 108 (90 Base) MCG/ACT inhaler Inhale 1-2 puffs into the lungs  every 4 (four) hours as needed for wheezing or shortness of breath.   ALPRAZolam (XANAX) 1 MG tablet Take 1-2 tablets thirty minutes prior to MRI.  May take one additional tablet before entering scanner, if needed.  MUST HAVE DRIVER.   amLODipine (NORVASC) 5 MG tablet TAKE 1 TABLET BY MOUTH DAILY   aspirin EC 81 MG EC tablet Take 1 tablet (81 mg total) by mouth daily.   COVID-19 mRNA bivalent vaccine, Moderna, (MODERNA COVID-19 BIVAL BOOSTER) 50 MCG/0.5ML injection Inject into the muscle.   desloratadine (CLARINEX) 5 MG tablet Take 5 mg by mouth daily.    ezetimibe (ZETIA) 10 MG tablet  TAKE ONE (1) TABLET BY MOUTH EACH DAY. Pt need office visit for further refills.   FLUoxetine (PROZAC) 40 MG capsule TAKE ONE (1) CAPSULE EACH DAY BY MOUTH   fluticasone (FLONASE) 50 MCG/ACT nasal spray Place 1 spray into both nostrils daily.   fluticasone (FLOVENT HFA) 110 MCG/ACT inhaler Inhale 2 puffs into the lungs 2 (two) times daily.   HUMIRA PEN 40 MG/0.4ML PNKT INJECT 40 MG (1 PEN) UNDER THE SKIN EVERY 14 DAYS   indapamide (LOZOL) 1.25 MG tablet Take 1.25 mg by mouth daily.    indomethacin (INDOCIN) 50 MG capsule Take 1 capsule (50 mg total) by mouth as directed. Use 30 mins prior to sex   levocetirizine (XYZAL) 5 MG tablet Take 1 tablet (5 mg total) by mouth every evening.   mesalamine (LIALDA) 1.2 g EC tablet TAKE TWO TABLETS BY MOUTH TWICE DAILY   montelukast (SINGULAIR) 10 MG tablet Take 1 tablet (10 mg total) by mouth at bedtime.   OVER THE COUNTER MEDICATION Take 7 drops by mouth 2 (two) times daily. CBD oil   POTASSIUM CITRATE PO Take 108 mg by mouth 2 (two) times daily.   VASCEPA 1 g capsule TAKE 2 CAPSULES BY MOUTH TWICE A DAY   vitamin B-12 (CYANOCOBALAMIN) 1000 MCG tablet Take 1,000 mcg by mouth daily.   No facility-administered encounter medications on file as of 11/25/2021.    REVIEW OF SYSTEMS  : All other systems reviewed and negative except where noted in the History of Present Illness.   PHYSICAL EXAM: BP 126/84   Pulse 81   Ht 5' 4"  (1.626 m)   Wt 207 lb (93.9 kg)   SpO2 98%   BMI 35.53 kg/m  General: Well developed white female in no acute distress Head: Normocephalic and atraumatic Eyes:  Sclerae anicteric, conjunctiva pink. Ears: Normal auditory acuity Lungs: Clear throughout to auscultation; no W/R/R. Heart: Regular rate and rhythm; no M/R/G. Abdomen: Soft, non-distended.  BS present.  Non-tender. Musculoskeletal: Symmetrical with no gross deformities  Skin: No lesions on visible extremities Extremities: No edema  Neurological: Alert oriented x 4,  grossly non-focal Psychological:  Alert and cooperative. Normal mood and affect  ASSESSMENT AND PLAN: 55 year old female with Crohn's colitis diagnosed in 2017 maintained on Humira and mesalamine who is here for follow-up.     1.  Crohn's colitis --she had some mild activity of her Crohn's symptoms which improved last year after she increased mesalamine dose.  We will continue current therapy at the higher dose of mesalamine.  Humira levels last year were low.  Patient is beginning to become symptomatic again close to the time of her next injection. --Continue Humira 40 mg every 14 days for now. --Continue Lialda 4.8 g daily.  Prescription sent to pharmacy. -- We will check quant from gold, sed rate, CRP, and  a fecal calprotectin. -- May benefit from weekly dosing.  We will await results of labs and stool studies and Dr. Vena Rua input.  I did not recheck adalimumab levels and antibodies today as we knew that her levels were already low last year. -- Repeat surveillance colonoscopy 12/2023.  CC:  Ann Held, *

## 2021-11-25 NOTE — Patient Instructions (Signed)
Your provider has requested that you go to the basement level for lab work before leaving today. Press "B" on the elevator. The lab is located at the first door on the left as you exit the elevator.  We have sent the following medications to your pharmacy for you to pick up at your convenience: Lialda   If you are age 55 or older, your body mass index should be between 23-30. Your Body mass index is 35.53 kg/m. If this is out of the aforementioned range listed, please consider follow up with your Primary Care Provider.  If you are age 29 or younger, your body mass index should be between 19-25. Your Body mass index is 35.53 kg/m. If this is out of the aformentioned range listed, please consider follow up with your Primary Care Provider.   ________________________________________________________  The [facility] providers would like to encourage you to use [facility] to communicate with providers for non-urgent requests or questions.  Due to long hold times on the telephone, sending your provider a message by [facility] may be a faster and more efficient way to get a response.  Please allow 48 business hours for a response.  Please remember that this is for non-urgent requests.  _______________________________________________________

## 2021-11-25 NOTE — Telephone Encounter (Signed)
Prior auth started via cover my meds.  Awaiting determination.  Key: [id number]

## 2021-11-28 ENCOUNTER — Other Ambulatory Visit: Payer: Self-pay | Admitting: Family Medicine

## 2021-11-28 DIAGNOSIS — E785 Hyperlipidemia, unspecified: Secondary | ICD-10-CM

## 2021-11-28 LAB — QUANTIFERON-TB GOLD PLUS
Mitogen-NIL: >10 IU/mL
NIL: 0.03 IU/mL
QuantiFERON-TB Gold Plus: NEGATIVE
TB1-NIL: 0 IU/mL
TB2-NIL: 0.01 IU/mL

## 2021-11-29 ENCOUNTER — Other Ambulatory Visit: Payer: Self-pay | Admitting: *Deleted

## 2021-11-29 DIAGNOSIS — E785 Hyperlipidemia, unspecified: Secondary | ICD-10-CM

## 2021-11-29 MED ORDER — ICOSAPENT ETHYL 1 G PO CAPS: 2.0000 g | ORAL_CAPSULE | Freq: Two times a day (BID) | ORAL | 5 refills | Status: DC

## 2021-11-29 NOTE — Telephone Encounter (Signed)
Insurance will cover generic.  Generic sent in.  Not sure why it was for Brand in the beginning (no notes stating why)

## 2021-11-30 ENCOUNTER — Telehealth: Payer: Self-pay | Admitting: *Deleted

## 2021-11-30 NOTE — Telephone Encounter (Signed)
Prior auth started via cover my meds.  Awaiting determination.  Key: [id number]

## 2021-12-02 ENCOUNTER — Other Ambulatory Visit: Payer: Self-pay

## 2021-12-02 MED ORDER — HUMIRA (2 PEN) 40 MG/0.4ML ~~LOC~~ AJKT: AUTO-INJECTOR | SUBCUTANEOUS | 11 refills | Status: DC

## 2021-12-05 NOTE — Progress Notes (Signed)
Addendum: Reviewed and agree with assessment and management plan. Agree, fecal calprotectin will be quite helpful in making management decisions.  More once this is available. Tondra Reierson, Lajuan Lines, MD

## 2021-12-06 ENCOUNTER — Other Ambulatory Visit: Payer: Self-pay

## 2021-12-06 ENCOUNTER — Encounter (HOSPITAL_BASED_OUTPATIENT_CLINIC_OR_DEPARTMENT_OTHER): Payer: Self-pay

## 2021-12-06 ENCOUNTER — Emergency Department (HOSPITAL_BASED_OUTPATIENT_CLINIC_OR_DEPARTMENT_OTHER): Payer: 59

## 2021-12-06 ENCOUNTER — Emergency Department (HOSPITAL_BASED_OUTPATIENT_CLINIC_OR_DEPARTMENT_OTHER)
Admission: EM | Admit: 2021-12-06 | Discharge: 2021-12-06 | Disposition: A | Discharge status: Home / Self Care | Payer: 59 | Attending: Emergency Medicine | Admitting: Emergency Medicine

## 2021-12-06 DIAGNOSIS — N200 Calculus of kidney: Secondary | ICD-10-CM | POA: Diagnosis not present

## 2021-12-06 DIAGNOSIS — Z7982 Long term (current) use of aspirin: Secondary | ICD-10-CM | POA: Insufficient documentation

## 2021-12-06 DIAGNOSIS — Z79899 Other long term (current) drug therapy: Secondary | ICD-10-CM | POA: Diagnosis not present

## 2021-12-06 DIAGNOSIS — I10 Essential (primary) hypertension: Secondary | ICD-10-CM | POA: Diagnosis not present

## 2021-12-06 DIAGNOSIS — Z7951 Long term (current) use of inhaled steroids: Secondary | ICD-10-CM | POA: Insufficient documentation

## 2021-12-06 DIAGNOSIS — Z853 Personal history of malignant neoplasm of breast: Secondary | ICD-10-CM | POA: Diagnosis not present

## 2021-12-06 DIAGNOSIS — J4530 Mild persistent asthma, uncomplicated: Secondary | ICD-10-CM | POA: Diagnosis not present

## 2021-12-06 DIAGNOSIS — R109 Unspecified abdominal pain: Secondary | ICD-10-CM | POA: Diagnosis present

## 2021-12-06 DIAGNOSIS — Z87891 Personal history of nicotine dependence: Secondary | ICD-10-CM | POA: Diagnosis not present

## 2021-12-06 LAB — CBC WITH DIFFERENTIAL/PLATELET
Abs Immature Granulocytes: 0.04 10*3/uL (ref 0.00–0.07)
Basophils Absolute: 0.1 10*3/uL (ref 0.0–0.1)
Basophils Relative: 1 %
Eosinophils Absolute: 0.4 10*3/uL (ref 0.0–0.5)
Eosinophils Relative: 4 %
HCT: 42.2 % (ref 36.0–46.0)
Hemoglobin: 14.6 g/dL (ref 12.0–15.0)
Immature Granulocytes: 0 %
Lymphocytes Relative: 20 %
Lymphs Abs: 2 10*3/uL (ref 0.7–4.0)
MCH: 29.8 pg (ref 26.0–34.0)
MCHC: 34.6 g/dL (ref 30.0–36.0)
MCV: 86.1 fL (ref 80.0–100.0)
Monocytes Absolute: 1 10*3/uL (ref 0.1–1.0)
Monocytes Relative: 9 %
Neutro Abs: 6.9 10*3/uL (ref 1.7–7.7)
Neutrophils Relative %: 66 %
Platelets: 302 10*3/uL (ref 150–400)
RBC: 4.9 MIL/uL (ref 3.87–5.11)
RDW: 13.1 % (ref 11.5–15.5)
WBC: 10.4 10*3/uL (ref 4.0–10.5)
nRBC: 0 % (ref 0.0–0.2)

## 2021-12-06 LAB — COMPREHENSIVE METABOLIC PANEL
ALT: 24 U/L (ref 0–44)
AST: 27 U/L (ref 15–41)
Albumin: 4.3 g/dL (ref 3.5–5.0)
Alkaline Phosphatase: 70 U/L (ref 38–126)
Anion gap: 12 (ref 5–15)
BUN: 17 mg/dL (ref 6–20)
CO2: 25 mmol/L (ref 22–32)
Calcium: 9.2 mg/dL (ref 8.9–10.3)
Chloride: 98 mmol/L (ref 98–111)
Creatinine, Ser: 0.82 mg/dL (ref 0.44–1.00)
GFR, Estimated: >60 mL/min (ref >60)
Glucose, Bld: 151 mg/dL — ABNORMAL HIGH (ref 70–99)
Potassium: 3 mmol/L — ABNORMAL LOW (ref 3.5–5.1)
Sodium: 135 mmol/L (ref 135–145)
Total Bilirubin: 0.5 mg/dL (ref 0.3–1.2)
Total Protein: 7.8 g/dL (ref 6.5–8.1)

## 2021-12-06 MED ORDER — ONDANSETRON HCL 4 MG PO TABS: 4.0000 mg | ORAL_TABLET | Freq: Four times a day (QID) | ORAL | 0 refills | Status: DC

## 2021-12-06 MED ORDER — ONDANSETRON HCL 4 MG/2ML IJ SOLN
4.0000 mg | Freq: Once | INTRAMUSCULAR | Status: AC
  Administered 2021-12-06: 4 mg via INTRAVENOUS
  Filled 2021-12-06: qty 2

## 2021-12-06 MED ORDER — KETOROLAC TROMETHAMINE 30 MG/ML IJ SOLN
30.0000 mg | Freq: Once | INTRAMUSCULAR | Status: AC
  Administered 2021-12-06: 30 mg via INTRAVENOUS
  Filled 2021-12-06: qty 1

## 2021-12-06 MED ORDER — MORPHINE SULFATE (PF) 4 MG/ML IV SOLN
4.0000 mg | Freq: Once | INTRAVENOUS | Status: AC
  Administered 2021-12-06: 4 mg via INTRAVENOUS
  Filled 2021-12-06: qty 1

## 2021-12-06 MED ORDER — SODIUM CHLORIDE 0.9 % IV BOLUS
1000.0000 mL | Freq: Once | INTRAVENOUS | Status: AC
Start: 2021-12-06 — End: 2021-12-06
  Administered 2021-12-06: 1000 mL via INTRAVENOUS

## 2021-12-06 MED ORDER — HYDROMORPHONE HCL 1 MG/ML IJ SOLN
1.0000 mg | Freq: Once | INTRAMUSCULAR | Status: AC
  Administered 2021-12-06: 1 mg via INTRAVENOUS
  Filled 2021-12-06: qty 1

## 2021-12-06 MED ORDER — HYDROCODONE-ACETAMINOPHEN 5-325 MG PO TABS: 2.0000 | ORAL_TABLET | ORAL | 0 refills | Status: DC | PRN

## 2021-12-06 MED ORDER — TAMSULOSIN HCL 0.4 MG PO CAPS: 0.4000 mg | ORAL_CAPSULE | Freq: Every day | ORAL | 0 refills | Status: DC

## 2021-12-06 NOTE — ED Provider Notes (Signed)
[name] Provider Note   CSN: [id number] Arrival date & time: 12/06/21  1639     History Chief Complaint  Patient presents with   Flank Pain    [name] is a 55 y.o. female.  HPI  Patient with history of frequent nephrolithiasis presents with left flank pain.  She is followed by [facility] urology.  Patient had a stent removed earlier this morning, the pain started a few hours after the procedure.  Its left flank pain, its constant and severe.  It was associated with 1 episode of emesis and nausea, no abdominal pain, dysuria, hematuria.  The stent was placed 1 week ago for nephrolithiasis, did not require lithotripsy.  Has tried ibuprofen which initially helped but she has been unable to take anymore secondary to vomiting.    Past Medical History:  Diagnosis Date   Allergy    Anxiety    Breast cancer ([place])    Cancer ([place])    Colitis    Crohn's disease ([place])    Depression    Diverticulosis    Hyperlipidemia    Hypertension    Mild intermittent asthma    rarely uses inhaler   Nephrolithiasis    Personal history of radiation therapy    PONV (postoperative nausea and vomiting)    Stroke [facility])     Patient Active Problem List   Diagnosis Date Noted   Hyperglycemia 10/25/2021   Cerebral embolism with cerebral infarction 02/02/2020   Acute encephalopathy 02/02/2020   TIA (transient ischemic attack) 02/02/2020   Acute confusion 02/01/2020   Malignant neoplasm of upper-outer quadrant of left breast in female, estrogen receptor positive ([place]) 10/29/2019   Hyperlipidemia 03/13/2019   High risk medication use 03/13/2019   Crohn's disease without complication ([place]) 92/42/6834   Mild persistent asthma without complication 19/62/2297   Crohn's colitis ([place]) 02/07/2017   Mild intermittent asthma 08/23/2016   Chronic rhinitis 08/23/2016   Acute cystitis without hematuria 05/05/2015   Nephrolithiasis 05/05/2015   Paronychia  09/21/2014   Calcium nephrolithiasis 06/03/2014   Obesity (BMI 30-39.9) 05/14/2014   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   Idiopathic urticaria 02/23/2012   TINEA PEDIS 11/24/2010   DYSURIA 09/19/2010   History of allergy 09/19/2010   BACK PAIN 04/01/2010   PAIN IN THORACIC SPINE 12/13/2009   B12 deficiency 03/09/2009   SINUSITIS - ACUTE-NOS 03/09/2009   NEPHROLITHIASIS, HX OF 07/29/2008   BACK STRAIN, LUMBAR 02/18/2008   Hyperlipidemia LDL goal <100 11/05/2007   Anxiety state 08/15/2007   BRONCHITIS, ACUTE 08/15/2007   FATIGUE 08/15/2007   PALPITATIONS, OCCASIONAL 08/15/2007   Primary hypertension 05/15/2007    Past Surgical History:  Procedure Laterality Date   BREAST BIOPSY Left 10/17/2019   BREAST LUMPECTOMY Left 12/02/2019   BREAST LUMPECTOMY WITH RADIOACTIVE SEED LOCALIZATION Left 12/02/2019   Procedure: LEFT BREAST LUMPECTOMY WITH RADIOACTIVE SEED LOCALIZATION;  Surgeon: Erroll Luna, MD;  Location: [place];  Service: General;  Laterality: Left;   COMBINED HYSTEROSCOPY DIAGNOSTIC / D&C     IR ANGIO INTRA EXTRACRAN SEL COM CAROTID INNOMINATE BILAT MOD SED  02/10/2020   IR ANGIO VERTEBRAL SEL VERTEBRAL BILAT MOD SED  02/10/2020   IR US GUIDE VASC ACCESS RIGHT  02/10/2020   LEEP     LITHOTRIPSY     RE-EXCISION OF BREAST LUMPECTOMY Left 12/24/2019   Procedure: RE-EXCISION OF LEFT BREAST LUMPECTOMY;  Surgeon: Erroll Luna, MD;  Location: [place];  Service: General;  Laterality: Left;   RENAL  ARTERY STENT Left    WISDOM TOOTH EXTRACTION       OB History   No obstetric history on file.     Family History  Problem Relation Age of Onset   Allergic rhinitis Mother    Food Allergy Mother        peanut   Dementia Father    Alzheimer's disease Father    Asthma Brother    Colon cancer Paternal Grandmother    Breast cancer Cousin    Hyperlipidemia Other    Hypertension Other    Diabetes Other    Crohn's disease Neg Hx    Esophageal  cancer Neg Hx    Stomach cancer Neg Hx    Rectal cancer Neg Hx    Angioedema Neg Hx    Eczema Neg Hx    Immunodeficiency Neg Hx    Urticaria Neg Hx     Social History   Tobacco Use   Smoking status: Former    Packs/day: 1.00    Years: 10.00    Pack years: 10.00    Types: Cigarettes    Quit date: 12/26/1995    Years since quitting: 25.9   Smokeless tobacco: Never  Vaping Use   Vaping Use: Never used  Substance Use Topics   Alcohol use: Yes    Comment: occasional wine    Drug use: No    Home Medications Prior to Admission medications   Medication Sig Start Date End Date Taking? Authorizing Provider  HYDROcodone-acetaminophen (NORCO/VICODIN) 5-325 MG tablet Take 2 tablets by mouth every 4 (four) hours as needed. 12/06/21  Yes Sherrill Raring, PA-C  ondansetron (ZOFRAN) 4 MG tablet Take 1 tablet (4 mg total) by mouth every 6 (six) hours. 12/06/21  Yes Sherrill Raring, PA-C  tamsulosin (FLOMAX) 0.4 MG CAPS capsule Take 1 capsule (0.4 mg total) by mouth daily after supper. 12/06/21  Yes Sherrill Raring, PA-C  Adalimumab (HUMIRA PEN) 40 MG/0.4ML PNKT INJECT 40 MG (1 PEN) UNDER THE SKIN EVERY 14 DAYS 12/02/21   Pyrtle, Lajuan Lines, MD  albuterol (VENTOLIN HFA) 108 (90 Base) MCG/ACT inhaler Inhale 1-2 puffs into the lungs every 4 (four) hours as needed for wheezing or shortness of breath. 03/03/21   Copland, Gay Filler, MD  ALPRAZolam Duanne Moron) 1 MG tablet Take 1-2 tablets thirty minutes prior to MRI.  May take one additional tablet before entering scanner, if needed.  MUST HAVE DRIVER. 07/27/14   Marcial Pacas, MD  amLODipine (NORVASC) 5 MG tablet TAKE 1 TABLET BY MOUTH DAILY 11/01/21   Carollee Herter, Alferd Apa, DO  aspirin EC 81 MG EC tablet Take 1 tablet (81 mg total) by mouth daily. 02/03/20   Black, Lezlie Octave, NP  COVID-19 mRNA bivalent vaccine, Moderna, (MODERNA COVID-19 BIVAL BOOSTER) 50 MCG/0.5ML injection Inject into the muscle. 10/20/21   Carlyle Basques, MD  desloratadine (CLARINEX) 5 MG tablet Take 5 mg by  mouth daily.  08/29/18   [provider]  ezetimibe (ZETIA) 10 MG tablet TAKE ONE (1) TABLET BY MOUTH EACH DAY 11/28/21   Carollee Herter, Yvonne R, DO  FLUoxetine (PROZAC) 40 MG capsule TAKE ONE (1) CAPSULE EACH DAY BY MOUTH 11/11/21   Carollee Herter, Yvonne R, DO  fluticasone (FLONASE) 50 MCG/ACT nasal spray Place 1 spray into both nostrils daily.    [provider]  fluticasone (FLOVENT HFA) 110 MCG/ACT inhaler Inhale 2 puffs into the lungs 2 (two) times daily. 03/03/21   Copland, Gay Filler, MD  icosapent Ethyl (VASCEPA) 1 g  capsule Take 2 capsules (2 g total) by mouth 2 (two) times daily. 11/29/21   Ann Held, DO  indapamide (LOZOL) 1.25 MG tablet Take 1.25 mg by mouth daily.  11/22/17   [provider]  indomethacin (INDOCIN) 50 MG capsule Take 1 capsule (50 mg total) by mouth as directed. Use 30 mins prior to sex 02/19/20   Garvin Fila, MD  levocetirizine (XYZAL) 5 MG tablet Take 1 tablet (5 mg total) by mouth every evening. 06/04/18   Charlies Silvers, MD  mesalamine (LIALDA) 1.2 g EC tablet TAKE TWO TABLETS BY MOUTH TWICE DAILY 11/25/21   Zehr, Janett Billow D, PA-C  montelukast (SINGULAIR) 10 MG tablet Take 1 tablet (10 mg total) by mouth at bedtime. 06/04/18   Charlies Silvers, MD  OVER THE COUNTER MEDICATION Take 7 drops by mouth 2 (two) times daily. CBD oil    [provider]  POTASSIUM CITRATE PO Take 108 mg by mouth 2 (two) times daily.    [provider]  vitamin B-12 (CYANOCOBALAMIN) 1000 MCG tablet Take 1,000 mcg by mouth daily.    [provider]    Allergies    Azithromycin, Nitrofurantoin, and Tamiflu [oseltamivir phosphate]  Review of Systems   Review of Systems  Constitutional:  Negative for fever.  HENT:  Negative for ear pain and sore throat.   Respiratory:  Negative for cough and shortness of breath.   Cardiovascular:  Negative for chest pain and palpitations.  Gastrointestinal:  Positive for nausea and vomiting.  Negative for abdominal pain.  Genitourinary:  Positive for flank pain. Negative for dysuria and hematuria.  Musculoskeletal:  Negative for arthralgias and back pain.  Skin:  Negative for color change and rash.  Neurological:  Negative for seizures and syncope.  All other systems reviewed and are negative.  Physical Exam Updated Vital Signs BP (!) 117/53 (BP Location: Left Arm)    Pulse 78    Temp 98 F (36.7 C) (Oral)    Resp 18    Ht 5' 4"  (1.626 m)    Wt 93.9 kg    SpO2 99%    BMI 35.53 kg/m   Physical Exam Vitals and nursing note reviewed. Exam conducted with a chaperone present.  Constitutional:      Appearance: Normal appearance.  HENT:     Head: Normocephalic and atraumatic.  Eyes:     General: No scleral icterus.       Right eye: No discharge.        Left eye: No discharge.     Extraocular Movements: Extraocular movements intact.     Pupils: Pupils are equal, round, and reactive to light.  Cardiovascular:     Rate and Rhythm: Normal rate and regular rhythm.     Pulses: Normal pulses.     Heart sounds: Normal heart sounds. No murmur heard.   No friction rub. No gallop.  Pulmonary:     Effort: Pulmonary effort is normal. No respiratory distress.     Breath sounds: Normal breath sounds.  Abdominal:     General: Abdomen is flat. Bowel sounds are normal. There is no distension.     Palpations: Abdomen is soft.     Tenderness: There is no abdominal tenderness. There is left CVA tenderness.     Comments: Abdomen is soft and nontender, left CVA tenderness  Skin:    General: Skin is warm and dry.     Coloration: Skin is not jaundiced.  Neurological:  Mental Status: She is alert. Mental status is at baseline.     Coordination: Coordination normal.    ED Results / Procedures / Treatments   Labs (all labs ordered are listed, but only abnormal results are displayed) Labs Reviewed  COMPREHENSIVE METABOLIC PANEL - Abnormal; Notable for the following components:       Result Value   Potassium 3.0 (*)    Glucose, Bld 151 (*)    All other components within normal limits  CBC WITH DIFFERENTIAL/PLATELET    EKG None  Radiology US Renal  Result Date: 12/06/2021 CLINICAL DATA:  Left flank pain EXAM: RENAL / URINARY TRACT ULTRASOUND COMPLETE COMPARISON:  CT 11/16/2021 FINDINGS: Right Kidney: Renal measurements: 11.1 x 5.5 x 5.9 cm = volume: 190 mL. Mild right hydronephrosis. Small nonobstructing stones, the largest 9 mm in the upper pole. Normal echotexture. Left Kidney: Renal measurements: 14.4 x 6.1 x 6.4 cm = volume: 290 mL. Normal echotexture. Moderate left hydronephrosis. Multiple left renal stones measuring up to 2 cm. Bladder: Appears normal for degree of bladder distention. Other: None. IMPRESSION: Bilateral nephrolithiasis. Bilateral hydronephrosis, left greater than right. Electronically Signed   By: Rolm Baptise M.D.   On: 12/06/2021 18:24   CT Renal Stone Study  Result Date: 12/06/2021 CLINICAL DATA:  Flank pain. Kidney stones suspected. Left ureteral stent removed earlier today. EXAM: CT ABDOMEN AND PELVIS WITHOUT CONTRAST TECHNIQUE: Multidetector CT imaging of the abdomen and pelvis was performed following the standard protocol without IV contrast. COMPARISON:  11/16/2021 FINDINGS: Lower chest: Unremarkable. Hepatobiliary: Liver measures 23 cm craniocaudal length, enlarged. No focal abnormality in the liver on this study without intravenous contrast. There is no evidence for gallstones, gallbladder wall thickening, or pericholecystic fluid. No intrahepatic or extrahepatic biliary dilation. Pancreas: No focal mass lesion. No dilatation of the main duct. No intraparenchymal cyst. No peripancreatic edema. Spleen: No splenomegaly. No focal mass lesion. Adrenals/Urinary Tract: No adrenal nodule or mass. Multiple bilateral renal stones are again identified measuring up to 9 mm in the lower pole right kidney and 18 mm in the lower pole left kidney. Mild to  moderate left hydroureteronephrosis evident down to a 3 mm stone in the mid left ureter (axial 58/2 and coronal 44/5. Additional tiny stones are seen in the dependent left renal pelvis (axial images 39 and 40 of series 2). There is peripelvic and proximal left periureteric edema. No right ureteral stone evident. No bladder stone. Stomach/Bowel: Stomach is unremarkable. No gastric wall thickening. No evidence of outlet obstruction. Duodenum is normally positioned as is the ligament of Treitz. No small bowel wall thickening. No small bowel dilatation. The terminal ileum is normal. The appendix is not well visualized, but there is no edema or inflammation in the region of the cecum. No gross colonic mass. No colonic wall thickening. Diverticular changes are noted in the left colon without evidence of diverticulitis. Vascular/Lymphatic: There is mild atherosclerotic calcification of the abdominal aorta without aneurysm. There is no gastrohepatic or hepatoduodenal ligament lymphadenopathy. No retroperitoneal or mesenteric lymphadenopathy. No pelvic sidewall lymphadenopathy. Reproductive: The uterus is unremarkable.  There is no adnexal mass. Other: No intraperitoneal free fluid. Musculoskeletal: No worrisome lytic or sclerotic osseous abnormality. IMPRESSION: 1. Mild to moderate left hydroureteronephrosis down to a 3 mm mid left ureteral stone. Additional tiny stones are seen in the dependent left renal pelvis. 2. Multiple bilateral renal stones. 3. Hepatomegaly. 4. Left colonic diverticulosis without diverticulitis. 5. Aortic Atherosclerosis (ICD10-I70.0). Electronically Signed   By: Verda Cumins.D.  On: 12/06/2021 20:03    Procedures Procedures   Medications Ordered in ED Medications  sodium chloride 0.9 % bolus 1,000 mL (0 mLs Intravenous Stopped 12/06/21 1823)  morphine 4 MG/ML injection 4 mg (4 mg Intravenous Given 12/06/21 1718)  ondansetron (ZOFRAN) injection 4 mg (4 mg Intravenous Given 12/06/21  1718)  HYDROmorphone (DILAUDID) injection 1 mg (1 mg Intravenous Given 12/06/21 1747)  ketorolac (TORADOL) 30 MG/ML injection 30 mg (30 mg Intravenous Given 12/06/21 1913)    ED Course  I have reviewed the triage vital signs and the nursing notes.  Pertinent labs & imaging results that were available during my care of the patient were reviewed by me and considered in my medical decision making (see chart for details).  Clinical Course as of 12/06/21 2313  Tue Dec 06, 2021  1801 Comprehensive metabolic panel(!) Slight hypokalemia, no gross electrolyte derangement or AKI.  Creatinine and GFR within normal limits. [HS]  1801 CBC with Differential No leukocytosis or anemia [HS]    Clinical Course User Index [HS] Sherrill Raring, PA-C   MDM Rules/Calculators/A&P                           Stable vitals, nontoxic-appearing and afebrile.  Left CVA tenderness, will get renal ultrasound.  I suspect this is postop pain, patient is not septic.  No leukocytosis, slight hypokalemia but no gross electrolyte derangement.  Creatinine within normal limits, GFR within normal limits, no AKI.  Patient does have some hydronephrosis bilaterally per the renal study.  Her pain is a 7 out of 10 after Dilaudid and morphine.  I will consult with urology for their recommendations, patient is followed by [facility] urology [place].  UA reviewed from earlier today. As below.   Urine Color Yellow, Straw Amber Abnormal    Urine Clarity Clear Cloudy Abnormal    Urine pH 5.0 - 8.0 6.5   Urine Specific Gravity 1.002 - 1.030 >=1.030   Urine Glucose Negative MG/DL Trace Abnormal    Urine Bilirubin Negative Negative   Urine Ketone Negative MG/DL Negative   Urine Blood/Hb Negative 3+ Abnormal    Urine Protein Negative MG/DL 3+ Abnormal    Urine Urobilinogen 0.2 - 1.0 EU/DL 0.2   Urine Nitrite Negative Negative   Urine Leukocyte Esterase Negative 1+ Abnormal    Urine WBC None seen, 0-5 /HPF 6-12 Abnormal     Urine RBC None seen, 0-2 /HPF TNTC Abnormal    Urine Epithelial Cells 0 - 5 /HPF 1-5     Spoke with urologist DrRaynelle Fanning.  She advises getting a CT renal given the right-sided hydronephrosis, she suspects the hydronephrosis is likely secondary to urethral edema from the stent removal earlier today.  If there is still an obstructing stone on the right side she advises starting patient on Flomax.  Does not advise admission unless patient becomes febrile.  We will proceed with CT renal and try Toradol for pain control.  DrMarland Kitchen Raynelle Fanning is the available to see the patient tomorrow if needed.  I appreciate her consult.  Left nephrolithiasis, 3 mm stone.  Patient started on Flomax, will give outpatient pain medicine and have him follow-up with Dr. Margarita Sermons tomorrow morning.    No signs of a septic stone, patient vitals remains stable and she has not been febrile.  No significant leukocytosis, do not think she needs additional work-up or admission at this time.  Patient discharged in stable condition.  Final Clinical  Impression(s) / ED Diagnoses Final diagnoses:  Nephrolithiasis    Rx / DC Orders ED Discharge Orders          Ordered    tamsulosin (FLOMAX) 0.4 MG CAPS capsule  Daily after supper        12/06/21 2015    HYDROcodone-acetaminophen (NORCO/VICODIN) 5-325 MG tablet  Every 4 hours PRN        12/06/21 2015    ondansetron (ZOFRAN) 4 MG tablet  Every 6 hours        12/06/21 2015             Sherrill Raring, PA-C 12/06/21 2313    Dorie Rank, MD 12/07/21 [phone]

## 2021-12-06 NOTE — ED Notes (Signed)
Pt returned from CT °

## 2021-12-06 NOTE — ED Triage Notes (Signed)
She had a renal stent removed from her left kidney after having tx for a kidney stone a week ago. Here with severe pain.

## 2021-12-06 NOTE — ED Notes (Signed)
Called [facility] for Urology

## 2021-12-06 NOTE — Discharge Instructions (Addendum)
Take Flomax as prescribed.  Also take Norco and Zofran for severe breakthrough pain.  Try to rely on the ibuprofen if possible.  Call urologist tomorrow for follow-up appointment tomorrow afternoon.

## 2021-12-10 ENCOUNTER — Other Ambulatory Visit: Payer: Self-pay | Admitting: Oncology

## 2021-12-10 ENCOUNTER — Other Ambulatory Visit: Payer: Self-pay | Admitting: Family Medicine

## 2021-12-10 DIAGNOSIS — F418 Other specified anxiety disorders: Secondary | ICD-10-CM

## 2021-12-15 NOTE — Telephone Encounter (Signed)
Spoke with insurance and they are sending denial letter so appeal can be done.

## 2021-12-15 NOTE — Telephone Encounter (Signed)
Deniedon December 19 CaseId:[id number];Status:Denied;Review Type:Prior Auth;Appeal Information: Attention:ATTN: [place] [id number]. DPOEU,MP,[id number] QMGQQ:[phone] ZTI:[phone]; Important - Please read the below note on eAppeals: Please reference the denial letter for information on the rights for an appeal, rationale for the denial, and how to submit an appeal including if any information is needed to support the appeal. Note about urgent situations - Generally, an urgent situation is one which, in the opinion of the provider, the health of the patient may be in serious jeopardy or may experience pain that cannot be adequately controlled while waiting for a decision on the appeal.;

## 2021-12-16 NOTE — Telephone Encounter (Signed)
Insurance denied the brand Vasepa 1g and the generic.  Denial reason: "Coverage of the requested medication is provided for cardiovascular risk reduction in patients with elevated triglycerides and hypertriglyceridemia."

## 2021-12-20 NOTE — Telephone Encounter (Signed)
Spoke with patient and she stated that she has had elevated triglycerides in the past and since she has been on the Vascepa it has kept it down. Advised patient that we will send in appeal.    Appeal letter has been faxed.

## 2022-01-09 ENCOUNTER — Other Ambulatory Visit: Payer: Self-pay | Admitting: *Deleted

## 2022-01-09 ENCOUNTER — Other Ambulatory Visit: Payer: Self-pay

## 2022-01-09 DIAGNOSIS — F418 Other specified anxiety disorders: Secondary | ICD-10-CM

## 2022-01-09 DIAGNOSIS — E785 Hyperlipidemia, unspecified: Secondary | ICD-10-CM

## 2022-01-09 MED ORDER — EZETIMIBE 10 MG PO TABS: ORAL_TABLET | ORAL | 1 refills | Status: DC

## 2022-01-09 MED ORDER — MESALAMINE 1.2 G PO TBEC: DELAYED_RELEASE_TABLET | ORAL | 3 refills | Status: DC

## 2022-01-09 MED ORDER — AMLODIPINE BESYLATE 5 MG PO TABS: ORAL_TABLET | ORAL | 0 refills | Status: DC

## 2022-01-09 MED ORDER — FLUOXETINE HCL 40 MG PO CAPS: ORAL_CAPSULE | ORAL | 1 refills | Status: DC

## 2022-01-12 ENCOUNTER — Encounter: Payer: Self-pay | Admitting: *Deleted

## 2022-01-13 ENCOUNTER — Encounter: Payer: Self-pay | Admitting: *Deleted

## 2022-02-14 ENCOUNTER — Other Ambulatory Visit (INDEPENDENT_AMBULATORY_CARE_PROVIDER_SITE_OTHER): Payer: PRIVATE HEALTH INSURANCE

## 2022-02-14 DIAGNOSIS — E1165 Type 2 diabetes mellitus with hyperglycemia: Secondary | ICD-10-CM

## 2022-02-14 DIAGNOSIS — E785 Hyperlipidemia, unspecified: Secondary | ICD-10-CM | POA: Diagnosis not present

## 2022-02-14 LAB — LIPID PANEL
Cholesterol: 219 mg/dL — ABNORMAL HIGH (ref 0–200)
HDL: 54.4 mg/dL (ref >39.00)
LDL Cholesterol: 141 mg/dL — ABNORMAL HIGH (ref 0–99)
NonHDL: 164.99
Total CHOL/HDL Ratio: 4
Triglycerides: 122 mg/dL (ref 0.0–149.0)
VLDL: 24.4 mg/dL (ref 0.0–40.0)

## 2022-02-14 LAB — COMPREHENSIVE METABOLIC PANEL
ALT: 27 U/L (ref 0–35)
AST: 20 U/L (ref 0–37)
Albumin: 4.4 g/dL (ref 3.5–5.2)
Alkaline Phosphatase: 77 U/L (ref 39–117)
BUN: 17 mg/dL (ref 6–23)
CO2: 35 mEq/L — ABNORMAL HIGH (ref 19–32)
Calcium: 9.8 mg/dL (ref 8.4–10.5)
Chloride: 98 mEq/L (ref 96–112)
Creatinine, Ser: 0.7 mg/dL (ref 0.40–1.20)
GFR: 97.39 mL/min (ref >60.00)
Glucose, Bld: 134 mg/dL — ABNORMAL HIGH (ref 70–99)
Potassium: 3.7 mEq/L (ref 3.5–5.1)
Sodium: 138 mEq/L (ref 135–145)
Total Bilirubin: 0.8 mg/dL (ref 0.2–1.2)
Total Protein: 7.1 g/dL (ref 6.0–8.3)

## 2022-02-14 LAB — HEMOGLOBIN A1C: Hgb A1c MFr Bld: 6.4 % (ref 4.6–6.5)

## 2022-02-15 ENCOUNTER — Encounter: Payer: Self-pay | Admitting: Family Medicine

## 2022-02-17 NOTE — Telephone Encounter (Signed)
Spoke with insurance and yes they did deny my second level appeals (2 appeals) for the [place].

## 2022-02-20 ENCOUNTER — Other Ambulatory Visit: Payer: Self-pay

## 2022-02-20 DIAGNOSIS — E785 Hyperlipidemia, unspecified: Secondary | ICD-10-CM

## 2022-02-20 MED ORDER — ROSUVASTATIN CALCIUM 10 MG PO TABS: 10.0000 mg | ORAL_TABLET | Freq: Every day | ORAL | 2 refills | Status: DC

## 2022-02-21 ENCOUNTER — Telehealth: Payer: Self-pay

## 2022-02-21 MED ORDER — WEGOVY 0.25 MG/0.5ML ~~LOC~~ SOAJ: 0.2500 mg | SUBCUTANEOUS | 0 refills | Status: DC

## 2022-02-21 NOTE — Telephone Encounter (Signed)
PA initiated via Covermymeds; KEY: [id number]. PA approved.    [id number];EFEOFH:[id number];Review Type:Prior Auth;Coverage Start Date:01/22/2022;Coverage End Date:09/19/2022;

## 2022-03-15 ENCOUNTER — Telehealth: Payer: Self-pay

## 2022-03-15 NOTE — Telephone Encounter (Signed)
[facility] called needed a new script for a the next dose up for pt's wegovy. Call [phone] if ok ?

## 2022-03-15 NOTE — Telephone Encounter (Signed)
Are you ok with sending in next dose?  Looks like message to follow up in 4 weeks was not relayed to patient.  Can we send in next dose and then have her follow up? ?

## 2022-03-16 ENCOUNTER — Other Ambulatory Visit: Payer: Self-pay | Admitting: Family Medicine

## 2022-03-16 MED ORDER — WEGOVY 0.5 MG/0.5ML ~~LOC~~ SOAJ: 0.5000 mg | SUBCUTANEOUS | 0 refills | Status: DC

## 2022-03-16 NOTE — Telephone Encounter (Signed)
Rx sent. Pt has appt ?

## 2022-03-17 ENCOUNTER — Other Ambulatory Visit: Payer: Self-pay | Admitting: Family Medicine

## 2022-04-05 ENCOUNTER — Other Ambulatory Visit: Payer: Self-pay | Admitting: *Deleted

## 2022-04-05 DIAGNOSIS — E785 Hyperlipidemia, unspecified: Secondary | ICD-10-CM

## 2022-04-05 MED ORDER — ROSUVASTATIN CALCIUM 10 MG PO TABS: 10.0000 mg | ORAL_TABLET | Freq: Every day | ORAL | 1 refills | Status: DC

## 2022-04-11 ENCOUNTER — Other Ambulatory Visit: Payer: Self-pay | Admitting: Family Medicine

## 2022-04-25 ENCOUNTER — Ambulatory Visit (INDEPENDENT_AMBULATORY_CARE_PROVIDER_SITE_OTHER): Payer: PRIVATE HEALTH INSURANCE | Admitting: Family Medicine

## 2022-04-25 ENCOUNTER — Encounter: Payer: Self-pay | Admitting: Family Medicine

## 2022-04-25 VITALS — BP 120/88 | HR 69 | Temp 97.6°F | Resp 18 | Ht 64.0 in | Wt 207.2 lb

## 2022-04-25 DIAGNOSIS — E559 Vitamin D deficiency, unspecified: Secondary | ICD-10-CM

## 2022-04-25 DIAGNOSIS — I10 Essential (primary) hypertension: Secondary | ICD-10-CM

## 2022-04-25 DIAGNOSIS — C50412 Malignant neoplasm of upper-outer quadrant of left female breast: Secondary | ICD-10-CM

## 2022-04-25 DIAGNOSIS — F418 Other specified anxiety disorders: Secondary | ICD-10-CM | POA: Diagnosis not present

## 2022-04-25 DIAGNOSIS — B351 Tinea unguium: Secondary | ICD-10-CM

## 2022-04-25 DIAGNOSIS — E538 Deficiency of other specified B group vitamins: Secondary | ICD-10-CM

## 2022-04-25 DIAGNOSIS — E785 Hyperlipidemia, unspecified: Secondary | ICD-10-CM | POA: Diagnosis not present

## 2022-04-25 DIAGNOSIS — B370 Candidal stomatitis: Secondary | ICD-10-CM

## 2022-04-25 DIAGNOSIS — Z0000 Encounter for general adult medical examination without abnormal findings: Secondary | ICD-10-CM | POA: Diagnosis not present

## 2022-04-25 DIAGNOSIS — Z170 Estrogen receptor positive status [ER+]: Secondary | ICD-10-CM

## 2022-04-25 LAB — CBC WITH DIFFERENTIAL/PLATELET
Basophils Absolute: 0.1 10*3/uL (ref 0.0–0.1)
Basophils Relative: 0.8 % (ref 0.0–3.0)
Eosinophils Absolute: 0.3 10*3/uL (ref 0.0–0.7)
Eosinophils Relative: 4.7 % (ref 0.0–5.0)
HCT: 44.3 % (ref 36.0–46.0)
Hemoglobin: 14.4 g/dL (ref 12.0–15.0)
Lymphocytes Relative: 33.2 % (ref 12.0–46.0)
Lymphs Abs: 2.1 10*3/uL (ref 0.7–4.0)
MCHC: 32.6 g/dL (ref 30.0–36.0)
MCV: 87.7 fl (ref 78.0–100.0)
Monocytes Absolute: 0.5 10*3/uL (ref 0.1–1.0)
Monocytes Relative: 8.1 % (ref 3.0–12.0)
Neutro Abs: 3.4 10*3/uL (ref 1.4–7.7)
Neutrophils Relative %: 53.2 % (ref 43.0–77.0)
Platelets: 283 10*3/uL (ref 150.0–400.0)
RBC: 5.05 Mil/uL (ref 3.87–5.11)
RDW: 13.8 % (ref 11.5–15.5)
WBC: 6.4 10*3/uL (ref 4.0–10.5)

## 2022-04-25 LAB — COMPREHENSIVE METABOLIC PANEL
ALT: 31 U/L (ref 0–35)
AST: 29 U/L (ref 0–37)
Albumin: 4.5 g/dL (ref 3.5–5.2)
Alkaline Phosphatase: 79 U/L (ref 39–117)
BUN: 17 mg/dL (ref 6–23)
CO2: 30 mEq/L (ref 19–32)
Calcium: 9.5 mg/dL (ref 8.4–10.5)
Chloride: 99 mEq/L (ref 96–112)
Creatinine, Ser: 0.69 mg/dL (ref 0.40–1.20)
GFR: 97.59 mL/min (ref >60.00)
Glucose, Bld: 93 mg/dL (ref 70–99)
Potassium: 3.7 mEq/L (ref 3.5–5.1)
Sodium: 136 mEq/L (ref 135–145)
Total Bilirubin: 0.6 mg/dL (ref 0.2–1.2)
Total Protein: 7.2 g/dL (ref 6.0–8.3)

## 2022-04-25 LAB — VITAMIN D 25 HYDROXY (VIT D DEFICIENCY, FRACTURES): VITD: 29.48 ng/mL — ABNORMAL LOW (ref 30.00–100.00)

## 2022-04-25 LAB — LIPID PANEL
Cholesterol: 108 mg/dL (ref 0–200)
HDL: 55.4 mg/dL (ref >39.00)
LDL Cholesterol: 37 mg/dL (ref 0–99)
NonHDL: 52.15
Total CHOL/HDL Ratio: 2
Triglycerides: 75 mg/dL (ref 0.0–149.0)
VLDL: 15 mg/dL (ref 0.0–40.0)

## 2022-04-25 LAB — TSH: TSH: 2.3 u[IU]/mL (ref 0.35–5.50)

## 2022-04-25 LAB — VITAMIN B12: Vitamin B-12: 599 pg/mL (ref 211–911)

## 2022-04-25 MED ORDER — CICLOPIROX 8 % EX SOLN: Freq: Every day | CUTANEOUS | 5 refills | Status: DC

## 2022-04-25 MED ORDER — FLUOXETINE HCL 40 MG PO CAPS: ORAL_CAPSULE | ORAL | 3 refills | Status: DC

## 2022-04-25 MED ORDER — WEGOVY 1 MG/0.5ML ~~LOC~~ SOAJ: 1.0000 mg | SUBCUTANEOUS | 0 refills | Status: DC

## 2022-04-25 MED ORDER — NYSTATIN 100000 UNIT/ML MT SUSP: 5.0000 mL | Freq: Four times a day (QID) | OROMUCOSAL | 0 refills | Status: DC

## 2022-04-25 NOTE — Assessment & Plan Note (Signed)
con't wegovy ?con't diet and exercise ?

## 2022-04-25 NOTE — Progress Notes (Signed)
? ?Subjective:  ? ?By signing my name below, I, Zite Okoli, attest that this documentation has been prepared under the direction and in the presence of Ann Held, DO. 04/25/2022   ? ? Patient ID: Lindsey [name], female    DOB: 11-28-1966, 56 y.o.   MRN: [id number] ? ?Chief Complaint  ?Patient presents with  ? Annual Exam  ?  Pt states fasting   ? ? ?HPI ?Patient is in today for a comprehensive physical exam. ? ?She complains of thrush for the past 6 months. She recently took antibiotics to treat a UTI. ? ?She has a mole on the side of her right abdomen that just appeared and she is worried about. She will set an appointment with her dermatologist. ? ?She complains of fungal infection on her right foot. ? ?She is seeing nephrology for kidney stones. They have been trying different remedies to stop the growth of the stones and it is being effective.  ? ?She is doing well on 5 mg wegovy.  ?Wt Readings from Last 3 Encounters:  ?04/25/22 207 lb 3.2 oz (94 kg)  ?12/06/21 207 lb 0.2 oz (93.9 kg)  ?11/25/21 207 lb (93.9 kg)  ?  ?She denies fever, hearing loss, ear pain,congestion, sinus pain, sore throat, eye pain, chest pain, palpitations, cough, shortness of breath, wheezing, nausea. vomiting, diarrhea, constipation, blood in stool, dysuria,frequency, hematuria and headaches. ? ?She is unsure if she received the shingles vaccine. She has 5 Covid-19 vaccines at this time.  ? ?She had a cytoscopy with lithotripsy in December 2022 and January 2023.  ?No changes in family medical history. ? ?Past Medical History:  ?Diagnosis Date  ? Allergy   ? Anxiety   ? Breast cancer ([place])   ? Cancer [facility])   ? Colitis   ? Crohn's disease ([place])   ? Depression   ? Diverticulosis   ? Hyperlipidemia   ? Hypertension   ? Mild intermittent asthma   ? rarely uses inhaler  ? Nephrolithiasis   ? Personal history of radiation therapy   ? PONV (postoperative nausea and vomiting)   ? Stroke [facility])   ? ? ?Past Surgical History:   ?Procedure Laterality Date  ? BREAST BIOPSY Left 10/17/2019  ? BREAST LUMPECTOMY Left 12/02/2019  ? BREAST LUMPECTOMY WITH RADIOACTIVE SEED LOCALIZATION Left 12/02/2019  ? Procedure: LEFT BREAST LUMPECTOMY WITH RADIOACTIVE SEED LOCALIZATION;  Surgeon: Erroll Luna, MD;  Location: [place];  Service: General;  Laterality: Left;  ? COMBINED HYSTEROSCOPY DIAGNOSTIC / D&C    ? CYSTOSCOPY W/ URETEROSCOPY W/ LITHOTRIPSY    ? dec 2022 and jan 2023,  [facility]  ? IR ANGIO INTRA EXTRACRAN SEL COM CAROTID INNOMINATE BILAT MOD SED  02/10/2020  ? IR ANGIO VERTEBRAL SEL VERTEBRAL BILAT MOD SED  02/10/2020  ? IR US GUIDE VASC ACCESS RIGHT  02/10/2020  ? LEEP    ? LITHOTRIPSY    ? RE-EXCISION OF BREAST LUMPECTOMY Left 12/24/2019  ? Procedure: RE-EXCISION OF LEFT BREAST LUMPECTOMY;  Surgeon: Erroll Luna, MD;  Location: [place];  Service: General;  Laterality: Left;  ? RENAL ARTERY STENT Left   ? WISDOM TOOTH EXTRACTION    ? ? ?Family History  ?Problem Relation Age of Onset  ? Allergic rhinitis Mother   ? Food Allergy Mother   ?     peanut  ? Dementia Father   ? Alzheimer's disease Father   ? Asthma Brother   ? Colon cancer Paternal  Grandmother   ? Breast cancer Cousin   ? Hyperlipidemia Other   ? Hypertension Other   ? Diabetes Other   ? Crohn's disease Neg Hx   ? Esophageal cancer Neg Hx   ? Stomach cancer Neg Hx   ? Rectal cancer Neg Hx   ? Angioedema Neg Hx   ? Eczema Neg Hx   ? Immunodeficiency Neg Hx   ? Urticaria Neg Hx   ? ? ?Social History  ? ?Socioeconomic History  ? Marital status: Married  ?  Spouse name: Not on file  ? Number of children: Not on file  ? Years of education: Not on file  ? Highest education level: Not on file  ?Occupational History  ? Not on file  ?Tobacco Use  ? Smoking status: Former  ?  Packs/day: 1.00  ?  Years: 10.00  ?  Pack years: 10.00  ?  Types: Cigarettes  ?  Quit date: 12/26/1995  ?  Years since quitting: 26.3  ? Smokeless tobacco: Never  ?Vaping Use  ?  Vaping Use: Never used  ?Substance and Sexual Activity  ? Alcohol use: Yes  ?  Comment: occasional wine   ? Drug use: No  ? Sexual activity: Yes  ?Other Topics Concern  ? Not on file  ?Social History Narrative  ? Occupation: Babysit  ? Married  ? Regular exercise- yes  ? ?Social Determinants of Health  ? ?Financial Resource Strain: Not on file  ?Food Insecurity: Not on file  ?Transportation Needs: Not on file  ?Physical Activity: Not on file  ?Stress: Not on file  ?Social Connections: Not on file  ?Intimate Partner Violence: Not on file  ? ? ?Outpatient Medications Prior to Visit  ?Medication Sig Dispense Refill  ? Adalimumab (HUMIRA PEN) 40 MG/0.4ML PNKT INJECT 40 MG (1 PEN) UNDER THE SKIN EVERY 14 DAYS 2 each 11  ? albuterol (VENTOLIN HFA) 108 (90 Base) MCG/ACT inhaler Inhale 1-2 puffs into the lungs every 4 (four) hours as needed for wheezing or shortness of breath. 18 g 3  ? ALPRAZolam (XANAX) 1 MG tablet Take 1-2 tablets thirty minutes prior to MRI.  May take one additional tablet before entering scanner, if needed.  MUST HAVE DRIVER. 3 tablet 0  ? amLODipine (NORVASC) 5 MG tablet TAKE 1 TABLET DAILY 90 tablet 1  ? aspirin EC 81 MG EC tablet Take 1 tablet (81 mg total) by mouth daily.    ? desloratadine (CLARINEX) 5 MG tablet Take 5 mg by mouth daily.   4  ? ezetimibe (ZETIA) 10 MG tablet TAKE ONE (1) TABLET BY MOUTH EACH DAY 90 tablet 1  ? fluticasone (FLONASE) 50 MCG/ACT nasal spray Place 1 spray into both nostrils daily.    ? fluticasone (FLOVENT HFA) 110 MCG/ACT inhaler Inhale 2 puffs into the lungs 2 (two) times daily. 1 each 0  ? HYDROcodone-acetaminophen (NORCO/VICODIN) 5-325 MG tablet Take 2 tablets by mouth every 4 (four) hours as needed. 6 tablet 0  ? icosapent Ethyl (VASCEPA) 1 g capsule Take 2 capsules (2 g total) by mouth 2 (two) times daily. 120 capsule 5  ? indapamide (LOZOL) 1.25 MG tablet Take 1.25 mg by mouth daily.   9  ? indomethacin (INDOCIN) 50 MG capsule Take 1 capsule (50 mg total) by  mouth as directed. Use 30 mins prior to sex 30 capsule 0  ? levocetirizine (XYZAL) 5 MG tablet Take 1 tablet (5 mg total) by mouth every evening. 30 tablet 0  ?  mesalamine (LIALDA) 1.2 g EC tablet TAKE TWO TABLETS BY MOUTH TWICE DAILY 360 tablet 3  ? montelukast (SINGULAIR) 10 MG tablet Take 1 tablet (10 mg total) by mouth at bedtime. 30 tablet 0  ? ondansetron (ZOFRAN) 4 MG tablet Take 1 tablet (4 mg total) by mouth every 6 (six) hours. 12 tablet 0  ? OVER THE COUNTER MEDICATION Take 7 drops by mouth 2 (two) times daily. CBD oil    ? POTASSIUM CITRATE PO Take 108 mg by mouth 2 (two) times daily.    ? rosuvastatin (CRESTOR) 10 MG tablet Take 1 tablet (10 mg total) by mouth daily. 90 tablet 1  ? tamsulosin (FLOMAX) 0.4 MG CAPS capsule Take 1 capsule (0.4 mg total) by mouth daily after supper. 30 capsule 0  ? vitamin B-12 (CYANOCOBALAMIN) 1000 MCG tablet Take 1,000 mcg by mouth daily.    ? FLUoxetine (PROZAC) 40 MG capsule TAKE ONE (1) CAPSULE EACH DAY BY MOUTH 90 capsule 1  ? WEGOVY 0.5 MG/0.5ML SOAJ INJECT 0.5 MILLIGRAMS INTO THE SKIN ONCEWEEKLY 2 mL 0  ? COVID-19 mRNA bivalent vaccine, Moderna, (MODERNA COVID-19 BIVAL BOOSTER) 50 MCG/0.5ML injection Inject into the muscle. (Patient not taking: Reported on 04/25/2022) 0.5 mL 0  ? ?No facility-administered medications prior to visit.  ? ? ?Allergies  ?Allergen Reactions  ? Azithromycin Hives  ? Nitrofurantoin Other (See Comments)  ?  abd pain  ? Tamiflu [Oseltamivir Phosphate] Hives  ? ? ?Review of Systems  ?Constitutional:  Negative for fever.  ?HENT:  Negative for congestion, ear pain, hearing loss, sinus pain and sore throat.   ?Eyes:  Negative for blurred vision and pain.  ?Respiratory:  Negative for cough, sputum production, shortness of breath and wheezing.   ?Cardiovascular:  Negative for chest pain and palpitations.  ?Gastrointestinal:  Negative for blood in stool, constipation, diarrhea, nausea and vomiting.  ?Genitourinary:  Negative for dysuria, frequency,  hematuria and urgency.  ?Musculoskeletal:  Negative for back pain, falls and myalgias.  ?Neurological:  Negative for dizziness, sensory change, loss of consciousness, weakness and headaches.  ?Endo/Heme/Allergies:

## 2022-04-25 NOTE — Patient Instructions (Signed)

## 2022-04-25 NOTE — Assessment & Plan Note (Signed)
ghm utd Check labs  See avs  

## 2022-04-25 NOTE — Assessment & Plan Note (Signed)
Per oncology ?

## 2022-04-25 NOTE — Assessment & Plan Note (Signed)
Well controlled, no changes to meds. Encouraged heart healthy diet such as the DASH diet and exercise as tolerated.  °

## 2022-04-26 ENCOUNTER — Other Ambulatory Visit: Payer: Self-pay

## 2022-04-26 MED ORDER — VITAMIN D (ERGOCALCIFEROL) 1.25 MG (50000 UNIT) PO CAPS: 50000.0000 [IU] | ORAL_CAPSULE | ORAL | 1 refills | Status: DC

## 2022-05-10 ENCOUNTER — Other Ambulatory Visit: Payer: Self-pay | Admitting: Family Medicine

## 2022-05-10 MED ORDER — WEGOVY 1.7 MG/0.75ML ~~LOC~~ SOAJ: 1.7000 mg | SUBCUTANEOUS | 0 refills | Status: DC

## 2022-06-07 ENCOUNTER — Other Ambulatory Visit: Payer: Self-pay | Admitting: *Deleted

## 2022-06-07 NOTE — Telephone Encounter (Signed)
[facility] requesting refill for vit d 50000.  12 caps w/ 3rf  Last vit d was done on 04/25/22 and was 29.48.

## 2022-06-08 MED ORDER — VITAMIN D (ERGOCALCIFEROL) 1.25 MG (50000 UNIT) PO CAPS: 50000.0000 [IU] | ORAL_CAPSULE | ORAL | 1 refills | Status: DC

## 2022-06-14 ENCOUNTER — Encounter: Payer: Self-pay | Admitting: Family Medicine

## 2022-06-16 MED ORDER — FLUOXETINE HCL 20 MG PO CAPS: 20.0000 mg | ORAL_CAPSULE | Freq: Every day | ORAL | 1 refills | Status: DC

## 2022-06-20 ENCOUNTER — Telehealth: Payer: Self-pay | Admitting: Family Medicine

## 2022-06-20 MED ORDER — VITAMIN D (ERGOCALCIFEROL) 1.25 MG (50000 UNIT) PO CAPS: 50000.0000 [IU] | ORAL_CAPSULE | ORAL | 1 refills | Status: DC

## 2022-06-20 NOTE — Telephone Encounter (Signed)
Rx sent 

## 2022-07-07 ENCOUNTER — Other Ambulatory Visit: Payer: Self-pay | Admitting: Family Medicine

## 2022-07-07 ENCOUNTER — Encounter: Payer: Self-pay | Admitting: Family Medicine

## 2022-07-07 DIAGNOSIS — E785 Hyperlipidemia, unspecified: Secondary | ICD-10-CM

## 2022-07-10 MED ORDER — WEGOVY 2.4 MG/0.75ML ~~LOC~~ SOAJ: 2.4000 mg | SUBCUTANEOUS | 3 refills | Status: DC

## 2022-08-10 ENCOUNTER — Other Ambulatory Visit: Payer: Self-pay | Admitting: Nurse Practitioner

## 2022-08-10 ENCOUNTER — Other Ambulatory Visit: Payer: Self-pay | Admitting: Adult Health

## 2022-08-10 DIAGNOSIS — Z1231 Encounter for screening mammogram for malignant neoplasm of breast: Secondary | ICD-10-CM

## 2022-09-05 ENCOUNTER — Ambulatory Visit: Payer: PRIVATE HEALTH INSURANCE

## 2022-09-11 ENCOUNTER — Ambulatory Visit
Admission: RE | Admit: 2022-09-11 | Discharge: 2022-09-11 | Disposition: A | Discharge status: Home / Self Care | Payer: PRIVATE HEALTH INSURANCE | Source: Ambulatory Visit | Attending: Nurse Practitioner | Admitting: Nurse Practitioner

## 2022-09-11 ENCOUNTER — Other Ambulatory Visit: Payer: Self-pay | Admitting: Family Medicine

## 2022-09-11 DIAGNOSIS — E785 Hyperlipidemia, unspecified: Secondary | ICD-10-CM

## 2022-09-11 DIAGNOSIS — Z1231 Encounter for screening mammogram for malignant neoplasm of breast: Secondary | ICD-10-CM

## 2022-09-13 ENCOUNTER — Other Ambulatory Visit: Payer: Self-pay | Admitting: Family Medicine

## 2022-09-15 ENCOUNTER — Inpatient Hospital Stay: Payer: PRIVATE HEALTH INSURANCE | Admitting: Hematology and Oncology

## 2022-09-18 ENCOUNTER — Ambulatory Visit (INDEPENDENT_AMBULATORY_CARE_PROVIDER_SITE_OTHER): Payer: PRIVATE HEALTH INSURANCE | Admitting: Family Medicine

## 2022-09-18 ENCOUNTER — Encounter: Payer: Self-pay | Admitting: Family Medicine

## 2022-09-18 VITALS — BP 110/80 | HR 98 | Temp 97.9°F | Resp 18 | Ht 64.0 in | Wt 203.6 lb

## 2022-09-18 DIAGNOSIS — I10 Essential (primary) hypertension: Secondary | ICD-10-CM | POA: Diagnosis not present

## 2022-09-18 DIAGNOSIS — Z23 Encounter for immunization: Secondary | ICD-10-CM | POA: Diagnosis not present

## 2022-09-18 DIAGNOSIS — E559 Vitamin D deficiency, unspecified: Secondary | ICD-10-CM

## 2022-09-18 DIAGNOSIS — E785 Hyperlipidemia, unspecified: Secondary | ICD-10-CM | POA: Diagnosis not present

## 2022-09-18 DIAGNOSIS — E538 Deficiency of other specified B group vitamins: Secondary | ICD-10-CM | POA: Diagnosis not present

## 2022-09-18 NOTE — Assessment & Plan Note (Signed)
Tolerating statin, encouraged heart healthy diet, avoid trans fats, minimize simple carbs and saturated fats. Increase exercise as tolerated 

## 2022-09-18 NOTE — Progress Notes (Signed)
Subjective:   By signing my name below, I, Carylon Perches, attest that this documentation has been prepared under the direction and in the presence of Ann Held DO 09/18/2022      Patient ID: Lindsey [name], female    DOB: [birth year], 56 y.o.   MRN: [id number]  Chief Complaint  Patient presents with   Hypertension   Hyperlipidemia   Follow-up    HPI Patient is in today for office visit.   Her blood pressure is in the normal range during this visit.  BP Readings from Last 3 Encounters:  09/18/22 110/80  04/25/22 120/88  12/06/21 (!) 117/53    Pulse Readings from Last 3 Encounters:  09/18/22 98  04/25/22 69  12/06/21 78    She is responding well to her 40 Mg/0.4 mL Humira medication  She is interested in receiving the influenza vaccine during this visit.   Past Medical History:  Diagnosis Date   Allergy    Anxiety    Breast cancer ([place])    Cancer ([place])    Colitis    Crohn's disease ([place])    Depression    Diverticulosis    Hyperlipidemia    Hypertension    Mild intermittent asthma    rarely uses inhaler   Nephrolithiasis    Personal history of radiation therapy    PONV (postoperative nausea and vomiting)    Stroke [facility])     Past Surgical History:  Procedure Laterality Date   BREAST BIOPSY Left 10/17/2019   BREAST LUMPECTOMY Left 12/02/2019   BREAST LUMPECTOMY WITH RADIOACTIVE SEED LOCALIZATION Left 12/02/2019   Procedure: LEFT BREAST LUMPECTOMY WITH RADIOACTIVE SEED LOCALIZATION;  Surgeon: Erroll Luna, MD;  Location: [place];  Service: General;  Laterality: Left;   COMBINED HYSTEROSCOPY DIAGNOSTIC / D&C     CYSTOSCOPY W/ URETEROSCOPY W/ LITHOTRIPSY     dec 2022 and jan 2023,  [facility]   IR ANGIO INTRA EXTRACRAN SEL COM CAROTID INNOMINATE BILAT MOD SED  02/10/2020   IR ANGIO VERTEBRAL SEL VERTEBRAL BILAT MOD SED  02/10/2020   IR US GUIDE VASC ACCESS RIGHT  02/10/2020   LEEP     LITHOTRIPSY     RE-EXCISION OF BREAST  LUMPECTOMY Left 12/24/2019   Procedure: RE-EXCISION OF LEFT BREAST LUMPECTOMY;  Surgeon: Erroll Luna, MD;  Location: [place];  Service: General;  Laterality: Left;   RENAL ARTERY STENT Left    WISDOM TOOTH EXTRACTION      Family History  Problem Relation Age of Onset   Allergic rhinitis Mother    Food Allergy Mother        peanut   Dementia Father    Alzheimer's disease Father    Asthma Brother    Colon cancer Paternal Grandmother    Breast cancer Cousin    Hyperlipidemia Other    Hypertension Other    Diabetes Other    Crohn's disease Neg Hx    Esophageal cancer Neg Hx    Stomach cancer Neg Hx    Rectal cancer Neg Hx    Angioedema Neg Hx    Eczema Neg Hx    Immunodeficiency Neg Hx    Urticaria Neg Hx     Social History   Socioeconomic History   Marital status: Married    Spouse name: Not on file   Number of children: Not on file   Years of education: Not on file   Highest education level: Not on file  Occupational  History   Not on file  Tobacco Use   Smoking status: Former    Packs/day: 1.00    Years: 10.00    Total pack years: 10.00    Types: Cigarettes    Quit date: 12/26/1995    Years since quitting: 26.7   Smokeless tobacco: Never  Vaping Use   Vaping Use: Never used  Substance and Sexual Activity   Alcohol use: Yes    Comment: occasional wine    Drug use: No   Sexual activity: Yes  Other Topics Concern   Not on file  Social History Narrative   Occupation: Babysit   Married   Regular exercise- yes   Social Determinants of Radio broadcast assistant Strain: Not on file  Food Insecurity: Not on file  Transportation Needs: Not on file  Physical Activity: Not on file  Stress: Not on file  Social Connections: Not on file  Intimate Partner Violence: Not on file    Outpatient Medications Prior to Visit  Medication Sig Dispense Refill   Adalimumab (HUMIRA PEN) 40 MG/0.4ML PNKT INJECT 40 MG (1 PEN) UNDER THE SKIN EVERY 14  DAYS 2 each 11   albuterol (VENTOLIN HFA) 108 (90 Base) MCG/ACT inhaler Inhale 1-2 puffs into the lungs every 4 (four) hours as needed for wheezing or shortness of breath. 18 g 3   amLODipine (NORVASC) 5 MG tablet TAKE 1 TABLET DAILY 90 tablet 1   aspirin EC 81 MG EC tablet Take 1 tablet (81 mg total) by mouth daily.     ciclopirox (PENLAC) 8 % solution Apply topically at bedtime. Apply over nail and surrounding skin. Apply daily over previous coat. After seven (7) days, may remove with alcohol and continue cycle. 6.6 mL 5   desloratadine (CLARINEX) 5 MG tablet Take 5 mg by mouth daily.   4   ezetimibe (ZETIA) 10 MG tablet TAKE 1 TABLET DAILY 90 tablet 3   FLUoxetine (PROZAC) 20 MG capsule Take 1 capsule (20 mg total) by mouth daily. 90 capsule 1   fluticasone (FLONASE) 50 MCG/ACT nasal spray Place 1 spray into both nostrils daily.     fluticasone (FLOVENT HFA) 110 MCG/ACT inhaler Inhale 2 puffs into the lungs 2 (two) times daily. 1 each 0   HYDROcodone-acetaminophen (NORCO/VICODIN) 5-325 MG tablet Take 2 tablets by mouth every 4 (four) hours as needed. 6 tablet 0   icosapent Ethyl (VASCEPA) 1 g capsule Take 2 capsules (2 g total) by mouth 2 (two) times daily. 120 capsule 5   indapamide (LOZOL) 1.25 MG tablet Take 1.25 mg by mouth daily.   9   levocetirizine (XYZAL) 5 MG tablet Take 1 tablet (5 mg total) by mouth every evening. 30 tablet 0   mesalamine (LIALDA) 1.2 g EC tablet TAKE TWO TABLETS BY MOUTH TWICE DAILY 360 tablet 3   montelukast (SINGULAIR) 10 MG tablet Take 1 tablet (10 mg total) by mouth at bedtime. 30 tablet 0   nystatin (MYCOSTATIN) 100000 UNIT/ML suspension Take 5 mLs (500,000 Units total) by mouth 4 (four) times daily. Swish and spit 60 mL 0   OVER THE COUNTER MEDICATION Take 7 drops by mouth 2 (two) times daily. CBD oil     POTASSIUM CITRATE PO Take 108 mg by mouth 2 (two) times daily.     rosuvastatin (CRESTOR) 10 MG tablet TAKE 1 TABLET DAILY 90 tablet 3   Semaglutide-Weight  Management (WEGOVY) 2.4 MG/0.75ML SOAJ Inject 2.4 mg into the skin once a week. 3 mL  3   vitamin B-12 (CYANOCOBALAMIN) 1000 MCG tablet Take 1,000 mcg by mouth daily.     Vitamin D, Ergocalciferol, (DRISDOL) 1.25 MG (50000 UNIT) CAPS capsule Take 1 capsule (50,000 Units total) by mouth every 7 (seven) days. 12 capsule 1   ALPRAZolam (XANAX) 1 MG tablet Take 1-2 tablets thirty minutes prior to MRI.  May take one additional tablet before entering scanner, if needed.  MUST HAVE DRIVER. (Patient not taking: Reported on 09/18/2022) 3 tablet 0   indomethacin (INDOCIN) 50 MG capsule Take 1 capsule (50 mg total) by mouth as directed. Use 30 mins prior to sex (Patient not taking: Reported on 09/18/2022) 30 capsule 0   ondansetron (ZOFRAN) 4 MG tablet Take 1 tablet (4 mg total) by mouth every 6 (six) hours. (Patient not taking: Reported on 09/18/2022) 12 tablet 0   tamsulosin (FLOMAX) 0.4 MG CAPS capsule Take 1 capsule (0.4 mg total) by mouth daily after supper. (Patient not taking: Reported on 09/18/2022) 30 capsule 0   No facility-administered medications prior to visit.    Allergies  Allergen Reactions   Azithromycin Hives   Nitrofurantoin Other (See Comments)    abd pain   Tamiflu [Oseltamivir Phosphate] Hives    Review of Systems  Constitutional:  Negative for fever and malaise/fatigue.  HENT:  Negative for congestion.   Eyes:  Negative for blurred vision.  Respiratory:  Negative for shortness of breath.   Cardiovascular:  Negative for chest pain, palpitations and leg swelling.  Gastrointestinal:  Negative for abdominal pain, blood in stool and nausea.  Genitourinary:  Negative for dysuria and frequency.  Musculoskeletal:  Negative for falls.  Skin:  Negative for rash.  Neurological:  Negative for dizziness, loss of consciousness and headaches.  Endo/Heme/Allergies:  Negative for environmental allergies.  Psychiatric/Behavioral:  Negative for depression. The patient is not nervous/anxious.         Objective:    Physical Exam Vitals and nursing note reviewed.  Constitutional:      General: She is not in acute distress.    Appearance: Normal appearance. She is not ill-appearing.  HENT:     Head: Normocephalic and atraumatic.     Right Ear: External ear normal.     Left Ear: External ear normal.  Eyes:     Extraocular Movements: Extraocular movements intact.     Pupils: Pupils are equal, round, and reactive to light.  Cardiovascular:     Rate and Rhythm: Normal rate and regular rhythm.     Heart sounds: Normal heart sounds. No murmur heard.    No gallop.  Pulmonary:     Effort: Pulmonary effort is normal. No respiratory distress.     Breath sounds: Normal breath sounds. No wheezing or rales.  Skin:    General: Skin is warm and dry.  Neurological:     Mental Status: She is alert and oriented to person, place, and time.  Psychiatric:        Judgment: Judgment normal.     BP 110/80 (BP Location: Left Arm, Patient Position: Sitting, Cuff Size: Large)   Pulse 98   Temp 97.9 F (36.6 C) (Oral)   Resp 18   Ht 5' 4"  (1.626 m)   Wt 203 lb 9.6 oz (92.4 kg)   SpO2 96%   BMI 34.95 kg/m  Wt Readings from Last 3 Encounters:  09/18/22 203 lb 9.6 oz (92.4 kg)  04/25/22 207 lb 3.2 oz (94 kg)  12/06/21 207 lb 0.2 oz (93.9 kg)  Diabetic Foot Exam - Simple   No data filed    Lab Results  Component Value Date   WBC 6.4 04/25/2022   HGB 14.4 04/25/2022   HCT 44.3 04/25/2022   PLT 283.0 04/25/2022   GLUCOSE 93 04/25/2022   CHOL 108 04/25/2022   TRIG 75.0 04/25/2022   HDL 55.40 04/25/2022   LDLDIRECT 123.2 12/20/2011   LDLCALC 37 04/25/2022   ALT 31 04/25/2022   AST 29 04/25/2022   NA 136 04/25/2022   K 3.7 04/25/2022   CL 99 04/25/2022   CREATININE 0.69 04/25/2022   BUN 17 04/25/2022   CO2 30 04/25/2022   TSH 2.30 04/25/2022   INR 1.0 02/05/2020   HGBA1C 6.4 02/14/2022   MICROALBUR 26.3 11/08/2021    Lab Results  Component Value Date   TSH 2.30  04/25/2022   Lab Results  Component Value Date   WBC 6.4 04/25/2022   HGB 14.4 04/25/2022   HCT 44.3 04/25/2022   MCV 87.7 04/25/2022   PLT 283.0 04/25/2022   Lab Results  Component Value Date   NA 136 04/25/2022   K 3.7 04/25/2022   CO2 30 04/25/2022   GLUCOSE 93 04/25/2022   BUN 17 04/25/2022   CREATININE 0.69 04/25/2022   BILITOT 0.6 04/25/2022   ALKPHOS 79 04/25/2022   AST 29 04/25/2022   ALT 31 04/25/2022   PROT 7.2 04/25/2022   ALBUMIN 4.5 04/25/2022   CALCIUM 9.5 04/25/2022   ANIONGAP 12 12/06/2021   GFR 97.59 04/25/2022   Lab Results  Component Value Date   CHOL 108 04/25/2022   Lab Results  Component Value Date   HDL 55.40 04/25/2022   Lab Results  Component Value Date   LDLCALC 37 04/25/2022   Lab Results  Component Value Date   TRIG 75.0 04/25/2022   Lab Results  Component Value Date   CHOLHDL 2 04/25/2022   Lab Results  Component Value Date   HGBA1C 6.4 02/14/2022       Assessment & Plan:   Problem List Items Addressed This Visit       Unprioritized   B12 deficiency   Relevant Orders   Vitamin B12   VITAMIN D 25 Hydroxy (Vit-D Deficiency, Fractures)   Primary hypertension    Well controlled, no changes to meds. Encouraged heart healthy diet such as the DASH diet and exercise as tolerated.       Relevant Orders   CBC with Differential/Platelet   Comprehensive metabolic panel   Lipid panel   Hyperlipidemia - Primary    Tolerating statin, encouraged heart healthy diet, avoid trans fats, minimize simple carbs and saturated fats. Increase exercise as tolerated      Relevant Orders   CBC with Differential/Platelet   Comprehensive metabolic panel   Lipid panel   Other Visit Diagnoses     Need for influenza vaccination       Relevant Orders   Flu Vaccine QUAD 28moIM (Fluarix, Fluzone & Alfiuria Quad PF) (Completed)   Vitamin D deficiency       Relevant Orders   Vitamin B12   VITAMIN D 25 Hydroxy (Vit-D Deficiency, Fractures)         No orders of the defined types were placed in this encounter.   I,Ann Held DO, personally preformed the services described in this documentation.  All medical record entries made by the scribe were at my direction and in my presence.  I have reviewed the chart and discharge instructions (  if applicable) and agree that the record reflects my personal performance and is accurate and complete. 09/18/2022.   I,Amber Collins,acting as a Education administrator for [employer], DO.,have documented all relevant documentation on the behalf of Ann Held, DO,as directed by  Ann Held, DO while in the presence of Ann Held, DO.    Ann Held, DO

## 2022-09-18 NOTE — Assessment & Plan Note (Signed)
Well controlled, no changes to meds. Encouraged heart healthy diet such as the DASH diet and exercise as tolerated.  °

## 2022-09-19 ENCOUNTER — Telehealth: Payer: Self-pay | Admitting: Family Medicine

## 2022-09-19 LAB — CBC WITH DIFFERENTIAL/PLATELET
Basophils Absolute: 0.1 10*3/uL (ref 0.0–0.1)
Basophils Relative: 0.7 % (ref 0.0–3.0)
Eosinophils Absolute: 0.2 10*3/uL (ref 0.0–0.7)
Eosinophils Relative: 2.3 % (ref 0.0–5.0)
HCT: 42.3 % (ref 36.0–46.0)
Hemoglobin: 14.1 g/dL (ref 12.0–15.0)
Lymphocytes Relative: 30.3 % (ref 12.0–46.0)
Lymphs Abs: 2.6 10*3/uL (ref 0.7–4.0)
MCHC: 33.3 g/dL (ref 30.0–36.0)
MCV: 87.7 fl (ref 78.0–100.0)
Monocytes Absolute: 0.6 10*3/uL (ref 0.1–1.0)
Monocytes Relative: 7 % (ref 3.0–12.0)
Neutro Abs: 5.1 10*3/uL (ref 1.4–7.7)
Neutrophils Relative %: 59.7 % (ref 43.0–77.0)
Platelets: 251 10*3/uL (ref 150.0–400.0)
RBC: 4.82 Mil/uL (ref 3.87–5.11)
RDW: 13.7 % (ref 11.5–15.5)
WBC: 8.5 10*3/uL (ref 4.0–10.5)

## 2022-09-19 LAB — LIPID PANEL
Cholesterol: 108 mg/dL (ref 0–200)
HDL: 56.7 mg/dL (ref >39.00)
LDL Cholesterol: 30 mg/dL (ref 0–99)
NonHDL: 51.51
Total CHOL/HDL Ratio: 2
Triglycerides: 107 mg/dL (ref 0.0–149.0)
VLDL: 21.4 mg/dL (ref 0.0–40.0)

## 2022-09-19 LAB — COMPREHENSIVE METABOLIC PANEL
ALT: 31 U/L (ref 0–35)
AST: 27 U/L (ref 0–37)
Albumin: 4.4 g/dL (ref 3.5–5.2)
Alkaline Phosphatase: 77 U/L (ref 39–117)
BUN: 18 mg/dL (ref 6–23)
CO2: 28 mEq/L (ref 19–32)
Calcium: 9.7 mg/dL (ref 8.4–10.5)
Chloride: 99 mEq/L (ref 96–112)
Creatinine, Ser: 0.81 mg/dL (ref 0.40–1.20)
GFR: 81.4 mL/min (ref >60.00)
Glucose, Bld: 152 mg/dL — ABNORMAL HIGH (ref 70–99)
Potassium: 3.3 mEq/L — ABNORMAL LOW (ref 3.5–5.1)
Sodium: 138 mEq/L (ref 135–145)
Total Bilirubin: 0.7 mg/dL (ref 0.2–1.2)
Total Protein: 7.4 g/dL (ref 6.0–8.3)

## 2022-09-19 LAB — VITAMIN D 25 HYDROXY (VIT D DEFICIENCY, FRACTURES): VITD: 62.9 ng/mL (ref 30.00–100.00)

## 2022-09-19 LAB — VITAMIN B12: Vitamin B-12: 961 pg/mL — ABNORMAL HIGH (ref 211–911)

## 2022-09-19 NOTE — Telephone Encounter (Signed)
Medication:  Semaglutide-Weight Management (WEGOVY) 2.4 MG/0.75ML SOAJ   Has the patient contacted their pharmacy? Yes.   PATIENT REQUESTING A 90 DAY SUPPLY WITH 3 REFILLS  Preferred Pharmacy (with phone number or street name):  [place], [place]  [address], [place] [address]  Phone:  [phone]  Fax:  [phone]

## 2022-09-20 MED ORDER — WEGOVY 2.4 MG/0.75ML ~~LOC~~ SOAJ: 2.4000 mg | SUBCUTANEOUS | 3 refills | Status: DC

## 2022-09-20 NOTE — Telephone Encounter (Signed)
Refill sent.

## 2022-09-21 ENCOUNTER — Other Ambulatory Visit: Payer: Self-pay | Admitting: Family Medicine

## 2022-09-21 DIAGNOSIS — E785 Hyperlipidemia, unspecified: Secondary | ICD-10-CM

## 2022-09-21 DIAGNOSIS — E1165 Type 2 diabetes mellitus with hyperglycemia: Secondary | ICD-10-CM

## 2022-09-21 DIAGNOSIS — E1169 Type 2 diabetes mellitus with other specified complication: Secondary | ICD-10-CM

## 2022-09-21 DIAGNOSIS — I10 Essential (primary) hypertension: Secondary | ICD-10-CM

## 2022-09-26 ENCOUNTER — Other Ambulatory Visit: Payer: Self-pay

## 2022-09-26 ENCOUNTER — Inpatient Hospital Stay: Payer: Commercial Managed Care - PPO | Attending: Hematology and Oncology | Admitting: Hematology and Oncology

## 2022-09-26 ENCOUNTER — Encounter: Payer: Self-pay | Admitting: Hematology and Oncology

## 2022-09-26 VITALS — BP 143/76 | HR 78 | Temp 97.7°F | Resp 16 | Ht 64.0 in | Wt 202.0 lb

## 2022-09-26 DIAGNOSIS — Z800 Family history of malignant neoplasm of digestive organs: Secondary | ICD-10-CM | POA: Insufficient documentation

## 2022-09-26 DIAGNOSIS — D0512 Intraductal carcinoma in situ of left breast: Secondary | ICD-10-CM | POA: Diagnosis present

## 2022-09-26 DIAGNOSIS — Z923 Personal history of irradiation: Secondary | ICD-10-CM | POA: Diagnosis not present

## 2022-09-26 DIAGNOSIS — Z09 Encounter for follow-up examination after completed treatment for conditions other than malignant neoplasm: Secondary | ICD-10-CM | POA: Diagnosis not present

## 2022-09-26 DIAGNOSIS — Z86000 Personal history of in-situ neoplasm of breast: Secondary | ICD-10-CM | POA: Insufficient documentation

## 2022-09-26 DIAGNOSIS — Z170 Estrogen receptor positive status [ER+]: Secondary | ICD-10-CM

## 2022-09-26 DIAGNOSIS — Z803 Family history of malignant neoplasm of breast: Secondary | ICD-10-CM | POA: Insufficient documentation

## 2022-09-26 DIAGNOSIS — Z87891 Personal history of nicotine dependence: Secondary | ICD-10-CM | POA: Diagnosis not present

## 2022-09-26 DIAGNOSIS — C50412 Malignant neoplasm of upper-outer quadrant of left female breast: Secondary | ICD-10-CM

## 2022-09-26 NOTE — Progress Notes (Addendum)
Lindsey [name]  [phone] [phone]     ID: Lindsey [name] DOB: 26-Aug-1966  MR#: [id number]  TKP#:[id number]  Patient Care Team: Carollee Herter, Alferd Apa, DO as PCP - General Maisie Fus, MD (Inactive) as Consulting Physician (Obstetrics and Gynecology) Bobbitt, Sedalia Muta, MD as Consulting Physician (Allergy and Immunology) Erroll Luna, MD as Consulting Physician (General Surgery) Magrinat, Virgie Dad, MD (Inactive) as Consulting Physician (Oncology) Pyrtle, Lajuan Lines, MD as Consulting Physician (Gastroenterology) Pamala Hurry, MD as Consulting Physician (Urology) Mauro Kaufmann, RN as Oncology Nurse Navigator Rockwell Germany, RN as Oncology Nurse Navigator Eppie Gibson, MD as Attending Physician (Radiation Oncology) Garvin Fila, MD as Consulting Physician (Neurology) Benay Pike, MD OTHER MD:  CHIEF COMPLAINT: noninvasive breast cancer, estrogen receptor positive  CURRENT TREATMENT:  Considering anastrozole   INTERVAL HISTORY:  Lindsey [name] was scheduled today for follow up of her noninvasive breast cancer.   She is out on any antiestrogen therapy.  She had a lot of social stressors ongoing at that time hence refused antiestrogen therapy and continues on monitoring.  She most recently had a mammogram which was unremarkable according to the patient.  She denies any breast changes.  Rest of the pertinent 10 point ROS reviewed and negative   COVID 19 VACCINATION STATUS: Moderna x2; infection 03/2021   HISTORY OF CURRENT ILLNESS: From the original intake note:  Lindsey [name] had routine screening mammography on 10/08/2017 showing a possible abnormality in the left breast. She underwent left diagnostic mammography with tomography and left breast ultrasonography at [facility] on 10/17/2017 showing: breast density category B; 9 mm indeterminate left breast mass without sonographic correlate. She was scheduled to  undergo biopsy of the left breast mass on 10/22/2017, but the mass was not clearly seen on stereotactic imaging. Left diagnostic mammogram was performed instead and showed the mass as smaller and less conspicuous. Short term follow up was recommended.   She underwent left diagnostic mammogram and left breast ultrasonography on 01/23/2018 and bilateral diagnostic mammogram on 10/11/2018. When she returned for follow up bilateral diagnostic mammogram on 10/15/2019, this showed: breast density category B; new 6 mm group of calcifications within the upper-outer left breast; interval increase in size of lobular mass within the 12 o'clock position. Left breast ultrasonography performed that day measured the 12 o'clock left breast mass at 8 mm and showed no left axillary adenopathy.  Accordingly on 10/17/2019 she proceeded to biopsy of the left breast areas in question. 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   Pathology from the left 12 o'clock mass was negative for malignancy, showing fibrocystic changes with apocrine metaplasia. However, the biopsy clip was found to be located posterior to the mammographically identified mass, so she proceeded to repeat biopsy of the area on 10/30/2019. Pathology [id number]) was again benign, showing fibrocystic changes and fat necrosis.  She opted to proceed with left breast lumpectomy on 12/02/2019 under Dr. Brantley Stage. Pathology from the procedure ([id number]) showed: intermediate grade ductal carcinoma in situ, 0.6 cm; cauterized carcinoma in situ focally present at the lateral margin. Prognostic indicators significant for: estrogen receptor 90% positive with weak staining intensity, and progesterone receptor 100% positive  with strong staining intensity.  The patient's subsequent history is as detailed below.   PAST  MEDICAL HISTORY: Past Medical History:  Diagnosis Date   Allergy    Anxiety    Breast cancer ([place])    Cancer ([place])    Colitis    Crohn's disease ([place])    Depression    Diverticulosis    Hyperlipidemia    Hypertension    Mild intermittent asthma    rarely uses inhaler   Nephrolithiasis    Personal history of radiation therapy    PONV (postoperative nausea and vomiting)    Stroke ([place])     PAST SURGICAL HISTORY: Past Surgical History:  Procedure Laterality Date   BREAST BIOPSY Left 10/17/2019   BREAST LUMPECTOMY Left 12/02/2019   BREAST LUMPECTOMY WITH RADIOACTIVE SEED LOCALIZATION Left 12/02/2019   Procedure: LEFT BREAST LUMPECTOMY WITH RADIOACTIVE SEED LOCALIZATION;  Surgeon: Erroll Luna, MD;  Location: [facility];  Service: General;  Laterality: Left;   COMBINED HYSTEROSCOPY DIAGNOSTIC / D&C     CYSTOSCOPY W/ URETEROSCOPY W/ LITHOTRIPSY     dec 2022 and jan 2023,  [facility]   IR ANGIO INTRA EXTRACRAN SEL COM CAROTID INNOMINATE BILAT MOD SED  02/10/2020   IR ANGIO VERTEBRAL SEL VERTEBRAL BILAT MOD SED  02/10/2020   IR US GUIDE VASC ACCESS RIGHT  02/10/2020   LEEP     LITHOTRIPSY     RE-EXCISION OF BREAST LUMPECTOMY Left 12/24/2019   Procedure: RE-EXCISION OF LEFT BREAST LUMPECTOMY;  Surgeon: Erroll Luna, MD;  Location: [facility];  Service: General;  Laterality: Left;   RENAL ARTERY STENT Left    WISDOM TOOTH EXTRACTION      FAMILY HISTORY: Family History  Problem Relation Age of Onset   Allergic rhinitis Mother    Food Allergy Mother        peanut   Dementia Father    Alzheimer's disease Father    Asthma Brother    Colon cancer Paternal Grandmother    Breast cancer Cousin    Hyperlipidemia Other    Hypertension Other    Diabetes Other    Crohn's disease Neg Hx    Esophageal cancer Neg Hx    Stomach cancer Neg Hx    Rectal  cancer Neg Hx    Angioedema Neg Hx    Eczema Neg Hx    Immunodeficiency Neg Hx    Urticaria Neg Hx   The patient's father died from Alzheimer's disease at age 30.  The patient's mother is 87 years old as of December 2020.  The patient denies a family hx of ovarian cancer. She reports breast cancer in a maternal cousin at age 68 (recurrent stage IV in her late 32s) and in a maternal great aunt. She also notes uterine cancer in her maternal grandmother, bladder cancer in an uncle, and colon cancer in her paternal grandmother.  The patient has 2 sisters, no brothers   GYNECOLOGIC HISTORY:  No LMP recorded. (Menstrual status: Perimenopausal). Menarche: 56 years old Age at first live birth: 56 years old GX P 3 LMP current, irregular, about every 2 months Contraceptive: Barrier methods HRT no Hysterectomy? no BSO?  No   SOCIAL HISTORY: (updated 11/2019)  Lindsey [name] is currently working at the [employer].  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  Her husband Lindsey [name] works at [employer].  The patient has 3 children, Corey 36 teaches physical education in [place], Legrand Como 17 and Sunya Crumb 15 are at home.  Lindsey [name] has a child Erasmo Downer from an  earlier marriage.  She is 26     ADVANCED DIRECTIVES: In the absence of any documents to the contrary the patient's husband is her healthcare power of attorney   HEALTH MAINTENANCE: Social History   Tobacco Use   Smoking status: Former    Packs/day: 1.00    Years: 10.00    Total pack years: 10.00    Types: Cigarettes    Quit date: 12/26/1995    Years since quitting: 26.7   Smokeless tobacco: Never  Vaping Use   Vaping Use: Never used  Substance Use Topics   Alcohol use: Yes    Comment: occasional wine    Drug use: No     Colonoscopy: 12/2018, repeat in 5 years  PAP: Per Dr. Nori Riis  Bone density: Remote   Allergies  Allergen Reactions   Azithromycin Hives   Nitrofurantoin Other (See  Comments)    abd pain   Tamiflu [Oseltamivir Phosphate] Hives    Current Outpatient Medications  Medication Sig Dispense Refill   Adalimumab (HUMIRA PEN) 40 MG/0.4ML PNKT INJECT 40 MG (1 PEN) UNDER THE SKIN EVERY 14 DAYS 2 each 11   albuterol (VENTOLIN HFA) 108 (90 Base) MCG/ACT inhaler Inhale 1-2 puffs into the lungs every 4 (four) hours as needed for wheezing or shortness of breath. 18 g 3   amLODipine (NORVASC) 5 MG tablet TAKE 1 TABLET DAILY 90 tablet 1   aspirin EC 81 MG EC tablet Take 1 tablet (81 mg total) by mouth daily.     ciclopirox (PENLAC) 8 % solution Apply topically at bedtime. Apply over nail and surrounding skin. Apply daily over previous coat. After seven (7) days, may remove with alcohol and continue cycle. 6.6 mL 5   desloratadine (CLARINEX) 5 MG tablet Take 5 mg by mouth daily.   4   ezetimibe (ZETIA) 10 MG tablet TAKE 1 TABLET DAILY 90 tablet 3   FLUoxetine (PROZAC) 20 MG capsule Take 1 capsule (20 mg total) by mouth daily. 90 capsule 1   fluticasone (FLONASE) 50 MCG/ACT nasal spray Place 1 spray into both nostrils daily.     fluticasone (FLOVENT HFA) 110 MCG/ACT inhaler Inhale 2 puffs into the lungs 2 (two) times daily. 1 each 0   HYDROcodone-acetaminophen (NORCO/VICODIN) 5-325 MG tablet Take 2 tablets by mouth every 4 (four) hours as needed. 6 tablet 0   icosapent Ethyl (VASCEPA) 1 g capsule Take 2 capsules (2 g total) by mouth 2 (two) times daily. 120 capsule 5   indapamide (LOZOL) 1.25 MG tablet Take 1.25 mg by mouth daily.   9   levocetirizine (XYZAL) 5 MG tablet Take 1 tablet (5 mg total) by mouth every evening. 30 tablet 0   mesalamine (LIALDA) 1.2 g EC tablet TAKE TWO TABLETS BY MOUTH TWICE DAILY 360 tablet 3   montelukast (SINGULAIR) 10 MG tablet Take 1 tablet (10 mg total) by mouth at bedtime. 30 tablet 0   nystatin (MYCOSTATIN) 100000 UNIT/ML suspension Take 5 mLs (500,000 Units total) by mouth 4 (four) times daily. Swish and spit 60 mL 0   OVER THE COUNTER  MEDICATION Take 7 drops by mouth 2 (two) times daily. CBD oil     POTASSIUM CITRATE PO Take 108 mg by mouth 2 (two) times daily.     rosuvastatin (CRESTOR) 10 MG tablet TAKE 1 TABLET DAILY 90 tablet 3   Semaglutide-Weight Management (WEGOVY) 2.4 MG/0.75ML SOAJ Inject 2.4 mg into the skin once a week. 3 mL 3   vitamin B-12 (CYANOCOBALAMIN)  1000 MCG tablet Take 1,000 mcg by mouth daily.     Vitamin D, Ergocalciferol, (DRISDOL) 1.25 MG (50000 UNIT) CAPS capsule Take 1 capsule (50,000 Units total) by mouth every 7 (seven) days. 12 capsule 1   No current facility-administered medications for this visit.    OBJECTIVE: White woman who appears well  Vitals:   09/26/22 1337  BP: (!) 143/76  Pulse: 78  Resp: 16  Temp: 97.7 F (36.5 C)  SpO2: 98%     Body mass index is 34.67 kg/m.   Wt Readings from Last 3 Encounters:  09/26/22 202 lb (91.6 kg)  09/18/22 203 lb 9.6 oz (92.4 kg)  04/25/22 207 lb 3.2 oz (94 kg)      ECOG FS:1 - Symptomatic but completely ambulatory   Physical Exam Constitutional:      Appearance: Normal appearance.  Chest:     Comments: Bilateral breasts inspected.  No palpable masses or regional adenopathy. Musculoskeletal:        General: No swelling.     Cervical back: Normal range of motion and neck supple. No rigidity.  Lymphadenopathy:     Cervical: No cervical adenopathy.  Neurological:     Mental Status: She is alert.      LAB RESULTS:  CMP     Component Value Date/Time   NA 138 09/18/2022 1541   K 3.3 (L) 09/18/2022 1541   CL 99 09/18/2022 1541   CO2 28 09/18/2022 1541   GLUCOSE 152 (H) 09/18/2022 1541   GLUCOSE 94 12/31/2006 1033   BUN 18 09/18/2022 1541   CREATININE 0.81 09/18/2022 1541   CREATININE 0.73 09/06/2021 0909   CREATININE 0.61 10/22/2020 1006   CALCIUM 9.7 09/18/2022 1541   PROT 7.4 09/18/2022 1541   ALBUMIN 4.4 09/18/2022 1541   AST 27 09/18/2022 1541   AST 22 09/06/2021 0909   ALT 31 09/18/2022 1541   ALT 23 09/06/2021  0909   ALKPHOS 77 09/18/2022 1541   BILITOT 0.7 09/18/2022 1541   BILITOT 0.7 09/06/2021 0909   GFRNONAA >60 12/06/2021 1720   GFRNONAA >60 09/06/2021 0909   GFRAA >60 02/05/2020 0917    No results found for: "TOTALPROTELP", "ALBUMINELP", "A1GS", "A2GS", "BETS", "BETA2SER", "GAMS", "MSPIKE", "SPEI"  Lab Results  Component Value Date   WBC 8.5 09/18/2022   NEUTROABS 5.1 09/18/2022   HGB 14.1 09/18/2022   HCT 42.3 09/18/2022   MCV 87.7 09/18/2022   PLT 251.0 09/18/2022    No results found for: "LABCA2"  No components found for: "VWUJWJ191"  No results for input(s): "INR" in the last 168 hours.  No results found for: "LABCA2"  No results found for: "YNW295"  No results found for: "CAN125"  No results found for: "CAN153"  No results found for: "CA2729"  No components found for: "HGQUANT"  No results found for: "CEA1", "CEA" / No results found for: "CEA1", "CEA"   No results found for: "AFPTUMOR"  No results found for: "CHROMOGRNA"  No results found for: "KPAFRELGTCHN", "LAMBDASER", "KAPLAMBRATIO" (kappa/lambda light chains)  No results found for: "HGBA", "HGBA2QUANT", "HGBFQUANT", "HGBSQUAN" (Hemoglobinopathy evaluation)   No results found for: "LDH"  Lab Results  Component Value Date   IRON 101 09/06/2010   IRONPCTSAT 18.3 (L) 09/06/2010   (Iron and TIBC)  No results found for: "FERRITIN"  Urinalysis    Component Value Date/Time   COLORURINE YELLOW 02/01/2020 1247   APPEARANCEUR CLEAR 02/01/2020 1247   LABSPEC 1.010 02/01/2020 1247   PHURINE 7.5 02/01/2020 1247  GLUCOSEU NEGATIVE 02/01/2020 [place] 05/08/2012 0811   HGBUR SMALL (A) 02/01/2020 1247   HGBUR large 09/19/2010 1245   BILIRUBINUR NEGATIVE 02/01/2020 1247   BILIRUBINUR neg 10/24/2016 0851   KETONESUR NEGATIVE 02/01/2020 1247   PROTEINUR NEGATIVE 02/01/2020 1247   UROBILINOGEN 0.2 10/24/2016 0851   UROBILINOGEN 0.2 01/05/2015 1143   NITRITE NEGATIVE 02/01/2020 1247    LEUKOCYTESUR NEGATIVE 02/01/2020 1247    STUDIES: MM 3D SCREEN BREAST BILATERAL  Result Date: 09/13/2022 CLINICAL DATA:  Screening. History of left lumpectomy in 2020. EXAM: DIGITAL SCREENING BILATERAL MAMMOGRAM WITH TOMOSYNTHESIS AND CAD TECHNIQUE: Bilateral screening digital craniocaudal and mediolateral oblique mammograms were obtained. Bilateral screening digital breast tomosynthesis was performed. The images were evaluated with computer-aided detection. COMPARISON:  Previous exam(s). ACR Breast Density Category b: There are scattered areas of fibroglandular density. FINDINGS: There are no findings suspicious for malignancy. IMPRESSION: No mammographic evidence of malignancy. A result letter of this screening mammogram will be mailed directly to the patient. RECOMMENDATION: Screening mammogram in one year. (Code:SM-B-01Y) BI-RADS CATEGORY  1: Negative. Electronically Signed   By: Zerita Boers M.D.   On: 09/13/2022 09:18      ELIGIBLE FOR AVAILABLE RESEARCH PROTOCOL: no  ASSESSMENT: 56 y.o. [place], [place] woman status post left lumpectomy 12/02/2019 for ductal carcinoma in situ, grade 2, measuring 0.6 cm, with a positive lateral margin  (a) successful additional surgery for margin clearance 12/24/2019  (1) adjuvant radiation 01/21/2020 - 02/18/2020  (2) anastrozole prescribed March 2021, never started by patient  (3) genetics testing 11/28/2019 at [facility] through the Common Hereditary Cancers Panel test by [facility] showed no deleterious mutations in APC, ATM, AXIN2, BARD1, BMPR1A, BRCA1, BRCA2, BRIP1, CDH1, CDK4, CDKN2A, CHEK2, CTNNA1, DICER1, EPCAM, GREM1, HOXB13, KIT, MEN1, MLH1, MSH2, MSH3, MSH6, MUTYH, NBN, NF1, NTHL1, PALB2, PDGFRA, PMS2, POLD1, POLE, PTEN, RAD50, RAD51C, RAD51D, SDHA, SDHB, SDHC, SDHD, SMAD4, SMARCA4, STK11, TP53, TSC1, TSC2, VHL.   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  Since she has not started antiestrogen therapy and since it has almost been 3 years from her diagnosis, I do not believe there is a good reason to start her on antiestrogen therapy at this time.  She is very comfortable with the idea of surveillance.  Since her last visit, she denies any breast changes.  Mammogram from September 2023 unremarkable.  Physical examination today also without any significant findings. She will continue surveillance with annual follow-up.  Total time spent: 30 minutes.  Benay Pike, MD   09/26/2022 1:42 PM Medical Oncology and Hematology [facility] [place], [place] [address] Tel. [phone]    Fax. [phone]   *Total Encounter Time as defined by the Centers for Medicare and Medicaid Services includes, in addition to the face-to-face time of a patient visit (documented in the note above) non-face-to-face time: obtaining and reviewing outside history, ordering and reviewing medications, tests or procedures, care coordination (communications with other health care professionals or caregivers) and documentation in the medical record.

## 2022-09-29 ENCOUNTER — Other Ambulatory Visit: Payer: Self-pay | Admitting: *Deleted

## 2022-09-29 ENCOUNTER — Encounter: Payer: Self-pay | Admitting: Family Medicine

## 2022-09-29 NOTE — Telephone Encounter (Signed)
Error

## 2022-10-01 LAB — CBC AND DIFFERENTIAL: WBC: 5.6

## 2022-10-02 MED ORDER — WEGOVY 2.4 MG/0.75ML ~~LOC~~ SOAJ: 2.4000 mg | SUBCUTANEOUS | 1 refills | Status: DC

## 2022-10-03 ENCOUNTER — Telehealth: Payer: Self-pay | Admitting: *Deleted

## 2022-10-03 NOTE — Telephone Encounter (Signed)
Case ID: [id number]  Approved from 09/24/22-10/24/23.

## 2022-10-11 LAB — CBC: RBC: 5.14 — AB (ref 3.87–5.11)

## 2022-10-11 LAB — LIPID PANEL
Cholesterol: 109 (ref 0–200)
HDL: 55 (ref 35–70)
LDL Cholesterol: 38
Triglycerides: 76 (ref 40–160)

## 2022-10-11 LAB — CBC AND DIFFERENTIAL
HCT: 45 (ref 36–46)
Hemoglobin: 15.2 (ref 12.0–16.0)
Platelets: 257 10*3/uL (ref 150–400)

## 2022-10-11 LAB — COMPREHENSIVE METABOLIC PANEL
Albumin: 4.3 (ref 3.5–5.0)
Calcium: 9.4 (ref 8.7–10.7)
Globulin: 2.6
eGFR: 102

## 2022-10-11 LAB — HEPATIC FUNCTION PANEL
ALT: 37 U/L — AB (ref 7–35)
AST: 32 (ref 13–35)
Alkaline Phosphatase: 75 (ref 25–125)
Bilirubin, Total: 0.7

## 2022-10-11 LAB — HEMOGLOBIN A1C: Hemoglobin A1C: 6.2

## 2022-10-11 LAB — BASIC METABOLIC PANEL
BUN: 16 (ref 4–21)
CO2: 27 — AB (ref 13–22)
Chloride: 102 (ref 99–108)
Creatinine: 0.7 (ref 0.5–1.1)
Glucose: 110
Potassium: 3.7 mEq/L (ref 3.5–5.1)
Sodium: 140 (ref 137–147)

## 2022-10-11 LAB — TSH: TSH: 2.31 (ref 0.41–5.90)

## 2022-10-27 ENCOUNTER — Encounter: Payer: Self-pay | Admitting: Family Medicine

## 2022-10-27 NOTE — Progress Notes (Signed)
Labs from [facility] lab abstracted.  Hard copy given to PCP's CMA, Heather.

## 2022-11-13 ENCOUNTER — Other Ambulatory Visit: Payer: Self-pay | Admitting: Family Medicine

## 2022-11-24 ENCOUNTER — Ambulatory Visit: Payer: Commercial Managed Care - PPO | Admitting: Internal Medicine

## 2022-11-24 ENCOUNTER — Encounter: Payer: Self-pay | Admitting: Internal Medicine

## 2022-11-24 ENCOUNTER — Other Ambulatory Visit: Payer: Commercial Managed Care - PPO

## 2022-11-24 VITALS — BP 122/80 | HR 91 | Ht 64.0 in | Wt 188.1 lb

## 2022-11-24 DIAGNOSIS — K5010 Crohn's disease of large intestine without complications: Secondary | ICD-10-CM

## 2022-11-24 MED ORDER — HUMIRA (2 PEN) 40 MG/0.4ML ~~LOC~~ AJKT: AUTO-INJECTOR | SUBCUTANEOUS | 1 refills | Status: DC

## 2022-11-24 MED ORDER — MESALAMINE 1.2 G PO TBEC: DELAYED_RELEASE_TABLET | ORAL | 1 refills | Status: DC

## 2022-11-24 NOTE — Progress Notes (Signed)
   Subjective:    Patient ID: Lindsey [name], female    DOB: Oct 25, 1966, 56 y.o.   MRN: [id number]  HPI Lindsey [name] is a 56 year old female with Crohn's colitis diagnosed in 2017 maintained on Humira every 2 weeks and mesalamine 4.8 g daily, breast cancer in remission, hypertension, hyperlipidemia, diabetes, obesity, kidney stones who is here for follow-up.  She is here alone today was last seen in the office by Alonza Bogus, PA-C 1 year ago.  She reports that her colitis seems to be a bit more active predominantly with looser more frequent stool.  Stools are not explosive.  She is not having blood or mucus in her stool.  She has minor lower abdominal cramping.  Bowel movements is much as 3 times per day, previously normal for her will be 1 formed stool daily.  She gets hives with contact but this is a longstanding issue for her and she takes over-the-counter allergy meds.  She has been adherent to her Lialda 4.8 g daily and Humira 40 mg every 2 weeks.  She started [facility] back in April and has titrated up and now slightly down her dose.  She is currently on 1.7 mg dose.  She is lost 20 pounds.  On initiation of this med she had some mild nausea and early fullness but this has abated.  Her appetite is decreased and she does not think about food as much.  She is also using a program to help work on reduction in her A1c.  Recent lab work done outside revealed elevated high-sensitivity CRP 3   Review of Systems As per HPI, otherwise negative  Current Medications, Allergies, Past Medical History, Past Surgical History, Family History and Social History were reviewed in [employer] record.    Objective:   Physical Exam BP 122/80   Pulse 91   Ht 5' 4"  (1.626 m)   Wt 188 lb 2 oz (85.3 kg)   BMI 32.29 kg/m  Gen: awake, alert, NAD HEENT: anicteric CV: RRR, no mrg Pulm: CTA b/l Abd: soft, NT/ND, +BS throughout Ext: no c/c/e Neuro: nonfocal  Labs reviewed,  normal CBC, elevated CRP      Assessment & Plan:  56 year old female with Crohn's colitis diagnosed in 2017 maintained on Humira every 2 weeks and mesalamine 4.8 g daily, breast cancer in remission, hypertension, hyperlipidemia, diabetes, obesity, kidney stones who is here for follow-up.  Crohn's colitis (diagnosis 2017) --I am suspicious for mild activity of her Crohn's disease despite Humira 40 mg every 2 weeks and Lialda 4.8 g daily.  We discussed this today and we have multiple other treatment options which may work better for her.  We will try to objectively assess disease activity and change therapy accordingly: -- Check fecal calprotectin; if elevated I would recommend we consider changing to Skyrizi and stopping Humira; if Orson Ape is started and effective we may be able to titrate down Lialda -- If fecal calprotectin is normal, check Humira level and antibody at trough -- For now continue Humira 40 mg every 14 days and Lialda 4.8 g daily  2.  Breast cancer --in remission and following with oncology  Annual follow-up but possibly sooner if therapy is changed, see #1  30 minutes total spent today including patient facing time, coordination of care, reviewing medical history/procedures/pertinent radiology studies, and documentation of the encounter.

## 2022-11-24 NOTE — Patient Instructions (Addendum)
We have sent the following medications to your pharmacy for you to pick up at your convenience:  [place] provider has requested that you go to the basement level for lab work before leaving today. Press "B" on the elevator. The lab is located at the first door on the left as you exit the elevator.   If you are age 56 or older, your body mass index should be between 23-30. Your Body mass index is 32.29 kg/m. If this is out of the aforementioned range listed, please consider follow up with your Primary Care Provider.  If you are age 75 or younger, your body mass index should be between 19-25. Your Body mass index is 32.29 kg/m. If this is out of the aformentioned range listed, please consider follow up with your Primary Care Provider.   ________________________________________________________  The [facility] providers would like to encourage you to use [facility] to communicate with providers for non-urgent requests or questions.  Due to long hold times on the telephone, sending your provider a message by [facility] may be a faster and more efficient way to get a response.  Please allow 48 business hours for a response.  Please remember that this is for non-urgent requests.   Thank you for entrusting me with your care and choosing [facility].  Dr Hilarie Fredrickson

## 2022-11-26 LAB — QUANTIFERON-TB GOLD PLUS
Mitogen-NIL: >10 IU/mL
NIL: 0.02 IU/mL
QuantiFERON-TB Gold Plus: NEGATIVE
TB1-NIL: 0.01 IU/mL
TB2-NIL: 0.01 IU/mL

## 2022-11-27 ENCOUNTER — Other Ambulatory Visit: Payer: Commercial Managed Care - PPO

## 2022-11-27 ENCOUNTER — Other Ambulatory Visit: Payer: Self-pay | Admitting: Family Medicine

## 2022-11-27 DIAGNOSIS — K5010 Crohn's disease of large intestine without complications: Secondary | ICD-10-CM

## 2022-11-30 LAB — CALPROTECTIN, FECAL: Calprotectin, Fecal: 28 ug/g (ref 0–120)

## 2022-12-01 ENCOUNTER — Other Ambulatory Visit (HOSPITAL_COMMUNITY): Payer: Self-pay

## 2022-12-01 ENCOUNTER — Other Ambulatory Visit: Payer: Self-pay

## 2022-12-01 DIAGNOSIS — K5010 Crohn's disease of large intestine without complications: Secondary | ICD-10-CM

## 2022-12-04 ENCOUNTER — Other Ambulatory Visit: Payer: Commercial Managed Care - PPO

## 2022-12-04 DIAGNOSIS — K5010 Crohn's disease of large intestine without complications: Secondary | ICD-10-CM

## 2022-12-07 ENCOUNTER — Encounter: Payer: Self-pay | Admitting: Internal Medicine

## 2022-12-09 LAB — ADALIMUMAB+AB (SERIAL MONITOR)
Adalimumab Drug Level: 8.4 ug/mL
Anti-Adalimumab Antibody: <25 ng/mL

## 2022-12-09 LAB — SERIAL MONITORING

## 2022-12-11 ENCOUNTER — Other Ambulatory Visit: Payer: Self-pay

## 2022-12-11 DIAGNOSIS — K5010 Crohn's disease of large intestine without complications: Secondary | ICD-10-CM

## 2022-12-11 MED ORDER — NA SULFATE-K SULFATE-MG SULF 17.5-3.13-1.6 GM/177ML PO SOLN: ORAL | 0 refills | Status: DC

## 2023-01-01 ENCOUNTER — Ambulatory Visit (AMBULATORY_SURGERY_CENTER): Payer: Commercial Managed Care - PPO | Admitting: Internal Medicine

## 2023-01-01 ENCOUNTER — Encounter: Payer: Self-pay | Admitting: Internal Medicine

## 2023-01-01 VITALS — BP 124/62 | HR 65 | Temp 97.8°F | Resp 19 | Ht 64.0 in | Wt 188.0 lb

## 2023-01-01 DIAGNOSIS — K6289 Other specified diseases of anus and rectum: Secondary | ICD-10-CM | POA: Diagnosis not present

## 2023-01-01 DIAGNOSIS — Z0389 Encounter for observation for other suspected diseases and conditions ruled out: Secondary | ICD-10-CM

## 2023-01-01 DIAGNOSIS — K5730 Diverticulosis of large intestine without perforation or abscess without bleeding: Secondary | ICD-10-CM | POA: Diagnosis not present

## 2023-01-01 DIAGNOSIS — K5010 Crohn's disease of large intestine without complications: Secondary | ICD-10-CM | POA: Diagnosis present

## 2023-01-01 MED ORDER — SODIUM CHLORIDE 0.9 % IV SOLN: 500.0000 mL | Freq: Once | INTRAVENOUS | Status: DC

## 2023-01-01 MED ORDER — MESALAMINE 1.2 G PO TBEC: 2.4000 g | DELAYED_RELEASE_TABLET | Freq: Every day | ORAL | 1 refills | Status: DC

## 2023-01-01 NOTE — Progress Notes (Signed)
Pt's states no medical or surgical changes since previsit or office visit. 

## 2023-01-01 NOTE — Patient Instructions (Signed)
Resume previous diet. - Continue present medications. Decrease Lialda to 2.4 g daily. - Await pathology results. If biopsies consistent with remission then course of rifaximin 550 mg TID x 14 days for overlapping IBS-D. - Repeat colonoscopy is recommended for surveillance. The colonoscopy date will be determined after pathology results from today's exam become available for review.  YOU HAD AN ENDOSCOPIC PROCEDURE TODAY: Refer to the procedure report and other information in the discharge instructions given to you for any specific questions about what was found during the examination. If this information does not answer your questions, please call [place] office at [phone] to clarify.   YOU SHOULD EXPECT: Some feelings of bloating in the abdomen. Passage of more gas than usual. Walking can help get rid of the air that was put into your GI tract during the procedure and reduce the bloating. If you had a lower endoscopy (such as a colonoscopy or flexible sigmoidoscopy) you may notice spotting of blood in your stool or on the toilet paper. Some abdominal soreness may be present for a day or two, also.  DIET: Your first meal following the procedure should be a light meal and then it is ok to progress to your normal diet. A half-sandwich or bowl of soup is an example of a good first meal. Heavy or fried foods are harder to digest and may make you feel nauseous or bloated. Drink plenty of fluids but you should avoid alcoholic beverages for 24 hours. If you had a esophageal dilation, please see attached instructions for diet.    ACTIVITY: Your care partner should take you home directly after the procedure. You should plan to take it easy, moving slowly for the rest of the day. You can resume normal activity the day after the procedure however YOU SHOULD NOT DRIVE, use power tools, machinery or perform tasks that involve climbing or major physical exertion for 24 hours (because of the sedation medicines  used during the test).   SYMPTOMS TO REPORT IMMEDIATELY: A gastroenterologist can be reached at any hour. Please call [phone]  for any of the following symptoms:  Following lower endoscopy (colonoscopy, flexible sigmoidoscopy) Excessive amounts of blood in the stool  Significant tenderness, worsening of abdominal pains  Swelling of the abdomen that is new, acute  Fever of 100 or higher   FOLLOW UP:  If any biopsies were taken you will be contacted by phone or by letter within the next 1-3 weeks. Call [phone]  if you have not heard about the biopsies in 3 weeks.  Please also call with any specific questions about appointments or follow up tests.

## 2023-01-01 NOTE — Progress Notes (Signed)
Called to room to assist during endoscopic procedure.  Patient ID and intended procedure confirmed with present staff. Received instructions for my participation in the procedure from the performing physician.  

## 2023-01-01 NOTE — Progress Notes (Signed)
Vss nad trans to pacu °

## 2023-01-01 NOTE — Op Note (Signed)
[facility] Patient Name: Lindsey [name] Procedure Date: 01/01/2023 9:05 AM MRN: [id number] Endoscopist: Jerene Bears , MD, [id number] Age: 57 Referring MD:  Date of Birth: Apr 19, 1966 Gender: Female Account #: [id number] Procedure:                Colonoscopy Indications:              Disease activity assessment of Crohn's disease of                            the colon; dx 2017 (increase in freq and looser                            stools despite therapy; fecal calprotectin normal,                            Humira level ~8 without antibodies, also on Lialda                            4.8 g daily) Medicines:                Monitored Anesthesia Care Procedure:                Pre-Anesthesia Assessment:                           - Prior to the procedure, a History and Physical                            was performed, and patient medications and                            allergies were reviewed. The patient's tolerance of                            previous anesthesia was also reviewed. The risks                            and benefits of the procedure and the sedation                            options and risks were discussed with the patient.                            All questions were answered, and informed consent                            was obtained. Prior Anticoagulants: The patient has                            taken no anticoagulant or antiplatelet agents. ASA                            Grade Assessment: II - A patient with mild systemic  disease. After reviewing the risks and benefits,                            the patient was deemed in satisfactory condition to                            undergo the procedure.                           After obtaining informed consent, the colonoscope                            was passed under direct vision. Throughout the                            procedure, the patient's blood pressure, pulse,  and                            oxygen saturations were monitored continuously. The                            CF [id number] #[id number] was introduced through the anus                            and advanced to the terminal ileum. The colonoscopy                            was performed without difficulty. The patient                            tolerated the procedure well. The quality of the                            bowel preparation was good. The terminal ileum,                            ileocecal valve, appendiceal orifice, and rectum                            were photographed. Scope In: 9:11:15 AM Scope Out: 9:26:55 AM Scope Withdrawal Time: 0 hours 10 minutes 47 seconds  Total Procedure Duration: 0 hours 15 minutes 40 seconds  Findings:                 The digital rectal exam was normal.                           The terminal ileum appeared normal.                           The Simple Endoscopic Score for Crohn's Disease was                            determined based on the endoscopic appearance of  the mucosa in the following segments:                           - Ileum: Findings include no ulcers present, no                            ulcerated surfaces, no affected surfaces and no                            narrowings. Segment score: 0.                           - Right Colon: Findings include no ulcers present,                            no ulcerated surfaces, no affected surfaces and no                            narrowings. Segment score: 0.                           - Transverse Colon: Findings include no ulcers                            present, no ulcerated surfaces, no affected                            surfaces and no narrowings. Segment score: 0.                           - Left Colon: Findings include no ulcers present,                            no ulcerated surfaces, no affected surfaces and no                            narrowings. Segment  score: 0.                           - Rectum: Findings include no ulcers present, no                            ulcerated surfaces, no affected surfaces and no                            narrowings. Segment score: 0.                           - Total SES-CD aggregate score: 0. Multiple                            biopsies were obtained in the entire colon with  cold forceps for histology.                           Multiple medium-mouthed and small-mouthed                            diverticula were found in the sigmoid colon and                            descending colon.                           Anal papilla(e) were hypertrophied retroflexed                            views in the rectum. Complications:            No immediate complications. Estimated Blood Loss:     Estimated blood loss was minimal. Impression:               - The examined portion of the ileum was normal.                           - Simple Endoscopic Score for Crohn's Disease: 0,                            mucosal inflammatory changes secondary to Crohn's                            disease, in remission.                           - Mild diverticulosis in the sigmoid colon and in                            the descending colon.                           - Anal papilla(e) were hypertrophied.                           - Multiple biopsies were obtained in the entire                            colon (cecum/acending; transverse;                            descending/sigmoid; rectum). Recommendation:           - Patient has a contact number available for                            emergencies. The signs and symptoms of potential                            delayed complications were discussed with the  patient. Return to normal activities tomorrow.                            Written discharge instructions were provided to the                            patient.                            - Resume previous diet.                           - Continue present medications. Decrease Lialda to                            2.4 g daily.                           - Await pathology results. If biopsies consistent                            with remission then course of rifaximin 550 mg TID                            x 14 days for overlapping IBS-D.                           - Repeat colonoscopy is recommended for                            surveillance. The colonoscopy date will be                            determined after pathology results from today's                            exam become available for review. Jerene Bears, MD 01/01/2023 9:40:03 AM This report has been signed electronically.

## 2023-01-01 NOTE — Progress Notes (Signed)
GASTROENTEROLOGY PROCEDURE H&P NOTE   Primary Care Physician: Ann Held, DO    Reason for Procedure:  Crohn's colitis with loose stools and diarrhea  Plan:    Colonoscopy  Patient is appropriate for endoscopic procedure(s) in the ambulatory ([place]) setting.  The nature of the procedure, as well as the risks, benefits, and alternatives were carefully and thoroughly reviewed with the patient. Ample time for discussion and questions allowed. The patient understood, was satisfied, and agreed to proceed.     HPI: Lindsey [name] is a 57 y.o. female who presents for colonoscopy.  Medical history as below.  Tolerated the prep.  No recent chest pain or shortness of breath.  No abdominal pain today.  Past Medical History:  Diagnosis Date   Allergy    Anxiety    Breast cancer ([place])    Cancer ([place])    Colitis    Crohn's disease ([place])    Depression    Diverticulosis    Hyperlipidemia    Hypertension    Mild intermittent asthma    rarely uses inhaler   Nephrolithiasis    Personal history of radiation therapy    PONV (postoperative nausea and vomiting)    Stroke [facility])     Past Surgical History:  Procedure Laterality Date   BREAST BIOPSY Left 10/17/2019   BREAST LUMPECTOMY Left 12/02/2019   BREAST LUMPECTOMY WITH RADIOACTIVE SEED LOCALIZATION Left 12/02/2019   Procedure: LEFT BREAST LUMPECTOMY WITH RADIOACTIVE SEED LOCALIZATION;  Surgeon: Erroll Luna, MD;  Location: [facility];  Service: General;  Laterality: Left;   COMBINED HYSTEROSCOPY DIAGNOSTIC / D&C     CYSTOSCOPY W/ URETEROSCOPY W/ LITHOTRIPSY     dec 2022 and jan 2023,  [facility]   IR ANGIO INTRA EXTRACRAN SEL COM CAROTID INNOMINATE BILAT MOD SED  02/10/2020   IR ANGIO VERTEBRAL SEL VERTEBRAL BILAT MOD SED  02/10/2020   IR US GUIDE VASC ACCESS RIGHT  02/10/2020   LEEP     LITHOTRIPSY     RE-EXCISION OF BREAST LUMPECTOMY Left 12/24/2019   Procedure: RE-EXCISION OF LEFT BREAST  LUMPECTOMY;  Surgeon: Erroll Luna, MD;  Location: [facility];  Service: General;  Laterality: Left;   RENAL ARTERY STENT Left    WISDOM TOOTH EXTRACTION      Prior to Admission medications   Medication Sig Start Date End Date Taking? Authorizing Provider  Adalimumab (HUMIRA PEN) 40 MG/0.4ML PNKT INJECT 40 MG (1 PEN) UNDER THE SKIN EVERY 14 DAYS 11/24/22  Yes Rishab Stoudt, Lajuan Lines, MD  amLODipine (NORVASC) 5 MG tablet TAKE 1 TABLET DAILY 09/13/22  Yes Roma Schanz R, DO  ciclopirox (PENLAC) 8 % solution Apply topically at bedtime. Apply over nail and surrounding skin. Apply daily over previous coat. After seven (7) days, may remove with alcohol and continue cycle. 04/25/22  Yes Roma Schanz R, DO  desloratadine (CLARINEX) 5 MG tablet Take 5 mg by mouth daily.  08/29/18  Yes [provider]  ezetimibe (ZETIA) 10 MG tablet TAKE 1 TABLET DAILY 07/07/22  Yes Carollee Herter, Kendrick Fries R, DO  FLUoxetine (PROZAC) 20 MG capsule TAKE 1 CAPSULE DAILY 11/27/22  Yes Ann Held, DO  icosapent Ethyl (VASCEPA) 1 g capsule Take 2 capsules (2 g total) by mouth 2 (two) times daily. 11/29/21  Yes Roma Schanz R, DO  indapamide (LOZOL) 1.25 MG tablet Take 1.25 mg by mouth daily.  11/22/17  Yes [provider]  levocetirizine (XYZAL) 5  MG tablet Take 1 tablet (5 mg total) by mouth every evening. 06/04/18  Yes Bardelas, Jose A, MD  mesalamine (LIALDA) 1.2 g EC tablet TAKE TWO TABLETS BY MOUTH TWICE DAILY 11/24/22  Yes Ottilie Wigglesworth, Lajuan Lines, MD  montelukast (SINGULAIR) 10 MG tablet Take 1 tablet (10 mg total) by mouth at bedtime. 06/04/18  Yes Bardelas, Jose A, MD  POTASSIUM CITRATE PO Take 108 mg by mouth 2 (two) times daily.   Yes [provider]  Semaglutide-Weight Management (WEGOVY) 2.4 MG/0.75ML SOAJ Inject 2.4 mg into the skin once a week. 10/02/22  Yes Roma Schanz R, DO  vitamin B-12 (CYANOCOBALAMIN) 1000 MCG tablet Take 1,000 mcg by mouth daily.   Yes [provider]  Vitamin D, Ergocalciferol, (DRISDOL) 1.25 MG (50000 UNIT) CAPS capsule TAKE 1 CAPSULE EVERY 7 DAYS 11/13/22  Yes Roma Schanz R, DO  albuterol (VENTOLIN HFA) 108 (90 Base) MCG/ACT inhaler Inhale 1-2 puffs into the lungs every 4 (four) hours as needed for wheezing or shortness of breath. 03/03/21   Copland, Gay Filler, MD  fluticasone (FLONASE) 50 MCG/ACT nasal spray Place 1 spray into both nostrils daily.    [provider]  fluticasone (FLOVENT HFA) 110 MCG/ACT inhaler Inhale 2 puffs into the lungs 2 (two) times daily. 03/03/21   Copland, Gay Filler, MD  HYDROcodone-acetaminophen (NORCO/VICODIN) 5-325 MG tablet Take 2 tablets by mouth every 4 (four) hours as needed. Patient not taking: Reported on 11/24/2022 12/06/21   Sherrill Raring, PA-C  OVER THE COUNTER MEDICATION Take 7 drops by mouth 2 (two) times daily. CBD oil    [provider]  rosuvastatin (CRESTOR) 10 MG tablet TAKE 1 TABLET DAILY Patient not taking: Reported on 01/01/2023 09/11/22   Ann Held, DO    Current Outpatient Medications  Medication Sig Dispense Refill   Adalimumab (HUMIRA PEN) 40 MG/0.4ML PNKT INJECT 40 MG (1 PEN) UNDER THE SKIN EVERY 14 DAYS 2 each 1   amLODipine (NORVASC) 5 MG tablet TAKE 1 TABLET DAILY 90 tablet 1   ciclopirox (PENLAC) 8 % solution Apply topically at bedtime. Apply over nail and surrounding skin. Apply daily over previous coat. After seven (7) days, may remove with alcohol and continue cycle. 6.6 mL 5   desloratadine (CLARINEX) 5 MG tablet Take 5 mg by mouth daily.   4   ezetimibe (ZETIA) 10 MG tablet TAKE 1 TABLET DAILY 90 tablet 3   FLUoxetine (PROZAC) 20 MG capsule TAKE 1 CAPSULE DAILY 90 capsule 1   icosapent Ethyl (VASCEPA) 1 g capsule Take 2 capsules (2 g total) by mouth 2 (two) times daily. 120 capsule 5   indapamide (LOZOL) 1.25 MG tablet Take 1.25 mg by mouth daily.   9   levocetirizine (XYZAL) 5 MG tablet Take 1 tablet (5 mg total) by mouth every  evening. 30 tablet 0   mesalamine (LIALDA) 1.2 g EC tablet TAKE TWO TABLETS BY MOUTH TWICE DAILY 360 tablet 1   montelukast (SINGULAIR) 10 MG tablet Take 1 tablet (10 mg total) by mouth at bedtime. 30 tablet 0   POTASSIUM CITRATE PO Take 108 mg by mouth 2 (two) times daily.     Semaglutide-Weight Management (WEGOVY) 2.4 MG/0.75ML SOAJ Inject 2.4 mg into the skin once a week. 9 mL 1   vitamin B-12 (CYANOCOBALAMIN) 1000 MCG tablet Take 1,000 mcg by mouth daily.     Vitamin D, Ergocalciferol, (DRISDOL) 1.25 MG (50000 UNIT) CAPS capsule TAKE 1 CAPSULE EVERY 7 DAYS 12  capsule 0   albuterol (VENTOLIN HFA) 108 (90 Base) MCG/ACT inhaler Inhale 1-2 puffs into the lungs every 4 (four) hours as needed for wheezing or shortness of breath. 18 g 3   fluticasone (FLONASE) 50 MCG/ACT nasal spray Place 1 spray into both nostrils daily.     fluticasone (FLOVENT HFA) 110 MCG/ACT inhaler Inhale 2 puffs into the lungs 2 (two) times daily. 1 each 0   HYDROcodone-acetaminophen (NORCO/VICODIN) 5-325 MG tablet Take 2 tablets by mouth every 4 (four) hours as needed. (Patient not taking: Reported on 11/24/2022) 6 tablet 0   OVER THE COUNTER MEDICATION Take 7 drops by mouth 2 (two) times daily. CBD oil     rosuvastatin (CRESTOR) 10 MG tablet TAKE 1 TABLET DAILY (Patient not taking: Reported on 01/01/2023) 90 tablet 3   Current Facility-Administered Medications  Medication Dose Route Frequency Provider Last Rate Last Admin   0.9 %  sodium chloride infusion  500 mL Intravenous Once Ransom Nickson, Lajuan Lines, MD        Allergies as of 01/01/2023 - Review Complete 01/01/2023  Allergen Reaction Noted   Azithromycin Hives 12/12/2016   Nitrofurantoin Other (See Comments) 05/05/2015   Tamiflu [oseltamivir phosphate] Hives 02/18/2019    Family History  Problem Relation Age of Onset   Allergic rhinitis Mother    Food Allergy Mother        peanut   Dementia Father    Alzheimer's disease Father    Asthma Brother    Colon cancer Paternal  Grandmother    Breast cancer Cousin    Hyperlipidemia Other    Hypertension Other    Diabetes Other    Crohn's disease Neg Hx    Esophageal cancer Neg Hx    Stomach cancer Neg Hx    Rectal cancer Neg Hx    Angioedema Neg Hx    Eczema Neg Hx    Immunodeficiency Neg Hx    Urticaria Neg Hx     Social History   Socioeconomic History   Marital status: Married    Spouse name: Not on file   Number of children: Not on file   Years of education: Not on file   Highest education level: Not on file  Occupational History   Not on file  Tobacco Use   Smoking status: Former    Packs/day: 1.00    Years: 10.00    Total pack years: 10.00    Types: Cigarettes    Quit date: 12/26/1995    Years since quitting: 27.0   Smokeless tobacco: Never  Vaping Use   Vaping Use: Never used  Substance and Sexual Activity   Alcohol use: Yes    Comment: occasional wine    Drug use: No   Sexual activity: Yes  Other Topics Concern   Not on file  Social History Narrative   Occupation: Babysit   Married   Regular exercise- yes   Social Determinants of Radio broadcast assistant Strain: Not on file  Food Insecurity: Not on file  Transportation Needs: Not on file  Physical Activity: Not on file  Stress: Not on file  Social Connections: Not on file  Intimate Partner Violence: Not on file    Physical Exam: Vital signs in last 24 hours: '@BP'$  111/74   Pulse 78   Temp 97.8 F (36.6 C)   Ht '5\' 4"'$  (1.626 m)   Wt 188 lb (85.3 kg)   SpO2 95%   BMI 32.27 kg/m  GEN: NAD EYE: Sclerae anicteric  ENT: MMM CV: Non-tachycardic Pulm: CTA b/l GI: Soft, NT/ND NEURO:  Alert & Oriented x 3   Zenovia Jarred, MD [facility]  01/01/2023 9:05 AM

## 2023-01-02 ENCOUNTER — Telehealth: Payer: Self-pay | Admitting: *Deleted

## 2023-01-02 NOTE — Telephone Encounter (Signed)
  Follow up Call-     01/01/2023    8:15 AM  Call back number  Post procedure Call Back phone  # [phone]  Permission to leave phone message Yes     Patient questions:  Do you have a fever, pain , or abdominal swelling? No. Pain Score  0 *  Have you tolerated food without any problems? Yes.    Have you been able to return to your normal activities? Yes.    Do you have any questions about your discharge instructions: Diet   No. Medications  No. Follow up visit  No.  Do you have questions or concerns about your Care? No.  Actions: * If pain score is 4 or above: No action needed, pain <4.

## 2023-01-14 ENCOUNTER — Encounter: Payer: Self-pay | Admitting: Internal Medicine

## 2023-01-14 ENCOUNTER — Encounter: Payer: Self-pay | Admitting: Family Medicine

## 2023-01-18 ENCOUNTER — Other Ambulatory Visit: Payer: Self-pay

## 2023-01-18 MED ORDER — HUMIRA (2 PEN) 40 MG/0.4ML ~~LOC~~ AJKT: AUTO-INJECTOR | SUBCUTANEOUS | 11 refills | Status: DC

## 2023-01-19 ENCOUNTER — Ambulatory Visit: Payer: Commercial Managed Care - PPO | Admitting: Family Medicine

## 2023-01-29 ENCOUNTER — Encounter: Payer: Self-pay | Admitting: Internal Medicine

## 2023-02-01 ENCOUNTER — Other Ambulatory Visit (HOSPITAL_COMMUNITY): Payer: Self-pay

## 2023-02-01 ENCOUNTER — Telehealth: Payer: Self-pay | Admitting: Pharmacy Technician

## 2023-02-01 NOTE — Telephone Encounter (Signed)
PA is approved and letter has been scanned in.

## 2023-02-01 NOTE — Telephone Encounter (Signed)
Patient Advocate Encounter  Prior Authorization for HUMIRA '40MG'$  has been approved.    PA# [id number] Effective dates: 1.9.24 through 8.6.24

## 2023-02-01 NOTE — Telephone Encounter (Signed)
Called [employer]. When I tried to submit a new PA for this medication, they said a PA had already been submitted via mail. It was still waiting for clinical questions to be answered by 2.16.24. I don't see where questions have been received via fax. Requested for questions to faxed to [phone]. Will complete and send back as soon as it's received.

## 2023-02-05 ENCOUNTER — Other Ambulatory Visit: Payer: Self-pay | Admitting: Family Medicine

## 2023-03-15 ENCOUNTER — Other Ambulatory Visit: Payer: Self-pay | Admitting: Family Medicine

## 2023-04-11 LAB — CBC AND DIFFERENTIAL
HCT: 42 (ref 36–46)
Hemoglobin: 14.3 (ref 12.0–16.0)
Platelets: 288 10*3/uL (ref 150–400)
WBC: 5.8

## 2023-04-11 LAB — COMPREHENSIVE METABOLIC PANEL
Albumin: 4.4 (ref 3.5–5.0)
Calcium: 9.8 (ref 8.7–10.7)
Globulin: 2.5
eGFR: 96

## 2023-04-11 LAB — BASIC METABOLIC PANEL
BUN: 19 (ref 4–21)
CO2: 29 — AB (ref 13–22)
Chloride: 100 (ref 99–108)
Creatinine: 0.7 (ref 0.5–1.1)
Glucose: 106
Potassium: 3.8 mEq/L (ref 3.5–5.1)
Sodium: 140 (ref 137–147)

## 2023-04-11 LAB — LIPID PANEL
Cholesterol: 200 (ref 0–200)
HDL: 64 (ref 35–70)
LDL Cholesterol: 118
Triglycerides: 29 — AB (ref 40–160)

## 2023-04-11 LAB — HEPATIC FUNCTION PANEL
ALT: 22 U/L (ref 7–35)
AST: 21 (ref 13–35)
Alkaline Phosphatase: 66 (ref 25–125)
Bilirubin, Total: 0.4

## 2023-04-11 LAB — CBC: RBC: 4.76 (ref 3.87–5.11)

## 2023-04-12 LAB — HEMOGLOBIN A1C: A1c: 5.8

## 2023-04-16 ENCOUNTER — Encounter: Payer: Self-pay | Admitting: Family Medicine

## 2023-04-16 ENCOUNTER — Ambulatory Visit: Payer: Commercial Managed Care - PPO | Admitting: Family Medicine

## 2023-04-16 VITALS — BP 122/80 | HR 67 | Temp 98.3°F | Resp 18 | Ht 64.0 in | Wt 189.8 lb

## 2023-04-16 DIAGNOSIS — R82998 Other abnormal findings in urine: Secondary | ICD-10-CM | POA: Diagnosis not present

## 2023-04-16 DIAGNOSIS — R300 Dysuria: Secondary | ICD-10-CM | POA: Diagnosis not present

## 2023-04-16 DIAGNOSIS — I10 Essential (primary) hypertension: Secondary | ICD-10-CM | POA: Diagnosis not present

## 2023-04-16 DIAGNOSIS — E785 Hyperlipidemia, unspecified: Secondary | ICD-10-CM | POA: Diagnosis not present

## 2023-04-16 LAB — POC URINALSYSI DIPSTICK (AUTOMATED)
Bilirubin, UA: NEGATIVE
Blood, UA: NEGATIVE
Glucose, UA: NEGATIVE
Ketones, UA: NEGATIVE
Nitrite, UA: NEGATIVE
Protein, UA: NEGATIVE
Spec Grav, UA: 1.01 (ref 1.010–1.025)
Urobilinogen, UA: 0.2 E.U./dL
pH, UA: 7.5 (ref 5.0–8.0)

## 2023-04-16 NOTE — Assessment & Plan Note (Signed)
Check ua Pt states it does not feel like a kidney stone

## 2023-04-16 NOTE — Progress Notes (Signed)
Subjective:   By signing my name below, I, Lindsey [name], attest that this documentation has been prepared under the direction and in the presence of Seabron Spates R, DO. 04/16/2023.   Patient ID: Lindsey [name], female    DOB: [birth year], 57 y.o.   MRN: [id number]  Chief Complaint  Patient presents with   Discuss labs    HPI Patient is in today for an office visit and for follow-up of blood work performed by [facility]. She reports blood work was performed last week and presents results via her MyChart portal, personally reviewed. Her total cholesterol was 200, LDL 118 (38 as of 09/2022). Lately, she has not been taking her 10 mg rosuvastatin due to side effects of aching myalgias in her bilateral legs. We discussed other dosing options such as trying 5 mg rosuvastatin on MWF.  Due to participating in the study she has a Dexcom continuous glucose monitor. Per the labs last week her A1C has decreased.  Additionally she believes she currently has a UTI or kidney stone. She complains of some dysuria and lower abdominal pain similar to her prior UTI's. There is no radiation of her abdominal pain.   Past Medical History:  Diagnosis Date   Allergy    Anxiety    Breast cancer    Cancer    Colitis    Crohn's disease    Depression    Diverticulosis    Hyperlipidemia    Hypertension    Mild intermittent asthma    rarely uses inhaler   Nephrolithiasis    Personal history of radiation therapy    PONV (postoperative nausea and vomiting)    Stroke     Past Surgical History:  Procedure Laterality Date   BREAST BIOPSY Left 10/17/2019   BREAST LUMPECTOMY Left 12/02/2019   BREAST LUMPECTOMY WITH RADIOACTIVE SEED LOCALIZATION Left 12/02/2019   Procedure: LEFT BREAST LUMPECTOMY WITH RADIOACTIVE SEED LOCALIZATION;  Surgeon: Harriette Bouillon, MD;  Location: [facility];  Service: General;  Laterality: Left;   COMBINED HYSTEROSCOPY DIAGNOSTIC / D&C     CYSTOSCOPY  W/ URETEROSCOPY W/ LITHOTRIPSY     dec 2022 and jan 2023,  [facility]   IR ANGIO INTRA EXTRACRAN SEL COM CAROTID INNOMINATE BILAT MOD SED  02/10/2020   IR ANGIO VERTEBRAL SEL VERTEBRAL BILAT MOD SED  02/10/2020   IR US GUIDE VASC ACCESS RIGHT  02/10/2020   LEEP     LITHOTRIPSY     RE-EXCISION OF BREAST LUMPECTOMY Left 12/24/2019   Procedure: RE-EXCISION OF LEFT BREAST LUMPECTOMY;  Surgeon: Harriette Bouillon, MD;  Location: [facility];  Service: General;  Laterality: Left;   RENAL ARTERY STENT Left    WISDOM TOOTH EXTRACTION      Family History  Problem Relation Age of Onset   Allergic rhinitis Mother    Food Allergy Mother        peanut   Dementia Father    Alzheimer's disease Father    Asthma Brother    Colon cancer Paternal Grandmother    Breast cancer Cousin    Hyperlipidemia Other    Hypertension Other    Diabetes Other    Crohn's disease Neg Hx    Esophageal cancer Neg Hx    Stomach cancer Neg Hx    Rectal cancer Neg Hx    Angioedema Neg Hx    Eczema Neg Hx    Immunodeficiency Neg Hx    Urticaria Neg Hx     Social  History   Socioeconomic History   Marital status: Married    Spouse name: Not on file   Number of children: Not on file   Years of education: Not on file   Highest education level: Not on file  Occupational History   Not on file  Tobacco Use   Smoking status: Former    Packs/day: 1.00    Years: 10.00    Additional pack years: 0.00    Total pack years: 10.00    Types: Cigarettes    Quit date: 12/26/1995    Years since quitting: 27.3   Smokeless tobacco: Never  Vaping Use   Vaping Use: Never used  Substance and Sexual Activity   Alcohol use: Yes    Comment: occasional wine    Drug use: No   Sexual activity: Yes  Other Topics Concern   Not on file  Social History Narrative   Occupation: Babysit   Married   Regular exercise- yes   Social Determinants of Corporate investment banker Strain: Not on file  Food Insecurity: Not on  file  Transportation Needs: Not on file  Physical Activity: Not on file  Stress: Not on file  Social Connections: Not on file  Intimate Partner Violence: Not on file    Outpatient Medications Prior to Visit  Medication Sig Dispense Refill   Adalimumab (HUMIRA, 2 PEN,) 40 MG/0.4ML PNKT INJECT 40 MG (1 PEN) UNDER THE SKIN EVERY 14 DAYS 2 each 11   albuterol (VENTOLIN HFA) 108 (90 Base) MCG/ACT inhaler Inhale 1-2 puffs into the lungs every 4 (four) hours as needed for wheezing or shortness of breath. 18 g 3   amLODipine (NORVASC) 5 MG tablet Take 1 tablet (5 mg total) by mouth daily. 90 tablet 0   ciclopirox (PENLAC) 8 % solution Apply topically at bedtime. Apply over nail and surrounding skin. Apply daily over previous coat. After seven (7) days, may remove with alcohol and continue cycle. 6.6 mL 5   desloratadine (CLARINEX) 5 MG tablet Take 5 mg by mouth daily.   4   ezetimibe (ZETIA) 10 MG tablet TAKE 1 TABLET DAILY 90 tablet 3   FLUoxetine (PROZAC) 20 MG capsule TAKE 1 CAPSULE DAILY 90 capsule 1   fluticasone (FLONASE) 50 MCG/ACT nasal spray Place 1 spray into both nostrils daily.     fluticasone (FLOVENT HFA) 110 MCG/ACT inhaler Inhale 2 puffs into the lungs 2 (two) times daily. 1 each 0   indapamide (LOZOL) 1.25 MG tablet Take 1.25 mg by mouth daily.   9   levocetirizine (XYZAL) 5 MG tablet Take 1 tablet (5 mg total) by mouth every evening. 30 tablet 0   metFORMIN (GLUCOPHAGE-XR) 500 MG 24 hr tablet Take 500 mg by mouth 2 (two) times daily.     montelukast (SINGULAIR) 10 MG tablet Take 1 tablet (10 mg total) by mouth at bedtime. 30 tablet 0   OVER THE COUNTER MEDICATION Take 7 drops by mouth 2 (two) times daily. CBD oil     POTASSIUM CITRATE PO Take 108 mg by mouth 2 (two) times daily.     vitamin B-12 (CYANOCOBALAMIN) 1000 MCG tablet Take 1,000 mcg by mouth daily.     Vitamin D, Ergocalciferol, (DRISDOL) 1.25 MG (50000 UNIT) CAPS capsule TAKE 1 CAPSULE EVERY 7 DAYS 12 capsule 3    icosapent Ethyl (VASCEPA) 1 g capsule Take 2 capsules (2 g total) by mouth 2 (two) times daily. 120 capsule 5   mesalamine (LIALDA) 1.2 g EC  tablet Take 2 tablets (2.4 g total) by mouth daily. 360 tablet 1   Semaglutide-Weight Management (WEGOVY) 2.4 MG/0.75ML SOAJ Inject 2.4 mg into the skin once a week. 9 mL 1   rosuvastatin (CRESTOR) 10 MG tablet TAKE 1 TABLET DAILY (Patient not taking: Reported on 01/01/2023) 90 tablet 3   HYDROcodone-acetaminophen (NORCO/VICODIN) 5-325 MG tablet Take 2 tablets by mouth every 4 (four) hours as needed. (Patient not taking: Reported on 11/24/2022) 6 tablet 0   No facility-administered medications prior to visit.    Allergies  Allergen Reactions   Azithromycin Hives   Nitrofurantoin Other (See Comments)    abd pain   Tamiflu [Oseltamivir Phosphate] Hives    Review of Systems  Constitutional:  Negative for fever and malaise/fatigue.  HENT:  Negative for congestion.   Eyes:  Negative for blurred vision.  Respiratory:  Negative for shortness of breath.   Cardiovascular:  Negative for chest pain, palpitations and leg swelling.  Gastrointestinal:  Positive for abdominal pain (Lower). Negative for blood in stool and nausea.  Genitourinary:  Positive for dysuria. Negative for frequency.  Musculoskeletal:  Negative for falls.  Skin:  Negative for rash.  Neurological:  Negative for dizziness, loss of consciousness and headaches.  Endo/Heme/Allergies:  Negative for environmental allergies.  Psychiatric/Behavioral:  Negative for depression. The patient is not nervous/anxious.        Objective:    Physical Exam Vitals and nursing note reviewed.  Constitutional:      Appearance: Normal appearance.  HENT:     Head: Normocephalic and atraumatic.     Right Ear: Tympanic membrane, ear canal and external ear normal.     Left Ear: Tympanic membrane, ear canal and external ear normal.  Eyes:     Extraocular Movements: Extraocular movements intact.     Pupils:  Pupils are equal, round, and reactive to light.  Cardiovascular:     Rate and Rhythm: Normal rate and regular rhythm.     Heart sounds: Normal heart sounds. No murmur heard.    No gallop.  Pulmonary:     Effort: Pulmonary effort is normal. No respiratory distress.     Breath sounds: Normal breath sounds. No wheezing or rales.  Musculoskeletal:        General: Normal range of motion.  Skin:    General: Skin is warm and dry.  Neurological:     General: No focal deficit present.     Mental Status: She is alert and oriented to person, place, and time.  Psychiatric:        Mood and Affect: Mood normal.        Behavior: Behavior normal.     BP 122/80 (BP Location: Right Arm, Patient Position: Sitting, Cuff Size: Normal)   Pulse 67   Temp 98.3 F (36.8 C) (Oral)   Resp 18   Ht 5\' 4"  (1.626 m)   Wt 189 lb 12.8 oz (86.1 kg)   SpO2 98%   BMI 32.58 kg/m  Wt Readings from Last 3 Encounters:  04/16/23 189 lb 12.8 oz (86.1 kg)  01/01/23 188 lb (85.3 kg)  11/24/22 188 lb 2 oz (85.3 kg)    Diabetic Foot Exam - Simple   No data filed    Lab Results  Component Value Date   WBC 5.6 10/01/2022   HGB 15.2 10/11/2022   HCT 45 10/11/2022   PLT 257 10/11/2022   GLUCOSE 152 (H) 09/18/2022   CHOL 109 10/11/2022   TRIG 76 10/11/2022  HDL 55 10/11/2022   LDLDIRECT 123.2 12/20/2011   LDLCALC 38 10/11/2022   ALT 37 (A) 10/11/2022   AST 32 10/11/2022   NA 140 10/11/2022   K 3.7 10/11/2022   CL 102 10/11/2022   CREATININE 0.7 10/11/2022   BUN 16 10/11/2022   CO2 27 (A) 10/11/2022   TSH 2.31 10/11/2022   INR 1.0 02/05/2020   HGBA1C 6.2 10/11/2022   MICROALBUR 26.3 11/08/2021    Lab Results  Component Value Date   TSH 2.31 10/11/2022   Lab Results  Component Value Date   WBC 5.6 10/01/2022   HGB 15.2 10/11/2022   HCT 45 10/11/2022   MCV 87.7 09/18/2022   PLT 257 10/11/2022   Lab Results  Component Value Date   NA 140 10/11/2022   K 3.7 10/11/2022   CO2 27 (A)  10/11/2022   GLUCOSE 152 (H) 09/18/2022   BUN 16 10/11/2022   CREATININE 0.7 10/11/2022   BILITOT 0.7 09/18/2022   ALKPHOS 75 10/11/2022   AST 32 10/11/2022   ALT 37 (A) 10/11/2022   PROT 7.4 09/18/2022   ALBUMIN 4.3 10/11/2022   CALCIUM 9.4 10/11/2022   ANIONGAP 12 12/06/2021   EGFR 102 10/11/2022   GFR 81.40 09/18/2022   Lab Results  Component Value Date   CHOL 109 10/11/2022   Lab Results  Component Value Date   HDL 55 10/11/2022   Lab Results  Component Value Date   LDLCALC 38 10/11/2022   Lab Results  Component Value Date   TRIG 76 10/11/2022   Lab Results  Component Value Date   CHOLHDL 2 09/18/2022   Lab Results  Component Value Date   HGBA1C 6.2 10/11/2022       Assessment & Plan:   Problem List Items Addressed This Visit       Unprioritized   Primary hypertension    Well controlled, no changes to meds. Encouraged heart healthy diet such as the DASH diet and exercise as tolerated.        Hyperlipidemia LDL goal <100    Encourage heart healthy diet such as MIND or DASH diet, increase exercise, avoid trans fats, simple carbohydrates and processed foods, consider a krill or fish or flaxseed oil cap daily.        Hyperlipidemia   Dysuria - Primary    Check ua Pt states it does not feel like a kidney stone      Relevant Orders   POCT Urinalysis Dipstick (Automated) (Completed)   Urine Culture   Other Visit Diagnoses     Leukocytes in urine       Relevant Orders   Urine Culture        No orders of the defined types were placed in this encounter.   IDonato Schultz, DO, personally preformed the services described in this documentation.  All medical record entries made by the scribe were at my direction and in my presence.  I have reviewed the chart and discharge instructions (if applicable) and agree that the record reflects my personal performance and is accurate and complete. 04/16/2023.  I,Mathew Stumpf,acting as a Neurosurgeon for  [employer], DO.,have documented all relevant documentation on the behalf of Donato Schultz, DO,as directed by  Donato Schultz, DO while in the presence of Donato Schultz, DO.   Donato Schultz, DO

## 2023-04-16 NOTE — Assessment & Plan Note (Signed)
Well controlled, no changes to meds. Encouraged heart healthy diet such as the DASH diet and exercise as tolerated.  °

## 2023-04-16 NOTE — Assessment & Plan Note (Addendum)
Encourage heart healthy diet such as MIND or DASH diet, increase exercise, avoid trans fats, simple carbohydrates and processed foods, consider a krill or fish or flaxseed oil cap daily.   Labs reviewed on pt phone-- she will print them out and get them to [place] to put in her chart

## 2023-04-17 LAB — URINE CULTURE
MICRO NUMBER:: 14855771
Result:: NO GROWTH
SPECIMEN QUALITY:: ADEQUATE

## 2023-04-19 ENCOUNTER — Encounter: Payer: Self-pay | Admitting: *Deleted

## 2023-05-08 ENCOUNTER — Ambulatory Visit: Payer: Commercial Managed Care - PPO | Admitting: Family

## 2023-05-08 ENCOUNTER — Other Ambulatory Visit (HOSPITAL_BASED_OUTPATIENT_CLINIC_OR_DEPARTMENT_OTHER): Payer: Self-pay

## 2023-05-08 VITALS — BP 133/60 | HR 68 | Temp 97.6°F | Resp 16 | Wt 190.0 lb

## 2023-05-08 DIAGNOSIS — R350 Frequency of micturition: Secondary | ICD-10-CM | POA: Diagnosis not present

## 2023-05-08 LAB — POC URINALSYSI DIPSTICK (AUTOMATED)
Bilirubin, UA: NEGATIVE
Blood, UA: NEGATIVE
Glucose, UA: NEGATIVE
Ketones, UA: NEGATIVE
Leukocytes, UA: NEGATIVE
Nitrite, UA: NEGATIVE
Protein, UA: NEGATIVE
Spec Grav, UA: 1.02 (ref 1.010–1.025)
Urobilinogen, UA: 0.2 E.U./dL
pH, UA: 6 (ref 5.0–8.0)

## 2023-05-08 MED ORDER — CEPHALEXIN 500 MG PO CAPS
500.0000 mg | ORAL_CAPSULE | Freq: Two times a day (BID) | ORAL | 0 refills | Status: DC
Start: 2023-05-08 — End: 2024-01-16
  Filled 2023-05-08: qty 10, 5d supply, fill #0

## 2023-05-08 NOTE — Assessment & Plan Note (Signed)
New. UA clear but hx concerning for UTI. Will send urine for culture, begin empiric Keflex.  Pt advised to call if symptoms worsen or if symptoms fail to improve.

## 2023-05-08 NOTE — Progress Notes (Signed)
Subjective:   By signing my name below, I, Isabelle Course, attest that this documentation has been prepared under the direction and in the presence of Lemont Fillers, NP 05/08/23   Patient ID: Lindsey [name], female    DOB: [birth year], 57 y.o.   MRN: [id number]  Chief Complaint  Patient presents with   Urinary Frequency    Patient reports having urinary frequency and "bladder pressure"    HPI Patient is in today for an office visit.   Urinary frequency: She complains of urinary frequency and pelvic pressure beginning 2 days ago. She also endorses back pain and notes it is mild in comparison to back pain when she had kidney stones.   Past Medical History:  Diagnosis Date   Allergy    Anxiety    Breast cancer (HCC)    Cancer (HCC)    Colitis    Crohn's disease (HCC)    Depression    Diverticulosis    Hyperlipidemia    Hypertension    Mild intermittent asthma    rarely uses inhaler   Nephrolithiasis    Personal history of radiation therapy    PONV (postoperative nausea and vomiting)    Stroke [facility])     Past Surgical History:  Procedure Laterality Date   BREAST BIOPSY Left 10/17/2019   BREAST LUMPECTOMY Left 12/02/2019   BREAST LUMPECTOMY WITH RADIOACTIVE SEED LOCALIZATION Left 12/02/2019   Procedure: LEFT BREAST LUMPECTOMY WITH RADIOACTIVE SEED LOCALIZATION;  Surgeon: Harriette Bouillon, MD;  Location: [facility];  Service: General;  Laterality: Left;   COMBINED HYSTEROSCOPY DIAGNOSTIC / D&C     CYSTOSCOPY W/ URETEROSCOPY W/ LITHOTRIPSY     dec 2022 and jan 2023,  [facility]   IR ANGIO INTRA EXTRACRAN SEL COM CAROTID INNOMINATE BILAT MOD SED  02/10/2020   IR ANGIO VERTEBRAL SEL VERTEBRAL BILAT MOD SED  02/10/2020   IR US GUIDE VASC ACCESS RIGHT  02/10/2020   LEEP     LITHOTRIPSY     RE-EXCISION OF BREAST LUMPECTOMY Left 12/24/2019   Procedure: RE-EXCISION OF LEFT BREAST LUMPECTOMY;  Surgeon: Harriette Bouillon, MD;  Location: [facility];  Service: General;  Laterality: Left;   RENAL ARTERY STENT Left    WISDOM TOOTH EXTRACTION      Family History  Problem Relation Age of Onset   Allergic rhinitis Mother    Food Allergy Mother        peanut   Dementia Father    Alzheimer's disease Father    Asthma Brother    Colon cancer Paternal Grandmother    Breast cancer Cousin    Hyperlipidemia Other    Hypertension Other    Diabetes Other    Crohn's disease Neg Hx    Esophageal cancer Neg Hx    Stomach cancer Neg Hx    Rectal cancer Neg Hx    Angioedema Neg Hx    Eczema Neg Hx    Immunodeficiency Neg Hx    Urticaria Neg Hx     Social History   Socioeconomic History   Marital status: Married    Spouse name: Not on file   Number of children: Not on file   Years of education: Not on file   Highest education level: Not on file  Occupational History   Not on file  Tobacco Use   Smoking status: Former    Packs/day: 1.00    Years: 10.00    Additional pack years: 0.00    Total  pack years: 10.00    Types: Cigarettes    Quit date: 12/26/1995    Years since quitting: 27.3   Smokeless tobacco: Never  Vaping Use   Vaping Use: Never used  Substance and Sexual Activity   Alcohol use: Yes    Comment: occasional wine    Drug use: No   Sexual activity: Yes  Other Topics Concern   Not on file  Social History Narrative   Occupation: Babysit   Married   Regular exercise- yes   Social Determinants of Corporate investment banker Strain: Not on file  Food Insecurity: Not on file  Transportation Needs: Not on file  Physical Activity: Not on file  Stress: Not on file  Social Connections: Not on file  Intimate Partner Violence: Not on file    Outpatient Medications Prior to Visit  Medication Sig Dispense Refill   Adalimumab (HUMIRA, 2 PEN,) 40 MG/0.4ML PNKT INJECT 40 MG (1 PEN) UNDER THE SKIN EVERY 14 DAYS 2 each 11   albuterol (VENTOLIN HFA) 108 (90 Base) MCG/ACT inhaler Inhale 1-2 puffs into the lungs  every 4 (four) hours as needed for wheezing or shortness of breath. 18 g 3   amLODipine (NORVASC) 5 MG tablet Take 1 tablet (5 mg total) by mouth daily. 90 tablet 0   ciclopirox (PENLAC) 8 % solution Apply topically at bedtime. Apply over nail and surrounding skin. Apply daily over previous coat. After seven (7) days, may remove with alcohol and continue cycle. 6.6 mL 5   desloratadine (CLARINEX) 5 MG tablet Take 5 mg by mouth daily.   4   ezetimibe (ZETIA) 10 MG tablet TAKE 1 TABLET DAILY 90 tablet 3   FLUoxetine (PROZAC) 20 MG capsule TAKE 1 CAPSULE DAILY 90 capsule 1   fluticasone (FLONASE) 50 MCG/ACT nasal spray Place 1 spray into both nostrils daily.     fluticasone (FLOVENT HFA) 110 MCG/ACT inhaler Inhale 2 puffs into the lungs 2 (two) times daily. 1 each 0   indapamide (LOZOL) 1.25 MG tablet Take 1.25 mg by mouth daily.   9   levocetirizine (XYZAL) 5 MG tablet Take 1 tablet (5 mg total) by mouth every evening. 30 tablet 0   metFORMIN (GLUCOPHAGE-XR) 500 MG 24 hr tablet Take 500 mg by mouth 2 (two) times daily.     montelukast (SINGULAIR) 10 MG tablet Take 1 tablet (10 mg total) by mouth at bedtime. 30 tablet 0   OVER THE COUNTER MEDICATION Take 7 drops by mouth 2 (two) times daily. CBD oil     POTASSIUM CITRATE PO Take 108 mg by mouth 2 (two) times daily.     rosuvastatin (CRESTOR) 10 MG tablet TAKE 1 TABLET DAILY 90 tablet 3   vitamin B-12 (CYANOCOBALAMIN) 1000 MCG tablet Take 1,000 mcg by mouth daily.     Vitamin D, Ergocalciferol, (DRISDOL) 1.25 MG (50000 UNIT) CAPS capsule TAKE 1 CAPSULE EVERY 7 DAYS 12 capsule 3   No facility-administered medications prior to visit.    Allergies  Allergen Reactions   Azithromycin Hives   Nitrofurantoin Other (See Comments)    abd pain   Tamiflu [Oseltamivir Phosphate] Hives    Review of Systems  Genitourinary:  Positive for frequency.       (+) pelvic "pressure"       Objective:    Physical Exam Constitutional:      General: She is  not in acute distress.    Appearance: Normal appearance. She is well-developed.  HENT:  Head: Normocephalic and atraumatic.     Right Ear: External ear normal.     Left Ear: External ear normal.  Eyes:     General: No scleral icterus. Neck:     Thyroid: No thyromegaly.  Cardiovascular:     Rate and Rhythm: Normal rate and regular rhythm.     Heart sounds: Normal heart sounds. No murmur heard. Pulmonary:     Effort: Pulmonary effort is normal. No respiratory distress.     Breath sounds: Normal breath sounds. No wheezing.  Abdominal:     Tenderness: There is no right CVA tenderness or left CVA tenderness.  Genitourinary:    Comments: Mild superpubic tenderness Musculoskeletal:     Cervical back: Neck supple.  Skin:    General: Skin is warm and dry.  Neurological:     Mental Status: She is alert and oriented to person, place, and time.  Psychiatric:        Mood and Affect: Mood normal.        Behavior: Behavior normal.        Thought Content: Thought content normal.        Judgment: Judgment normal.     BP 133/60 (BP Location: Right Arm, Patient Position: Sitting, Cuff Size: Small)   Pulse 68   Temp 97.6 F (36.4 C) (Oral)   Resp 16   Wt 190 lb (86.2 kg)   SpO2 98%   BMI 32.61 kg/m  Wt Readings from Last 3 Encounters:  05/08/23 190 lb (86.2 kg)  04/16/23 189 lb 12.8 oz (86.1 kg)  01/01/23 188 lb (85.3 kg)       Assessment & Plan:  Urinary frequency Assessment & Plan: New. UA clear but hx concerning for UTI. Will send urine for culture, begin empiric Keflex.  Pt advised to call if symptoms worsen or if symptoms fail to improve.   Orders: -     POCT Urinalysis Dipstick (Automated) -     Cephalexin; Take 1 capsule (500 mg total) by mouth 2 (two) times daily.  Dispense: 10 capsule; Refill: 0 -     Urine Culture     I,Rachel Rivera,acting as a scribe for Lemont Fillers, NP.,have documented all relevant documentation on the behalf of Lemont Fillers, NP,as directed by  Lemont Fillers, NP while in the presence of Lemont Fillers, NP.   I, Lemont Fillers, NP, personally preformed the services described in this documentation.  All medical record entries made by the scribe were at my direction and in my presence.  I have reviewed the chart and discharge instructions (if applicable) and agree that the record reflects my personal performance and is accurate and complete. 05/08/23   Lemont Fillers, NP

## 2023-05-09 LAB — URINE CULTURE
MICRO NUMBER:: 14953904
SPECIMEN QUALITY:: ADEQUATE

## 2023-05-24 ENCOUNTER — Other Ambulatory Visit: Payer: Self-pay | Admitting: Family Medicine

## 2023-06-13 ENCOUNTER — Other Ambulatory Visit: Payer: Self-pay | Admitting: Family Medicine

## 2023-07-02 ENCOUNTER — Other Ambulatory Visit: Payer: Self-pay | Admitting: Family Medicine

## 2023-07-02 DIAGNOSIS — E785 Hyperlipidemia, unspecified: Secondary | ICD-10-CM

## 2023-07-20 ENCOUNTER — Telehealth: Payer: Self-pay | Admitting: Pharmacy Technician

## 2023-07-20 ENCOUNTER — Other Ambulatory Visit (HOSPITAL_COMMUNITY): Payer: Self-pay

## 2023-07-20 NOTE — Telephone Encounter (Signed)
Pharmacy Patient Advocate Encounter  Received notification from [employer] that Prior Authorization for HUMIRA 40MG  has been APPROVED from 6.26.24 to 7.26.25. Ran test claim, Copay is $ .  PA #/Case ID/Reference #: [id number]

## 2023-07-20 NOTE — Telephone Encounter (Signed)
Pharmacy Patient Advocate Encounter   Received notification from [employer] that prior authorization for HUMIRA 40MG  is required/requested.   Insurance verification completed.   The patient is insured through [employer] .   Per test claim: PA required; PA submitted to [employer] via [employer] Key/confirmation #/EOC [id number] Status is pending

## 2023-08-15 ENCOUNTER — Other Ambulatory Visit: Payer: Self-pay | Admitting: Nurse Practitioner

## 2023-08-15 DIAGNOSIS — Z1231 Encounter for screening mammogram for malignant neoplasm of breast: Secondary | ICD-10-CM

## 2023-08-22 ENCOUNTER — Other Ambulatory Visit: Payer: Self-pay | Admitting: Family Medicine

## 2023-09-06 ENCOUNTER — Other Ambulatory Visit: Payer: Self-pay | Admitting: Family Medicine

## 2023-09-06 DIAGNOSIS — E785 Hyperlipidemia, unspecified: Secondary | ICD-10-CM

## 2023-09-13 ENCOUNTER — Ambulatory Visit
Admission: RE | Admit: 2023-09-13 | Discharge: 2023-09-13 | Disposition: A | Discharge status: Home / Self Care | Payer: Commercial Managed Care - PPO | Source: Ambulatory Visit | Attending: Nurse Practitioner | Admitting: Nurse Practitioner

## 2023-09-13 DIAGNOSIS — Z1231 Encounter for screening mammogram for malignant neoplasm of breast: Secondary | ICD-10-CM

## 2023-09-27 ENCOUNTER — Inpatient Hospital Stay: Payer: Commercial Managed Care - PPO | Admitting: Hematology and Oncology

## 2023-09-27 NOTE — Progress Notes (Deleted)
No show letter sent.

## 2023-10-01 ENCOUNTER — Telehealth: Payer: Self-pay | Admitting: Hematology and Oncology

## 2023-10-01 NOTE — Telephone Encounter (Signed)
Per Staff Message on 10/01/23; I called patient and left a voice mail with her new appointment date and time. I also mailed a reminder notice.

## 2023-10-18 ENCOUNTER — Other Ambulatory Visit: Payer: Self-pay

## 2023-10-18 ENCOUNTER — Inpatient Hospital Stay: Payer: Commercial Managed Care - PPO | Attending: Hematology and Oncology | Admitting: Hematology and Oncology

## 2023-10-18 VITALS — BP 135/65 | HR 84 | Temp 97.7°F | Resp 16 | Wt 198.9 lb

## 2023-10-18 DIAGNOSIS — C50412 Malignant neoplasm of upper-outer quadrant of left female breast: Secondary | ICD-10-CM

## 2023-10-18 DIAGNOSIS — D0512 Intraductal carcinoma in situ of left breast: Secondary | ICD-10-CM | POA: Diagnosis present

## 2023-10-18 DIAGNOSIS — Z170 Estrogen receptor positive status [ER+]: Secondary | ICD-10-CM | POA: Diagnosis not present

## 2023-10-18 NOTE — Progress Notes (Signed)
[name]  [phone] [phone]     ID: Lindsey [name] DOB: October 21, 1966  MR#: [id number]  HQI#:[id number]  Patient Care Team: Lindsey [name], Lindsey Congress, DO as PCP - General Lindsey [name], Lindsey Iles, MD as Consulting Physician (Allergy and Immunology) Lindsey Bouillon, MD as Consulting Physician (General Surgery) Lindsey [name], Lindsey Hue, MD (Inactive) as Consulting Physician (Oncology) Lindsey [name], Lindsey Caddy, MD as Consulting Physician (Gastroenterology) Lindsey Maxcy, MD as Consulting Physician (Urology) Lindsey Proud, RN as Oncology Nurse Navigator Lindsey Angelica, RN as Oncology Nurse Navigator Lindsey Peak, MD as Attending Physician (Radiation Oncology) Lindsey Riley, MD as Consulting Physician (Neurology) Lindsey Spanner Madelaine Etienne, MD as Consulting Physician (Obstetrics and Gynecology) Lindsey Moulds, MD OTHER MD:  CHIEF COMPLAINT: noninvasive breast cancer, estrogen receptor positive  CURRENT TREATMENT:  Considering anastrozole  INTERVAL HISTORY:  Lindsey [name] was scheduled today for follow up of her noninvasive breast cancer.   She is doing very well overall.  She denies any new health issues.  She is going to the beach for the weekend and she is excited.  She had a mammogram in September with no evidence of malignancy.  She is exercising, eating healthy and sleeping regularly.  Rest of the pertinent 10 point ROS reviewed and negative   COVID 19 VACCINATION STATUS: Moderna x2; infection 03/2021   HISTORY OF CURRENT ILLNESS: From the original intake note:  Lindsey [name] had routine screening mammography on 10/08/2017 showing a possible abnormality in the left breast. She underwent left diagnostic mammography with tomography and left breast ultrasonography at [name] on 10/17/2017 showing: breast density category B; 9 mm indeterminate left breast mass without sonographic correlate. She was scheduled to undergo biopsy of the left  breast mass on 10/22/2017, but the mass was not clearly seen on stereotactic imaging. Left diagnostic mammogram was performed instead and showed the mass as smaller and less conspicuous. Short term follow up was recommended.   She underwent left diagnostic mammogram and left breast ultrasonography on 01/23/2018 and bilateral diagnostic mammogram on 10/11/2018. When she returned for follow up bilateral diagnostic mammogram on 10/15/2019, this showed: breast density category B; new 6 mm group of calcifications within the upper-outer left breast; interval increase in size of lobular mass within the 12 o'clock position. Left breast ultrasonography performed that day measured the 12 o'clock left breast mass at 8 mm and showed no left axillary adenopathy.  Accordingly on 10/17/2019 she proceeded to biopsy of the left breast areas in question. 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   Pathology from the left 12 o'clock mass was negative for malignancy, showing fibrocystic changes with apocrine metaplasia. However, the biopsy clip was found to be located posterior to the mammographically identified mass, so she proceeded to repeat biopsy of the area on 10/30/2019. Pathology [id number]) was again benign, showing fibrocystic changes and fat necrosis.  She opted to proceed with left breast lumpectomy on 12/02/2019 under Dr. Luisa [name]. Pathology from the procedure ([id number]) showed: intermediate grade ductal carcinoma in situ, 0.6 cm; cauterized carcinoma in situ focally present at the lateral margin. Prognostic indicators significant for: estrogen receptor 90% positive with weak staining intensity, and progesterone receptor 100% positive with strong staining  intensity.  The patient's subsequent history is as detailed below.   PAST MEDICAL  HISTORY: Past Medical History:  Diagnosis Date   Allergy    Anxiety    Breast cancer (HCC)    Cancer (HCC)    Colitis    Crohn's disease (HCC)    Depression    Diverticulosis    Hyperlipidemia    Hypertension    Mild intermittent asthma    rarely uses inhaler   Nephrolithiasis    Personal history of radiation therapy    PONV (postoperative nausea and vomiting)    Stroke (HCC)     PAST SURGICAL HISTORY: Past Surgical History:  Procedure Laterality Date   BREAST BIOPSY Left 10/17/2019   BREAST LUMPECTOMY Left 12/02/2019   BREAST LUMPECTOMY WITH RADIOACTIVE SEED LOCALIZATION Left 12/02/2019   Procedure: LEFT BREAST LUMPECTOMY WITH RADIOACTIVE SEED LOCALIZATION;  Surgeon: Lindsey Bouillon, MD;  Location: [name];  Service: General;  Laterality: Left;   COMBINED HYSTEROSCOPY DIAGNOSTIC / D&C     CYSTOSCOPY W/ URETEROSCOPY W/ LITHOTRIPSY     dec 2022 and jan 2023,  [facility]   IR ANGIO INTRA EXTRACRAN SEL COM CAROTID INNOMINATE BILAT MOD SED  02/10/2020   IR ANGIO VERTEBRAL SEL VERTEBRAL BILAT MOD SED  02/10/2020   IR US GUIDE VASC ACCESS RIGHT  02/10/2020   LEEP     LITHOTRIPSY     RE-EXCISION OF BREAST LUMPECTOMY Left 12/24/2019   Procedure: RE-EXCISION OF LEFT BREAST LUMPECTOMY;  Surgeon: Lindsey Bouillon, MD;  Location: [name];  Service: General;  Laterality: Left;   RENAL ARTERY STENT Left    WISDOM TOOTH EXTRACTION      FAMILY HISTORY: Family History  Problem Relation Age of Onset   Allergic rhinitis Mother    Food Allergy Mother        peanut   Dementia Father    Alzheimer's disease Father    Asthma Brother    Colon cancer Paternal Grandmother    Breast cancer Cousin    Hyperlipidemia Other    Hypertension Other    Diabetes Other    Crohn's disease Neg Hx    Esophageal cancer Neg Hx    Stomach cancer Neg Hx    Rectal cancer Neg Hx     Angioedema Neg Hx    Eczema Neg Hx    Immunodeficiency Neg Hx    Urticaria Neg Hx   The patient's father died from Alzheimer's disease at age 40.  The patient's mother is 82 years old as of December 2020.  The patient denies a family hx of ovarian cancer. She reports breast cancer in a maternal cousin at age 63 (recurrent stage IV in her late 81s) and in a maternal great aunt. She also notes uterine cancer in her maternal grandmother, bladder cancer in an uncle, and colon cancer in her paternal grandmother.  The patient has 2 sisters, no brothers   GYNECOLOGIC HISTORY:  No LMP recorded. (Menstrual status: Perimenopausal). Menarche: 57 years old Age at first live birth: 57 years old GX P 3 LMP current, irregular, about every 2 months Contraceptive: Barrier methods HRT no Hysterectomy? no BSO?  No   SOCIAL HISTORY: (updated 11/2019)  Anisia is currently working at the [employer].  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  Her husband Rocky Link works at [employer].  The patient has 3 children, Denyse Amass 36 teaches physical education in [place], Casimiro Needle 17 and Bethann Berkshire 15 are at home.  Rocky Link has a child Belenda Cruise from an earlier  marriage.  She is 26     ADVANCED DIRECTIVES: In the absence of any documents to the contrary the patient's husband is her healthcare power of attorney   HEALTH MAINTENANCE: Social History   Tobacco Use   Smoking status: Former    Current packs/day: 0.00    Average packs/day: 1 pack/day for 10.0 years (10.0 ttl pk-yrs)    Types: Cigarettes    Start date: 12/25/1985    Quit date: 12/26/1995    Years since quitting: 27.8   Smokeless tobacco: Never  Vaping Use   Vaping status: Never Used  Substance Use Topics   Alcohol use: Yes    Comment: occasional wine    Drug use: No     Colonoscopy: 12/2018, repeat in 5 years  PAP: Per Dr. Jennette Kettle  Bone density: Remote   Allergies  Allergen Reactions   Azithromycin  Hives   Nitrofurantoin Other (See Comments)    abd pain   Tamiflu [Oseltamivir Phosphate] Hives    Current Outpatient Medications  Medication Sig Dispense Refill   Adalimumab (HUMIRA, 2 PEN,) 40 MG/0.4ML PNKT INJECT 40 MG (1 PEN) UNDER THE SKIN EVERY 14 DAYS 2 each 11   albuterol (VENTOLIN HFA) 108 (90 Base) MCG/ACT inhaler Inhale 1-2 puffs into the lungs every 4 (four) hours as needed for wheezing or shortness of breath. 18 g 3   amLODipine (NORVASC) 5 MG tablet Take 1 tablet (5 mg total) by mouth daily. 90 tablet 1   cephALEXin (KEFLEX) 500 MG capsule Take 1 capsule (500 mg total) by mouth 2 (two) times daily. 10 capsule 0   ciclopirox (PENLAC) 8 % solution Apply topically at bedtime. Apply over nail and surrounding skin. Apply daily over previous coat. After seven (7) days, may remove with alcohol and continue cycle. 6.6 mL 5   desloratadine (CLARINEX) 5 MG tablet Take 5 mg by mouth daily.   4   ezetimibe (ZETIA) 10 MG tablet TAKE 1 TABLET DAILY 90 tablet 1   FLUoxetine (PROZAC) 20 MG capsule TAKE 1 CAPSULE DAILY 90 capsule 3   fluticasone (FLONASE) 50 MCG/ACT nasal spray Place 1 spray into both nostrils daily.     fluticasone (FLOVENT HFA) 110 MCG/ACT inhaler Inhale 2 puffs into the lungs 2 (two) times daily. 1 each 0   indapamide (LOZOL) 1.25 MG tablet Take 1.25 mg by mouth daily.   9   levocetirizine (XYZAL) 5 MG tablet Take 1 tablet (5 mg total) by mouth every evening. 30 tablet 0   metFORMIN (GLUCOPHAGE-XR) 500 MG 24 hr tablet Take 500 mg by mouth 2 (two) times daily.     montelukast (SINGULAIR) 10 MG tablet Take 1 tablet (10 mg total) by mouth at bedtime. 30 tablet 0   OVER THE COUNTER MEDICATION Take 7 drops by mouth 2 (two) times daily. CBD oil     POTASSIUM CITRATE PO Take 108 mg by mouth 2 (two) times daily.     rosuvastatin (CRESTOR) 10 MG tablet Take 1 tablet (10 mg total) by mouth daily. 90 tablet 0   vitamin B-12 (CYANOCOBALAMIN) 1000 MCG tablet Take 1,000 mcg by mouth  daily.     Vitamin D, Ergocalciferol, (DRISDOL) 1.25 MG (50000 UNIT) CAPS capsule TAKE 1 CAPSULE EVERY 7 DAYS 12 capsule 3   No current facility-administered medications for this visit.    OBJECTIVE: White woman who appears well  Vitals:   10/18/23 1555  BP: 135/65  Pulse: 84  Resp: 16  Temp: 97.7 F (36.5 C)  SpO2: 95%     Body mass index is 34.14 kg/m.   Wt Readings from Last 3 Encounters:  10/18/23 198 lb 14.4 oz (90.2 kg)  05/08/23 190 lb (86.2 kg)  04/16/23 189 lb 12.8 oz (86.1 kg)      ECOG FS:1 - Symptomatic but completely ambulatory   Physical Exam Constitutional:      Appearance: Normal appearance.  Chest:     Comments: Bilateral breasts inspected.  No palpable masses or regional adenopathy. Musculoskeletal:        General: No swelling.     Cervical back: Normal range of motion and neck supple. No rigidity.  Lymphadenopathy:     Cervical: No cervical adenopathy.  Neurological:     Mental Status: She is alert.      LAB RESULTS:  CMP     Component Value Date/Time   NA 140 04/11/2023 0000   K 3.8 04/11/2023 0000   CL 100 04/11/2023 0000   CO2 29 (A) 04/11/2023 0000   GLUCOSE 152 (H) 09/18/2022 1541   GLUCOSE 94 12/31/2006 1033   BUN 19 04/11/2023 0000   CREATININE 0.7 04/11/2023 0000   CREATININE 0.81 09/18/2022 1541   CREATININE 0.73 09/06/2021 0909   CREATININE 0.61 10/22/2020 1006   CALCIUM 9.8 04/11/2023 0000   PROT 7.4 09/18/2022 1541   ALBUMIN 4.4 04/11/2023 0000   AST 21 04/11/2023 0000   AST 22 09/06/2021 0909   ALT 22 04/11/2023 0000   ALT 23 09/06/2021 0909   ALKPHOS 66 04/11/2023 0000   BILITOT 0.7 09/18/2022 1541   BILITOT 0.7 09/06/2021 0909   GFRNONAA >60 12/06/2021 1720   GFRNONAA >60 09/06/2021 0909   GFRAA >60 02/05/2020 0917    No results found for: "TOTALPROTELP", "ALBUMINELP", "A1GS", "A2GS", "BETS", "BETA2SER", "GAMS", "MSPIKE", "SPEI"  Lab Results  Component Value Date   WBC 5.8 04/11/2023   NEUTROABS 5.1  09/18/2022   HGB 14.3 04/11/2023   HCT 42 04/11/2023   MCV 87.7 09/18/2022   PLT 288 04/11/2023    No results found for: "LABCA2"  No components found for: "HYQMVH846"  No results for input(s): "INR" in the last 168 hours.  No results found for: "LABCA2"  No results found for: "NGE952"  No results found for: "CAN125"  No results found for: "CAN153"  No results found for: "CA2729"  No components found for: "HGQUANT"  No results found for: "CEA1", "CEA" / No results found for: "CEA1", "CEA"   No results found for: "AFPTUMOR"  No results found for: "CHROMOGRNA"  No results found for: "KPAFRELGTCHN", "LAMBDASER", "KAPLAMBRATIO" (kappa/lambda light chains)  No results found for: "HGBA", "HGBA2QUANT", "HGBFQUANT", "HGBSQUAN" (Hemoglobinopathy evaluation)   No results found for: "LDH"  Lab Results  Component Value Date   IRON 101 09/06/2010   IRONPCTSAT 18.3 (L) 09/06/2010   (Iron and TIBC)  No results found for: "FERRITIN"  Urinalysis    Component Value Date/Time   COLORURINE YELLOW 02/01/2020 1247   APPEARANCEUR CLEAR 02/01/2020 1247   LABSPEC 1.010 02/01/2020 1247   PHURINE 7.5 02/01/2020 1247   GLUCOSEU NEGATIVE 02/01/2020 1247   GLUCOSEU NEGATIVE 05/08/2012 0811   HGBUR SMALL (A) 02/01/2020 1247   HGBUR large 09/19/2010 1245   BILIRUBINUR neg 05/08/2023 1551   KETONESUR NEGATIVE 02/01/2020 1247   PROTEINUR Negative 05/08/2023 1551   PROTEINUR NEGATIVE 02/01/2020 1247   UROBILINOGEN 0.2 05/08/2023 1551   UROBILINOGEN 0.2 01/05/2015 1143   NITRITE neg 05/08/2023 1551   NITRITE NEGATIVE 02/01/2020 1247  LEUKOCYTESUR Negative 05/08/2023 1551   LEUKOCYTESUR NEGATIVE 02/01/2020 1247    STUDIES: No results found.    ELIGIBLE FOR AVAILABLE RESEARCH PROTOCOL: no  ASSESSMENT: 57 y.o. [place] woman status post left lumpectomy 12/02/2019 for ductal carcinoma in situ, grade 2, measuring 0.6 cm, with a positive lateral margin  (a) successful  additional surgery for margin clearance 12/24/2019  (1) adjuvant radiation 01/21/2020 - 02/18/2020  (2) anastrozole prescribed March 2021, never started by patient  (3) genetics testing 11/28/2019 at [name] through the Common Hereditary Cancers Panel test by [facility] showed no deleterious mutations in APC, ATM, AXIN2, BARD1, BMPR1A, BRCA1, BRCA2, BRIP1, CDH1, CDK4, CDKN2A, CHEK2, CTNNA1, DICER1, EPCAM, GREM1, HOXB13, KIT, MEN1, MLH1, MSH2, MSH3, MSH6, MUTYH, NBN, NF1, NTHL1, PALB2, PDGFRA, PMS2, POLD1, POLE, PTEN, RAD50, RAD51C, RAD51D, SDHA, SDHB, SDHC, SDHD, SMAD4, SMARCA4, STK11, TP53, TSC1, TSC2, VHL.   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 Mammogram from September 13, 2023, breast density B, no evidence of malignancy. No concerns on physical exam today.  At this time I have recommended that she continue screening mammograms and return to clinic in 1 year.  Self breast exam recommended, she was to contact us if any new breast changes happened.  She expressed understanding of the recommendations.  Advised regular exercise, healthy diet, good amount of sleep.   Lindsey Moulds, MD   10/18/2023 4:05 PM Medical Oncology and Hematology [name] [address] [place], [place] [address] Tel. [phone]    Fax. [phone]   *Total Encounter Time as defined by the Centers for Medicare and Medicaid Services includes, in addition to the face-to-face time of a patient visit (documented in the note above) non-face-to-face time: obtaining and reviewing outside history, ordering and reviewing medications, tests or procedures, care coordination (communications with other health care professionals or caregivers) and  documentation in the medical record.

## 2023-12-10 ENCOUNTER — Other Ambulatory Visit: Payer: Self-pay | Admitting: Family Medicine

## 2023-12-16 ENCOUNTER — Ambulatory Visit
Admission: RE | Admit: 2023-12-16 | Discharge: 2023-12-16 | Disposition: A | Discharge status: Home / Self Care | Payer: Commercial Managed Care - PPO | Source: Ambulatory Visit | Attending: Family Medicine | Admitting: Family Medicine

## 2023-12-16 VITALS — BP 126/82 | HR 76 | Temp 97.7°F | Resp 17 | Ht 64.0 in | Wt 195.0 lb

## 2023-12-16 DIAGNOSIS — J209 Acute bronchitis, unspecified: Secondary | ICD-10-CM | POA: Diagnosis not present

## 2023-12-16 DIAGNOSIS — J4521 Mild intermittent asthma with (acute) exacerbation: Secondary | ICD-10-CM | POA: Diagnosis not present

## 2023-12-16 MED ORDER — PREDNISONE 20 MG PO TABS: 40.0000 mg | ORAL_TABLET | Freq: Every day | ORAL | 0 refills | Status: DC

## 2023-12-16 NOTE — ED Provider Notes (Signed)
[facility]    CSN: [id number] Arrival date & time: 12/16/23  1151      History   Chief Complaint Chief Complaint  Patient presents with   Cough    Coughing since last sunday.  Do not seem to be getting better. - Entered by patient    HPI Lindsey [name] is a 57 y.o. female.   Patient is here for a cough.  She has had a cough for 7 days.  She had a virtual urgent care visit was given amoxicillin.  She does have underlying asthma.  She has Flonase and Flovent.  She also uses albuterol as needed.  She has had increased need of her albuterol.  She states she feels she needs to take prednisone in addition.  She declines any stronger cough medication.    Past Medical History:  Diagnosis Date   Allergy    Anxiety    Breast cancer (HCC)    Cancer (HCC)    Colitis    Crohn's disease (HCC)    Depression    Diverticulosis    Hyperlipidemia    Hypertension    Mild intermittent asthma    rarely uses inhaler   Nephrolithiasis    Personal history of radiation therapy    PONV (postoperative nausea and vomiting)    Stroke [facility])     Patient Active Problem List   Diagnosis Date Noted   Urinary frequency 05/08/2023   Preventative health care 04/25/2022   Morbid obesity (HCC) 04/25/2022   Hyperglycemia 10/25/2021   Cerebral embolism with cerebral infarction 02/02/2020   Acute encephalopathy 02/02/2020   TIA (transient ischemic attack) 02/02/2020   Acute confusion 02/01/2020   Malignant neoplasm of upper-outer quadrant of left breast in female, estrogen receptor positive (HCC) 10/29/2019   Hyperlipidemia 03/13/2019   High risk medication use 03/13/2019   Crohn's disease without complication (HCC) 12/11/2017   Mild persistent asthma without complication 10/16/2017   Crohn's colitis (HCC) 02/07/2017   Mild intermittent asthma 08/23/2016   Chronic rhinitis 08/23/2016   Acute cystitis without hematuria 05/05/2015   Nephrolithiasis 05/05/2015   Paronychia  09/21/2014   Calcium nephrolithiasis 06/03/2014   Obesity (BMI 30-39.9) 05/14/2014   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   Idiopathic urticaria 02/23/2012   TINEA PEDIS 11/24/2010   Dysuria 09/19/2010   History of allergy 09/19/2010   Backache 04/01/2010   PAIN IN THORACIC SPINE 12/13/2009   B12 deficiency 03/09/2009   SINUSITIS - ACUTE-NOS 03/09/2009   NEPHROLITHIASIS, HX OF 07/29/2008   BACK STRAIN, LUMBAR 02/18/2008   Hyperlipidemia LDL goal <100 11/05/2007   Anxiety state 08/15/2007   BRONCHITIS, ACUTE 08/15/2007   FATIGUE 08/15/2007   PALPITATIONS, OCCASIONAL 08/15/2007   Primary hypertension 05/15/2007    Past Surgical History:  Procedure Laterality Date   BREAST BIOPSY Left 10/17/2019   BREAST LUMPECTOMY Left 12/02/2019   BREAST LUMPECTOMY WITH RADIOACTIVE SEED LOCALIZATION Left 12/02/2019   Procedure: LEFT BREAST LUMPECTOMY WITH RADIOACTIVE SEED LOCALIZATION;  Surgeon: Harriette Bouillon, MD;  Location: [facility];  Service: General;  Laterality: Left;   COMBINED HYSTEROSCOPY DIAGNOSTIC / D&C     CYSTOSCOPY W/ URETEROSCOPY W/ LITHOTRIPSY     dec 2022 and jan 2023,  [facility]   IR ANGIO INTRA EXTRACRAN SEL COM CAROTID INNOMINATE BILAT MOD SED  02/10/2020   IR ANGIO VERTEBRAL SEL VERTEBRAL BILAT MOD SED  02/10/2020   IR US GUIDE VASC ACCESS RIGHT  02/10/2020   LEEP     LITHOTRIPSY  RE-EXCISION OF BREAST LUMPECTOMY Left 12/24/2019   Procedure: RE-EXCISION OF LEFT BREAST LUMPECTOMY;  Surgeon: Harriette Bouillon, MD;  Location: [facility];  Service: General;  Laterality: Left;   RENAL ARTERY STENT Left    WISDOM TOOTH EXTRACTION      OB History   No obstetric history on file.      Home Medications    Prior to Admission medications   Medication Sig Start Date End Date Taking? Authorizing Provider  Adalimumab (HUMIRA, 2 PEN,) 40 MG/0.4ML PNKT INJECT 40 MG (1 PEN) UNDER THE SKIN EVERY 14 DAYS 01/18/23  Yes Pyrtle, Carie Caddy, MD  albuterol (VENTOLIN HFA)  108 (90 Base) MCG/ACT inhaler Inhale 1-2 puffs into the lungs every 4 (four) hours as needed for wheezing or shortness of breath. 03/03/21  Yes Copland, Gwenlyn Found, MD  amLODipine (NORVASC) 5 MG tablet TAKE 1 TABLET DAILY 12/10/23  Yes Seabron Spates R, DO  cephALEXin (KEFLEX) 500 MG capsule Take 1 capsule (500 mg total) by mouth 2 (two) times daily. 05/08/23  Yes Sandford Craze, NP  ciclopirox (PENLAC) 8 % solution Apply topically at bedtime. Apply over nail and surrounding skin. Apply daily over previous coat. After seven (7) days, may remove with alcohol and continue cycle. 04/25/22  Yes Seabron Spates R, DO  desloratadine (CLARINEX) 5 MG tablet Take 5 mg by mouth daily.  08/29/18  Yes [provider]  ezetimibe (ZETIA) 10 MG tablet TAKE 1 TABLET DAILY 07/02/23  Yes Zola Button, Myrene Buddy R, DO  FLUoxetine (PROZAC) 20 MG capsule TAKE 1 CAPSULE DAILY 08/22/23  Yes Zola Button, Xavior Niazi R, DO  fluticasone (FLONASE) 50 MCG/ACT nasal spray Place 1 spray into both nostrils daily.   Yes [provider]  fluticasone (FLOVENT HFA) 110 MCG/ACT inhaler Inhale 2 puffs into the lungs 2 (two) times daily. 03/03/21  Yes Copland, Gwenlyn Found, MD  indapamide (LOZOL) 1.25 MG tablet Take 1.25 mg by mouth daily.  11/22/17  Yes [provider]  levocetirizine (XYZAL) 5 MG tablet Take 1 tablet (5 mg total) by mouth every evening. 06/04/18  Yes Bardelas, Jose A, MD  metFORMIN (GLUCOPHAGE-XR) 500 MG 24 hr tablet Take 500 mg by mouth 2 (two) times daily. 03/22/23  Yes [provider]  montelukast (SINGULAIR) 10 MG tablet Take 1 tablet (10 mg total) by mouth at bedtime. 06/04/18  Yes Bardelas, Bonnita Hollow, MD  OVER THE COUNTER MEDICATION Take 7 drops by mouth 2 (two) times daily. CBD oil   Yes [provider]  POTASSIUM CITRATE PO Take 108 mg by mouth 2 (two) times daily.   Yes [provider]  predniSONE (DELTASONE) 20 MG tablet Take 2 tablets (40 mg total) by mouth daily with  breakfast. 12/16/23  Yes Eustace Moore, MD  rosuvastatin (CRESTOR) 10 MG tablet Take 1 tablet (10 mg total) by mouth daily. 09/06/23  Yes Seabron Spates R, DO  vitamin B-12 (CYANOCOBALAMIN) 1000 MCG tablet Take 1,000 mcg by mouth daily.   Yes [provider]  Vitamin D, Ergocalciferol, (DRISDOL) 1.25 MG (50000 UNIT) CAPS capsule TAKE 1 CAPSULE EVERY 7 DAYS 02/05/23  Yes Donato Schultz, DO    Family History Family History  Problem Relation Age of Onset   Allergic rhinitis Mother    Food Allergy Mother        peanut   Dementia Father    Alzheimer's disease Father    Asthma Brother    Colon cancer Paternal Grandmother  Breast cancer Cousin    Hyperlipidemia Other    Hypertension Other    Diabetes Other    Crohn's disease Neg Hx    Esophageal cancer Neg Hx    Stomach cancer Neg Hx    Rectal cancer Neg Hx    Angioedema Neg Hx    Eczema Neg Hx    Immunodeficiency Neg Hx    Urticaria Neg Hx     Social History Social History   Tobacco Use   Smoking status: Former    Current packs/day: 0.00    Average packs/day: 1 pack/day for 10.0 years (10.0 ttl pk-yrs)    Types: Cigarettes    Start date: 12/25/1985    Quit date: 12/26/1995    Years since quitting: 27.9   Smokeless tobacco: Never  Vaping Use   Vaping status: Never Used  Substance Use Topics   Alcohol use: Not Currently    Comment: occasional wine    Drug use: No     Allergies   Azithromycin, Nitrofurantoin, and Tamiflu [oseltamivir phosphate]   Review of Systems Review of Systems  See HPI Physical Exam Triage Vital Signs ED Triage Vitals  Encounter Vitals Group     BP 12/16/23 1220 126/82     Systolic BP Percentile --      Diastolic BP Percentile --      Pulse Rate 12/16/23 1220 76     Resp 12/16/23 1220 17     Temp 12/16/23 1220 97.7 F (36.5 C)     Temp Source 12/16/23 1220 Oral     SpO2 12/16/23 1220 97 %     Weight 12/16/23 1218 195 lb (88.5 kg)     Height 12/16/23 1218 5\' 4"   (1.626 m)     Head Circumference --      Peak Flow --      Pain Score 12/16/23 1218 0     Pain Loc --      Pain Education --      Exclude from Growth Chart --    No data found.  Updated Vital Signs BP 126/82 (BP Location: Right Arm)   Pulse 76   Temp 97.7 F (36.5 C) (Oral)   Resp 17   Ht 5\' 4"  (1.626 m)   Wt 88.5 kg   SpO2 97%   BMI 33.47 kg/m     Physical Exam Constitutional:      General: She is not in acute distress.    Appearance: She is well-developed and normal weight.  HENT:     Head: Normocephalic and atraumatic.     Right Ear: Tympanic membrane normal.     Left Ear: Tympanic membrane normal.     Nose: Nose normal. No congestion.     Mouth/Throat:     Pharynx: Posterior oropharyngeal erythema present.  Eyes:     Conjunctiva/sclera: Conjunctivae normal.     Pupils: Pupils are equal, round, and reactive to light.  Cardiovascular:     Rate and Rhythm: Normal rate and regular rhythm.     Heart sounds: Normal heart sounds.  Pulmonary:     Effort: Pulmonary effort is normal. No respiratory distress.     Breath sounds: Normal breath sounds. No wheezing or rhonchi.  Abdominal:     General: There is no distension.     Palpations: Abdomen is soft.  Musculoskeletal:        General: Normal range of motion.     Cervical back: Normal range of motion.  Lymphadenopathy:  Cervical: No cervical adenopathy.  Skin:    General: Skin is warm and dry.  Neurological:     Mental Status: She is alert.      UC Treatments / Results  Labs (all labs ordered are listed, but only abnormal results are displayed) Labs Reviewed - No data to display  EKG   Radiology No results found.  Procedures Procedures (including critical care time)  Medications Ordered in UC Medications - No data to display  Initial Impression / Assessment and Plan / UC Course  I have reviewed the triage vital signs and the nursing notes.  Pertinent labs & imaging results that were available  during my care of the patient were reviewed by me and considered in my medical decision making (see chart for details).     Final Clinical Impressions(s) / UC Diagnoses   Final diagnoses:  Acute bronchitis, unspecified organism  Mild intermittent asthma with exacerbation     Discharge Instructions      Continue albuterol inhaler as needed.  Continue Flovent and Flonase daily Make sure you are drinking lots of water Take the amoxicillin as prescribed I have prescribed prednisone to take in addition Use over-the-counter cough and cold medicines as needed See your doctor if not improving by next week   ED Prescriptions     Medication Sig Dispense Auth. Provider   predniSONE (DELTASONE) 20 MG tablet Take 2 tablets (40 mg total) by mouth daily with breakfast. 10 tablet Eustace Moore, MD      PDMP not reviewed this encounter.   Eustace Moore, MD 12/16/23 1245

## 2023-12-16 NOTE — ED Triage Notes (Signed)
Pt states that she has a cough and loss of voice x1 week  Pt states that she also has chest congestion.

## 2023-12-16 NOTE — Discharge Instructions (Signed)
Continue albuterol inhaler as needed.  Continue Flovent and Flonase daily Make sure you are drinking lots of water Take the amoxicillin as prescribed I have prescribed prednisone to take in addition Use over-the-counter cough and cold medicines as needed See your doctor if not improving by next week

## 2023-12-31 ENCOUNTER — Other Ambulatory Visit: Payer: Self-pay | Admitting: Family Medicine

## 2023-12-31 DIAGNOSIS — E785 Hyperlipidemia, unspecified: Secondary | ICD-10-CM

## 2024-01-03 ENCOUNTER — Ambulatory Visit: Payer: Commercial Managed Care - PPO | Admitting: Family Medicine

## 2024-01-03 ENCOUNTER — Encounter: Payer: Self-pay | Admitting: Family Medicine

## 2024-01-03 VITALS — BP 130/82 | HR 103 | Temp 97.9°F | Resp 18 | Ht 64.0 in | Wt 197.0 lb

## 2024-01-03 DIAGNOSIS — E785 Hyperlipidemia, unspecified: Secondary | ICD-10-CM

## 2024-01-03 DIAGNOSIS — R7303 Prediabetes: Secondary | ICD-10-CM | POA: Diagnosis not present

## 2024-01-03 DIAGNOSIS — N200 Calculus of kidney: Secondary | ICD-10-CM | POA: Diagnosis not present

## 2024-01-03 DIAGNOSIS — I10 Essential (primary) hypertension: Secondary | ICD-10-CM

## 2024-01-03 MED ORDER — AMLODIPINE BESYLATE 5 MG PO TABS
5.0000 mg | ORAL_TABLET | Freq: Every day | ORAL | 1 refills | Status: DC
Start: 2024-01-03 — End: 2024-08-18

## 2024-01-03 MED ORDER — INDAPAMIDE 1.25 MG PO TABS: 1.2500 mg | ORAL_TABLET | Freq: Every day | ORAL | 3 refills | Status: AC

## 2024-01-03 NOTE — Patient Instructions (Signed)

## 2024-01-03 NOTE — Progress Notes (Signed)
 Established Patient Office Visit  Subjective   Patient ID: Lindsey [name], female    DOB: May 09, 1966  Age: 58 y.o. MRN: [id number]  Chief Complaint  Patient presents with   Hypertension   Follow-up    HPI   Discussed the use of AI scribe software for clinical note transcription with the patient, who gave verbal consent to proceed.  History of Present Illness   The patient presents for a routine follow-up visit, primarily for blood pressure management. She reports a recent increase in blood pressure, attributing it to stress related to personal issues. She also mentions having kidney issues but does not elaborate on the specifics.  The patient is currently on multiple medications, including Crestor , which she reports causes her to feel achy. She has been taking it daily, even after cutting the pills in half. She also mentions being on Vitamin D , Humira , Albuterol , Amlodipine , and Prozac .  In addition to the above, the patient has been diagnosed with prediabetes and is on Metformin as part of a program through her work. She also uses a Dexcom device for monitoring her blood sugar levels. However, she admits to not being very consistent with it recently.  The patient also reports a persistent cough that has been present since she had bronchitis during the Christmas period. Upon examination, a quiet murmur is noted, which the patient seems to have been aware of previously.  Lastly, the patient mentions having a son who no longer communicates with her. She expresses feelings of regret about her past as a teen mom and the impact it may have had on her relationship with her son.       Patient Active Problem List   Diagnosis Date Noted   Urinary frequency 05/08/2023   Preventative health care 04/25/2022   Morbid obesity (HCC) 04/25/2022   Hyperglycemia 10/25/2021   Cerebral embolism with cerebral infarction 02/02/2020   Acute encephalopathy 02/02/2020   TIA (transient ischemic  attack) 02/02/2020   Acute confusion 02/01/2020   Malignant neoplasm of upper-outer quadrant of left breast in female, estrogen receptor positive (HCC) 10/29/2019   Hyperlipidemia 03/13/2019   High risk medication use 03/13/2019   Crohn's disease without complication (HCC) 12/11/2017   Mild persistent asthma without complication 10/16/2017   Crohn's colitis (HCC) 02/07/2017   Mild intermittent asthma 08/23/2016   Chronic rhinitis 08/23/2016   Acute cystitis without hematuria 05/05/2015   Nephrolithiasis 05/05/2015   Paronychia 09/21/2014   Calcium  nephrolithiasis 06/03/2014   Obesity (BMI 30-39.9) 05/14/2014   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   Idiopathic urticaria 02/23/2012   TINEA PEDIS 11/24/2010   Dysuria 09/19/2010   History of allergy 09/19/2010   Backache 04/01/2010   PAIN IN THORACIC SPINE 12/13/2009   B12 deficiency 03/09/2009   SINUSITIS - ACUTE-NOS 03/09/2009   NEPHROLITHIASIS, HX OF 07/29/2008   BACK STRAIN, LUMBAR 02/18/2008   Hyperlipidemia LDL goal <100 11/05/2007   Anxiety state 08/15/2007   BRONCHITIS, ACUTE 08/15/2007   FATIGUE 08/15/2007   PALPITATIONS, OCCASIONAL 08/15/2007   Primary hypertension 05/15/2007   Past Medical History:  Diagnosis Date   Allergy    Anxiety    Breast cancer (HCC)    Cancer (HCC)    Colitis    Crohn's disease (HCC)    Depression    Diverticulosis    Hyperlipidemia    Hypertension    Mild intermittent asthma    rarely uses inhaler   Nephrolithiasis    Personal history of radiation therapy    PONV (postoperative nausea  and vomiting)    Stroke [facility])    Past Surgical History:  Procedure Laterality Date   BREAST BIOPSY Left 10/17/2019   BREAST LUMPECTOMY Left 12/02/2019   BREAST LUMPECTOMY WITH RADIOACTIVE SEED LOCALIZATION Left 12/02/2019   Procedure: LEFT BREAST LUMPECTOMY WITH RADIOACTIVE SEED LOCALIZATION;  Surgeon: Vanderbilt Ned, MD;  Location: [facility];  Service: General;  Laterality: Left;    COMBINED HYSTEROSCOPY DIAGNOSTIC / D&C     CYSTOSCOPY W/ URETEROSCOPY W/ LITHOTRIPSY     dec 2022 and jan 2023,  [facility]   IR ANGIO INTRA EXTRACRAN SEL COM CAROTID INNOMINATE BILAT MOD SED  02/10/2020   IR ANGIO VERTEBRAL SEL VERTEBRAL BILAT MOD SED  02/10/2020   IR US  GUIDE VASC ACCESS RIGHT  02/10/2020   LEEP     LITHOTRIPSY     RE-EXCISION OF BREAST LUMPECTOMY Left 12/24/2019   Procedure: RE-EXCISION OF LEFT BREAST LUMPECTOMY;  Surgeon: Vanderbilt Ned, MD;  Location: [facility];  Service: General;  Laterality: Left;   RENAL ARTERY STENT Left    WISDOM TOOTH EXTRACTION     Social History   Tobacco Use   Smoking status: Former    Current packs/day: 0.00    Average packs/day: 1 pack/day for 10.0 years (10.0 ttl pk-yrs)    Types: Cigarettes    Start date: 12/25/1985    Quit date: 12/26/1995    Years since quitting: 28.0   Smokeless tobacco: Never  Vaping Use   Vaping status: Never Used  Substance Use Topics   Alcohol use: Not Currently    Comment: occasional wine    Drug use: No   Social History   Socioeconomic History   Marital status: Married    Spouse name: Not on file   Number of children: Not on file   Years of education: Not on file   Highest education level: Not on file  Occupational History   Not on file  Tobacco Use   Smoking status: Former    Current packs/day: 0.00    Average packs/day: 1 pack/day for 10.0 years (10.0 ttl pk-yrs)    Types: Cigarettes    Start date: 12/25/1985    Quit date: 12/26/1995    Years since quitting: 28.0   Smokeless tobacco: Never  Vaping Use   Vaping status: Never Used  Substance and Sexual Activity   Alcohol use: Not Currently    Comment: occasional wine    Drug use: No   Sexual activity: Yes  Other Topics Concern   Not on file  Social History Narrative   Occupation: [employer]   Married   Regular exercise- yes   Social Drivers of Corporate Investment Banker Strain: Low Risk  (12/31/2023)   Received from [facility]   Overall Financial Resource Strain (CARDIA)    Difficulty of Paying Living Expenses: Not hard at all  Food Insecurity: No Food Insecurity (12/31/2023)   [facility]   Hunger Vital Sign    Worried About Running Out of Food in the Last Year: Never true    Ran Out of Food in the Last Year: Never true  Transportation Needs: No Transportation Needs (12/31/2023)   Received from [facility] - Transportation    Lack of Transportation (Medical): No    Lack of Transportation (Non-Medical): No  Physical Activity: Not on file  Stress: Not on file  Social Connections: Unknown (05/08/2022)   [facility]   Social Network  Social Network: Not on file  Intimate Partner Violence: Unknown (03/30/2022)   Received from [facility]   HITS    Physically Hurt: Not on file    Insult or Talk Down To: Not on file    Threaten Physical Harm: Not on file    Scream or Curse: Not on file   Family Status  Relation Name Status   Mother  Alive   Father  Deceased   Sister  Alive   Brother  (Not Specified)   PGM  (Not Specified)   Cousin  (Not Specified)   Other  (Not Specified)   Other  (Not Specified)   Other  (Not Specified)   Neg Hx  (Not Specified)  No partnership data on file   Family History  Problem Relation Age of Onset   Allergic rhinitis Mother    Food Allergy Mother        peanut   Dementia Father    Alzheimer's disease Father    Asthma Brother    Colon cancer Paternal Grandmother    Breast cancer Cousin    Hyperlipidemia Other    Hypertension Other    Diabetes Other    Crohn's disease Neg Hx    Esophageal cancer Neg Hx    Stomach cancer Neg Hx    Rectal cancer Neg Hx    Angioedema Neg Hx    Eczema Neg Hx    Immunodeficiency Neg Hx    Urticaria Neg Hx    Allergies  Allergen Reactions   Azithromycin  Hives   Nitrofurantoin Other (See Comments)    abd pain   Tamiflu [Oseltamivir Phosphate] Hives       ROS    Objective:     BP 130/82   Pulse (!) 103   Temp 97.9 F (36.6 C) (Oral)   Resp 18   Ht 5' 4 (1.626 m)   Wt 197 lb (89.4 kg)   SpO2 98%   BMI 33.81 kg/m  BP Readings from Last 3 Encounters:  01/03/24 130/82  12/16/23 126/82  10/18/23 135/65   Wt Readings from Last 3 Encounters:  01/03/24 197 lb (89.4 kg)  12/16/23 195 lb (88.5 kg)  10/18/23 198 lb 14.4 oz (90.2 kg)   SpO2 Readings from Last 3 Encounters:  01/03/24 98%  12/16/23 97%  10/18/23 95%      Physical Exam Vitals and nursing note reviewed.  Constitutional:      General: She is not in acute distress.    Appearance: Normal appearance. She is well-developed.  HENT:     Head: Normocephalic and atraumatic.  Eyes:     General: No scleral icterus.       Right eye: No discharge.        Left eye: No discharge.  Cardiovascular:     Rate and Rhythm: Normal rate and regular rhythm.     Heart sounds: Murmur heard.  Pulmonary:     Effort: Pulmonary effort is normal. No respiratory distress.     Breath sounds: Normal breath sounds.  Musculoskeletal:        General: Normal range of motion.     Cervical back: Normal range of motion and neck supple.     Right lower leg: No edema.     Left lower leg: No edema.  Skin:    General: Skin is warm and dry.  Neurological:     Mental Status: She is alert and oriented to person, place, and time.  Psychiatric:  Mood and Affect: Mood normal.        Behavior: Behavior normal.        Thought Content: Thought content normal.        Judgment: Judgment normal.      No results found for any visits on 01/03/24.  Last CBC Lab Results  Component Value Date   WBC 5.8 04/11/2023   HGB 14.3 04/11/2023   HCT 42 04/11/2023   MCV 87.7 09/18/2022   MCH 29.8 12/06/2021   RDW 13.7 09/18/2022   PLT 288 04/11/2023   Last metabolic panel Lab Results  Component Value Date   GLUCOSE 152 (H) 09/18/2022   NA 140 04/11/2023   K 3.8 04/11/2023   CL 100  04/11/2023   CO2 29 (A) 04/11/2023   BUN 19 04/11/2023   CREATININE 0.7 04/11/2023   EGFR 96 04/11/2023   CALCIUM  9.8 04/11/2023   PROT 7.4 09/18/2022   ALBUMIN 4.4 04/11/2023   BILITOT 0.7 09/18/2022   ALKPHOS 66 04/11/2023   AST 21 04/11/2023   ALT 22 04/11/2023   ANIONGAP 12 12/06/2021   Last lipids Lab Results  Component Value Date   CHOL 200 04/11/2023   HDL 64 04/11/2023   LDLCALC 118 04/11/2023   LDLDIRECT 123.2 12/20/2011   TRIG 29 (A) 04/11/2023   CHOLHDL 2 09/18/2022   Last hemoglobin A1c Lab Results  Component Value Date   HGBA1C 6.2 10/11/2022   Last thyroid  functions Lab Results  Component Value Date   TSH 2.31 10/11/2022   Last vitamin D  Lab Results  Component Value Date   VD25OH 62.90 09/18/2022   Last vitamin B12 and Folate Lab Results  Component Value Date   VITAMINB12 961 (H) 09/18/2022   FOLATE 9.9 09/06/2010      The ASCVD Risk score (Arnett DK, et al., 2019) failed to calculate for the following reasons:   Risk score cannot be calculated because patient has a medical history suggesting prior/existing ASCVD    Assessment & Plan:   Problem List Items Addressed This Visit       Unprioritized   Primary hypertension - Primary   Relevant Medications   indapamide  (LOZOL ) 1.25 MG tablet   amLODipine  (NORVASC ) 5 MG tablet   indapamide  (LOZOL ) 1.25 MG tablet   Other Relevant Orders   CBC with Differential/Platelet   Comprehensive metabolic panel   Lipid panel   Hyperlipidemia   Encourage heart healthy diet such as MIND or DASH diet, increase exercise, avoid trans fats, simple carbohydrates and processed foods, consider a krill or fish or flaxseed oil cap daily.   Pt having myalgias from crestor  ----  dec to 10 mg 3x a week      Relevant Medications   indapamide  (LOZOL ) 1.25 MG tablet   amLODipine  (NORVASC ) 5 MG tablet   indapamide  (LOZOL ) 1.25 MG tablet   Other Visit Diagnoses       Kidney stones       Relevant Medications    indapamide  (LOZOL ) 1.25 MG tablet   indapamide  (LOZOL ) 1.25 MG tablet     Prediabetes       Relevant Orders   CBC with Differential/Platelet   Comprehensive metabolic panel   Hemoglobin A1c   Lipid panel   TSH     Assessment and Plan    Hypertension   Her blood pressure is elevated, likely due to recent stress, and she is currently on amlodipine . We will recheck her blood pressure after a few minutes and continue amlodipine .  Hyperlipidemia   She discontinued Crestor  due to muscle aches. We discussed alternative dosing strategies and the potential benefit of a CoQ10 supplement. We explained that Crestor 's long half-life allows for flexible dosing. She will take Crestor  10 mg three times a week and consider a CoQ10 supplement if muscle aches persist.  Prediabetes   She is on metformin and part of a work program but has been inconsistent in using her Dexcom monitor. Her last labs from the fall were not received. We will order labs to check her current status and encourage consistent use of the Dexcom monitor.  Bronchitis   She has a recurring cough post-bronchitis since Christmas with no current treatment plan.  Heart Murmur   A quiet heart murmur was detected during the exam, previously noted, with no immediate concern.  General Health Maintenance   She received flu and COVID vaccines but is uncertain about her shingles vaccine status. We discussed the importance of verifying vaccination status, especially with Humira  use. We will verify her shingles vaccine status with the pharmacy.  Follow-up   We will recheck her blood pressure after a few minutes, order labs for prediabetes, and verify her shingles vaccine status with the pharmacy.        Return in about 6 months (around 07/02/2024), or if symptoms worsen or fail to improve.    Caterina Racine R Lowne Chase, DO

## 2024-01-03 NOTE — Assessment & Plan Note (Signed)
 Encourage heart healthy diet such as MIND or DASH diet, increase exercise, avoid trans fats, simple carbohydrates and processed foods, consider a krill or fish or flaxseed oil cap daily.   Pt having myalgias from crestor ----  dec to 10 mg 3x a week

## 2024-01-07 ENCOUNTER — Other Ambulatory Visit (INDEPENDENT_AMBULATORY_CARE_PROVIDER_SITE_OTHER): Payer: Commercial Managed Care - PPO

## 2024-01-07 DIAGNOSIS — I10 Essential (primary) hypertension: Secondary | ICD-10-CM | POA: Diagnosis not present

## 2024-01-07 DIAGNOSIS — R7303 Prediabetes: Secondary | ICD-10-CM

## 2024-01-07 LAB — LIPID PANEL
Cholesterol: 214 mg/dL — ABNORMAL HIGH (ref 0–200)
HDL: 60 mg/dL (ref >39.00)
LDL Cholesterol: 127 mg/dL — ABNORMAL HIGH (ref 0–99)
NonHDL: 153.83
Total CHOL/HDL Ratio: 4
Triglycerides: 133 mg/dL (ref 0.0–149.0)
VLDL: 26.6 mg/dL (ref 0.0–40.0)

## 2024-01-07 LAB — COMPREHENSIVE METABOLIC PANEL
ALT: 20 U/L (ref 0–35)
AST: 18 U/L (ref 0–37)
Albumin: 4.3 g/dL (ref 3.5–5.2)
Alkaline Phosphatase: 72 U/L (ref 39–117)
BUN: 16 mg/dL (ref 6–23)
CO2: 29 meq/L (ref 19–32)
Calcium: 9.7 mg/dL (ref 8.4–10.5)
Chloride: 100 meq/L (ref 96–112)
Creatinine, Ser: 0.7 mg/dL (ref 0.40–1.20)
GFR: 96.1 mL/min (ref >60.00)
Glucose, Bld: 126 mg/dL — ABNORMAL HIGH (ref 70–99)
Potassium: 3.7 meq/L (ref 3.5–5.1)
Sodium: 139 meq/L (ref 135–145)
Total Bilirubin: 0.7 mg/dL (ref 0.2–1.2)
Total Protein: 6.8 g/dL (ref 6.0–8.3)

## 2024-01-07 LAB — CBC WITH DIFFERENTIAL/PLATELET
Basophils Absolute: 0.1 10*3/uL (ref 0.0–0.1)
Basophils Relative: 1 % (ref 0.0–3.0)
Eosinophils Absolute: 0.4 10*3/uL (ref 0.0–0.7)
Eosinophils Relative: 5.7 % — ABNORMAL HIGH (ref 0.0–5.0)
HCT: 44.2 % (ref 36.0–46.0)
Hemoglobin: 14.8 g/dL (ref 12.0–15.0)
Lymphocytes Relative: 32.1 % (ref 12.0–46.0)
Lymphs Abs: 2 10*3/uL (ref 0.7–4.0)
MCHC: 33.5 g/dL (ref 30.0–36.0)
MCV: 89.2 fL (ref 78.0–100.0)
Monocytes Absolute: 0.5 10*3/uL (ref 0.1–1.0)
Monocytes Relative: 8.2 % (ref 3.0–12.0)
Neutro Abs: 3.3 10*3/uL (ref 1.4–7.7)
Neutrophils Relative %: 53 % (ref 43.0–77.0)
Platelets: 301 10*3/uL (ref 150.0–400.0)
RBC: 4.95 Mil/uL (ref 3.87–5.11)
RDW: 13.7 % (ref 11.5–15.5)
WBC: 6.2 10*3/uL (ref 4.0–10.5)

## 2024-01-07 LAB — HEMOGLOBIN A1C: Hgb A1c MFr Bld: 6.5 % (ref 4.6–6.5)

## 2024-01-07 LAB — TSH: TSH: 3.02 u[IU]/mL (ref 0.35–5.50)

## 2024-01-13 ENCOUNTER — Other Ambulatory Visit: Payer: Self-pay | Admitting: Family Medicine

## 2024-01-13 ENCOUNTER — Encounter: Payer: Self-pay | Admitting: Family Medicine

## 2024-01-13 DIAGNOSIS — E118 Type 2 diabetes mellitus with unspecified complications: Secondary | ICD-10-CM

## 2024-01-13 DIAGNOSIS — E1169 Type 2 diabetes mellitus with other specified complication: Secondary | ICD-10-CM

## 2024-01-13 DIAGNOSIS — I10 Essential (primary) hypertension: Secondary | ICD-10-CM

## 2024-01-13 DIAGNOSIS — E785 Hyperlipidemia, unspecified: Secondary | ICD-10-CM

## 2024-01-14 ENCOUNTER — Other Ambulatory Visit: Payer: Self-pay

## 2024-01-14 DIAGNOSIS — E785 Hyperlipidemia, unspecified: Secondary | ICD-10-CM

## 2024-01-14 MED ORDER — ROSUVASTATIN CALCIUM 10 MG PO TABS: 20.0000 mg | ORAL_TABLET | Freq: Every day | ORAL | 0 refills | Status: AC

## 2024-01-16 ENCOUNTER — Encounter: Payer: Self-pay | Admitting: Gastroenterology

## 2024-01-16 ENCOUNTER — Ambulatory Visit: Payer: Commercial Managed Care - PPO | Admitting: Gastroenterology

## 2024-01-16 VITALS — BP 100/62 | HR 72 | Ht 64.0 in | Wt 200.2 lb

## 2024-01-16 DIAGNOSIS — K5010 Crohn's disease of large intestine without complications: Secondary | ICD-10-CM | POA: Diagnosis not present

## 2024-01-16 NOTE — Progress Notes (Signed)
01/16/2024 Lindsey [name] [id number] 08-09-1966   HISTORY OF PRESENT ILLNESS:  This is a 58 year old female who is a patient of Dr. Lauro Franklin with Crohn's colitis diagnosed in 2017 maintained on Humira every 2 weeks and previously mesalamine 4.8 g daily as well but is now off of that, breast cancer in remission, hypertension, hyperlipidemia, diabetes, obesity, kidney stones who is here for follow-up. She is here alone today was last seen in 11/2022 when she was reporting a flare of symptoms.  They discussed possibly having to switch her medication to [employer].  Her fecal calprotectin was normal and her Humira drug levels and antibodies were fine.  Colonoscopy 12/2022 normal as below.  They decided to continue her current regimen.  She is now here for regular follow-up.  She tells me that for the past few months on and off she has been having some flare of symptoms.  Is having some left-sided abdominal pain and did have a lithotripsy recently for kidney stone, but she says that this pain feels different than that, feels more intestinal like it is related to her Crohn's.  She has also had some altered bowel habits with change in the consistency of her stool, not as firm and formed.  Usually about 2 bowel movements a day, but depending on what she eats could have somewhere around 5 or 6 bowel movements a day at times.  No rectal bleeding.  12/2022:  - The examined portion of the ileum was normal. - Simple Endoscopic Score for Crohn' s Disease: 0, mucosal inflammatory changes secondary to Crohn' s disease, in remission. - Mild diverticulosis in the sigmoid colon and in the descending colon. - Anal papilla( e) were hypertrophied. - Multiple biopsies were obtained in the entire colon ( cecum/ acending; transverse; descending/ sigmoid; rectum) .  1. Surgical [P], colon, ascending and cecum - BENIGN COLONIC MUCOSA WITHOUT DIAGNOSTIC ABNORMALITY - NEGATIVE FOR SIGNIFICANT CHRONIC CHANGES, ACTIVE  INFLAMMATION, GRANULOMATA, DYSPLASIA OR MALIGNANCY 2. Surgical [P], colon, transverse - BENIGN COLONIC MUCOSA WITH LYMPHOID AGGREGATES - NEGATIVE FOR SIGNIFICANT CHRONIC CHANGES, ACTIVE INFLAMMATION, GRANULOMATA, DYSPLASIA OR MALIGNANCY 3. Surgical [P], colon, sigmoid and descending - BENIGN COLONIC MUCOSA WITH LYMPHOID AGGREGATES - NEGATIVE FOR SIGNIFICANT CHRONIC CHANGES, ACTIVE INFLAMMATION, GRANULOMATA, DYSPLASIA OR MALIGNANCY 4. Surgical [P], colon, rectum - BENIGN COLONIC MUCOSA WITH LYMPHOID AGGREGATES - NEGATIVE FOR SIGNIFICANT CHRONIC CHANGES, ACTIVE INFLAMMATION, GRANULOMATA, DYSPLASIA OR MALIGNANCY   Past Medical History:  Diagnosis Date   Allergy    Anxiety    Breast cancer (HCC)    Cancer (HCC)    Colitis    Crohn's disease (HCC)    Depression    Diverticulosis    Hyperlipidemia    Hypertension    Mild intermittent asthma    rarely uses inhaler   Nephrolithiasis    Personal history of radiation therapy    PONV (postoperative nausea and vomiting)    Stroke [facility])    Past Surgical History:  Procedure Laterality Date   BREAST BIOPSY Left 10/17/2019   BREAST LUMPECTOMY Left 12/02/2019   BREAST LUMPECTOMY WITH RADIOACTIVE SEED LOCALIZATION Left 12/02/2019   Procedure: LEFT BREAST LUMPECTOMY WITH RADIOACTIVE SEED LOCALIZATION;  Surgeon: Harriette Bouillon, MD;  Location: [facility];  Service: General;  Laterality: Left;   COMBINED HYSTEROSCOPY DIAGNOSTIC / D&C     CYSTOSCOPY W/ URETEROSCOPY W/ LITHOTRIPSY     dec 2022 and jan 2023,  [facility]   IR ANGIO INTRA EXTRACRAN SEL COM CAROTID INNOMINATE BILAT  MOD SED  02/10/2020   IR ANGIO VERTEBRAL SEL VERTEBRAL BILAT MOD SED  02/10/2020   IR US GUIDE VASC ACCESS RIGHT  02/10/2020   LEEP     LITHOTRIPSY     Multiple   RE-EXCISION OF BREAST LUMPECTOMY Left 12/24/2019   Procedure: RE-EXCISION OF LEFT BREAST LUMPECTOMY;  Surgeon: Harriette Bouillon, MD;  Location: [facility];  Service: General;   Laterality: Left;   RENAL ARTERY STENT Left    WISDOM TOOTH EXTRACTION      reports that she quit smoking about 28 years ago. Her smoking use included cigarettes. She started smoking about 38 years ago. She has a 10 pack-year smoking history. She has never used smokeless tobacco. She reports that she does not currently use alcohol. She reports that she does not use drugs. family history includes Allergic rhinitis in her mother; Alzheimer's disease in her father; Asthma in her brother; Breast cancer in her cousin; Colon cancer in her paternal grandmother; Dementia in her father; Diabetes in an other family member; Food Allergy in her mother; Hyperlipidemia in an other family member; Hypertension in an other family member. Allergies  Allergen Reactions   Azithromycin Hives   Nitrofurantoin Other (See Comments)    abd pain   Tamiflu [Oseltamivir Phosphate] Hives      Outpatient Encounter Medications as of 01/16/2024  Medication Sig   Adalimumab (HUMIRA, 2 PEN,) 40 MG/0.4ML PNKT INJECT 40 MG (1 PEN) UNDER THE SKIN EVERY 14 DAYS   albuterol (VENTOLIN HFA) 108 (90 Base) MCG/ACT inhaler Inhale 1-2 puffs into the lungs every 4 (four) hours as needed for wheezing or shortness of breath.   amLODipine (NORVASC) 5 MG tablet Take 1 tablet (5 mg total) by mouth daily.   augmented betamethasone dipropionate (DIPROLENE-AF) 0.05 % ointment Apply 1 Application topically daily.   desloratadine (CLARINEX) 5 MG tablet Take 5 mg by mouth daily.    ezetimibe (ZETIA) 10 MG tablet TAKE 1 TABLET DAILY   FLUoxetine (PROZAC) 20 MG capsule TAKE 1 CAPSULE DAILY   fluticasone (FLONASE) 50 MCG/ACT nasal spray Place 1 spray into both nostrils daily.   fluticasone (FLOVENT HFA) 110 MCG/ACT inhaler Inhale 2 puffs into the lungs 2 (two) times daily.   hydrocortisone 2.5 % ointment Apply 1 Application topically as needed.   indapamide (LOZOL) 1.25 MG tablet Take 1 tablet (1.25 mg total) by mouth daily.   levocetirizine  (XYZAL) 5 MG tablet Take 1 tablet (5 mg total) by mouth every evening.   metFORMIN (GLUCOPHAGE-XR) 500 MG 24 hr tablet Take 500 mg by mouth 2 (two) times daily.   montelukast (SINGULAIR) 10 MG tablet Take 1 tablet (10 mg total) by mouth at bedtime.   omega-3 acid ethyl esters (LOVAZA) 1 g capsule Take by mouth.   OVER THE COUNTER MEDICATION Take 7 drops by mouth 2 (two) times daily. CBD oil   POTASSIUM CITRATE PO Take 108 mg by mouth 2 (two) times daily.   rosuvastatin (CRESTOR) 10 MG tablet Take 2 tablets (20 mg total) by mouth daily.   vitamin B-12 (CYANOCOBALAMIN) 1000 MCG tablet Take 1,000 mcg by mouth daily.   Vitamin D, Ergocalciferol, (DRISDOL) 1.25 MG (50000 UNIT) CAPS capsule TAKE 1 CAPSULE EVERY 7 DAYS   [DISCONTINUED] cephALEXin (KEFLEX) 500 MG capsule Take 1 capsule (500 mg total) by mouth 2 (two) times daily.   [DISCONTINUED] ciclopirox (PENLAC) 8 % solution Apply topically at bedtime. Apply over nail and surrounding skin. Apply daily over previous coat. After seven (  7) days, may remove with alcohol and continue cycle.   [DISCONTINUED] indapamide (LOZOL) 1.25 MG tablet Take 1.25 mg by mouth daily.    [DISCONTINUED] indapamide (LOZOL) 1.25 MG tablet Take 1.25 mg by mouth daily.   [DISCONTINUED] predniSONE (DELTASONE) 20 MG tablet Take 2 tablets (40 mg total) by mouth daily with breakfast.   No facility-administered encounter medications on file as of 01/16/2024.    REVIEW OF SYSTEMS  : All other systems reviewed and negative except where noted in the History of Present Illness.   PHYSICAL EXAM: BP 100/62 (BP Location: Right Arm, Patient Position: Sitting, Cuff Size: Normal)   Pulse 72   Ht 5\' 4"  (1.626 m)   Wt 200 lb 4 oz (90.8 kg)   BMI 34.37 kg/m  General: Well developed white female in no acute distress Head: Normocephalic and atraumatic Eyes:  Sclerae anicteric, conjunctiva pink. Ears: Normal auditory acuity Lungs: Clear throughout to auscultation; no W/R/R. Heart:  Regular rate and rhythm; no M/R/G. Abdomen: Soft, non-distended  BS present.  Minimal left sided TTP. Musculoskeletal: Symmetrical with no gross deformities  Skin: No lesions on visible extremities Neurological: Alert oriented x 4, grossly non-focal Psychological:  Alert and cooperative. Normal mood and affect  ASSESSMENT AND PLAN: 58 year old female with Crohn's colitis diagnosed in 2017 maintained on Humira every 2 weeks and was previously on mesalamine, but no longer takes that.   Crohn's colitis (diagnosis 2017): Had some flare of symptoms just over a year ago and was suspected that she may need change medication.  Then fecal calprotectin was normal, Humira levels were normal, colonoscopy was normal so they decided to continue with her current Humira dosing.  She still on every 2 weeks.  For the past few months on and off though she has been experiencing some symptoms again with left-sided abdominal pain and change in bowel consistency.  Sometimes depending when she is eating, increase in stool frequency, could be up to 6 or so times a day.  CBC and CMP earlier this month look good.  Will check a fecal calprotectin.  She will need a quantiferon gold, but we will have that drawn after we check the fecal calprotectin.  If that is elevated then we would probably want to recheck Humira antibodies and drug levels (she just took her injection a few days ago).  They discussed switching to [facility] as an option.  For now she will continue her Humira 40 mg every 2 weeks.   CC:  Donato Schultz, *

## 2024-01-16 NOTE — Patient Instructions (Signed)
Your provider has requested that you go to the basement level for lab work before leaving today. Press "B" on the elevator. The lab is located at the first door on the left as you exit the elevator.  _______________________________________________________  If your blood pressure at your visit was 140/90 or greater, please contact your primary care physician to follow up on this.  _______________________________________________________  If you are age 58 or older, your body mass index should be between 23-30. Your Body mass index is 34.37 kg/m. If this is out of the aforementioned range listed, please consider follow up with your Primary Care Provider.  If you are age 73 or younger, your body mass index should be between 19-25. Your Body mass index is 34.37 kg/m. If this is out of the aformentioned range listed, please consider follow up with your Primary Care Provider.   ________________________________________________________  The [facility] providers would like to encourage you to use [facility] to communicate with providers for non-urgent requests or questions.  Due to long hold times on the telephone, sending your provider a message by [facility] may be a faster and more efficient way to get a response.  Please allow 48 business hours for a response.  Please remember that this is for non-urgent requests.  _______________________________________________________

## 2024-01-17 ENCOUNTER — Other Ambulatory Visit: Payer: Commercial Managed Care - PPO

## 2024-01-17 DIAGNOSIS — K5010 Crohn's disease of large intestine without complications: Secondary | ICD-10-CM

## 2024-01-20 LAB — CALPROTECTIN, FECAL: Calprotectin, Fecal: 30 ug/g (ref 0–120)

## 2024-01-22 NOTE — Progress Notes (Signed)
Addendum: Reviewed and agree with assessment and management plan. Normal fecal calprotectin.  Very unlikely to be active Crohn's disease in light of previous symptoms and negative colonoscopy.  Humira appears to be working. Would treat for IBS-D with rifaximin 550 mg 3 times daily x 2 weeks and then have her report back as to her abdominal cramping and discomfort symptoms  Shaana Acocella, Carie Caddy, MD

## 2024-01-23 ENCOUNTER — Telehealth: Payer: Self-pay

## 2024-01-23 NOTE — Telephone Encounter (Signed)
-----   Message from [place] D. Zehr sent at 01/23/2024 12:53 PM EST ----- Please let her know that her fecal calprotectin was normal and I discussed with Dr. Rhea Belton.  His thoughts are below. ----- Message ----- From: Beverley Fiedler, MD Sent: 01/22/2024   1:57 PM EST To: Princella Pellegrini Zehr, PA-C  With a normal fecal calprotectin I do not think this is Crohn's I would treat more like IBS and give rifaximin 550 mg 3 times daily x 14 days About 2 weeks after treatment she should let us know how she feels Thanks JMP ----- Message ----- From: Leta Baptist, PA-C Sent: 01/21/2024   1:35 PM EST To: Beverley Fiedler, MD  Please see my office note on her from last week.  Fecal calprotectin is normal, unchanged from previous.  I wonder if she could have more of an IBS overlap.  Do want me to check her antibodies and drug levels?  Thanks,  JZ ----- Message ----- From: Interface, [facility] Lab Results In Sent: 01/20/2024   6:40 AM EST To: Leta Baptist, PA-C

## 2024-01-23 NOTE — Telephone Encounter (Signed)
Left message on machine to call back

## 2024-01-24 MED ORDER — RIFAXIMIN 550 MG PO TABS: 550.0000 mg | ORAL_TABLET | Freq: Three times a day (TID) | ORAL | 0 refills | Status: AC

## 2024-01-24 NOTE — Telephone Encounter (Signed)
I  have spoken to the pt and made her aware of the results.  She agrees to the prescription-this has been sent. She will call if there are insurance or payment issues.  She does tell me that she is going to [place] for 10 days and will start when she is back in the country.

## 2024-03-06 DIAGNOSIS — K5010 Crohn's disease of large intestine without complications: Secondary | ICD-10-CM

## 2024-03-06 NOTE — Telephone Encounter (Signed)
 This is a biologic prescription for Dr Rhea Belton.

## 2024-03-07 MED ORDER — HUMIRA (2 PEN) 40 MG/0.4ML ~~LOC~~ AJKT: AUTO-INJECTOR | SUBCUTANEOUS | 5 refills | Status: DC

## 2024-03-07 NOTE — Addendum Note (Signed)
 Addended by: Richardson Chiquito on: 03/07/2024 12:18 PM   Modules accepted: Orders

## 2024-03-11 ENCOUNTER — Telehealth: Payer: Self-pay | Admitting: Internal Medicine

## 2024-03-11 NOTE — Telephone Encounter (Signed)
 [employer] pharmacy rep called and stated that she was calling to inform Dr. Rhea Belton that Humira 40 MG  0.4 ML has been converted to biosimilary RYZK manufacturer quanty form want [place] to update patient records. And if we have any question regarding the change of this medication to please contact [phone]. [employer] rep name is [place]. Please advise.

## 2024-03-12 ENCOUNTER — Other Ambulatory Visit: Payer: Self-pay

## 2024-03-12 NOTE — Telephone Encounter (Signed)
 New order entered foe Ryvk Humira.

## 2024-04-08 ENCOUNTER — Other Ambulatory Visit: Payer: Commercial Managed Care - PPO

## 2024-04-08 ENCOUNTER — Other Ambulatory Visit (INDEPENDENT_AMBULATORY_CARE_PROVIDER_SITE_OTHER)

## 2024-04-08 DIAGNOSIS — E1169 Type 2 diabetes mellitus with other specified complication: Secondary | ICD-10-CM | POA: Diagnosis not present

## 2024-04-08 DIAGNOSIS — E118 Type 2 diabetes mellitus with unspecified complications: Secondary | ICD-10-CM

## 2024-04-08 DIAGNOSIS — E785 Hyperlipidemia, unspecified: Secondary | ICD-10-CM | POA: Diagnosis not present

## 2024-04-08 DIAGNOSIS — K5010 Crohn's disease of large intestine without complications: Secondary | ICD-10-CM

## 2024-04-08 DIAGNOSIS — I10 Essential (primary) hypertension: Secondary | ICD-10-CM | POA: Diagnosis not present

## 2024-04-08 LAB — HEMOGLOBIN A1C: Hgb A1c MFr Bld: 6 % (ref 4.6–6.5)

## 2024-04-08 LAB — COMPREHENSIVE METABOLIC PANEL WITH GFR
ALT: 32 U/L (ref 0–35)
AST: 29 U/L (ref 0–37)
Albumin: 4.9 g/dL (ref 3.5–5.2)
Alkaline Phosphatase: 73 U/L (ref 39–117)
BUN: 21 mg/dL (ref 6–23)
CO2: 29 meq/L (ref 19–32)
Calcium: 10 mg/dL (ref 8.4–10.5)
Chloride: 101 meq/L (ref 96–112)
Creatinine, Ser: 0.66 mg/dL (ref 0.40–1.20)
GFR: 97.3 mL/min (ref >60.00)
Glucose, Bld: 105 mg/dL — ABNORMAL HIGH (ref 70–99)
Potassium: 3.8 meq/L (ref 3.5–5.1)
Sodium: 139 meq/L (ref 135–145)
Total Bilirubin: 0.7 mg/dL (ref 0.2–1.2)
Total Protein: 7.3 g/dL (ref 6.0–8.3)

## 2024-04-08 LAB — LIPID PANEL
Cholesterol: 113 mg/dL (ref 0–200)
HDL: 54.8 mg/dL (ref >39.00)
LDL Cholesterol: 41 mg/dL (ref 0–99)
NonHDL: 57.82
Total CHOL/HDL Ratio: 2
Triglycerides: 82 mg/dL (ref 0.0–149.0)
VLDL: 16.4 mg/dL (ref 0.0–40.0)

## 2024-04-10 LAB — QUANTIFERON-TB GOLD PLUS
Mitogen-NIL: 8.79 [IU]/mL
NIL: 0.02 [IU]/mL
QuantiFERON-TB Gold Plus: NEGATIVE
TB1-NIL: 0 [IU]/mL
TB2-NIL: 0.01 [IU]/mL

## 2024-04-11 ENCOUNTER — Encounter: Payer: Self-pay | Admitting: Family Medicine

## 2024-04-17 ENCOUNTER — Encounter: Payer: Self-pay | Admitting: Internal Medicine

## 2024-05-02 ENCOUNTER — Encounter: Payer: Self-pay | Admitting: Family Medicine

## 2024-05-02 ENCOUNTER — Ambulatory Visit: Admitting: Family Medicine

## 2024-05-02 VITALS — BP 130/74 | HR 78 | Temp 98.3°F | Resp 18 | Ht 64.0 in | Wt 197.6 lb

## 2024-05-02 DIAGNOSIS — M71341 Other bursal cyst, right hand: Secondary | ICD-10-CM | POA: Diagnosis not present

## 2024-05-02 DIAGNOSIS — M79641 Pain in right hand: Secondary | ICD-10-CM

## 2024-05-02 NOTE — Progress Notes (Signed)
 Established Patient Office Visit  Subjective   Patient ID: Lindsey [name], female    DOB: [birth year]  Age: 58 y.o. MRN: [id number]  Chief Complaint  Patient presents with   Hand Injury    Right hand, pt states moving furn on Monday, pt has right bump    HPI Discussed the use of AI scribe software for clinical note transcription with the patient, who gave verbal consent to proceed.  History of Present Illness Lindsey [name] is a 58 year old female who presents with right hand swelling and bruising after trauma.  She hit her right hand on a piece of furniture while moving it on Monday, resulting in immediate swelling and bruising. The swelling was significant, and she notes a tendency to swell easily when she bumps into things. The area developed a purple discoloration, described as 'purple all around'.  The swelling and bruising are localized to the right hand, ending at the knuckles. She is right-handed and experiences pain only when the hand is hit again. Despite the swelling and bruising, she can make a fist and pick things up without difficulty.  She has not applied ice or warm compresses and is not taking any medication for pain, stating 'No, I'm fine'. The swelling occurred immediately after the injury and was initially accompanied by hives, a common reaction for her. The swelling reduced after two days, but the purple discoloration persisted.  No prior history of visiting the sports medicine facility was mentioned. No current pain unless the area is hit again and she confirms the ability to move her hand and perform tasks such as making a fist and picking things up without issue.   Patient Active Problem List   Diagnosis Date Noted   Urinary frequency 05/08/2023   Preventative health care 04/25/2022   Morbid obesity (HCC) 04/25/2022   Hyperglycemia 10/25/2021   Cerebral embolism with cerebral infarction 02/02/2020   Acute encephalopathy 02/02/2020   TIA  (transient ischemic attack) 02/02/2020   Acute confusion 02/01/2020   Malignant neoplasm of upper-outer quadrant of left breast in female, estrogen receptor positive (HCC) 10/29/2019   Hyperlipidemia 03/13/2019   High risk medication use 03/13/2019   Crohn's disease without complication (HCC) 12/11/2017   Mild persistent asthma without complication 10/16/2017   Crohn's colitis (HCC) 02/07/2017   Mild intermittent asthma 08/23/2016   Chronic rhinitis 08/23/2016   Acute cystitis without hematuria 05/05/2015   Nephrolithiasis 05/05/2015   Paronychia 09/21/2014   Calcium  nephrolithiasis 06/03/2014   Obesity (BMI 30-39.9) 05/14/2014   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   Idiopathic urticaria 02/23/2012   TINEA PEDIS 11/24/2010   Dysuria 09/19/2010   History of allergy 09/19/2010   Backache 04/01/2010   PAIN IN THORACIC SPINE 12/13/2009   B12 deficiency 03/09/2009   SINUSITIS - ACUTE-NOS 03/09/2009   NEPHROLITHIASIS, HX OF 07/29/2008   BACK STRAIN, LUMBAR 02/18/2008   Hyperlipidemia LDL goal <100 11/05/2007   Anxiety state 08/15/2007   BRONCHITIS, ACUTE 08/15/2007   FATIGUE 08/15/2007   PALPITATIONS, OCCASIONAL 08/15/2007   Primary hypertension 05/15/2007   Past Medical History:  Diagnosis Date   Allergy    Anxiety    Breast cancer (HCC)    Cancer (HCC)    Colitis    Crohn's disease (HCC)    Depression    Diverticulosis    Hyperlipidemia    Hypertension    Mild intermittent asthma    rarely uses inhaler   Nephrolithiasis    Personal history of radiation therapy  PONV (postoperative nausea and vomiting)    Stroke [facility])    Past Surgical History:  Procedure Laterality Date   BREAST BIOPSY Left 10/17/2019   BREAST LUMPECTOMY Left 12/02/2019   BREAST LUMPECTOMY WITH RADIOACTIVE SEED LOCALIZATION Left 12/02/2019   Procedure: LEFT BREAST LUMPECTOMY WITH RADIOACTIVE SEED LOCALIZATION;  Surgeon: Sim Dryer, MD;  Location: [facility];  Service: General;   Laterality: Left;   COMBINED HYSTEROSCOPY DIAGNOSTIC / D&C     CYSTOSCOPY W/ URETEROSCOPY W/ LITHOTRIPSY     dec 2022 and jan 2023,  [facility]   IR ANGIO INTRA EXTRACRAN SEL COM CAROTID INNOMINATE BILAT MOD SED  02/10/2020   IR ANGIO VERTEBRAL SEL VERTEBRAL BILAT MOD SED  02/10/2020   IR US  GUIDE VASC ACCESS RIGHT  02/10/2020   LEEP     LITHOTRIPSY     Multiple   RE-EXCISION OF BREAST LUMPECTOMY Left 12/24/2019   Procedure: RE-EXCISION OF LEFT BREAST LUMPECTOMY;  Surgeon: Sim Dryer, MD;  Location: [facility];  Service: General;  Laterality: Left;   RENAL ARTERY STENT Left    WISDOM TOOTH EXTRACTION     Social History   Tobacco Use   Smoking status: Former    Current packs/day: 0.00    Average packs/day: 1 pack/day for 10.0 years (10.0 ttl pk-yrs)    Types: Cigarettes    Start date: 12/25/1985    Quit date: 12/26/1995    Years since quitting: 28.3   Smokeless tobacco: Never  Vaping Use   Vaping status: Never Used  Substance Use Topics   Alcohol use: Not Currently    Comment: occasional wine    Drug use: No   Social History   Socioeconomic History   Marital status: Married    Spouse name: Not on file   Number of children: Not on file   Years of education: Not on file   Highest education level: Not on file  Occupational History   Not on file  Tobacco Use   Smoking status: Former    Current packs/day: 0.00    Average packs/day: 1 pack/day for 10.0 years (10.0 ttl pk-yrs)    Types: Cigarettes    Start date: 12/25/1985    Quit date: 12/26/1995    Years since quitting: 28.3   Smokeless tobacco: Never  Vaping Use   Vaping status: Never Used  Substance and Sexual Activity   Alcohol use: Not Currently    Comment: occasional wine    Drug use: No   Sexual activity: Yes  Other Topics Concern   Not on file  Social History Narrative   Occupation: [employer]   Married   Regular exercise- yes   Social Drivers of Corporate investment banker Strain: Low Risk   (12/31/2023)   Received from [facility]   Overall Financial Resource Strain (CARDIA)    Difficulty of Paying Living Expenses: Not hard at all  Food Insecurity: No Food Insecurity (12/31/2023)   [facility]   Hunger Vital Sign    Worried About Running Out of Food in the Last Year: Never true    Ran Out of Food in the Last Year: Never true  Transportation Needs: No Transportation Needs (12/31/2023)   Received from [facility] - [facility]    Lack of Transportation (Medical): No    Lack of Transportation (Non-Medical): No  Physical Activity: Not on file  Stress: Not on file  Social Connections: Unknown (05/08/2022)   [facility]   Social Network    Social Network: Not on file  Intimate Partner Violence: Unknown (03/30/2022)   [facility] from [facility]   HITS    Physically Hurt: Not on file    Insult or Talk Down To: Not on file    Threaten Physical Harm: Not on file    Scream or Curse: Not on file   Family Status  Relation Name Status   Mother  Alive   Father  Deceased   Sister  Alive   Brother  (Not Specified)   PGM  (Not Specified)   Cousin  (Not Specified)   Other  (Not Specified)   Other  (Not Specified)   Other  (Not Specified)   Neg Hx  (Not Specified)  No partnership data on file   Family History  Problem Relation Age of Onset   Allergic rhinitis Mother    Food Allergy Mother        peanut   Dementia Father    Alzheimer's disease Father    Asthma Brother    Colon cancer Paternal Grandmother    Breast cancer Cousin    Hyperlipidemia Other    Hypertension Other    Diabetes Other    Crohn's disease Neg Hx    Esophageal cancer Neg Hx    Stomach cancer Neg Hx    Rectal cancer Neg Hx    Angioedema Neg Hx    Eczema Neg Hx    Immunodeficiency Neg Hx    Urticaria Neg Hx    Allergies  Allergen Reactions   Azithromycin  Hives   Nitrofurantoin Other (See Comments)    abd pain    Tamiflu [Oseltamivir Phosphate] Hives      ROS    Objective:     BP 130/74 (BP Location: Left Arm, Patient Position: Sitting, Cuff Size: Large)   Pulse 78   Temp 98.3 F (36.8 C) (Oral)   Resp 18   Ht 5\' 4"  (1.626 m)   Wt 197 lb 9.6 oz (89.6 kg)   SpO2 97%   BMI 33.92 kg/m  BP Readings from Last 3 Encounters:  05/02/24 130/74  01/16/24 100/62  01/03/24 130/82   Wt Readings from Last 3 Encounters:  05/02/24 197 lb 9.6 oz (89.6 kg)  01/16/24 200 lb 4 oz (90.8 kg)  01/03/24 197 lb (89.4 kg)   SpO2 Readings from Last 3 Encounters:  05/02/24 97%  01/03/24 98%  12/16/23 97%      Physical Exam   No results found for any visits on 05/02/24.  Last CBC Lab Results  Component Value Date   WBC 6.2 01/07/2024   HGB 14.8 01/07/2024   HCT 44.2 01/07/2024   MCV 89.2 01/07/2024   MCH 29.8 12/06/2021   RDW 13.7 01/07/2024   PLT 301.0 01/07/2024   Last metabolic panel Lab Results  Component Value Date   GLUCOSE 105 (H) 04/08/2024   NA 139 04/08/2024   K 3.8 04/08/2024   CL 101 04/08/2024   CO2 29 04/08/2024   BUN 21 04/08/2024   CREATININE 0.66 04/08/2024   GFR 97.30 04/08/2024   CALCIUM  10.0 04/08/2024   PROT 7.3 04/08/2024   ALBUMIN 4.9 04/08/2024   BILITOT 0.7 04/08/2024   ALKPHOS 73 04/08/2024   AST 29 04/08/2024   ALT 32 04/08/2024   ANIONGAP 12 12/06/2021   Last lipids Lab Results  Component Value Date   CHOL 113 04/08/2024   HDL 54.80 04/08/2024   LDLCALC 41  04/08/2024   LDLDIRECT 123.2 12/20/2011   TRIG 82.0 04/08/2024   CHOLHDL 2 04/08/2024   Last hemoglobin A1c Lab Results  Component Value Date   HGBA1C 6.0 04/08/2024   Last thyroid  functions Lab Results  Component Value Date   TSH 3.02 01/07/2024   Last vitamin D  Lab Results  Component Value Date   VD25OH 62.90 09/18/2022   Last vitamin B12 and Folate Lab Results  Component Value Date   VITAMINB12 961 (H) 09/18/2022   FOLATE 9.9 09/06/2010      The ASCVD Risk score  (Arnett DK, et al., 2019) failed to calculate for the following reasons:   Risk score cannot be calculated because patient has a medical history suggesting prior/existing ASCVD    Assessment & Plan:   Problem List Items Addressed This Visit   None Visit Diagnoses       Other bursal cyst, right hand    -  Primary   Relevant Orders   Ambulatory referral to Sports Medicine     Pain of right hand       Relevant Orders   Ambulatory referral to Sports Medicine      Assessment and Plan Assessment & Plan Hand injury with swelling and bruising   She sustained an acute hand injury with immediate swelling and subsequent bruising after hitting furniture. Pain occurs only upon impact. Differential diagnosis includes hematoma, ganglion cyst, or bursa injury. She retains full functional ability, as she can make a fist and pick up objects without difficulty. An ultrasound evaluation is preferred to assess the swelling, as x-ray is unlikely to provide useful information for soft tissue injury. Refer to sports medicine for ultrasound evaluation. Avoid x-ray. Wrapping is unnecessary unless concerned about further trauma. No current need for pain medication.   Return if symptoms worsen or fail to improve.    Primus Gritton R Lowne Chase, DO

## 2024-05-02 NOTE — Patient Instructions (Signed)
Hand Pain Hand pain can make it hard to do daily activities. Many things can cause hand pain. Some common causes are: Injuries. These may include: Broken bones (fractures)and cuts. Overuse injuries from doing the same movements many times (repetitive activity). Arthritis. Lumps in the tendons or joints of the hand and wrist (ganglion cysts). Nerve compression syndromes (carpal tunnel syndrome). Inflammation of the tendons (tendinitis). Infection. Follow these instructions at home: Managing pain, stiffness, and swelling     Take over-the-counter and prescription medicines only as told by your health care provider. If told, put ice on the affected area. Put ice in a plastic bag. Place a towel between your skin and the bag. Leave the ice on for 20 minutes, 2-3 times a day. If told, apply heat to the affected area before you exercise or as often as told by your provider. Use the heat source that your provider recommends, such as a moist heat pack or a heating pad. Place a towel between your skin and the heat source. Leave the heat on for 20-30 minutes. If your skin turns bright red, remove the ice or heat right away to prevent skin damage. The risk of damage is higher if you cannot feel pain, heat, or cold. Activity Take breaks from repetitive activity often. Minimize stress on your hands and wrists as much as possible. Do stretches or exercises as told by your provider. Do not do activities that make your pain worse. Wear a hand splint or support as told by your provider. Contact a health care provider if: Your pain does not get better after a few days. Your pain gets worse. Your pain affects your ability to do your daily activities. Your hand becomes warm, red, or swollen. Your hand is numb or tingling. Get help right away if: Your hand is extremely swollen or is an unusual shape. Your hand or fingers turn white or blue. You cannot move your hand, wrist, or fingers. This  information is not intended to replace advice given to you by your health care provider. Make sure you discuss any questions you have with your health care provider. Document Revised: 07/19/2022 Document Reviewed: 07/19/2022 Elsevier Patient Education  2024 Elsevier Inc.  

## 2024-05-05 ENCOUNTER — Ambulatory Visit: Admitting: Family Medicine

## 2024-05-05 ENCOUNTER — Other Ambulatory Visit: Payer: Self-pay

## 2024-05-05 ENCOUNTER — Encounter: Payer: Self-pay | Admitting: Family Medicine

## 2024-05-05 VITALS — BP 130/84 | HR 82 | Ht 64.0 in | Wt 197.0 lb

## 2024-05-05 DIAGNOSIS — M79641 Pain in right hand: Secondary | ICD-10-CM

## 2024-05-05 DIAGNOSIS — S60221A Contusion of right hand, initial encounter: Secondary | ICD-10-CM | POA: Diagnosis not present

## 2024-05-05 NOTE — Progress Notes (Unsigned)
   I, Miquel Amen, CMA acting as a scribe for Garlan Juniper, MD.  Lindsey [name] is a 58 y.o. female who presents to [facility] today for R hand pain ongoing since last Monday. Pt hit her hand on a piece of furniture. She is RHD. Pt locates pain to dorsal aspect of the hand. Swelling and hives present at time of injury. Palpable soft mass present today. Has tried ice. Feels TTP.   Swelling: yes Aggravates: palpation Treatments tried: ice  Pertinent review of systems: ***  Relevant historical information: ***   Exam:  There were no vitals taken for this visit. General: Well Developed, well nourished, and in no acute distress.   MSK: ***    Lab and Radiology Results No results found for this or any previous visit (from the past 72 hours). No results found.     Assessment and Plan: 58 y.o. female with ***   PDMP not reviewed this encounter. No orders of the defined types were placed in this encounter.  No orders of the defined types were placed in this encounter.    Discussed warning signs or symptoms. Please see discharge instructions. Patient expresses understanding.   ***

## 2024-05-05 NOTE — Patient Instructions (Addendum)
 Thank you for coming in today.   Try using a Compression Glove.   See you back as needed.

## 2024-05-08 ENCOUNTER — Other Ambulatory Visit: Payer: Self-pay | Admitting: Family Medicine

## 2024-06-20 ENCOUNTER — Encounter (HOSPITAL_COMMUNITY): Payer: Self-pay | Admitting: Interventional Radiology

## 2024-07-09 ENCOUNTER — Telehealth: Payer: Self-pay

## 2024-07-09 ENCOUNTER — Other Ambulatory Visit (HOSPITAL_COMMUNITY): Payer: Self-pay

## 2024-07-09 NOTE — Telephone Encounter (Signed)
 Pharmacy Patient Advocate Encounter   Received notification from CoverMyMeds that prior authorization for Adalimumab -ryvk (2 Pen) 40MG /0.4ML auto-injector kit is required/requested.   Insurance verification completed.   The patient is insured through [employer] .   Prior Authorization for Adalimumab -ryvk (2 Pen) 40MG /0.4ML auto-injector kit has been APPROVED from 06-09-2024 to 07-09-2025   PA #/Case ID/Reference #: [id number]

## 2024-07-17 LAB — LAB REPORT - SCANNED
A1c: 6.2
EGFR: 99

## 2024-08-18 ENCOUNTER — Other Ambulatory Visit: Payer: Self-pay | Admitting: Family Medicine

## 2024-08-18 DIAGNOSIS — I10 Essential (primary) hypertension: Secondary | ICD-10-CM

## 2024-08-22 ENCOUNTER — Other Ambulatory Visit: Payer: Self-pay | Admitting: Internal Medicine

## 2024-09-15 ENCOUNTER — Ambulatory Visit
Admission: RE | Admit: 2024-09-15 | Discharge: 2024-09-15 | Disposition: A | Discharge status: Home / Self Care | Source: Ambulatory Visit | Attending: Hematology and Oncology

## 2024-09-15 DIAGNOSIS — Z170 Estrogen receptor positive status [ER+]: Secondary | ICD-10-CM

## 2024-10-20 ENCOUNTER — Inpatient Hospital Stay: Payer: Commercial Managed Care - PPO | Attending: Hematology and Oncology | Admitting: Hematology and Oncology

## 2024-10-20 VITALS — BP 141/66 | HR 85 | Temp 97.7°F | Resp 16 | Wt 195.1 lb

## 2024-10-20 DIAGNOSIS — Z86000 Personal history of in-situ neoplasm of breast: Secondary | ICD-10-CM | POA: Insufficient documentation

## 2024-10-20 DIAGNOSIS — C50412 Malignant neoplasm of upper-outer quadrant of left female breast: Secondary | ICD-10-CM

## 2024-10-20 DIAGNOSIS — Z170 Estrogen receptor positive status [ER+]: Secondary | ICD-10-CM | POA: Diagnosis not present

## 2024-10-20 NOTE — Progress Notes (Signed)
 [facility]  Telephone:([phone]) [phone] Fax:([phone]     ID: Lindsey [name] DOB: 12-Dec-1966  MR#: [id number]  RDW#:[id number]  Patient Care Team: Antonio Meth, Jamee SAUNDERS, DO as PCP - General Bobbitt, Elgin Pepper, MD as Consulting Physician (Allergy and Immunology) Vanderbilt Ned, MD as Consulting Physician (General Surgery) Pyrtle, Gordy HERO, MD as Consulting Physician (Gastroenterology) Shona Layman BROCKS, MD as Consulting Physician (Urology) Tyree Nanetta SAILOR, RN as Oncology Nurse Navigator Izell Domino, MD as Attending Physician (Radiation Oncology) Rosemarie Eather RAMAN, MD as Consulting Physician (Neurology) Marne Kelly Nest, MD as Consulting Physician (Obstetrics and Gynecology) Amber Stalls, MD OTHER MD:  CHIEF COMPLAINT: noninvasive breast cancer, estrogen receptor positive  CURRENT TREATMENT:  Considering anastrozole   INTERVAL HISTORY:  Lindsey [name] was scheduled today for follow up of her noninvasive breast cancer.   She is doing very well overall.  She denies any new health issues.  She is going to the beach for the weekend and she is excited.  She had a mammogram in September with no evidence of malignancy.  She is exercising, eating healthy and sleeping regularly.  Rest of the pertinent 10 point ROS reviewed and negative   COVID 19 VACCINATION STATUS: Moderna x2; infection 03/2021   HISTORY OF CURRENT ILLNESS: From the original intake note:  Lindsey [name] had routine screening mammography on 10/08/2017 showing a possible abnormality in the left breast. She underwent left diagnostic mammography with tomography and left breast ultrasonography at [facility] on 10/17/2017 showing: breast density category B; 9 mm indeterminate left breast mass without sonographic correlate. She was scheduled to undergo biopsy of the left breast mass on 10/22/2017, but the mass was not clearly seen on stereotactic imaging. Left diagnostic mammogram was  performed instead and showed the mass as smaller and less conspicuous. Short term follow up was recommended.   She underwent left diagnostic mammogram and left breast ultrasonography on 01/23/2018 and bilateral diagnostic mammogram on 10/11/2018. When she returned for follow up bilateral diagnostic mammogram on 10/15/2019, this showed: breast density category B; new 6 mm group of calcifications within the upper-outer left breast; interval increase in size of lobular mass within the 12 o'clock position. Left breast ultrasonography performed that day measured the 12 o'clock left breast mass at 8 mm and showed no left axillary adenopathy.  Accordingly on 10/17/2019 she proceeded to biopsy of the left breast areas in question. 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   Pathology from the left 12 o'clock mass was negative for malignancy, showing fibrocystic changes with apocrine metaplasia. However, the biopsy clip was found to be located posterior to the mammographically identified mass, so she proceeded to repeat biopsy of the area on 10/30/2019. Pathology [id number]) was again benign, showing fibrocystic changes and fat necrosis.  She opted to proceed with left breast lumpectomy on 12/02/2019 under Dr. Vanderbilt. Pathology from the procedure ([id number]) showed: intermediate grade ductal carcinoma in situ, 0.6 cm; cauterized carcinoma in situ focally present at the lateral margin. Prognostic indicators significant for: estrogen receptor 90% positive with weak staining intensity, and progesterone  receptor 100% positive with strong staining intensity.  The patient's subsequent history is as detailed below.   PAST MEDICAL HISTORY: Past Medical History:   Diagnosis Date  . Allergy   . Anxiety   .  Breast cancer (HCC)   . Cancer (HCC)   . Colitis   . Crohn's disease (HCC)   . Depression   . Diverticulosis   . Hyperlipidemia   . Hypertension   . Mild intermittent asthma    rarely uses inhaler  . Nephrolithiasis   . Personal history of radiation therapy   . PONV (postoperative nausea and vomiting)   . Stroke [facility])     PAST SURGICAL HISTORY: Past Surgical History:  Procedure Laterality Date  . BREAST BIOPSY Left 10/17/2019  . BREAST LUMPECTOMY Left 12/02/2019  . BREAST LUMPECTOMY WITH RADIOACTIVE SEED LOCALIZATION Left 12/02/2019   Procedure: LEFT BREAST LUMPECTOMY WITH RADIOACTIVE SEED LOCALIZATION;  Surgeon: Vanderbilt Ned, MD;  Location: [facility];  Service: General;  Laterality: Left;  . COMBINED HYSTEROSCOPY DIAGNOSTIC / D&C    . CYSTOSCOPY W/ URETEROSCOPY W/ LITHOTRIPSY     dec 2022 and jan 2023,  [facility]  . IR ANGIO INTRA EXTRACRAN SEL COM CAROTID INNOMINATE BILAT MOD SED  02/10/2020  . IR ANGIO VERTEBRAL SEL VERTEBRAL BILAT MOD SED  02/10/2020  . IR US  GUIDE VASC ACCESS RIGHT  02/10/2020  . LEEP    . LITHOTRIPSY     Multiple  . RE-EXCISION OF BREAST LUMPECTOMY Left 12/24/2019   Procedure: RE-EXCISION OF LEFT BREAST LUMPECTOMY;  Surgeon: Vanderbilt Ned, MD;  Location: [facility];  Service: General;  Laterality: Left;  . RENAL ARTERY STENT Left   . WISDOM TOOTH EXTRACTION      FAMILY HISTORY: Family History  Problem Relation Age of Onset  . Allergic rhinitis Mother   . Food Allergy Mother        peanut  . Dementia Father   . Alzheimer's disease Father   . Asthma Brother   . Colon cancer Paternal Grandmother   . Breast cancer Cousin   . Hyperlipidemia Other   . Hypertension Other   . Diabetes Other   . Crohn's disease Neg Hx   . Esophageal cancer Neg Hx   . Stomach cancer Neg Hx   . Rectal cancer Neg Hx   . Angioedema Neg Hx   . Eczema Neg Hx   . Immunodeficiency Neg Hx   .  Urticaria Neg Hx   The patient's father died from Alzheimer's disease at age 35.  The patient's mother is 45 years old as of December 2020.  The patient denies a family hx of ovarian cancer. She reports breast cancer in a maternal cousin at age 71 (recurrent stage IV in her late 10s) and in a maternal great aunt. She also notes uterine cancer in her maternal grandmother, bladder cancer in an uncle, and colon cancer in her paternal grandmother.  The patient has 2 sisters, no brothers   GYNECOLOGIC HISTORY:  No LMP recorded. (Menstrual status: Perimenopausal). Menarche: 58 years old Age at first live birth: 58 years old GX P 3 LMP current, irregular, about every 2 months Contraceptive: Barrier methods HRT no Hysterectomy? no BSO?  No   SOCIAL HISTORY: (updated 11/2019)  Lindsey [name] is currently working at the [employer].  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  Her husband India works at [employer].  The patient has 3 children, Joane FOLKS teaches physical education in [place] , Ozell 17 and Johnny 15 are at home.  India has a child Kristin from an earlier marriage.  She is 26     ADVANCED DIRECTIVES: In the absence  of any documents to the contrary the patient's husband is her healthcare power of attorney   HEALTH MAINTENANCE: Social History   Tobacco Use  . Smoking status: Former    Current packs/day: 0.00    Average packs/day: 1 pack/day for 10.0 years (10.0 ttl pk-yrs)    Types: Cigarettes    Start date: 12/25/1985    Quit date: 12/26/1995    Years since quitting: 28.8  . Smokeless tobacco: Never  Vaping Use  . Vaping status: Never Used  Substance Use Topics  . Alcohol use: Not Currently    Comment: occasional wine   . Drug use: No     Colonoscopy: 12/2018, repeat in 5 years  PAP: Per Dr. Rosalynn  Bone density: Remote   Allergies  Allergen Reactions  . Azithromycin  Hives  . Nitrofurantoin Other (See Comments)    abd  pain  . Tamiflu [Oseltamivir Phosphate] Hives    Current Outpatient Medications  Medication Sig Dispense Refill  . adalimumab  (HUMIRA , 2 PEN,) 40 MG/0.4ML pen INJECT 40 MG (1 PEN) UNDER THE SKIN EVERY 14 DAYS 2 each 5  . Adalimumab -adbm, 2 Pen, 40 MG/0.4ML AJKT INJECT THE CONTENTS OF 1 PEN (40 MG) UNDER THE SKIN EVERY 14 DAYS 6 each 0  . albuterol  (VENTOLIN  HFA) 108 (90 Base) MCG/ACT inhaler Inhale 1-2 puffs into the lungs every 4 (four) hours as needed for wheezing or shortness of breath. 18 g 3  . amLODipine  (NORVASC ) 5 MG tablet TAKE 1 TABLET DAILY 90 tablet 1  . augmented betamethasone dipropionate (DIPROLENE-AF) 0.05 % ointment Apply 1 Application topically daily.    . desloratadine  (CLARINEX ) 5 MG tablet Take 5 mg by mouth daily.   4  . ezetimibe  (ZETIA ) 10 MG tablet TAKE 1 TABLET DAILY 90 tablet 3  . FLUoxetine  (PROZAC ) 20 MG capsule TAKE 1 CAPSULE DAILY 90 capsule 1  . fluticasone  (FLONASE ) 50 MCG/ACT nasal spray Place 1 spray into both nostrils daily.    . fluticasone  (FLOVENT  HFA) 110 MCG/ACT inhaler Inhale 2 puffs into the lungs 2 (two) times daily. 1 each 0  . hydrocortisone 2.5 % ointment Apply 1 Application topically as needed.    . indapamide  (LOZOL ) 1.25 MG tablet Take 1 tablet (1.25 mg total) by mouth daily. 90 tablet 3  . levocetirizine (XYZAL ) 5 MG tablet Take 1 tablet (5 mg total) by mouth every evening. 30 tablet 0  . metFORMIN (GLUCOPHAGE-XR) 500 MG 24 hr tablet Take 500 mg by mouth 2 (two) times daily.    . montelukast  (SINGULAIR ) 10 MG tablet Take 1 tablet (10 mg total) by mouth at bedtime. 30 tablet 0  . omega-3 acid ethyl esters (LOVAZA ) 1 g capsule Take by mouth.    SABRA OVER THE COUNTER MEDICATION Take 7 drops by mouth 2 (two) times daily. CBD oil    . POTASSIUM CITRATE  PO Take 108 mg by mouth 2 (two) times daily.    . rosuvastatin  (CRESTOR ) 10 MG tablet Take 2 tablets (20 mg total) by mouth daily. 180 tablet 0  . vitamin B-12 (CYANOCOBALAMIN) 1000 MCG tablet Take  1,000 mcg by mouth daily.    . Vitamin D , Ergocalciferol , (DRISDOL ) 1.25 MG (50000 UNIT) CAPS capsule TAKE 1 CAPSULE EVERY 7 DAYS 12 capsule 3   No current facility-administered medications for this visit.    OBJECTIVE: White woman who appears well  Vitals:   10/20/24 1212  BP: (!) 141/66  Pulse: 85  Resp: 16  Temp: 97.7 F (36.5 C)  SpO2:  98%     Body mass index is 33.49 kg/m.   Wt Readings from Last 3 Encounters:  10/20/24 195 lb 1.6 oz (88.5 kg)  05/05/24 197 lb (89.4 kg)  05/02/24 197 lb 9.6 oz (89.6 kg)      ECOG FS:1 - Symptomatic but completely ambulatory   Physical Exam Constitutional:      Appearance: Normal appearance.  Chest:     Comments: Bilateral breasts inspected.  No palpable masses or regional adenopathy. Musculoskeletal:        General: No swelling.     Cervical back: Normal range of motion and neck supple. No rigidity.  Lymphadenopathy:     Cervical: No cervical adenopathy.  Neurological:     Mental Status: She is alert.      LAB RESULTS:  CMP     Component Value Date/Time   NA 139 04/08/2024 0728   NA 140 04/11/2023 0000   K 3.8 04/08/2024 0728   CL 101 04/08/2024 0728   CO2 29 04/08/2024 0728   GLUCOSE 105 (H) 04/08/2024 0728   GLUCOSE 94 12/31/2006 1033   BUN 21 04/08/2024 0728   BUN 19 04/11/2023 0000   CREATININE 0.66 04/08/2024 0728   CREATININE 0.73 09/06/2021 0909   CREATININE 0.61 10/22/2020 1006   CALCIUM  10.0 04/08/2024 0728   PROT 7.3 04/08/2024 0728   ALBUMIN 4.9 04/08/2024 0728   AST 29 04/08/2024 0728   AST 22 09/06/2021 0909   ALT 32 04/08/2024 0728   ALT 23 09/06/2021 0909   ALKPHOS 73 04/08/2024 0728   BILITOT 0.7 04/08/2024 0728   BILITOT 0.7 09/06/2021 0909   GFRNONAA >60 12/06/2021 1720   GFRNONAA >60 09/06/2021 0909   GFRAA >60 02/05/2020 0917    No results found for: TOTALPROTELP, ALBUMINELP, A1GS, A2GS, BETS, BETA2SER, GAMS, MSPIKE, SPEI  Lab Results  Component Value Date   WBC  6.2 01/07/2024   NEUTROABS 3.3 01/07/2024   HGB 14.8 01/07/2024   HCT 44.2 01/07/2024   MCV 89.2 01/07/2024   PLT 301.0 01/07/2024    No results found for: LABCA2  No components found for: [id number]  No results for input(s): INR in the last 168 hours.  No results found for: LABCA2  No results found for: RJW800  No results found for: CAN125  No results found for: CAN153  No results found for: CA2729  No components found for: HGQUANT  No results found for: CEA1, CEA / No results found for: CEA1, CEA   No results found for: AFPTUMOR  No results found for: CHROMOGRNA  No results found for: KPAFRELGTCHN, LAMBDASER, KAPLAMBRATIO (kappa/lambda light chains)  No results found for: HGBA, HGBA2QUANT, HGBFQUANT, HGBSQUAN (Hemoglobinopathy evaluation)   No results found for: LDH  Lab Results  Component Value Date   IRON 101 09/06/2010   IRONPCTSAT 18.3 (L) 09/06/2010   (Iron and TIBC)  No results found for: FERRITIN  Urinalysis    Component Value Date/Time   COLORURINE YELLOW 02/01/2020 1247   APPEARANCEUR CLEAR 02/01/2020 1247   LABSPEC 1.010 02/01/2020 1247   PHURINE 7.5 02/01/2020 1247   GLUCOSEU NEGATIVE 02/01/2020 1247   GLUCOSEU NEGATIVE 05/08/2012 0811   HGBUR SMALL (A) 02/01/2020 1247   HGBUR large 09/19/2010 1245   BILIRUBINUR neg 05/08/2023 1551   KETONESUR NEGATIVE 02/01/2020 1247   PROTEINUR Negative 05/08/2023 1551   PROTEINUR NEGATIVE 02/01/2020 1247   UROBILINOGEN 0.2 05/08/2023 1551   UROBILINOGEN 0.2 01/05/2015 1143   NITRITE neg 05/08/2023 1551   NITRITE NEGATIVE  02/01/2020 1247   LEUKOCYTESUR Negative 05/08/2023 1551   LEUKOCYTESUR NEGATIVE 02/01/2020 1247    STUDIES: No results found.    ELIGIBLE FOR AVAILABLE RESEARCH PROTOCOL: no  ASSESSMENT: 58 y.o. 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, with a positive  lateral margin  (a) successful additional surgery for margin clearance 12/24/2019  (1) adjuvant radiation 01/21/2020 - 02/18/2020  (2) anastrozole  prescribed March 2021, never started by patient  (3) genetics testing 11/28/2019 at [facility] through the Common Hereditary Cancers Panel test by [facility] showed no deleterious mutations in APC, ATM, AXIN2, BARD1, BMPR1A, BRCA1, BRCA2, BRIP1, CDH1, CDK4, CDKN2A, CHEK2, CTNNA1, DICER1, EPCAM, GREM1, HOXB13, KIT, MEN1, MLH1, MSH2, MSH3, MSH6, MUTYH, NBN, NF1, NTHL1, PALB2, PDGFRA, PMS2, POLD1, POLE, PTEN, RAD50, RAD51C, RAD51D, SDHA, SDHB, SDHC, SDHD, SMAD4, SMARCA4, STK11, TP53, TSC1, TSC2, VHL.   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 Mammogram from September 13, 2023, breast density B, no evidence of malignancy. No concerns on physical exam today.  At this time I have recommended that she continue screening mammograms and return to clinic in 1 year.  Self breast exam recommended, she was to contact us  if any new breast changes happened.  She expressed understanding of the recommendations.  Advised regular exercise, healthy diet, good amount of sleep.   Amber Stalls, MD   10/20/2024 12:16 PM Medical Oncology and Hematology [facility] [address] [place], [place] [address] Tel. [phone]    Fax. [phone]   *Total Encounter Time as defined by the Centers for Medicare and Medicaid Services includes, in addition to the face-to-face time of a patient visit (documented in the note above) non-face-to-face time: obtaining and reviewing outside history, ordering and reviewing medications, tests or procedures, care coordination (communications with other health care  professionals or caregivers) and documentation in the medical record.

## 2024-10-20 NOTE — Progress Notes (Signed)
 [facility]  [phone] [phone]     ID: [name] DOB: March 05, 1966  MR#: [id number]  RDW#:[id number]  Patient Care Team: Antonio Meth, Jamee SAUNDERS, DO as PCP - General Bobbitt, Elgin Pepper, MD as Consulting Physician (Allergy and Immunology) Vanderbilt Ned, MD as Consulting Physician (General Surgery) Pyrtle, Gordy HERO, MD as Consulting Physician (Gastroenterology) Shona Layman BROCKS, MD as Consulting Physician (Urology) Tyree Nanetta SAILOR, RN as Oncology Nurse Navigator Izell Domino, MD as Attending Physician (Radiation Oncology) Rosemarie Eather RAMAN, MD as Consulting Physician (Neurology) Marne Kelly Nest, MD as Consulting Physician (Obstetrics and Gynecology) Amber Stalls, MD OTHER MD:  CHIEF COMPLAINT: noninvasive breast cancer, estrogen receptor positive  CURRENT TREATMENT:    INTERVAL HISTORY: Discussed the use of AI scribe software for clinical note transcription with the patient, who gave verbal consent to proceed.  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  Diagnosed with breast cancer in situ in 2020, she has been under regular follow-up. A recent mammogram in September was normal, and she has not noticed any changes in her breasts. It has been five years since her diagnosis.  She experiences a persistent rash of varying severity that is widespread and unresolved by current medications, including immunosuppressants and allergy medications such as Singulair . She uses Pepcid when the rash is severe. Despite being on a cleanse diet to identify potential allergens, the rash persists. No recent stress is contributing to the rash.  Her current medications include Singulair  and Pepcid, which she takes to manage her symptoms. Without these medications, her symptoms worsen significantly.  No new breast changes or stress-related factors  contributing to her rash.     COVID 19 VACCINATION STATUS: Moderna x2; infection 03/2021   HISTORY OF CURRENT ILLNESS: From the original intake note:  Lindsey [name] had routine screening mammography on 10/08/2017 showing a possible abnormality in the left breast. She underwent left diagnostic mammography with tomography and left breast ultrasonography at [facility] on 10/17/2017 showing: breast density category B; 9 mm indeterminate left breast mass without sonographic correlate. She was scheduled to undergo biopsy of the left breast mass on 10/22/2017, but the mass was not clearly seen on stereotactic imaging. Left diagnostic mammogram was performed instead and showed the mass as smaller and less conspicuous. Short term follow up was recommended.   She underwent left diagnostic mammogram and left breast ultrasonography on 01/23/2018 and bilateral diagnostic mammogram on 10/11/2018. When she returned for follow up bilateral diagnostic mammogram on 10/15/2019, this showed: breast density category B; new 6 mm group of calcifications within the upper-outer left breast; interval increase in size of lobular mass within the 12 o'clock position. Left breast ultrasonography performed that day measured the 12 o'clock left breast mass at 8 mm and showed no left axillary adenopathy.  Accordingly on 10/17/2019 she proceeded to biopsy of the left breast areas in question. 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   Pathology from the left 12 o'clock mass was negative for malignancy, showing fibrocystic changes with apocrine metaplasia. However, the biopsy clip was found to be located posterior to the mammographically identified mass, so she  proceeded to repeat biopsy of the area on 10/30/2019.  Pathology [id number]) was again benign, showing fibrocystic changes and fat necrosis.  She opted to proceed with left breast lumpectomy on 12/02/2019 under Dr. Vanderbilt. Pathology from the procedure ([id number]) showed: intermediate grade ductal carcinoma in situ, 0.6 cm; cauterized carcinoma in situ focally present at the lateral margin. Prognostic indicators significant for: estrogen receptor 90% positive with weak staining intensity, and progesterone  receptor 100% positive with strong staining intensity.  The patient's subsequent history is as detailed below.   PAST MEDICAL HISTORY: Past Medical History:  Diagnosis Date   Allergy    Anxiety    Breast cancer (HCC)    Cancer (HCC)    Colitis    Crohn's disease (HCC)    Depression    Diverticulosis    Hyperlipidemia    Hypertension    Mild intermittent asthma    rarely uses inhaler   Nephrolithiasis    Personal history of radiation therapy    PONV (postoperative nausea and vomiting)    Stroke (HCC)     PAST SURGICAL HISTORY: Past Surgical History:  Procedure Laterality Date   BREAST BIOPSY Left 10/17/2019   BREAST LUMPECTOMY Left 12/02/2019   BREAST LUMPECTOMY WITH RADIOACTIVE SEED LOCALIZATION Left 12/02/2019   Procedure: LEFT BREAST LUMPECTOMY WITH RADIOACTIVE SEED LOCALIZATION;  Surgeon: Vanderbilt Ned, MD;  Location: [facility];  Service: General;  Laterality: Left;   COMBINED HYSTEROSCOPY DIAGNOSTIC / D&C     CYSTOSCOPY W/ URETEROSCOPY W/ LITHOTRIPSY     dec 2022 and jan 2023,  [facility]   IR ANGIO INTRA EXTRACRAN SEL COM CAROTID INNOMINATE BILAT MOD SED  02/10/2020   IR ANGIO VERTEBRAL SEL VERTEBRAL BILAT MOD SED  02/10/2020   IR US  GUIDE VASC ACCESS RIGHT  02/10/2020   LEEP     LITHOTRIPSY     Multiple   RE-EXCISION OF BREAST LUMPECTOMY Left 12/24/2019   Procedure: RE-EXCISION OF LEFT BREAST LUMPECTOMY;  Surgeon: Vanderbilt Ned, MD;  Location:  [facility];  Service: General;  Laterality: Left;   RENAL ARTERY STENT Left    WISDOM TOOTH EXTRACTION      FAMILY HISTORY: Family History  Problem Relation Age of Onset   Allergic rhinitis Mother    Food Allergy Mother        peanut   Dementia Father    Alzheimer's disease Father    Asthma Brother    Colon cancer Paternal Grandmother    Breast cancer Cousin    Hyperlipidemia Other    Hypertension Other    Diabetes Other    Crohn's disease Neg Hx    Esophageal cancer Neg Hx    Stomach cancer Neg Hx    Rectal cancer Neg Hx    Angioedema Neg Hx    Eczema Neg Hx    Immunodeficiency Neg Hx    Urticaria Neg Hx   The patient's father died from Alzheimer's disease at age 50.  The patient's mother is 5 years old as of December 2020.  The patient denies a family hx of ovarian cancer. She reports breast cancer in a maternal cousin at age 68 (recurrent stage IV in her late 61s) and in a maternal great aunt. She also notes uterine cancer in her maternal grandmother, bladder cancer in an uncle, and colon cancer in her paternal grandmother.  The patient has 2 sisters, no brothers   GYNECOLOGIC HISTORY:  No LMP recorded. (Menstrual status: Perimenopausal). Menarche: 58 years old Age at first live birth: 58 years old GX P 3 LMP current,  irregular, about every 2 months Contraceptive: Barrier methods HRT no Hysterectomy? no BSO?  No   SOCIAL HISTORY: (updated 11/2019)  Naveyah is currently working at the [employer].  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  Her husband India works at [employer].  The patient has 3 children, Joane 36 teaches physical education in [place] , Ozell 17 and Johnny 15 are at home.  India has a child Kristin from an earlier marriage.  She is 26     ADVANCED DIRECTIVES: In the absence of any documents to the contrary the patient's husband is her healthcare power of attorney   HEALTH  MAINTENANCE: Social History   Tobacco Use   Smoking status: Former    Current packs/day: 0.00    Average packs/day: 1 pack/day for 10.0 years (10.0 ttl pk-yrs)    Types: Cigarettes    Start date: 12/25/1985    Quit date: 12/26/1995    Years since quitting: 28.8   Smokeless tobacco: Never  Vaping Use   Vaping status: Never Used  Substance Use Topics   Alcohol use: Not Currently    Comment: occasional wine    Drug use: No     Colonoscopy: 12/2018, repeat in 5 years  PAP: Per Dr. Rosalynn  Bone density: Remote   Allergies  Allergen Reactions   Azithromycin  Hives   Nitrofurantoin Other (See Comments)    abd pain   Tamiflu [Oseltamivir Phosphate] Hives    Current Outpatient Medications  Medication Sig Dispense Refill   adalimumab  (HUMIRA , 2 PEN,) 40 MG/0.4ML pen INJECT 40 MG (1 PEN) UNDER THE SKIN EVERY 14 DAYS 2 each 5   Adalimumab -adbm, 2 Pen, 40 MG/0.4ML AJKT INJECT THE CONTENTS OF 1 PEN (40 MG) UNDER THE SKIN EVERY 14 DAYS 6 each 0   albuterol  (VENTOLIN  HFA) 108 (90 Base) MCG/ACT inhaler Inhale 1-2 puffs into the lungs every 4 (four) hours as needed for wheezing or shortness of breath. 18 g 3   amLODipine  (NORVASC ) 5 MG tablet TAKE 1 TABLET DAILY 90 tablet 1   augmented betamethasone dipropionate (DIPROLENE-AF) 0.05 % ointment Apply 1 Application topically daily.     desloratadine  (CLARINEX ) 5 MG tablet Take 5 mg by mouth daily.   4   ezetimibe  (ZETIA ) 10 MG tablet TAKE 1 TABLET DAILY 90 tablet 3   FLUoxetine  (PROZAC ) 20 MG capsule TAKE 1 CAPSULE DAILY 90 capsule 1   fluticasone  (FLONASE ) 50 MCG/ACT nasal spray Place 1 spray into both nostrils daily.     fluticasone  (FLOVENT  HFA) 110 MCG/ACT inhaler Inhale 2 puffs into the lungs 2 (two) times daily. 1 each 0   hydrocortisone 2.5 % ointment Apply 1 Application topically as needed.     indapamide  (LOZOL ) 1.25 MG tablet Take 1 tablet (1.25 mg total) by mouth daily. 90 tablet 3   levocetirizine (XYZAL ) 5 MG tablet Take 1 tablet (5 mg  total) by mouth every evening. 30 tablet 0   metFORMIN (GLUCOPHAGE-XR) 500 MG 24 hr tablet Take 500 mg by mouth 2 (two) times daily.     montelukast  (SINGULAIR ) 10 MG tablet Take 1 tablet (10 mg total) by mouth at bedtime. 30 tablet 0   omega-3 acid ethyl esters (LOVAZA ) 1 g capsule Take by mouth.     OVER THE COUNTER MEDICATION Take 7 drops by mouth 2 (two) times daily. CBD oil     POTASSIUM CITRATE  PO Take 108 mg by mouth 2 (two) times daily.  rosuvastatin  (CRESTOR ) 10 MG tablet Take 2 tablets (20 mg total) by mouth daily. 180 tablet 0   vitamin B-12 (CYANOCOBALAMIN) 1000 MCG tablet Take 1,000 mcg by mouth daily.     Vitamin D , Ergocalciferol , (DRISDOL ) 1.25 MG (50000 UNIT) CAPS capsule TAKE 1 CAPSULE EVERY 7 DAYS 12 capsule 3   No current facility-administered medications for this visit.    OBJECTIVE: White woman who appears well  Vitals:   10/20/24 1212  BP: (!) 141/66  Pulse: 85  Resp: 16  Temp: 97.7 F (36.5 C)  SpO2: 98%     Body mass index is 33.49 kg/m.   Wt Readings from Last 3 Encounters:  10/20/24 195 lb 1.6 oz (88.5 kg)  05/05/24 197 lb (89.4 kg)  05/02/24 197 lb 9.6 oz (89.6 kg)      ECOG FS:1 - Symptomatic but completely ambulatory   Physical Exam Constitutional:      Appearance: Normal appearance.  Chest:     Comments: Bilateral breasts inspected.  No palpable masses or regional adenopathy. Musculoskeletal:        General: No swelling.     Cervical back: Normal range of motion and neck supple. No rigidity.  Lymphadenopathy:     Cervical: No cervical adenopathy.  Neurological:     Mental Status: She is alert.      LAB RESULTS:  CMP     Component Value Date/Time   NA 139 04/08/2024 0728   NA 140 04/11/2023 0000   K 3.8 04/08/2024 0728   CL 101 04/08/2024 0728   CO2 29 04/08/2024 0728   GLUCOSE 105 (H) 04/08/2024 0728   GLUCOSE 94 12/31/2006 1033   BUN 21 04/08/2024 0728   BUN 19 04/11/2023 0000   CREATININE 0.66 04/08/2024 0728    CREATININE 0.73 09/06/2021 0909   CREATININE 0.61 10/22/2020 1006   CALCIUM  10.0 04/08/2024 0728   PROT 7.3 04/08/2024 0728   ALBUMIN 4.9 04/08/2024 0728   AST 29 04/08/2024 0728   AST 22 09/06/2021 0909   ALT 32 04/08/2024 0728   ALT 23 09/06/2021 0909   ALKPHOS 73 04/08/2024 0728   BILITOT 0.7 04/08/2024 0728   BILITOT 0.7 09/06/2021 0909   GFRNONAA >60 12/06/2021 1720   GFRNONAA >60 09/06/2021 0909   GFRAA >60 02/05/2020 0917    No results found for: TOTALPROTELP, ALBUMINELP, A1GS, A2GS, BETS, BETA2SER, GAMS, MSPIKE, SPEI  Lab Results  Component Value Date   WBC 6.2 01/07/2024   NEUTROABS 3.3 01/07/2024   HGB 14.8 01/07/2024   HCT 44.2 01/07/2024   MCV 89.2 01/07/2024   PLT 301.0 01/07/2024    No results found for: LABCA2  No components found for: OJARJW874  No results for input(s): INR in the last 168 hours.  No results found for: LABCA2  No results found for: RJW800  No results found for: CAN125  No results found for: CAN153  No results found for: CA2729  No components found for: HGQUANT  No results found for: CEA1, CEA / No results found for: CEA1, CEA   No results found for: AFPTUMOR  No results found for: CHROMOGRNA  No results found for: KPAFRELGTCHN, LAMBDASER, KAPLAMBRATIO (kappa/lambda light chains)  No results found for: HGBA, HGBA2QUANT, HGBFQUANT, HGBSQUAN (Hemoglobinopathy evaluation)   No results found for: LDH  Lab Results  Component Value Date   IRON 101 09/06/2010   IRONPCTSAT 18.3 (L) 09/06/2010   (Iron and TIBC)  No results found for: FERRITIN  Urinalysis    Component Value  Date/Time   COLORURINE YELLOW 02/01/2020 1247   APPEARANCEUR CLEAR 02/01/2020 1247   LABSPEC 1.010 02/01/2020 1247   PHURINE 7.5 02/01/2020 1247   GLUCOSEU NEGATIVE 02/01/2020 1247   GLUCOSEU NEGATIVE 05/08/2012 0811   HGBUR SMALL (A) 02/01/2020 1247   HGBUR large 09/19/2010 1245    BILIRUBINUR neg 05/08/2023 1551   KETONESUR NEGATIVE 02/01/2020 1247   PROTEINUR Negative 05/08/2023 1551   PROTEINUR NEGATIVE 02/01/2020 1247   UROBILINOGEN 0.2 05/08/2023 1551   UROBILINOGEN 0.2 01/05/2015 1143   NITRITE neg 05/08/2023 1551   NITRITE NEGATIVE 02/01/2020 1247   LEUKOCYTESUR Negative 05/08/2023 1551   LEUKOCYTESUR NEGATIVE 02/01/2020 1247    STUDIES: No results found.    ELIGIBLE FOR AVAILABLE RESEARCH PROTOCOL: no  ASSESSMENT: 58 y.o. 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, with a positive lateral margin  (a) successful additional surgery for margin clearance 12/24/2019  (1) adjuvant radiation 01/21/2020 - 02/18/2020  (2) anastrozole  prescribed March 2021, never started by patient  (3) genetics testing 11/28/2019 at [facility] through the Common Hereditary Cancers Panel test by [facility] showed no deleterious mutations in APC, ATM, AXIN2, BARD1, BMPR1A, BRCA1, BRCA2, BRIP1, CDH1, CDK4, CDKN2A, CHEK2, CTNNA1, DICER1, EPCAM, GREM1, HOXB13, KIT, MEN1, MLH1, MSH2, MSH3, MSH6, MUTYH, NBN, NF1, NTHL1, PALB2, PDGFRA, PMS2, POLD1, POLE, PTEN, RAD50, RAD51C, RAD51D, SDHA, SDHB, SDHC, SDHD, SMAD4, SMARCA4, STK11, TP53, TSC1, TSC2, VHL.   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 Five years post-diagnosis with no new breast changes. Normal mammogram in September.  Low risk for recurrence, no concerns on exam today - Continue regular screening mammograms.  Chronic pruritic rash of unclear etiology Persistent rash despite immunosuppressants and allergy medications. No definitive allergen identified. - Continue current allergy medications including Singulair . - Uses Pepcid for severe rash episodes. - Consider allergen elimination diet.   Amber Stalls, MD   10/20/2024 12:17  PM Medical Oncology and Hematology [facility] [address] [place], [place] [address] Tel. [phone]    Fax. [phone]  Time spent: 20 min   *Total Encounter Time as defined by the Centers for Medicare and Medicaid Services includes, in addition to the face-to-face time of a patient visit (documented in the note above) non-face-to-face time: obtaining and reviewing outside history, ordering and reviewing medications, tests or procedures, care coordination (communications with other health care professionals or caregivers) and documentation in the medical record.

## 2024-10-23 ENCOUNTER — Other Ambulatory Visit (HOSPITAL_COMMUNITY): Payer: Self-pay

## 2024-10-23 ENCOUNTER — Telehealth: Payer: Self-pay

## 2024-10-23 NOTE — Telephone Encounter (Signed)
 Inbound all from patient states she received a call from insurance that her PA was denied. Please advise.

## 2024-10-23 NOTE — Telephone Encounter (Signed)
 Pharmacy Patient Advocate Encounter   Received notification from Patient Insurance that prior authorization for Adalimumab -ryvk (2 Pen) 40MG /0.4ML auto-injector kit is required/requested.   Insurance verification completed.   The patient is insured through [facility].   Per test claim: PA required; PA submitted to above mentioned insurance via Latent Key/confirmation #/EOC [id number] Status is pending

## 2024-10-24 NOTE — Telephone Encounter (Signed)
 Dr Albertus- Please advise....  New insurance will not cover Adalimumab -ryvk. Unfortunately, they also do not indicate any alternatives that they may cover.  I have contacted patient to ask that she reach out to her insurance plan and discuss which options they would cover. She is to call us  back so we can come up with an alternative plan.

## 2024-10-24 NOTE — Telephone Encounter (Signed)
 Patient called back. She states per her HR department, new insurance prefers Amjevita  rx.  Can we change her over to this?

## 2024-10-24 NOTE — Telephone Encounter (Signed)
 Pharmacy Patient Advocate Encounter  Received notification from [employer] that Prior Authorization for  Adalimumab -ryvk (2 Pen) 40MG /0.4ML auto-injector kit  has been DENIED.  Full denial letter will be uploaded to the media tab. See denial reason below.  The requested drug is not covered by the plan.   PA #/Case ID/Reference #: [id number]

## 2024-10-24 NOTE — Telephone Encounter (Signed)
 Patient needs to continue adalimumab  therapy uninterrupted She should contact her insurance plan and determine if Humira  or another adalimumab  biosimilar is covered as you have advised What ever adalimumab  therapy is covered can be prescribed at the same dose and frequency

## 2024-10-24 NOTE — Telephone Encounter (Signed)
 Unfortunately not. What I put is the only thing on the denial letter for a reasoning.

## 2024-10-28 ENCOUNTER — Other Ambulatory Visit: Payer: Self-pay

## 2024-10-28 ENCOUNTER — Telehealth: Payer: Self-pay

## 2024-10-28 ENCOUNTER — Other Ambulatory Visit (HOSPITAL_COMMUNITY): Payer: Self-pay

## 2024-10-28 MED ORDER — AMJEVITA 40 MG/0.4ML ~~LOC~~ SOAJ: 40.0000 mg | SUBCUTANEOUS | 6 refills | Status: AC

## 2024-10-28 NOTE — Telephone Encounter (Signed)
 Pharmacy Patient Advocate Encounter  Received notification from [employer] that Prior Authorization for Amjevita  40MG /0.4ML auto-injectors has been APPROVED from 10-27-2024 to 04-27-2025  Must fill at [facility]   PA #/Case ID/Reference #: [id number]

## 2024-10-28 NOTE — Telephone Encounter (Signed)
 Pharmacy Patient Advocate Encounter   Received notification from Pt Calls Messages that prior authorization for Amjevita  40MG /0.4ML auto-injectors is required/requested.   Insurance verification completed.   The patient is insured through [facility]   Per test claim: PA required; PA submitted to above mentioned insurance via Latent Key/confirmation #/EOC [id number] Status is pending

## 2024-10-29 NOTE — Progress Notes (Unsigned)
 Chief Complaint: Follow-up Crohn's disease  HPI:    Lindsey [name] is a 58 year old female with a past medical history as listed below including Crohn's disease diagnosed in 2017 maintained on Humira  every 2 weeks and previously Mesalamine  4.8 g daily, breast cancer in remission, hypertension, hyperlipidemia, diabetes, obesity and multiple others,, known to Dr. Albertus, who presents to clinic today for follow-up of her Crohn's disease.    12/2022 colonoscopy norma, repeat recommended in 3 years.    01/16/2024 office visit with Jessica Zehr for follow-up.  At that time following up having last been seen 11/2022 with a flare of symptoms.  At that time we discussed switching medication to Skyrizi.  Fecal calprotectin was normal and Humira  drug levels and antibodies were fine.  Decided to continue current regimen.  At that visit she was having left-sided abdominal pain with altered bowel habits.    01/17/2024 fecal calprotectin normal.  At that time treated for SIBO with Xifaxan .    04/08/2024 quant to [facility] negative.  CMP with a glucose of 105 and otherwise normal.  Previous GI history: Colonoscopy 12/2022:   - The examined portion of the ileum was normal. - Simple Endoscopic Score for Crohn' s Disease: 0, mucosal inflammatory changes secondary to Crohn' s disease, in remission. - Mild diverticulosis in the sigmoid colon and in the descending colon. - Anal papilla( e) were hypertrophied. - Multiple biopsies were obtained in the entire colon ( cecum/ acending; transverse; descending/ sigmoid; rectum) .   1. Surgical [P], colon, ascending and cecum - BENIGN COLONIC MUCOSA WITHOUT DIAGNOSTIC ABNORMALITY - NEGATIVE FOR SIGNIFICANT CHRONIC CHANGES, ACTIVE INFLAMMATION, GRANULOMATA, DYSPLASIA OR MALIGNANCY 2. Surgical [P], colon, transverse - BENIGN COLONIC MUCOSA WITH LYMPHOID AGGREGATES - NEGATIVE FOR SIGNIFICANT CHRONIC CHANGES, ACTIVE INFLAMMATION, GRANULOMATA, DYSPLASIA OR MALIGNANCY 3.  Surgical [P], colon, sigmoid and descending - BENIGN COLONIC MUCOSA WITH LYMPHOID AGGREGATES - NEGATIVE FOR SIGNIFICANT CHRONIC CHANGES, ACTIVE INFLAMMATION, GRANULOMATA, DYSPLASIA OR MALIGNANCY 4. Surgical [P], colon, rectum - BENIGN COLONIC MUCOSA WITH LYMPHOID AGGREGATES - NEGATIVE FOR SIGNIFICANT CHRONIC CHANGES, ACTIVE INFLAMMATION, GRANULOMATA, DYSPLASIA OR MALIGNANCY   Past Medical History:  Diagnosis Date   Allergy    Anxiety    Breast cancer (HCC)    Cancer (HCC)    Colitis    Crohn's disease (HCC)    Depression    Diverticulosis    Hyperlipidemia    Hypertension    Mild intermittent asthma    rarely uses inhaler   Nephrolithiasis    Personal history of radiation therapy    PONV (postoperative nausea and vomiting)    Stroke [facility])     Past Surgical History:  Procedure Laterality Date   BREAST BIOPSY Left 10/17/2019   BREAST LUMPECTOMY Left 12/02/2019   BREAST LUMPECTOMY WITH RADIOACTIVE SEED LOCALIZATION Left 12/02/2019   Procedure: LEFT BREAST LUMPECTOMY WITH RADIOACTIVE SEED LOCALIZATION;  Surgeon: Vanderbilt Ned, MD;  Location: [facility];  Service: General;  Laterality: Left;   COMBINED HYSTEROSCOPY DIAGNOSTIC / D&C     CYSTOSCOPY W/ URETEROSCOPY W/ LITHOTRIPSY     dec 2022 and jan 2023,  [facility]   IR ANGIO INTRA EXTRACRAN SEL COM CAROTID INNOMINATE BILAT MOD SED  02/10/2020   IR ANGIO VERTEBRAL SEL VERTEBRAL BILAT MOD SED  02/10/2020   IR US  GUIDE VASC ACCESS RIGHT  02/10/2020   LEEP     LITHOTRIPSY     Multiple   RE-EXCISION OF BREAST LUMPECTOMY Left 12/24/2019   Procedure: RE-EXCISION OF LEFT BREAST  LUMPECTOMY;  Surgeon: Vanderbilt Ned, MD;  Location: [facility];  Service: General;  Laterality: Left;   RENAL ARTERY STENT Left    WISDOM TOOTH EXTRACTION      Current Outpatient Medications  Medication Sig Dispense Refill   Adalimumab -atto (AMJEVITA ) 40 MG/0.4ML SOAJ Inject 40 mg into the skin every 14 (fourteen) days. 0.4  mL 6   albuterol  (VENTOLIN  HFA) 108 (90 Base) MCG/ACT inhaler Inhale 1-2 puffs into the lungs every 4 (four) hours as needed for wheezing or shortness of breath. 18 g 3   amLODipine  (NORVASC ) 5 MG tablet TAKE 1 TABLET DAILY 90 tablet 1   augmented betamethasone dipropionate (DIPROLENE-AF) 0.05 % ointment Apply 1 Application topically daily.     desloratadine  (CLARINEX ) 5 MG tablet Take 5 mg by mouth daily.   4   ezetimibe  (ZETIA ) 10 MG tablet TAKE 1 TABLET DAILY 90 tablet 3   FLUoxetine  (PROZAC ) 20 MG capsule TAKE 1 CAPSULE DAILY 90 capsule 1   fluticasone  (FLONASE ) 50 MCG/ACT nasal spray Place 1 spray into both nostrils daily.     fluticasone  (FLOVENT  HFA) 110 MCG/ACT inhaler Inhale 2 puffs into the lungs 2 (two) times daily. 1 each 0   hydrocortisone 2.5 % ointment Apply 1 Application topically as needed.     indapamide  (LOZOL ) 1.25 MG tablet Take 1 tablet (1.25 mg total) by mouth daily. 90 tablet 3   levocetirizine (XYZAL ) 5 MG tablet Take 1 tablet (5 mg total) by mouth every evening. 30 tablet 0   metFORMIN (GLUCOPHAGE-XR) 500 MG 24 hr tablet Take 500 mg by mouth 2 (two) times daily.     montelukast  (SINGULAIR ) 10 MG tablet Take 1 tablet (10 mg total) by mouth at bedtime. 30 tablet 0   omega-3 acid ethyl esters (LOVAZA ) 1 g capsule Take by mouth.     OVER THE COUNTER MEDICATION Take 7 drops by mouth 2 (two) times daily. CBD oil     POTASSIUM CITRATE  PO Take 108 mg by mouth 2 (two) times daily.     rosuvastatin  (CRESTOR ) 10 MG tablet Take 2 tablets (20 mg total) by mouth daily. 180 tablet 0   vitamin B-12 (CYANOCOBALAMIN) 1000 MCG tablet Take 1,000 mcg by mouth daily.     Vitamin D , Ergocalciferol , (DRISDOL ) 1.25 MG (50000 UNIT) CAPS capsule TAKE 1 CAPSULE EVERY 7 DAYS 12 capsule 3   No current facility-administered medications for this visit.    Allergies as of 10/30/2024 - Review Complete 05/05/2024  Allergen Reaction Noted   Azithromycin  Hives 12/12/2016   Nitrofurantoin Other (See  Comments) 05/05/2015   Tamiflu [oseltamivir phosphate] Hives 02/18/2019    Family History  Problem Relation Age of Onset   Allergic rhinitis Mother    Food Allergy Mother        peanut   Dementia Father    Alzheimer's disease Father    Asthma Brother    Colon cancer Paternal Grandmother    Breast cancer Cousin    Hyperlipidemia Other    Hypertension Other    Diabetes Other    Crohn's disease Neg Hx    Esophageal cancer Neg Hx    Stomach cancer Neg Hx    Rectal cancer Neg Hx    Angioedema Neg Hx    Eczema Neg Hx    Immunodeficiency Neg Hx    Urticaria Neg Hx     Social History   Socioeconomic History   Marital status: Married    Spouse name: Not on file  Number of children: Not on file   Years of education: Not on file   Highest education level: Not on file  Occupational History   Not on file  Tobacco Use   Smoking status: Former    Current packs/day: 0.00    Average packs/day: 1 pack/day for 10.0 years (10.0 ttl pk-yrs)    Types: Cigarettes    Start date: 12/25/1985    Quit date: 12/26/1995    Years since quitting: 28.8   Smokeless tobacco: Never  Vaping Use   Vaping status: Never Used  Substance and Sexual Activity   Alcohol use: Not Currently    Comment: occasional wine    Drug use: No   Sexual activity: Yes  Other Topics Concern   Not on file  Social History Narrative   Occupation: Babysit   Married   Regular exercise- yes   Social Drivers of Corporate Investment Banker Strain: Low Risk  (12/31/2023)   Received from [facility]   Overall Financial Resource Strain (CARDIA)    Difficulty of Paying Living Expenses: Not hard at all  Food Insecurity: No Food Insecurity (12/31/2023)   Received from [facility]   Hunger Vital Sign    Within the past 12 months, you worried that your food would run out before you got the money to buy more.: Never true    Within the past 12 months, the food you bought just didn't last and you didn't have money to get more.:  Never true  Transportation Needs: No Transportation Needs (12/31/2023)   Received from [facility] - Transportation    Lack of Transportation (Medical): No    Lack of Transportation (Non-Medical): No  Physical Activity: Not on file  Stress: Not on file  Social Connections: Unknown (05/08/2022)   Received from [facility]   Social Network    Social Network: Not on file  Intimate Partner Violence: Unknown (03/30/2022)   Received from [facility]   HITS    Physically Hurt: Not on file    Insult or Talk Down To: Not on file    Threaten Physical Harm: Not on file    Scream or Curse: Not on file    Review of Systems:    Constitutional: No weight loss, fever, chills, weakness or fatigue HEENT: Eyes: No change in vision               Ears, Nose, Throat:  No change in hearing or congestion Skin: No rash or itching Cardiovascular: No chest pain, chest pressure or palpitations   Respiratory: No SOB or cough Gastrointestinal: See HPI and otherwise negative Genitourinary: No dysuria or change in urinary frequency Neurological: No headache, dizziness or syncope Musculoskeletal: No new muscle or joint pain Hematologic: No bleeding or bruising Psychiatric: No history of depression or anxiety    Physical Exam:  Vital signs: There were no vitals taken for this visit.  Constitutional:   Pleasant Caucasian female appears to be in NAD, Well developed, Well nourished, alert and cooperative Head:  Normocephalic and atraumatic. Eyes:   PEERL, EOMI. No icterus. Conjunctiva pink. Ears:  Normal auditory acuity. Neck:  Supple Throat: Oral cavity and pharynx without inflammation, swelling or lesion.  Respiratory: Respirations even and unlabored. Lungs clear to auscultation bilaterally.   No wheezes, crackles, or rhonchi.  Cardiovascular: Normal S1, S2. No MRG. Regular rate and rhythm. No peripheral edema, cyanosis or pallor.  Gastrointestinal:  Soft, nondistended, nontender. No rebound  or guarding. Normal bowel  sounds. No appreciable masses or hepatomegaly. Rectal:  Not performed.  Msk:  Symmetrical without gross deformities. Without edema, no deformity or joint abnormality.  Neurologic:  Alert and  oriented x4;  grossly normal neurologically.  Skin:   Dry and intact without significant lesions or rashes. Psychiatric: Oriented to person, place and time. Demonstrates good judgement and reason without abnormal affect or behaviors.  RELEVANT LABS AND IMAGING: CBC    Component Value Date/Time   WBC 6.2 01/07/2024 0743   RBC 4.95 01/07/2024 0743   HGB 14.8 01/07/2024 0743   HGB 14.6 09/06/2021 0909   HCT 44.2 01/07/2024 0743   PLT 301.0 01/07/2024 0743   PLT 265 09/06/2021 0909   MCV 89.2 01/07/2024 0743   MCH 29.8 12/06/2021 1720   MCHC 33.5 01/07/2024 0743   RDW 13.7 01/07/2024 0743   LYMPHSABS 2.0 01/07/2024 0743   MONOABS 0.5 01/07/2024 0743   EOSABS 0.4 01/07/2024 0743   BASOSABS 0.1 01/07/2024 0743    CMP     Component Value Date/Time   NA 139 04/08/2024 0728   NA 140 04/11/2023 0000   K 3.8 04/08/2024 0728   CL 101 04/08/2024 0728   CO2 29 04/08/2024 0728   GLUCOSE 105 (H) 04/08/2024 0728   GLUCOSE 94 12/31/2006 1033   BUN 21 04/08/2024 0728   BUN 19 04/11/2023 0000   CREATININE 0.66 04/08/2024 0728   CREATININE 0.73 09/06/2021 0909   CREATININE 0.61 10/22/2020 1006   CALCIUM  10.0 04/08/2024 0728   PROT 7.3 04/08/2024 0728   ALBUMIN 4.9 04/08/2024 0728   AST 29 04/08/2024 0728   AST 22 09/06/2021 0909   ALT 32 04/08/2024 0728   ALT 23 09/06/2021 0909   ALKPHOS 73 04/08/2024 0728   BILITOT 0.7 04/08/2024 0728   BILITOT 0.7 09/06/2021 0909   GFRNONAA >60 12/06/2021 1720   GFRNONAA >60 09/06/2021 0909   GFRAA >60 02/05/2020 0917    Assessment: 1.  Crohn's colitis: Diagnosed in 2017 maintained on Humira  every 2 weeks and previously on Mesalamine , last colonoscopy January 2024 with biopsies showing remission  Plan: 1.  CBC, CMP, fecal  calprotectin, ESR and CRP for baseline 2.  Continue Humira  40 mg every 2 weeks     Delon Failing, PA-C [facility] 10/29/2024, 10:19 AM  Cc: Antonio Meth, Jamee SAUNDERS, *

## 2024-10-30 ENCOUNTER — Other Ambulatory Visit

## 2024-10-30 ENCOUNTER — Ambulatory Visit: Payer: Self-pay | Admitting: Physician Assistant

## 2024-10-30 ENCOUNTER — Encounter: Payer: Self-pay | Admitting: Physician Assistant

## 2024-10-30 ENCOUNTER — Ambulatory Visit: Admitting: Physician Assistant

## 2024-10-30 VITALS — BP 122/68 | HR 64 | Ht 64.0 in | Wt 192.0 lb

## 2024-10-30 DIAGNOSIS — K5010 Crohn's disease of large intestine without complications: Secondary | ICD-10-CM

## 2024-10-30 LAB — CBC WITH DIFFERENTIAL/PLATELET
Basophils Absolute: 0.1 K/uL (ref 0.0–0.1)
Basophils Relative: 1 % (ref 0.0–3.0)
Eosinophils Absolute: 0.2 K/uL (ref 0.0–0.7)
Eosinophils Relative: 3.5 % (ref 0.0–5.0)
HCT: 44.6 % (ref 36.0–46.0)
Hemoglobin: 15 g/dL (ref 12.0–15.0)
Lymphocytes Relative: 32.8 % (ref 12.0–46.0)
Lymphs Abs: 1.8 K/uL (ref 0.7–4.0)
MCHC: 33.6 g/dL (ref 30.0–36.0)
MCV: 87.2 fl (ref 78.0–100.0)
Monocytes Absolute: 0.6 K/uL (ref 0.1–1.0)
Monocytes Relative: 10.3 % (ref 3.0–12.0)
Neutro Abs: 3 K/uL (ref 1.4–7.7)
Neutrophils Relative %: 52.4 % (ref 43.0–77.0)
Platelets: 275 K/uL (ref 150.0–400.0)
RBC: 5.12 Mil/uL — ABNORMAL HIGH (ref 3.87–5.11)
RDW: 13.7 % (ref 11.5–15.5)
WBC: 5.6 K/uL (ref 4.0–10.5)

## 2024-10-30 LAB — IBC + FERRITIN
Ferritin: 31.1 ng/mL (ref 10.0–291.0)
Iron: 77 ug/dL (ref 42–145)
Saturation Ratios: 17.9 % — ABNORMAL LOW (ref 20.0–50.0)
TIBC: 429.8 ug/dL (ref 250.0–450.0)
Transferrin: 307 mg/dL (ref 212.0–360.0)

## 2024-10-30 LAB — COMPREHENSIVE METABOLIC PANEL WITH GFR
ALT: 24 U/L (ref 0–35)
AST: 28 U/L (ref 0–37)
Albumin: 4.6 g/dL (ref 3.5–5.2)
Alkaline Phosphatase: 65 U/L (ref 39–117)
BUN: 18 mg/dL (ref 6–23)
CO2: 29 meq/L (ref 19–32)
Calcium: 9.7 mg/dL (ref 8.4–10.5)
Chloride: 99 meq/L (ref 96–112)
Creatinine, Ser: 0.7 mg/dL (ref 0.40–1.20)
GFR: 95.55 mL/min (ref >60.00)
Glucose, Bld: 107 mg/dL — ABNORMAL HIGH (ref 70–99)
Potassium: 3.5 meq/L (ref 3.5–5.1)
Sodium: 139 meq/L (ref 135–145)
Total Bilirubin: 0.6 mg/dL (ref 0.2–1.2)
Total Protein: 7.7 g/dL (ref 6.0–8.3)

## 2024-10-30 LAB — B12 AND FOLATE PANEL
Folate: >23.7 ng/mL (ref >5.9)
Vitamin B-12: 1130 pg/mL — ABNORMAL HIGH (ref 211–911)

## 2024-10-30 LAB — C-REACTIVE PROTEIN: CRP: 0.5 mg/dL (ref 0.5–20.0)

## 2024-10-30 LAB — VITAMIN D 25 HYDROXY (VIT D DEFICIENCY, FRACTURES): VITD: 57.24 ng/mL (ref 30.00–100.00)

## 2024-10-30 LAB — SEDIMENTATION RATE: Sed Rate: 16 mm/h (ref 0–30)

## 2024-10-30 NOTE — Patient Instructions (Signed)
 Your provider has requested that you go to the basement level for lab work before leaving today. Press B on the elevator. The lab is located at the first door on the left as you exit the elevator.  _______________________________________________________  If your blood pressure at your visit was 140/90 or greater, please contact your primary care physician to follow up on this.  _______________________________________________________  If you are age 58 or older, your body mass index should be between 23-30. Your Body mass index is 32.96 kg/m. If this is out of the aforementioned range listed, please consider follow up with your Primary Care Provider.  If you are age 43 or younger, your body mass index should be between 19-25. Your Body mass index is 32.96 kg/m. If this is out of the aformentioned range listed, please consider follow up with your Primary Care Provider.   ________________________________________________________  The [facility] providers would like to encourage you to use MYCHART to communicate with providers for non-urgent requests or questions.  Due to long hold times on the telephone, sending your provider a message by [facility] may be a faster and more efficient way to get a response.  Please allow 48 business hours for a response.  Please remember that this is for non-urgent requests.  _______________________________________________________  [facility] is using a team-based approach to care.  Your team is made up of your doctor and two to three APPS. Our APPS (Nurse Practitioners and Physician Assistants) work with your physician to ensure care continuity for you. They are fully qualified to address your health concerns and develop a treatment plan. They communicate directly with your gastroenterologist to care for you. Seeing the Advanced Practice Practitioners on your physician's team can help you by facilitating care more promptly, often allowing for earlier  appointments, access to diagnostic testing, procedures, and other specialty referrals.

## 2024-11-06 ENCOUNTER — Other Ambulatory Visit

## 2024-11-06 DIAGNOSIS — K5010 Crohn's disease of large intestine without complications: Secondary | ICD-10-CM

## 2024-11-11 LAB — CALPROTECTIN, FECAL: Calprotectin, Fecal: 38 ug/g (ref 0–120)

## 2025-01-02 ENCOUNTER — Encounter: Payer: Self-pay | Admitting: Family Medicine

## 2025-01-02 ENCOUNTER — Other Ambulatory Visit: Payer: Self-pay | Admitting: Family Medicine

## 2025-01-02 MED ORDER — SCOPOLAMINE 1 MG/3DAYS TD PT72: 1.0000 | MEDICATED_PATCH | TRANSDERMAL | 12 refills | Status: AC

## 2025-01-05 ENCOUNTER — Other Ambulatory Visit: Payer: Self-pay | Admitting: Family Medicine

## 2025-01-05 ENCOUNTER — Telehealth: Payer: Self-pay | Admitting: Family Medicine

## 2025-01-05 DIAGNOSIS — E118 Type 2 diabetes mellitus with unspecified complications: Secondary | ICD-10-CM

## 2025-01-05 DIAGNOSIS — I10 Essential (primary) hypertension: Secondary | ICD-10-CM

## 2025-01-05 DIAGNOSIS — E785 Hyperlipidemia, unspecified: Secondary | ICD-10-CM

## 2025-01-05 NOTE — Telephone Encounter (Signed)
 Got her scheduled

## 2025-01-05 NOTE — Telephone Encounter (Signed)
 Pt has an appt scheduled for 01/19/25 to see dr.Lowne but wants to get blood work done before that appt so she can discuss her results during her appt. Please call pt if she can get these done before her appt.

## 2025-01-11 ENCOUNTER — Other Ambulatory Visit: Payer: Self-pay | Admitting: Family Medicine

## 2025-01-11 DIAGNOSIS — I10 Essential (primary) hypertension: Secondary | ICD-10-CM

## 2025-01-12 ENCOUNTER — Other Ambulatory Visit

## 2025-01-12 ENCOUNTER — Ambulatory Visit: Payer: Self-pay | Admitting: Family Medicine

## 2025-01-12 DIAGNOSIS — I10 Essential (primary) hypertension: Secondary | ICD-10-CM | POA: Diagnosis not present

## 2025-01-12 DIAGNOSIS — E118 Type 2 diabetes mellitus with unspecified complications: Secondary | ICD-10-CM

## 2025-01-12 DIAGNOSIS — E785 Hyperlipidemia, unspecified: Secondary | ICD-10-CM | POA: Diagnosis not present

## 2025-01-12 LAB — LIPID PANEL
Cholesterol: 201 mg/dL — ABNORMAL HIGH (ref 28–200)
HDL: 60.4 mg/dL
LDL Cholesterol: 119 mg/dL — ABNORMAL HIGH (ref 10–99)
NonHDL: 140.57
Total CHOL/HDL Ratio: 3
Triglycerides: 108 mg/dL (ref 10.0–149.0)
VLDL: 21.6 mg/dL (ref 0.0–40.0)

## 2025-01-12 LAB — CBC WITH DIFFERENTIAL/PLATELET
Basophils Absolute: 0.1 K/uL (ref 0.0–0.1)
Basophils Relative: 1.1 % (ref 0.0–3.0)
Eosinophils Absolute: 0.2 K/uL (ref 0.0–0.7)
Eosinophils Relative: 3.6 % (ref 0.0–5.0)
HCT: 40.5 % (ref 36.0–46.0)
Hemoglobin: 13.6 g/dL (ref 12.0–15.0)
Lymphocytes Relative: 41.6 % (ref 12.0–46.0)
Lymphs Abs: 2.4 K/uL (ref 0.7–4.0)
MCHC: 33.6 g/dL (ref 30.0–36.0)
MCV: 88.9 fl (ref 78.0–100.0)
Monocytes Absolute: 0.6 K/uL (ref 0.1–1.0)
Monocytes Relative: 9.5 % (ref 3.0–12.0)
Neutro Abs: 2.6 K/uL (ref 1.4–7.7)
Neutrophils Relative %: 44.2 % (ref 43.0–77.0)
Platelets: 270 K/uL (ref 150.0–400.0)
RBC: 4.56 Mil/uL (ref 3.87–5.11)
RDW: 14.4 % (ref 11.5–15.5)
WBC: 5.8 K/uL (ref 4.0–10.5)

## 2025-01-12 LAB — COMPREHENSIVE METABOLIC PANEL WITH GFR
ALT: 18 U/L (ref 3–35)
AST: 16 U/L (ref 5–37)
Albumin: 4.3 g/dL (ref 3.5–5.2)
Alkaline Phosphatase: 65 U/L (ref 39–117)
BUN: 18 mg/dL (ref 6–23)
CO2: 29 meq/L (ref 19–32)
Calcium: 9.6 mg/dL (ref 8.4–10.5)
Chloride: 103 meq/L (ref 96–112)
Creatinine, Ser: 0.65 mg/dL (ref 0.40–1.20)
GFR: 97.14 mL/min
Glucose, Bld: 101 mg/dL — ABNORMAL HIGH (ref 70–99)
Potassium: 4.6 meq/L (ref 3.5–5.1)
Sodium: 139 meq/L (ref 135–145)
Total Bilirubin: 0.5 mg/dL (ref 0.2–1.2)
Total Protein: 6.8 g/dL (ref 6.0–8.3)

## 2025-01-12 LAB — HEMOGLOBIN A1C: Hgb A1c MFr Bld: 5.9 % (ref 4.6–6.5)

## 2025-01-12 LAB — VITAMIN D 25 HYDROXY (VIT D DEFICIENCY, FRACTURES): VITD: 51.37 ng/mL (ref 30.00–100.00)

## 2025-01-12 LAB — TSH: TSH: 3.07 u[IU]/mL (ref 0.35–5.50)

## 2025-01-12 NOTE — Addendum Note (Signed)
 Addended by: MAREE HUM A on: 01/12/2025 07:23 AM   Modules accepted: Orders

## 2025-01-19 ENCOUNTER — Other Ambulatory Visit: Payer: Self-pay | Admitting: Family Medicine

## 2025-01-19 ENCOUNTER — Ambulatory Visit: Admitting: Family Medicine

## 2025-01-19 DIAGNOSIS — E785 Hyperlipidemia, unspecified: Secondary | ICD-10-CM

## 2025-01-22 NOTE — Telephone Encounter (Signed)
 Dr Bridgett Camps pt

## 2025-02-06 ENCOUNTER — Ambulatory Visit: Admitting: Internal Medicine

## 2025-02-10 ENCOUNTER — Ambulatory Visit: Admitting: Family Medicine

## 2025-10-20 ENCOUNTER — Inpatient Hospital Stay: Admitting: Hematology and Oncology
